# Patient Record
Sex: Female | Born: 1950 | Hispanic: No | Smoking: Former smoker
Health system: Southern US, Community
[De-identification: ages and names within clinical notes are randomized; demographics above are authoritative.]

## PROBLEM LIST (undated history)

## (undated) DIAGNOSIS — G459 Transient cerebral ischemic attack, unspecified: Secondary | ICD-10-CM

## (undated) DIAGNOSIS — K589 Irritable bowel syndrome without diarrhea: Secondary | ICD-10-CM

## (undated) DIAGNOSIS — I10 Essential (primary) hypertension: Secondary | ICD-10-CM

## (undated) DIAGNOSIS — E119 Type 2 diabetes mellitus without complications: Secondary | ICD-10-CM

## (undated) DIAGNOSIS — J45909 Unspecified asthma, uncomplicated: Secondary | ICD-10-CM

## (undated) HISTORY — DX: Transient cerebral ischemic attack, unspecified: G45.9

## (undated) HISTORY — PX: EXPLORATORY LAPAROTOMY: SUR591

---

## 2015-03-20 ENCOUNTER — Ambulatory Visit: Payer: Self-pay | Attending: Internal Medicine

## 2015-04-08 ENCOUNTER — Encounter (HOSPITAL_COMMUNITY): Payer: Self-pay

## 2015-04-08 ENCOUNTER — Emergency Department (HOSPITAL_COMMUNITY): Payer: Self-pay

## 2015-04-08 ENCOUNTER — Emergency Department (HOSPITAL_COMMUNITY)
Admission: EM | Admit: 2015-04-08 | Discharge: 2015-04-08 | Disposition: A | Discharge status: Home / Self Care | Payer: Self-pay | Attending: Emergency Medicine | Admitting: Emergency Medicine

## 2015-04-08 DIAGNOSIS — G44019 Episodic cluster headache, not intractable: Secondary | ICD-10-CM | POA: Insufficient documentation

## 2015-04-08 DIAGNOSIS — Z79899 Other long term (current) drug therapy: Secondary | ICD-10-CM | POA: Insufficient documentation

## 2015-04-08 DIAGNOSIS — R519 Headache, unspecified: Secondary | ICD-10-CM

## 2015-04-08 DIAGNOSIS — E119 Type 2 diabetes mellitus without complications: Secondary | ICD-10-CM | POA: Insufficient documentation

## 2015-04-08 DIAGNOSIS — R200 Anesthesia of skin: Secondary | ICD-10-CM

## 2015-04-08 DIAGNOSIS — R51 Headache: Secondary | ICD-10-CM

## 2015-04-08 DIAGNOSIS — I10 Essential (primary) hypertension: Secondary | ICD-10-CM | POA: Insufficient documentation

## 2015-04-08 DIAGNOSIS — Z7982 Long term (current) use of aspirin: Secondary | ICD-10-CM | POA: Insufficient documentation

## 2015-04-08 DIAGNOSIS — Z8719 Personal history of other diseases of the digestive system: Secondary | ICD-10-CM | POA: Insufficient documentation

## 2015-04-08 DIAGNOSIS — Z7951 Long term (current) use of inhaled steroids: Secondary | ICD-10-CM | POA: Insufficient documentation

## 2015-04-08 HISTORY — DX: Type 2 diabetes mellitus without complications: E11.9

## 2015-04-08 HISTORY — DX: Irritable bowel syndrome, unspecified: K58.9

## 2015-04-08 HISTORY — DX: Essential (primary) hypertension: I10

## 2015-04-08 LAB — COMPREHENSIVE METABOLIC PANEL
ALT: 27 U/L (ref 14–54)
AST: 23 U/L (ref 15–41)
Albumin: 4.8 g/dL (ref 3.5–5.0)
Alkaline Phosphatase: 56 U/L (ref 38–126)
Anion gap: 9 (ref 5–15)
BUN: 15 mg/dL (ref 6–20)
CHLORIDE: 107 mmol/L (ref 101–111)
CO2: 23 mmol/L (ref 22–32)
CREATININE: 0.88 mg/dL (ref 0.44–1.00)
Calcium: 9.9 mg/dL (ref 8.9–10.3)
GFR calc Af Amer: >60 mL/min (ref >60)
GFR calc non Af Amer: >60 mL/min (ref >60)
GLUCOSE: 100 mg/dL — AB (ref 65–99)
POTASSIUM: 4 mmol/L (ref 3.5–5.1)
Sodium: 139 mmol/L (ref 135–145)
TOTAL PROTEIN: 8.5 g/dL — AB (ref 6.5–8.1)
Total Bilirubin: 0.6 mg/dL (ref 0.3–1.2)

## 2015-04-08 LAB — CBC WITH DIFFERENTIAL/PLATELET
BASOS ABS: 0 10*3/uL (ref 0.0–0.1)
BASOS PCT: 1 % (ref 0–1)
EOS PCT: 1 % (ref 0–5)
Eosinophils Absolute: 0 10*3/uL (ref 0.0–0.7)
HCT: 34.2 % — ABNORMAL LOW (ref 36.0–46.0)
Hemoglobin: 11.3 g/dL — ABNORMAL LOW (ref 12.0–15.0)
Lymphocytes Relative: 47 % — ABNORMAL HIGH (ref 12–46)
Lymphs Abs: 2.3 10*3/uL (ref 0.7–4.0)
MCH: 28.5 pg (ref 26.0–34.0)
MCHC: 33 g/dL (ref 30.0–36.0)
MCV: 86.4 fL (ref 78.0–100.0)
MONOS PCT: 9 % (ref 3–12)
Monocytes Absolute: 0.5 10*3/uL (ref 0.1–1.0)
NEUTROS ABS: 2.1 10*3/uL (ref 1.7–7.7)
Neutrophils Relative %: 42 % — ABNORMAL LOW (ref 43–77)
PLATELETS: 238 10*3/uL (ref 150–400)
RBC: 3.96 MIL/uL (ref 3.87–5.11)
RDW: 13.9 % (ref 11.5–15.5)
WBC: 5 10*3/uL (ref 4.0–10.5)

## 2015-04-08 LAB — CBG MONITORING, ED: Glucose-Capillary: 105 mg/dL — ABNORMAL HIGH (ref 65–99)

## 2015-04-08 MED ORDER — METOCLOPRAMIDE HCL 5 MG/ML IJ SOLN
10.0000 mg | Freq: Once | INTRAMUSCULAR | Status: AC
  Administered 2015-04-08: 10 mg via INTRAVENOUS
  Filled 2015-04-08: qty 2

## 2015-04-08 MED ORDER — DIPHENHYDRAMINE HCL 50 MG/ML IJ SOLN
25.0000 mg | Freq: Once | INTRAMUSCULAR | Status: AC
  Administered 2015-04-08: 25 mg via INTRAVENOUS
  Filled 2015-04-08: qty 1

## 2015-04-08 MED ORDER — LORAZEPAM 2 MG/ML IJ SOLN
1.0000 mg | Freq: Once | INTRAMUSCULAR | Status: AC
  Administered 2015-04-08: 1 mg via INTRAVENOUS
  Filled 2015-04-08: qty 1

## 2015-04-08 MED ORDER — NAPROXEN 500 MG PO TABS: 500.0000 mg | ORAL_TABLET | Freq: Two times a day (BID) | ORAL | Status: DC

## 2015-04-08 MED ORDER — KETOROLAC TROMETHAMINE 60 MG/2ML IM SOLN
60.0000 mg | Freq: Once | INTRAMUSCULAR | Status: AC
  Administered 2015-04-08: 60 mg via INTRAMUSCULAR
  Filled 2015-04-08: qty 2

## 2015-04-08 MED ORDER — SODIUM CHLORIDE 0.9 % IV BOLUS (SEPSIS)
1000.0000 mL | Freq: Once | INTRAVENOUS | Status: AC
  Administered 2015-04-08: 1000 mL via INTRAVENOUS

## 2015-04-08 MED ORDER — SUMATRIPTAN SUCCINATE 25 MG PO TABS: 50.0000 mg | ORAL_TABLET | ORAL | Status: DC | PRN

## 2015-04-08 NOTE — Discharge Instructions (Signed)
Please call your doctor for a followup appointment within 24-48 hours. When you talk to your doctor please let them know that you were seen in the emergency department and have them acquire all of your records so that they can discuss the findings with you and formulate a treatment plan to fully care for your new and ongoing problems.  MRI normal   Emergency Department Resource Guide 1) Find a Doctor and Pay Out of Pocket Although you won't have to find out who is covered by your insurance plan, it is a good idea to ask around and get recommendations. You will then need to call the office and see if the doctor you have chosen will accept you as a new patient and what types of options they offer for patients who are self-pay. Some doctors offer discounts or will set up payment plans for their patients who do not have insurance, but you will need to ask so you aren't surprised when you get to your appointment.  2) Contact Your Local Health Department Not all health departments have doctors that can see patients for sick visits, but many do, so it is worth a call to see if yours does. If you don't know where your local health department is, you can check in your phone book. The CDC also has a tool to help you locate your state's health department, and many state websites also have listings of all of their local health departments.  3) Find a Walk-in Clinic If your illness is not likely to be very severe or complicated, you may want to try a walk in clinic. These are popping up all over the country in pharmacies, drugstores, and shopping centers. They're usually staffed by nurse practitioners or physician assistants that have been trained to treat common illnesses and complaints. They're usually fairly quick and inexpensive. However, if you have serious medical issues or chronic medical problems, these are probably not your best option.  No Primary Care Doctor: - Call Health Connect at  [phone] - they  can help you locate a primary care doctor that  accepts your insurance, provides certain services, etc. - Physician Referral Service- [phone]  Chronic Pain Problems: Organization         Address  Phone   Notes  [facility]  [phone] Patients need to be referred by their primary care doctor.   Medication Assistance: Organization         Address  Phone   Notes  [facility] [address] [place], [place] [address] [phone] --Must be a resident of [facility] [place] -- Must have NO insurance coverage whatsoever (no Medicaid/ Medicare, etc.) -- The pt. MUST have a primary care doctor that directs their care regularly and follows them in the community   MedAssist  [phone]   [employer]  [phone]    Agencies that provide inexpensive medical care: Organization         Address  Phone   Notes  [facility]  [phone]   [facility]    [phone]   [facility] [address] [place], [place] [address] [phone]   [facility] [address], [place] [phone]) [phone]   Planned Parenthood    [phone]   [facility]    [phone]   [facility]  [address], [place]  Phone:  [phone], Fax:  [phone] Hours of Operation:  9 am - 6 pm, M-F.  Also accepts Medicaid/Medicare and self-pay.  [facility] for [address], [address], [place] Phone: [phone], Fax: [phone]. Hours of Operation:  8:30 am - 5:30 pm, M-F.  Also accepts Medicaid and self-pay.  [facility] [place] [address] [place] Phone: [phone]   [place], [place], [place] [phone], Ext. [phone] Mondays & Thursdays: 7-9 AM.  First 15 patients are seen on a first come, first serve basis.    [place]erry Providers:  Organization         Address  Phone   Notes  [facility] [address], [place] [phone] Also accepts self-pay patients.  [facility] [address], [place]  [phone]   [place], [address], [place] [phone]   [facility] [address], [place] [phone]   Lucianne Lei [address], [place]   [phone] Only accepts [place] Access [place] patients after they have their name applied to their card.   Self-Pay (no insurance) in [facility]:  Organization         Address  Phone   Notes  Sickle Cell Patients, [facility] [place] [phone]   [facility] [place] [phone]   [facility] [place]  [place], [place], [place] [phone]   [facility]/Dr. Osei-Bonsu  [address], [place] or [address], [place] [phone] Phone number for both [place] and [place] locations is the same.  [facility]gent [facility] and [facility] [address] [place] [phone]   [facility] [address] [place] or [address] [phone]   [facility] [address] [place] [phone], phone; [phone], fax Sees patients 1st and 3rd Saturday of every month.  Must not qualify for public or private insurance (i.e. Medicaid, Medicare, [place] Health Choice, Veterans' Benefits)  Household income should be no more than 200% of the poverty level The clinic cannot treat you if you are pregnant or think you are pregnant  Sexually transmitted diseases are not treated at the clinic.    Dental Care: Organization         Address  Phone  Notes  [facility] Department of [facility] [place] [phone] Accepts children up to age 68 who are enrolled in [place] or [place]; pregnant women with a Medicaid card; and children who have applied for Medicaid or [place] Health Choice, but were declined, whose parents can pay a reduced fee at time of service.  [facility]  [address], [place] [phone] Accepts children up to age 16 who are enrolled in [place] or [place]; pregnant women with a Medicaid card; and children who have applied for Medicaid or [place] Health Choice, but were declined, whose parents can pay a reduced fee at time of service.  [facility]  [place] [phone] Patients are seen by appointment  only. Walk-ins are not accepted. [place] will see patients 72 years of age and older. Monday - Tuesday (8am-5pm) Most Wednesdays (8:30-5pm) $30 per visit, cash only  [facility] Access PROGRAM  [address], [facility] [phone] Patients are seen by appointment only. Walk-ins are not accepted. [place] will see patients 100 years of age and older. One Wednesday Evening (Monthly: Volunteer Based).  $30 per visit, cash only  [place]  [phone] for adults; Children under age 32, call Graduate Pediatric Dentistry at [phone]. Children aged 94-14, please call [phone] to request a pediatric application.  Dental services are provided in all areas of dental care including fillings, crowns and bridges, complete and partial dentures, implants, gum treatment, root canals, and extractions. Preventive care is also provided. Treatment is provided to both adults and children. Patients are selected via a lottery and there is often a waiting list.   [facility] [address] [place]  [phone] [email or web address]   [facility] [address] [place], [place] [phone], Ext. [phone] Second and Fourth Thursday of each month, opens at 6:30 AM; Clinic ends at 9 AM.  Patients are seen on a first-come first-served basis, and a limited number are seen during each clinic.   [facility]  [address] Hillard Danker [place], [place] [phone]   Eligibility Requirements You must have lived in [place], [place], or [place] counties for at least the last three months.   You cannot be eligible for state or federal sponsored [employer], including [employer], [place], or [employer].   You generally cannot be eligible for healthcare insurance through your employer.    How to apply: Eligibility screenings are held every Tuesday and Wednesday afternoon from 1:00 pm until 4:00 pm. You do not need an appointment for the interview!  [facility] [address] [place], [place]   [employer]  [facility] Department  [place]  [phone]    Behavioral Health Resources in the Community: Intensive Outpatient Programs Organization         Address  Phone  Notes  [facility] [place]. [address], [place], [place] [phone]   [facility] Outpatient [address], [place], [place]   ADS: Alcohol & Drug Svcs [address] [place], [place]   [place] [address],  [place], [place] or [phone]   Substance Abuse Resources Organization         Address  Phone  Notes  Alcohol and Drug Services  [phone]   [place]  [phone]   [facility] [place]   Chinita Pester  [phone]   Residential & Outpatient Substance Abuse Program  [phone]   Psychological Services Organization         Address  Phone  Notes  [facility] [place]   [place]  [phone]   [place] [address] [place] [phone] or [phone]    Mobile Crisis Teams Organization         Address  Phone  Notes  [facility], Mobile Crisis Care Unit  [phone]   [facility]ive Psychotherapeutic Services  [address] [place], [place]   [facility] [address], [place] [phone]  Self-Help/Support Groups Organization         Address  Phone             Notes  [facility]ental Health Assoc[facility] of [place] - variety of support groups  [place] Call for more information  Narcotics Anonymous (NA), [facility] [address], [employer] [place]  2 meetings at this location   Special educational needs teacher         Address  Phone  Notes  [facility] Residential Treatment [place],    [place]  [phone]   [facility]  [address], [place] [phone], [place], [place]   [place], [place] [phone] Admissions: 8am-3pm M-F  Incentives Substance [place] [address],    [employer], [place] [phone]   [facility] [address] [place], [place], [place]   [facility] [address]  [place], [place]   [facility] Programs - Intensive Outpatient [address], [place], [place]   [facility] ([place] [place].,  [place], [place] [phone] or [phone]   [facility]idential Treatment Services (RTS) [address], [place], [place] Accepts Medicaid  [facility] [place] [address],  [place] [phone] Substance Abuse/Addiction Treatment   [facility] Organization         Address  Phone  Notes  [facility]  [phone]   Domenic Schwab, PhD [address] [place], [place]   [phone] or  [phone]   [place], [place] [phone]   [facility] [address], [place], [place] [phone] Insurance/Medicaid/sponsorship through [facility] and Families [address] [place]                                    [place], [place] [phone] [place] [address], [place] [phone]    Dr. Adele Schilder  [phone]   [facility] Dept. 1) [address], [place] 2) [place] 3)  [place] [address], [place] [phone]  [phone]   [place] [phone] or [phone] (After Hours)

## 2015-04-08 NOTE — ED Notes (Signed)
Per pt, started noticing left sided weakness.  Leg/hand/face.  Pt states pain in left big toe.  Speech is clear.  Facial symmetry noted.  Slight decrease in left arm strength.  Pt also c/o headache.  Pt takes meds for vertigo.

## 2015-04-08 NOTE — ED Provider Notes (Signed)
CSN: [id number]     Arrival date & time 04/08/15  1431 History   First MD Initiated Contact with Patient 04/08/15 1545     Chief Complaint  Patient presents with  . Numbness  . 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 Today. Has headache which is also common for the pt .  Denies cough, sob, abd pain, neck pain, diarrhea or dysuria.   These sx started yesterday and have been constant.  Patient is a 64 y.o. female presenting with extremity pain. The history is provided by the patient.  Extremity Pain    Past Medical History  Diagnosis Date  . Diabetes mellitus without complication   . Hypertension   . IBS (irritable bowel syndrome)    Past Surgical History  Procedure Laterality Date  . Cesarean section    . Exploratory laparotomy     History reviewed. No pertinent family history. History  Substance Use Topics  . Smoking status: Never Smoker   . Smokeless tobacco: Not on file  . Alcohol Use: Yes     Comment: social   OB History    No data available     Review of Systems  All other systems reviewed and are negative.     Allergies  Peanuts; Shrimp; Latex; and Other  Home Medications   Prior to Admission medications   Medication Sig Start Date End Date Taking? Authorizing Provider  ascorbic Acid (VITAMIN C) 500 MG CPCR Take 500 mg by mouth daily.   Yes Historical  Provider, MD  aspirin 81 MG tablet Take 81 mg by mouth daily.   Yes Historical Provider, MD  Fish Oil-Cholecalciferol (FISH OIL + D3) 1000-1000 MG-UNIT CAPS Take 1 tablet by mouth daily.   Yes Historical Provider, MD  fluticasone (FLONASE) 50 MCG/ACT nasal spray Place 1 spray into both nostrils daily.   Yes Historical Provider, MD  losartan (COZAAR) 25 MG tablet Take 25 mg by mouth 2 (two) times daily.   Yes Historical Provider, MD  meclizine (ANTIVERT) 25 MG tablet Take 25 mg by mouth every 8 (eight) hours as needed for dizziness.   Yes Historical Provider, MD  Melatonin 5 MG TABS Take 5 mg by mouth at bedtime.   Yes Historical Provider, MD  metFORMIN (GLUCOPHAGE) 500 MG tablet Take 1,000 mg by mouth 2 (two) times daily with a meal.   Yes Historical Provider, MD  naproxen (NAPROSYN) 500 MG tablet Take 1 tablet (500 mg total) by mouth 2 (two) times daily with a meal. 04/08/15   Eber HongBrian Ether Goebel, MD  SUMAtriptan (IMITREX) 25 MG tablet Take 2 tablets (50 mg total) by mouth every 2 (two) hours as needed for migraine (ongoing headache). Maximum daily dose 200mg  04/08/15   Eber HongBrian Marianna Cid, MD   BP 111/86 mmHg  Pulse 65  Temp(Src) 98.2 F (36.8 C) (Oral)  Resp 18  Ht 5\' 7"  (1.702 m)  Wt 190 lb (86.183 kg)  BMI 29.75 kg/m2  SpO2 96% Physical Exam  Constitutional: She appears well-developed and well-nourished. No distress.  HENT:  Head: Normocephalic and atraumatic.  Mouth/Throat: Oropharynx is clear and moist. No oropharyngeal exudate.  Eyes: Conjunctivae and EOM are normal. Pupils are equal, round, and reactive to light. Right eye exhibits no discharge. Left eye exhibits no discharge. No scleral icterus.  Neck: Normal range of motion. Neck supple. No JVD present. No thyromegaly present.  Cardiovascular: Normal rate, regular rhythm, normal heart sounds and intact distal pulses.  Exam reveals no gallop and no friction rub.   No murmur heard. Pulmonary/Chest: Effort normal and breath sounds normal. No  respiratory distress. She has no wheezes. She has no rales.  Abdominal: Soft. Bowel sounds are normal. She exhibits no distension and no mass. There is no tenderness.  Musculoskeletal: Normal range of motion. She exhibits no edema or tenderness.  Lymphadenopathy:    She has no cervical adenopathy.  Neurological: She is alert. Coordination normal.  Normal strength of all major muscle groups and grips.  Has decreased sensation of the L arm and leg in stocking glove distribution.  Normal coordination, speecha nd CN 3-12.  Skin: Skin is warm and dry. No rash noted. No erythema.  Psychiatric: She has a normal mood and affect. Her behavior is normal.  Nursing note and vitals reviewed.   ED Course  Procedures (including critical care time) Labs Review Labs Reviewed  CBC WITH DIFFERENTIAL/PLATELET - Abnormal; Notable for the following:    Hemoglobin 11.3 (*)    HCT 34.2 (*)    Neutrophils Relative % 42 (*)    Lymphocytes Relative 47 (*)    All other components within normal limits  COMPREHENSIVE METABOLIC PANEL - Abnormal; Notable for the following:    Glucose, Bld 100 (*)    Total Protein 8.5 (*)    All other components within normal limits  CBG MONITORING, ED - Abnormal; Notable for the following:    Glucose-Capillary 105 (*)    All other components within normal limits    Imaging Review Ct Head Wo Contrast  04/08/2015   CLINICAL DATA:  64 year old female with left-sided weakness and left great toe pain.  EXAM: CT HEAD WITHOUT CONTRAST  TECHNIQUE: Contiguous axial images were obtained from the base of the skull through the vertex without intravenous contrast.  COMPARISON:  None.  FINDINGS: Negative for acute intracranial hemorrhage, acute infarction, mass, mass effect, hydrocephalus or midline shift. Gray-white differentiation is preserved throughout. No acute soft tissue or calvarial abnormality. The globes and orbits are symmetric and unremarkable. Normal aeration of the mastoid air  cells and visualized paranasal sinuses.  IMPRESSION: Negative head CT.   Electronically Signed   By: Malachy Moan M.D.   On: 04/08/2015 15:38   Mr Brain Wo Contrast  04/08/2015   CLINICAL DATA:  Acute onset of weakness of the left leg, hand and face. Headache.  EXAM: MRI HEAD WITHOUT CONTRAST  TECHNIQUE: Multiplanar, multiecho pulse sequences of the brain and surrounding structures were obtained without intravenous contrast.  COMPARISON:  Head CT same day  FINDINGS: There chronic small-vessel ischemic changes of the pons. 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 The cerebral hemispheres show mild chronic small-vessel ischemic changes within the deep white matter. There are old lacunar infarctions in the right caudate head. No cortical or large vessel territory infarction. No mass lesion, hemorrhage, hydrocephalus or extra-axial collection. No pituitary mass. Sinuses, middle ears and mastoids  are clear. Major vessels at the base of the brain show flow.  IMPRESSION: No acute finding. Chronic small-vessel ischemic changes affecting the pons, cerebellum, basal ganglia and cerebral hemispheric deep white matter.   Electronically Signed   By: Paulina Fusi M.D.   On: 04/08/2015 18:47      MDM   Final diagnoses:  Numbness  Nonintractable episodic headache, unspecified headache type    Consider CVA - also could be migrainous related - has had multipel imaging studies in the past.  CT neg, labs pending, tx headache, d/w neurology.  D/w Neuro Dr. Hosie Poisson who recommends MRI to r/o CVA - then migraine tx if neg.  MRI neg, pt informed much better with meds, agreeable to f/u.  Meds given in ED:  Medications  ketorolac (TORADOL) injection 60 mg (60 mg Intramuscular Given 04/08/15 1544)  sodium chloride 0.9 % bolus 1,000 mL (0 mLs Intravenous Stopped 04/08/15 1741)  metoCLOPramide (REGLAN) injection 10 mg (10 mg Intravenous Given 04/08/15 1606)  diphenhydrAMINE (BENADRYL) injection 25 mg  (25 mg Intravenous Given 04/08/15 1606)  LORazepam (ATIVAN) injection 1 mg (1 mg Intravenous Given 04/08/15 1748)    New Prescriptions   NAPROXEN (NAPROSYN) 500 MG TABLET    Take 1 tablet (500 mg total) by mouth 2 (two) times daily with a meal.   SUMATRIPTAN (IMITREX) 25 MG TABLET    Take 2 tablets (50 mg total) by mouth every 2 (two) hours as needed for migraine (ongoing headache). Maximum daily dose       Eber Hong, MD 04/08/15 Ernestina [place]

## 2015-04-08 NOTE — ED Provider Notes (Signed)
MSE was initiated and I personally evaluated the patient and placed orders (if any) at  3:14 PM on April 08, 2015.  The patient appears stable so that the remainder of the MSE may be completed by another provider.   Julie HongBrian Alta Goding, MD 04/08/15 [phone]

## 2015-05-13 ENCOUNTER — Other Ambulatory Visit: Payer: Self-pay

## 2015-05-13 ENCOUNTER — Encounter: Payer: Self-pay | Admitting: Family Medicine

## 2015-05-13 ENCOUNTER — Ambulatory Visit (INDEPENDENT_AMBULATORY_CARE_PROVIDER_SITE_OTHER): Payer: Self-pay | Admitting: Family Medicine

## 2015-05-13 VITALS — BP 184/102 | HR 67 | Temp 98.4°F | Resp 16 | Ht 67.0 in | Wt 195.0 lb

## 2015-05-13 DIAGNOSIS — F32A Depression, unspecified: Secondary | ICD-10-CM | POA: Insufficient documentation

## 2015-05-13 DIAGNOSIS — E1149 Type 2 diabetes mellitus with other diabetic neurological complication: Secondary | ICD-10-CM

## 2015-05-13 DIAGNOSIS — I152 Hypertension secondary to endocrine disorders: Secondary | ICD-10-CM

## 2015-05-13 DIAGNOSIS — E1159 Type 2 diabetes mellitus with other circulatory complications: Secondary | ICD-10-CM

## 2015-05-13 DIAGNOSIS — I10 Essential (primary) hypertension: Secondary | ICD-10-CM | POA: Insufficient documentation

## 2015-05-13 DIAGNOSIS — G43909 Migraine, unspecified, not intractable, without status migrainosus: Secondary | ICD-10-CM

## 2015-05-13 DIAGNOSIS — Z0000 Encounter for general adult medical examination without abnormal findings: Secondary | ICD-10-CM

## 2015-05-13 DIAGNOSIS — E118 Type 2 diabetes mellitus with unspecified complications: Secondary | ICD-10-CM

## 2015-05-13 DIAGNOSIS — G43009 Migraine without aura, not intractable, without status migrainosus: Secondary | ICD-10-CM

## 2015-05-13 DIAGNOSIS — K589 Irritable bowel syndrome without diarrhea: Secondary | ICD-10-CM | POA: Insufficient documentation

## 2015-05-13 DIAGNOSIS — F329 Major depressive disorder, single episode, unspecified: Secondary | ICD-10-CM

## 2015-05-13 DIAGNOSIS — G4700 Insomnia, unspecified: Secondary | ICD-10-CM | POA: Insufficient documentation

## 2015-05-13 DIAGNOSIS — Z8669 Personal history of other diseases of the nervous system and sense organs: Secondary | ICD-10-CM | POA: Insufficient documentation

## 2015-05-13 DIAGNOSIS — M25569 Pain in unspecified knee: Secondary | ICD-10-CM

## 2015-05-13 HISTORY — DX: Type 2 diabetes mellitus with other circulatory complications: E11.59

## 2015-05-13 HISTORY — DX: Migraine, unspecified, not intractable, without status migrainosus: G43.909

## 2015-05-13 HISTORY — DX: Type 2 diabetes mellitus with other diabetic neurological complication: E11.49

## 2015-05-13 HISTORY — DX: Hypertension secondary to endocrine disorders: I15.2

## 2015-05-13 HISTORY — DX: Insomnia, unspecified: G47.00

## 2015-05-13 LAB — COMPLETE METABOLIC PANEL WITH GFR
ALBUMIN: 3.8 g/dL (ref 3.6–5.1)
ALK PHOS: 53 U/L (ref 33–130)
ALT: 17 U/L (ref 6–29)
AST: 15 U/L (ref 10–35)
BILIRUBIN TOTAL: 0.4 mg/dL (ref 0.2–1.2)
BUN: 13 mg/dL (ref 7–25)
CALCIUM: 9.5 mg/dL (ref 8.6–10.4)
CO2: 21 mmol/L (ref 20–31)
Chloride: 106 mmol/L (ref 98–110)
Creat: 0.84 mg/dL (ref 0.50–0.99)
GFR, Est African American: 86 mL/min (ref >=60)
GFR, Est Non African American: 75 mL/min (ref >=60)
Glucose, Bld: 109 mg/dL — ABNORMAL HIGH (ref 65–99)
POTASSIUM: 4.4 mmol/L (ref 3.5–5.3)
Sodium: 140 mmol/L (ref 135–146)
Total Protein: 6.9 g/dL (ref 6.1–8.1)

## 2015-05-13 LAB — URIC ACID: Uric Acid, Serum: 7.8 mg/dL — ABNORMAL HIGH (ref 2.4–7.0)

## 2015-05-13 LAB — LIPID PANEL
CHOL/HDL RATIO: 4.9 ratio (ref <=5.0)
CHOLESTEROL: 264 mg/dL — AB (ref 125–200)
HDL: 54 mg/dL (ref >=46)
LDL Cholesterol: 180 mg/dL — ABNORMAL HIGH (ref <130)
Triglycerides: 148 mg/dL (ref <150)
VLDL: 30 mg/dL (ref <30)

## 2015-05-13 LAB — C-REACTIVE PROTEIN: CRP: <0.5 mg/dL (ref <0.60)

## 2015-05-13 LAB — TSH: TSH: 1.045 u[IU]/mL (ref 0.350–4.500)

## 2015-05-13 MED ORDER — SUMATRIPTAN SUCCINATE 25 MG PO TABS: 50.0000 mg | ORAL_TABLET | ORAL | Status: DC | PRN

## 2015-05-13 MED ORDER — MECLIZINE HCL 25 MG PO TABS: 25.0000 mg | ORAL_TABLET | Freq: Three times a day (TID) | ORAL | Status: DC | PRN

## 2015-05-13 MED ORDER — NAPROXEN 500 MG PO TABS: 500.0000 mg | ORAL_TABLET | Freq: Two times a day (BID) | ORAL | Status: DC

## 2015-05-13 MED ORDER — METFORMIN HCL 500 MG PO TABS: 1000.0000 mg | ORAL_TABLET | Freq: Two times a day (BID) | ORAL | Status: DC

## 2015-05-13 MED ORDER — TRAZODONE HCL 50 MG PO TABS: 25.0000 mg | ORAL_TABLET | Freq: Every evening | ORAL | Status: DC | PRN

## 2015-05-13 MED ORDER — LOSARTAN POTASSIUM 25 MG PO TABS: 25.0000 mg | ORAL_TABLET | Freq: Two times a day (BID) | ORAL | Status: DC

## 2015-05-13 MED ORDER — MELATONIN 5 MG PO TABS: 5.0000 mg | ORAL_TABLET | Freq: Every day | ORAL | Status: DC

## 2015-05-13 MED ORDER — OXYBUTYNIN CHLORIDE ER 10 MG PO TB24: 10.0000 mg | ORAL_TABLET | Freq: Every day | ORAL | Status: DC

## 2015-05-13 MED ORDER — FLUTICASONE PROPIONATE 50 MCG/ACT NA SUSP: 1.0000 | Freq: Every day | NASAL | Status: DC

## 2015-05-13 MED ORDER — LOSARTAN POTASSIUM 25 MG PO TABS
25.0000 mg | ORAL_TABLET | Freq: Two times a day (BID) | ORAL | Status: DC
Start: 2015-05-13 — End: 2015-09-24

## 2015-05-13 NOTE — Patient Instructions (Signed)
Get restarted on medication. Come back for nurse visit for BP check in 2 weeks. You can make an appointment at [facility] to be evaluated for depression treatment. We will let you know if your bloodwork shows anything that needs attention. Make an appointment for and get a mammogram Schedule an appointment for one month for BP check and PAP smear and pneumonia shot.

## 2015-05-13 NOTE — Progress Notes (Signed)
Patient ID: Julie Gilbert, female   DOB: [birth year], 64 y.o.   MRN: [id number]   Graciemae Delisle, is a 64 y.o. female  [id number]  [id number]  DOB - 04-Feb-1951  CC:  Chief Complaint  Patient presents with  . Establish Care    needs refills on meds        HPI: Julie Gilbert is a 64 y.o. female here to establish care and needing refills on chronic medications. She also c/o insomnia and would like something to help her sleep. She reports some depressive symtpoms. Her mother and sister both suffer with Bipolar Depression. She has a history of hypertension and is prescribed losartan 25 mg. She has a history of diabetes, type II and is on metformin 1000 bid. She also has been diagnosed with IBS and occassional vertigo. He last c/o if of bilateral knee pain and hand pain with swelling of fingers. Allergies  Allergen Reactions  . Peanuts [Peanut Oil] Anaphylaxis  . Shrimp [Shellfish Allergy] Anaphylaxis  . Latex Hives  . Other Hives and Itching   Past Medical History  Diagnosis Date  . Diabetes mellitus without complication   . Hypertension   . IBS (irritable bowel syndrome)    Current Outpatient Prescriptions on File Prior to Visit  Medication Sig Dispense Refill  . ascorbic Acid (VITAMIN C) 500 MG CPCR Take 500 mg by mouth daily.    Marland Kitchen aspirin 81 MG tablet Take 81 mg by mouth daily.    . Fish Oil-Cholecalciferol (FISH OIL + D3) 1000-1000 MG-UNIT CAPS Take 1 tablet by mouth daily.    . fluticasone (FLONASE) 50 MCG/ACT nasal spray Place 1 spray into both nostrils daily.    Marland Kitchen losartan (COZAAR) 25 MG tablet Take 25 mg by mouth 2 (two) times daily.    . meclizine (ANTIVERT) 25 MG tablet Take 25 mg by mouth every 8 (eight) hours as needed for dizziness.    . Melatonin 5 MG TABS Take 5 mg by mouth at bedtime.    . metFORMIN (GLUCOPHAGE) 500 MG tablet Take 1,000 mg by mouth 2 (two) times daily with a meal.    . naproxen (NAPROSYN) 500 MG tablet Take 1 tablet (500 mg total) by mouth 2  (two) times daily with a meal. (Patient not taking: Reported on 05/13/2015) 30 tablet 0  . SUMAtriptan (IMITREX) 25 MG tablet Take 2 tablets (50 mg total) by mouth every 2 (two) hours as needed for migraine (ongoing headache). Maximum daily dose  (Patient not taking: Reported on 05/13/2015) 30 tablet 0   No current facility-administered medications on file prior to visit.   Family History  Problem Relation Age of Onset  . Kidney failure Mother   . Heart disease Father   . Cancer Maternal Grandfather    Social History   Social History  . Marital Status: Unknown    Spouse Name: N/A  . Number of Children: N/A  . Years of Education: N/A   Occupational History  . Not on file.   Social History Main Topics  . Smoking status: Former Games developer  . Smokeless tobacco: Not on file  . Alcohol Use: 1.2 oz/week    2 Glasses of wine per week     Comment: social  . Drug Use: No  . Sexual Activity: Not on file   Other Topics Concern  . Not on file   Social History Narrative    Review of Systems: Constitutional: Negative for fever, chills, appetite change, weight loss. Positive for fatigue and  some recent weight gain HENT: Negative for ear pain, ear discharge.nose bleeds. Admits to some nasal congestion year round Eyes: Negative for pain, discharge, redness, itching and visual disturbance. Wears glasses Neck: Negative for pain, stiffness Respiratory: Negative for cough, shortness of breath. Cardiovascular: Negative for chest pain, and leg swelling.Reports some palpitations Gastrointestinal: Negative for abdominal distention, abdominal pain, nausea, vomiting, diarrhea, constipations Genitourinary: Negative for dysuria, urgency, hematuria, flank pain. Positive for frequency, particularly nocturia. Musculoskeletal: Negative  arthralgia and gait problem.Negative for weakness.Positive for back, knee and hand pain with some swelling of fingers. Neurological: Negative for dizziness, tremors,  seizures, syncope,   light-headedness, numbness and headaches.  Hematological: Negative for easy bleeding. Feels she bruises easily Psychiatric/Behavioral: Positive for depression, anxiety, insomnia. negative decreased concentration, confusion    Objective:   Filed Vitals:   05/13/15 1055  BP: 187/98  Pulse: 67  Temp: 98.4 F (36.9 C)  Resp: 16    Physical Exam: Constitutional: Patient appears well-developed and well-nourished. No distress. HENT: Normocephalic, atraumatic, External right and left ear normal. Oropharynx is clear and moist.  Eyes: Conjunctivae and EOM are normal. PERRLA, no scleral icterus. Neck: Normal ROM. Neck supple. No lymphadenopathy, No thyromegaly. CVS: RRR, S1/S2 +, no murmurs, no gallops, no rubs Pulmonary: Effort and breath sounds normal, no stridor, rhonchi, wheezes, rales.  Abdominal: Soft. Normoactive BS,, no distension, tenderness, rebound or guarding.  Musculoskeletal: Normal range of motion. No edema and no tenderness.  Neuro: Alert.Normal muscle tone coordination. Non-focal Skin: Skin is warm and dry. No rash noted. Not diaphoretic. No erythema. No pallor. Psychiatric: Normal mood and affect. Behavior, judgment, thought content normal.  Lab Results  Component Value Date   WBC 5.0 04/08/2015   HGB 11.3* 04/08/2015   HCT 34.2* 04/08/2015   MCV 86.4 04/08/2015   PLT 238 04/08/2015   Lab Results  Component Value Date   CREATININE 0.88 04/08/2015   BUN 15 04/08/2015   NA 139 04/08/2015   K 4.0 04/08/2015   CL 107 04/08/2015   CO2 23 04/08/2015    No results found for: HGBA1C Lipid Panel  No results found for: CHOL, TRIG, HDL, CHOLHDL, VLDL, LDLCALC     Assessment and plan:   Establish care -I have reviewed and discussed pertinent information provided by patient  Diabetes, controlled -continue metformin 1000 mg bid.   Hypertension, uncontrolled -Restart Losartan 25 mg #90 with 3 refills, one po q day. -Return for BP check with  nurse in one week.  Migraine Headaches -Refill of Imitrex 25 mg, 2 po q 2 hours, up to 200 mg daily.  Joint pain -ANS, RF, sed rate, crp, uric acid -refill of naproxen 500 mg, one po bid with food.  Insomnia -Trial of trazodone 50 mg at HS.     No Follow-up on file.  The patient was given clear instructions to go to ER or return to medical center if symptoms don't improve, worsen or new problems develop. The patient verbalized understanding. The patient was told to call to get lab results if they haven't heard anything in the next week.       Henrietta Hoover, MSN, FNP-BC   05/13/2015, 11:29 AM

## 2015-05-14 LAB — ANA: ANA: NEGATIVE

## 2015-05-14 LAB — SEDIMENTATION RATE: SED RATE: 10 mm/h (ref 0–30)

## 2015-05-16 LAB — VITAMIN D 1,25 DIHYDROXY
Vitamin D 1, 25 (OH)2 Total: 69 pg/mL (ref 18–72)
Vitamin D3 1, 25 (OH)2: 69 pg/mL

## 2015-05-19 ENCOUNTER — Telehealth: Payer: Self-pay

## 2015-05-19 ENCOUNTER — Ambulatory Visit (INDEPENDENT_AMBULATORY_CARE_PROVIDER_SITE_OTHER): Payer: Self-pay | Admitting: Family Medicine

## 2015-05-19 VITALS — BP 132/76 | HR 65 | Temp 98.2°F | Resp 18 | Ht 68.0 in | Wt 192.0 lb

## 2015-05-19 DIAGNOSIS — J209 Acute bronchitis, unspecified: Secondary | ICD-10-CM

## 2015-05-19 MED ORDER — HYDROCODONE-HOMATROPINE 5-1.5 MG/5ML PO SYRP
5.0000 mL | ORAL_SOLUTION | Freq: Three times a day (TID) | ORAL | Status: DC | PRN
Start: 2015-05-19 — End: 2015-12-14

## 2015-05-19 MED ORDER — AZITHROMYCIN 250 MG PO TABS: ORAL_TABLET | ORAL | Status: DC

## 2015-05-19 NOTE — Telephone Encounter (Signed)
-----   Message from Henrietta Hoover, NP sent at 05/19/2015  8:03 AM EDT ----- Vitamin D level ok. Cholesterol. I do not see a cholesterol medicine on her medication list. Is that correct? I do not see anything in labs to suggest an inflammatory process such as lupus or rheumatoid arthritis.

## 2015-05-19 NOTE — Telephone Encounter (Signed)
Left message for patient to return call regarding cholesterol. Thanks!

## 2015-05-19 NOTE — Patient Instructions (Signed)

## 2015-05-19 NOTE — Progress Notes (Signed)
Patient ID: Julie Gilbert MRN: [id number], DOB: 1951/03/28, 64 y.o. Date of Encounter: 05/19/2015, 8:15 PM  Primary Physician: No PCP Per Patient  Chief Complaint:  Chief Complaint  Patient presents with  . Cough    x 2 days    HPI: 64 y.o. year old female presents with a 5 day history of nasal congestion, post nasal drip, sore throat, and cough. Mild sinus pressure. Afebrile. No chills. Nasal congestion thick and green/yellow. Cough is productive of green/yellow sputum and not associated with time of day. Ears feel full, leading to sensation of muffled hearing. Has tried OTC cold preps without success. No GI complaints.   No sick contacts, recent antibiotics, or recent travels.   No leg trauma, sedentary periods, h/o cancer, or tobacco use.  Remote h/o asthma.  No known fever.  Nonproductive cough for the most part.  No shortness of breath or chest pain  Past Medical History  Diagnosis Date  . Diabetes mellitus without complication   . Hypertension   . IBS (irritable bowel syndrome)      Home Meds: Prior to Admission medications   Medication Sig Start Date End Date Taking? Authorizing Provider  ascorbic Acid (VITAMIN C) 500 MG CPCR Take 500 mg by mouth daily.   Yes Historical Provider, MD  aspirin 81 MG tablet Take 81 mg by mouth daily.   Yes Historical Provider, MD  Fish Oil-Cholecalciferol (FISH OIL + D3) 1000-1000 MG-UNIT CAPS Take 1 tablet by mouth daily.   Yes Historical Provider, MD  fluticasone (FLONASE) 50 MCG/ACT nasal spray Place 1 spray into both nostrils daily. 05/13/15  Yes Henrietta Hoover, NP  losartan (COZAAR) 25 MG tablet Take 1 tablet (25 mg total) by mouth 2 (two) times daily. 05/13/15  Yes Henrietta Hoover, NP  meclizine (ANTIVERT) 25 MG tablet Take 1 tablet (25 mg total) by mouth every 8 (eight) hours as needed for dizziness. 05/13/15  Yes Henrietta Hoover, NP  Melatonin 5 MG TABS Take 1 tablet (5 mg total) by mouth at bedtime. 05/13/15  Yes Henrietta Hoover, NP  metFORMIN (GLUCOPHAGE) 500 MG tablet Take 2 tablets (1,000 mg total) by mouth 2 (two) times daily with a meal. 05/13/15  Yes Henrietta Hoover, NP  Misc Natural Products (COLON CLEANSE PO) Take by mouth.   Yes Historical Provider, MD  naproxen (NAPROSYN) 500 MG tablet Take 1 tablet (500 mg total) by mouth 2 (two) times daily with a meal. 05/13/15  Yes Henrietta Hoover, NP  oxybutynin (DITROPAN XL) 10 MG 24 hr tablet Take 1 tablet (10 mg total) by mouth at bedtime. 05/13/15  Yes Henrietta Hoover, NP  SUMAtriptan (IMITREX) 25 MG tablet Take 2 tablets (50 mg total) by mouth every 2 (two) hours as needed for migraine (ongoing headache). Maximum daily dose 200mg  05/13/15  Yes Henrietta Hoover, NP  traZODone (DESYREL) 50 MG tablet Take 0.5-1 tablets (25-50 mg total) by mouth at bedtime as needed for sleep. 05/13/15  Yes Henrietta Hoover, NP    Allergies:  Allergies  Allergen Reactions  . Peanuts [Peanut Oil] Anaphylaxis  . Shrimp [Shellfish Allergy] Anaphylaxis  . Latex Hives  . Other Hives and Itching    Social History   Social History  . Marital Status: Unknown    Spouse Name: N/A  . Number of Children: N/A  . Years of Education: N/A   Occupational History  . Not on file.   Social History Main Topics  .  Smoking status: Former Games developer  . Smokeless tobacco: Not on file  . Alcohol Use: 1.2 oz/week    2 Glasses of wine per week     Comment: social  . Drug Use: No  . Sexual Activity: Not on file   Other Topics Concern  . Not on file   Social History Narrative     Review of Systems: Constitutional: negative for chills, fever, night sweats or weight changes Cardiovascular: negative for chest pain or palpitations Respiratory: negative for hemoptysis, wheezing, or shortness of breath Abdominal: negative for abdominal pain, nausea, vomiting or diarrhea Dermatological: negative for rash Neurologic: negative for headache   Physical Exam: Blood pressure 132/76, pulse  65, temperature 98.2 F (36.8 C), resp. rate 18, height  (1.727 m), weight 192 lb (87.091 kg), SpO2 98 %., Body mass index is 29.2 kg/(m^2). General: Well developed, well nourished, in no acute distress. Head: Normocephalic, atraumatic, eyes without discharge, sclera non-icteric, nares are congested. Bilateral auditory canals clear, TM's are without perforation, pearly grey with reflective cone of light bilaterally. No sinus TTP. Oral cavity moist, dentition normal. Posterior pharynx with post nasal drip and mild erythema. No peritonsillar abscess or tonsillar exudate. Neck: Supple. No thyromegaly. Full ROM. No lymphadenopathy. Lungs: Coarse breath sounds bilaterally without wheezes, rales, or rhonchi. Breathing is unlabored.  Heart: RRR with S1 S2. No murmurs, rubs, or gallops appreciated. Msk:  Strength and tone normal for age. Extremities: No clubbing or cyanosis. No edema. Neuro: Alert and oriented X 3. Moves all extremities spontaneously. CNII-XII grossly in tact. Psych:  Responds to questions appropriately with a normal affect.   Labs:   ASSESSMENT AND PLAN:  64 y.o. year old female with bronchitis. -   ICD-9-CM ICD-10-CM   1. Acute bronchitis, unspecified organism 466.0 J20.9 HYDROcodone-homatropine (HYCODAN) 5-1.5 MG/5ML syrup     azithromycin (ZITHROMAX) 250 MG tablet   -Tylenol/Motrin prn -Rest/fluids -RTC precautions -RTC 3-5 days if no improvement  Signed, Elvina Sidle, MD 05/19/2015 8:15 PM

## 2015-05-27 ENCOUNTER — Other Ambulatory Visit: Payer: Self-pay

## 2015-05-29 ENCOUNTER — Other Ambulatory Visit: Payer: Self-pay

## 2015-06-03 ENCOUNTER — Other Ambulatory Visit: Payer: Self-pay

## 2015-06-09 ENCOUNTER — Other Ambulatory Visit: Payer: Self-pay

## 2015-06-12 ENCOUNTER — Telehealth: Payer: Self-pay | Admitting: Family Medicine

## 2015-06-12 NOTE — Telephone Encounter (Signed)
The [facility] has a referral coordinator that can refer the patient to the dentist .

## 2015-06-12 NOTE — Telephone Encounter (Signed)
Patient has established care at [facility] and was recently seen by linda berndhart. Pt is wondering if you are able to refer her to see a dentist. She said she has another appt on Thursday at the [facility]. Please follow up with pt for advise. Thank you.

## 2015-06-18 ENCOUNTER — Encounter: Payer: Self-pay | Admitting: Family Medicine

## 2015-06-18 ENCOUNTER — Ambulatory Visit (INDEPENDENT_AMBULATORY_CARE_PROVIDER_SITE_OTHER): Payer: Self-pay | Admitting: Family Medicine

## 2015-06-18 VITALS — BP 143/90 | HR 62 | Temp 98.2°F | Resp 18 | Ht 67.0 in | Wt 193.0 lb

## 2015-06-18 DIAGNOSIS — K219 Gastro-esophageal reflux disease without esophagitis: Secondary | ICD-10-CM

## 2015-06-18 DIAGNOSIS — F329 Major depressive disorder, single episode, unspecified: Secondary | ICD-10-CM

## 2015-06-18 DIAGNOSIS — I10 Essential (primary) hypertension: Secondary | ICD-10-CM

## 2015-06-18 DIAGNOSIS — F32A Depression, unspecified: Secondary | ICD-10-CM

## 2015-06-18 DIAGNOSIS — G43009 Migraine without aura, not intractable, without status migrainosus: Secondary | ICD-10-CM

## 2015-06-18 DIAGNOSIS — E118 Type 2 diabetes mellitus with unspecified complications: Secondary | ICD-10-CM

## 2015-06-18 HISTORY — DX: Gastro-esophageal reflux disease without esophagitis: K21.9

## 2015-06-18 LAB — HEMOGLOBIN A1C
Hgb A1c MFr Bld: 6.5 % — ABNORMAL HIGH (ref <5.7)
Mean Plasma Glucose: 140 mg/dL — ABNORMAL HIGH (ref <117)

## 2015-06-18 LAB — POCT URINALYSIS DIP (DEVICE)
BILIRUBIN URINE: NEGATIVE
Glucose, UA: NEGATIVE mg/dL
HGB URINE DIPSTICK: NEGATIVE
Ketones, ur: NEGATIVE mg/dL
Nitrite: NEGATIVE
Protein, ur: NEGATIVE mg/dL
UROBILINOGEN UA: 0.2 mg/dL (ref 0.0–1.0)
pH: 5.5 (ref 5.0–8.0)

## 2015-06-18 MED ORDER — BUTALBITAL-APAP-CAFFEINE 50-325-40 MG PO TABS: 1.0000 | ORAL_TABLET | Freq: Four times a day (QID) | ORAL | Status: DC | PRN

## 2015-06-18 MED ORDER — OMEPRAZOLE 20 MG PO CPDR
20.0000 mg | DELAYED_RELEASE_CAPSULE | Freq: Every day | ORAL | Status: DC
Start: 2015-06-18 — End: 2015-09-24

## 2015-06-18 MED ORDER — SUMATRIPTAN SUCCINATE 25 MG PO TABS: 50.0000 mg | ORAL_TABLET | Freq: Once | ORAL | Status: DC

## 2015-06-18 NOTE — Progress Notes (Signed)
Subjective:    Patient ID: Julie Gilbert, female    DOB: 1951-07-06, 64 y.o.   MRN: [id number]  HPI Julie Gilbert, a 64 year old female with a history of migraine headaches, type 2 diabetes, hypertension, and depression presents for a follow-up of chronic diseases. Patient is not following a lowfat, low carbohydrate diet or exercise regimen.   Evaluation to date has been included: fasting lipid panel and hemoglobin A1C. Patient does not check blood sugars at home. . She reports that she consistently takes medications. She reports a history of IBS that fluctuates between constipation and diarrhea depending on diet.  Patient also here for follow-up of elevated blood pressure.  She does not check blood pressure at home. Patient denies chest pain, dyspnea, fatigue, lower extremity edema, orthopnea, palpitations, syncope and tachypnea.    She is also complaining of migraine headaches. She states that she has been out of imitrex for several days. Patient has not identified triggers for migraine headaches. She maintains that headache pain intensity is 4/10 bilaterally. She states that headaches are worsened by light and sound. She currently denies dizziness, syncope, or nausea. She states that she has not attempted and OTC interventions to alleviate current symptoms.   Past Medical History  Diagnosis Date  . Diabetes mellitus without complication   . Hypertension   . IBS (irritable bowel syndrome)      Medication List       This list is accurate as of: 06/18/15 11:59 PM.  Always use your most recent med list.               ascorbic Acid 500 MG Cpcr  Commonly known as:  VITAMIN C  Take 500 mg by mouth daily.     aspirin 81 MG tablet  Take 81 mg by mouth daily.     butalbital-acetaminophen-caffeine 50-325-40 MG per tablet  Commonly known as:  FIORICET  Take 1 tablet by mouth every 6 (six) hours as needed for headache.     COLON CLEANSE PO  Take by mouth.     FISH OIL + D3  1000-1000 MG-UNIT Caps  Take 1 tablet by mouth daily.     fluticasone 50 MCG/ACT nasal spray  Commonly known as:  FLONASE  Place 1 spray into both nostrils daily.     HYDROcodone-homatropine 5-1.5 MG/5ML syrup  Commonly known as:  HYCODAN  Take 5 mLs by mouth every 8 (eight) hours as needed for cough.     losartan 25 MG tablet  Commonly known as:  COZAAR  Take 1 tablet (25 mg total) by mouth 2 (two) times daily.     meclizine 25 MG tablet  Commonly known as:  ANTIVERT  Take 1 tablet (25 mg total) by mouth every 8 (eight) hours as needed for dizziness.     Melatonin 5 MG Tabs  Take 1 tablet (5 mg total) by mouth at bedtime.     metFORMIN 500 MG tablet  Commonly known as:  GLUCOPHAGE  Take 2 tablets (1,000 mg total) by mouth 2 (two) times daily with a meal.     naproxen 500 MG tablet  Commonly known as:  NAPROSYN  Take 1 tablet (500 mg total) by mouth 2 (two) times daily with a meal.     omeprazole 20 MG capsule  Commonly known as:  PRILOSEC  Take 1 capsule (20 mg total) by mouth daily.     oxybutynin 10 MG 24 hr tablet  Commonly known as:  DITROPAN XL  Take 1 tablet (10 mg total) by mouth at bedtime.     SUMAtriptan 25 MG tablet  Commonly known as:  IMITREX  Take 2 tablets (50 mg total) by mouth once. Maximum daily dose      traZODone 50 MG tablet  Commonly known as:  DESYREL  Take 0.5-1 tablets (25-50 mg total) by mouth at bedtime as needed for sleep.         Social History   Social History  . Marital Status: Unknown    Spouse Name: N/A  . Number of Children: N/A  . Years of Education: N/A   Occupational History  . Not on file.   Social History Main Topics  . Smoking status: Former Games developer  . Smokeless tobacco: Not on file  . Alcohol Use: 1.2 oz/week    2 Glasses of wine per week     Comment: social  . Drug Use: No  . Sexual Activity: Not on file   Other Topics Concern  . Not on file   Social History Narrative   Review of Systems   Constitutional: Positive for fatigue. Negative for fever and unexpected weight change.  Eyes: Negative.   Respiratory: Negative.   Cardiovascular: Negative.   Gastrointestinal: Positive for constipation.       Heartburn  Endocrine: Negative.  Negative for polydipsia, polyphagia and polyuria.  Musculoskeletal: Negative.   Skin: Negative.   Allergic/Immunologic: Negative.  Negative for immunocompromised state.  Neurological: Positive for headaches. Negative for dizziness.  Hematological: Negative.   Psychiatric/Behavioral: Positive for agitation. Negative for behavioral problems and decreased concentration.       Reports depression on most days       Objective:   Physical Exam  Constitutional: [name], place, and time. She appears well-developed and well-nourished.  HENT:  Head: Normocephalic and atraumatic.  Right Ear: External ear normal.  Left Ear: External ear normal.  Mouth/Throat: Oropharynx is clear and moist.  Eyes: Conjunctivae and EOM are normal. Pupils are equal, round, and reactive to light.  Neck: Normal range of motion. Neck supple.  Cardiovascular: Normal rate, regular rhythm, normal heart sounds and intact distal pulses.   Pulmonary/Chest: Effort normal and breath sounds normal.  Abdominal: Soft. Bowel sounds are normal.  Musculoskeletal: Normal range of motion.  Neurological: She is alert and oriented to person, place, and time. She has normal strength and normal reflexes. No cranial nerve deficit or sensory deficit.  Skin: Skin is warm and dry.  Psychiatric: Her behavior is normal. Judgment and thought content normal. She exhibits a depressed mood.         BP 143/90 mmHg  Pulse 62  Temp(Src) 98.2 F (36.8 C) (Oral)  Resp 18  Wt 193 lb (87.544 kg)  SpO2 100% Assessment & Plan:   1. Essential hypertension [name] is currently above goal on current medication regimen. Will continue current mediation regimen. The patient is asked to  make an attempt to improve diet and exercise patterns to aid in medical management of this problem. Will check for proteinuria. Reviewed previous laboratory results.  - POCT urinalysis dipstick - POCT urinalysis dip (device)  2. Nonintractable migraine, unspecified migraine type Recommend that Ms. Gongaware maintain a headache diary with headache exacerbations.   - butalbital-acetaminophen-caffeine (FIORICET) 50-325-40 MG per tablet; Take 1 tablet by mouth every 6 (six) hours as needed for headache.  Dispense: 30 tablet; Refill: 0 - SUMAtriptan (IMITREX) 25 MG tablet; Take 2 tablets (50 mg total)  by mouth once. Maximum daily dose   Dispense: 10 tablet; Refill: 0  3. Type 2 diabetes mellitus with complication Recommend a lowfat, low carbohydrate diet divided over 5-6 small meals, increase water intake to 6-8 glasses, and 150 minutes per week of cardiovascular exercise.   - Hemoglobin A1c  4. Gastroesophageal reflux disease without esophagitis Patient is complaining of dyspepsia. Discussed diet at length. Will start a 6 week trial of omeprazole. Will follow up in office in 6 weeks.  - omeprazole (PRILOSEC) 20 MG capsule; Take 1 capsule (20 mg total) by mouth daily.  Dispense: 42 capsule; Refill: 0   5. Depression Reviewed PHQ-9, score is 4. She states that she has feelings of anhedonia and hopelessness. She states that she does not have suicidal or homicidal intent. Will send referral for psychology.  - Ambulatory referral to Psychology    The patient was given clear instructions to go to ER or return to medical center if symptoms do not improve, worsen or new problems develop. The patient verbalized understanding. Will notify patient with laboratory results. Massie Maroon, FNP

## 2015-06-18 NOTE — Patient Instructions (Addendum)
Will start Fiorcet for migraine headaches every 6 hours as needed for mild to moderate headache pain Keep a headache diary and bring to follow up appointment  Will notify patient with laboratory results Recommend a lowfat, low carbohydrate diet divided over 5-6 small meals, increase water intake to 6-8 glasses, and 150 minutes per week of cardiovascular exercise.  Migraine Headache A migraine headache is an intense, throbbing pain on one or both sides of your head. A migraine can last for 30 minutes to several hours. CAUSES  The exact cause of a migraine headache is not always known. However, a migraine may be caused when nerves in the brain become irritated and release chemicals that cause inflammation. This causes pain. Certain things may also trigger migraines, such as:  Alcohol.  Smoking.  Stress.  Menstruation.  Aged cheeses.  Foods or drinks that contain nitrates, glutamate, aspartame, or tyramine.  Lack of sleep.  Chocolate.  Caffeine.  Hunger.  Physical exertion.  Fatigue.  Medicines used to treat chest pain (nitroglycerine), birth control pills, estrogen, and some blood pressure medicines. SIGNS AND SYMPTOMS  Pain on one or both sides of your head.  Pulsating or throbbing pain.  Severe pain that prevents daily activities.  Pain that is aggravated by any physical activity.  Nausea, vomiting, or both.  Dizziness.  Pain with exposure to bright lights, loud noises, or activity.  General sensitivity to bright lights, loud noises, or smells. Before you get a migraine, you may get warning signs that a migraine is coming (aura). An aura may include:  Seeing flashing lights.  Seeing bright spots, halos, or zigzag lines.  Having tunnel vision or blurred vision.  Having feelings of numbness or tingling.  Having trouble talking.  Having muscle weakness. DIAGNOSIS  A migraine headache is often diagnosed based on:  Symptoms.  Physical exam.  A CT  scan or MRI of your head. These imaging tests cannot diagnose migraines, but they can help rule out other causes of headaches. TREATMENT Medicines may be given for pain and nausea. Medicines can also be given to help prevent recurrent migraines.  HOME CARE INSTRUCTIONS  Only take over-the-counter or prescription medicines for pain or discomfort as directed by your health care provider. The use of long-term narcotics is not recommended.  Lie down in a dark, quiet room when you have a migraine.  Keep a journal to find out what may trigger your migraine headaches. For example, write down:  What you eat and drink.  How much sleep you get.  Any change to your diet or medicines.  Limit alcohol consumption.  Quit smoking if you smoke.  Get 7-9 hours of sleep, or as recommended by your health care provider.  Limit stress.  Keep lights dim if bright lights bother you and make your migraines worse. SEEK IMMEDIATE MEDICAL CARE IF:   Your migraine becomes severe.  You have a fever.  You have a stiff neck.  You have vision loss.  You have muscular weakness or loss of muscle control.  You start losing your balance or have trouble walking.  You feel faint or pass out.  You have severe symptoms that are different from your first symptoms. MAKE SURE YOU:   Understand these instructions.  Will watch your condition.  Will get help right away if you are not doing well or get worse. Document Released: 09/12/2005 Document Revised: 01/27/2014 Document Reviewed: 05/20/2013 [facility] Patient Information 2015 [place], [place]. This information is not intended to replace advice given  to you by your health care provider. Make sure you discuss any questions you have with your health care provider. Diabetes and Exercise Exercising regularly is important. It is not just about losing weight. It has many health benefits, such as:  Improving your overall fitness, flexibility, and  endurance.  Increasing your bone density.  Helping with weight control.  Decreasing your body fat.  Increasing your muscle strength.  Reducing stress and tension.  Improving your overall health. People with diabetes who exercise gain additional benefits because exercise:  Reduces appetite.  Improves the body's use of blood sugar (glucose).  Helps lower or control blood glucose.  Decreases blood pressure.  Helps control blood lipids (such as cholesterol and triglycerides).  Improves the body's use of the hormone insulin by:  Increasing the body's insulin sensitivity.  Reducing the body's insulin needs.  Decreases the risk for heart disease because exercising:  Lowers cholesterol and triglycerides levels.  Increases the levels of good cholesterol (such as high-density lipoproteins [HDL]) in the body.  Lowers blood glucose levels. YOUR ACTIVITY PLAN  Choose an activity that you enjoy and set realistic goals. Your health care provider or diabetes educator can help you make an activity plan that works for you. Exercise regularly as directed by your health care provider. This includes:  Performing resistance training twice a week such as push-ups, sit-ups, lifting weights, or using resistance bands.  Performing 150 minutes of cardio exercises each week such as walking, running, or playing sports.  Staying active and spending no more than 90 minutes at one time being inactive. Even short bursts of exercise are good for you. Three 10-minute sessions spread throughout the day are just as beneficial as a single 30-minute session. Some exercise ideas include:  Taking the dog for a walk.  Taking the stairs instead of the elevator.  Dancing to your favorite song.  Doing an exercise video.  Doing your favorite exercise with a friend. RECOMMENDATIONS FOR EXERCISING WITH TYPE 1 OR TYPE 2 DIABETES   Check your blood glucose before exercising. If blood glucose levels are  greater than 240 mg/dL, check for urine ketones. Do not exercise if ketones are present.  Avoid injecting insulin into areas of the body that are going to be exercised. For example, avoid injecting insulin into:  The arms when playing tennis.  The legs when jogging.  Keep a record of:  Food intake before and after you exercise.  Expected peak times of insulin action.  Blood glucose levels before and after you exercise.  The type and amount of exercise you have done.  Review your records with your health care provider. Your health care provider will help you to develop guidelines for adjusting food intake and insulin amounts before and after exercising.  If you take insulin or oral hypoglycemic agents, watch for signs and symptoms of hypoglycemia. They include:  Dizziness.  Shaking.  Sweating.  Chills.  Confusion.  Drink plenty of water while you exercise to prevent dehydration or heat stroke. Body water is lost during exercise and must be replaced.  Talk to your health care provider before starting an exercise program to make sure it is safe for you. Remember, almost any type of activity is better than none. Document Released: 12/03/2003 Document Revised: 01/27/2014 Document Reviewed: 02/19/2013 [facility] Patient Information 2015 [place], [place]. This information is not intended to replace advice given to you by your health care provider. Make sure you discuss any questions you have with your health care provider.  Type 2 Diabetes Mellitus Type 2 diabetes mellitus, often simply referred to as type 2 diabetes, is a long-lasting (chronic) disease. In type 2 diabetes, the pancreas does not make enough insulin (a hormone), the cells are less responsive to the insulin that is made (insulin resistance), or both. Normally, insulin moves sugars from food into the tissue cells. The tissue cells use the sugars for energy. The lack of insulin or the lack of normal response to insulin causes  excess sugars to build up in the blood instead of going into the tissue cells. As a result, high blood sugar (hyperglycemia) develops. The effect of high sugar (glucose) levels can cause many complications. Type 2 diabetes was also previously called adult-onset diabetes, but it can occur at any age.  RISK FACTORS  A person is predisposed to developing type 2 diabetes if someone in the family has the disease and also has one or more of the following primary risk factors:  Overweight.  An inactive lifestyle.  A history of consistently eating high-calorie foods. Maintaining a normal weight and regular physical activity can reduce the chance of developing type 2 diabetes. SYMPTOMS  A person with type 2 diabetes may not show symptoms initially. The symptoms of type 2 diabetes appear slowly. The symptoms include:  Increased thirst (polydipsia).  Increased urination (polyuria).  Increased urination during the night (nocturia).  Weight loss. This weight loss may be rapid.  Frequent, recurring infections.  Tiredness (fatigue).  Weakness.  Vision changes, such as blurred vision.  Fruity smell to your breath.  Abdominal pain.  Nausea or vomiting.  Cuts or bruises which are slow to heal.  Tingling or numbness in the hands or feet. DIAGNOSIS Type 2 diabetes is frequently not diagnosed until complications of diabetes are present. Type 2 diabetes is diagnosed when symptoms or complications are present and when blood glucose levels are increased. Your blood glucose level may be checked by one or more of the following blood tests:  A fasting blood glucose test. You will not be allowed to eat for at least 8 hours before a blood sample is taken.  A random blood glucose test. Your blood glucose is checked at any time of the day regardless of when you ate.  A hemoglobin A1c blood glucose test. A hemoglobin A1c test provides information about blood glucose control over the previous 3  months.  An oral glucose tolerance test (OGTT). Your blood glucose is measured after you have not eaten (fasted) for 2 hours and then after you drink a glucose-containing beverage. TREATMENT   You may need to take insulin or diabetes medicine daily to keep blood glucose levels in the desired range.  If you use insulin, you may need to adjust the dosage depending on the carbohydrates that you eat with each meal or snack. The treatment goal is to maintain the before meal blood sugar (preprandial glucose) level at 70-130 mg/dL. HOME CARE INSTRUCTIONS   Have your hemoglobin A1c level checked twice a year.  Perform daily blood glucose monitoring as directed by your health care provider.  Monitor urine ketones when you are ill and as directed by your health care provider.  Take your diabetes medicine or insulin as directed by your health care provider to maintain your blood glucose levels in the desired range.  Never run out of diabetes medicine or insulin. It is needed every day.  If you are using insulin, you may need to adjust the amount of insulin given based on your intake  of carbohydrates. Carbohydrates can raise blood glucose levels but need to be included in your diet. Carbohydrates provide vitamins, minerals, and fiber which are an essential part of a healthy diet. Carbohydrates are found in fruits, vegetables, whole grains, dairy products, legumes, and foods containing added sugars.  Eat healthy foods. You should make an appointment to see a registered dietitian to help you create an eating plan that is right for you.  Lose weight if you are overweight.  Carry a medical alert card or wear your medical alert jewelry.  Carry a 15-gram carbohydrate snack with you at all times to treat low blood glucose (hypoglycemia). Some examples of 15-gram carbohydrate snacks include:  Glucose tablets, 3 or 4.  Glucose gel, 15-gram tube.  Raisins, 2 tablespoons (24 grams).  Jelly beans,  6.  Animal crackers, 8.  Regular pop, 4 ounces (120 mL).  Gummy treats, 9.  Recognize hypoglycemia. Hypoglycemia occurs with blood glucose levels of 70 mg/dL and below. The risk for hypoglycemia increases when fasting or skipping meals, during or after intense exercise, and during sleep. Hypoglycemia symptoms can include:  Tremors or shakes.  Decreased ability to concentrate.  Sweating.  Increased heart rate.  Headache.  Dry mouth.  Hunger.  Irritability.  Anxiety.  Restless sleep.  Altered speech or coordination.  Confusion.  Treat hypoglycemia promptly. If you are alert and able to safely swallow, follow the 15:15 rule:  Take 15-20 grams of rapid-acting glucose or carbohydrate. Rapid-acting options include glucose gel, glucose tablets, or 4 ounces (120 mL) of fruit juice, regular soda, or low-fat milk.  Check your blood glucose level 15 minutes after taking the glucose.  Take 15-20 grams more of glucose if the repeat blood glucose level is still 70 mg/dL or below.  Eat a meal or snack within 1 hour once blood glucose levels return to normal.  Be alert to feeling very thirsty and urinating more frequently than usual, which are early signs of hyperglycemia. An early awareness of hyperglycemia allows for prompt treatment. Treat hyperglycemia as directed by your health care provider.  Engage in at least 150 minutes of moderate-intensity physical activity a week, spread over at least 3 days of the week or as directed by your health care provider. In addition, you should engage in resistance exercise at least 2 times a week or as directed by your health care provider. Try to spend no more than 90 minutes at one time inactive.  Adjust your medicine and food intake as needed if you start a new exercise or sport.  Follow your sick-day plan anytime you are unable to eat or drink as usual.  Do not use any tobacco products including cigarettes, chewing tobacco, or  electronic cigarettes. If you need help quitting, ask your health care provider.  Limit alcohol intake to no more than 1 drink per day for nonpregnant women and 2 drinks per day for men. You should drink alcohol only when you are also eating food. Talk with your health care provider whether alcohol is safe for you. Tell your health care provider if you drink alcohol several times a week.  Keep all follow-up visits as directed by your health care provider. This is important.  Schedule an eye exam soon after the diagnosis of type 2 diabetes and then annually.  Perform daily skin and foot care. Examine your skin and feet daily for cuts, bruises, redness, nail problems, bleeding, blisters, or sores. A foot exam by a health care provider should be done annually.  Brush your teeth and gums at least twice a day and floss at least once a day. Follow up with your dentist regularly.  Share your diabetes management plan with your workplace or school.  Stay up-to-date with immunizations. It is recommended that people with diabetes who are over 22 years old get the pneumonia vaccine. In some cases, two separate shots may be given. Ask your health care provider if your pneumonia vaccination is up-to-date.  Learn to manage stress.  Obtain ongoing diabetes education and support as needed.  Participate in or seek rehabilitation as needed to maintain or improve independence and quality of life. Request a physical or occupational therapy referral if you are having foot or hand numbness, or difficulties with grooming, dressing, eating, or physical activity. SEEK MEDICAL CARE IF:   You are unable to eat food or drink fluids for more than 6 hours.  You have nausea and vomiting for more than 6 hours.  Your blood glucose level is over 240 mg/dL.  There is a change in mental status.  You develop an additional serious illness.  You have diarrhea for more than 6 hours.  You have been sick or have had a fever  for a couple of days and are not getting better.  You have pain during any physical activity.  SEEK IMMEDIATE MEDICAL CARE IF:  You have difficulty breathing.  You have moderate to large ketone levels. MAKE SURE YOU:  Understand these instructions.  Will watch your condition.  Will get help right away if you are not doing well or get worse. Document Released: 09/12/2005 Document Revised: 01/27/2014 Document Reviewed: 04/10/2012 [facility] Patient Information 2015 [place], [place]. This information is not intended to replace advice given to you by your health care provider. Make sure you discuss any questions you have with your health care provider.

## 2015-06-22 ENCOUNTER — Telehealth: Payer: Self-pay

## 2015-06-22 NOTE — Telephone Encounter (Signed)
-----   Message from Massie Maroon, [place] sent at 06/21/2015 11:32 AM EDT ----- Please inform Ms. Allbright that her hemoglobin A1C is stable at 6.5%, will continue current medication regimen.   Have her follow up in 6 weeks as previously scheduled.  Inquire about migraine medication. Ensure that she is keeping a migraine headache diary.    Thanks

## 2015-06-22 NOTE — Telephone Encounter (Signed)
Called and spoke with patient about labs to continue medication regiment, keep headache diary, and to follow up in 6 weeks. Patient verbalized understanding and had no questions at this time. Thanks!

## 2015-08-12 ENCOUNTER — Encounter: Payer: Self-pay | Admitting: Family Medicine

## 2015-08-12 ENCOUNTER — Ambulatory Visit (INDEPENDENT_AMBULATORY_CARE_PROVIDER_SITE_OTHER): Payer: Self-pay | Admitting: Family Medicine

## 2015-08-12 VITALS — BP 132/98 | HR 64 | Temp 97.5°F | Resp 16 | Ht 67.0 in | Wt 189.0 lb

## 2015-08-12 DIAGNOSIS — F329 Major depressive disorder, single episode, unspecified: Secondary | ICD-10-CM

## 2015-08-12 DIAGNOSIS — E118 Type 2 diabetes mellitus with unspecified complications: Secondary | ICD-10-CM

## 2015-08-12 DIAGNOSIS — I10 Essential (primary) hypertension: Secondary | ICD-10-CM

## 2015-08-12 DIAGNOSIS — M2550 Pain in unspecified joint: Secondary | ICD-10-CM | POA: Insufficient documentation

## 2015-08-12 DIAGNOSIS — F32A Depression, unspecified: Secondary | ICD-10-CM

## 2015-08-12 LAB — POCT URINALYSIS DIP (DEVICE)
Bilirubin Urine: NEGATIVE
Glucose, UA: NEGATIVE mg/dL
Hgb urine dipstick: NEGATIVE
Ketones, ur: NEGATIVE mg/dL
Nitrite: NEGATIVE
Protein, ur: NEGATIVE mg/dL
Specific Gravity, Urine: 1.015 (ref 1.005–1.030)
Urobilinogen, UA: 0.2 mg/dL (ref 0.0–1.0)
pH: 5 (ref 5.0–8.0)

## 2015-08-12 LAB — RHEUMATOID FACTOR: Rhuematoid fact SerPl-aCnc: <10 IU/mL (ref <=14)

## 2015-08-12 LAB — COMPLETE METABOLIC PANEL WITH GFR
ALT: 22 U/L (ref 6–29)
AST: 30 U/L (ref 10–35)
Albumin: 4.5 g/dL (ref 3.6–5.1)
Alkaline Phosphatase: 64 U/L (ref 33–130)
BUN: 15 mg/dL (ref 7–25)
CO2: 23 mmol/L (ref 20–31)
Calcium: 9.5 mg/dL (ref 8.6–10.4)
Chloride: 104 mmol/L (ref 98–110)
Creat: 1.13 mg/dL — ABNORMAL HIGH (ref 0.50–0.99)
GFR, Est African American: 60 mL/min (ref >=60)
GFR, Est Non African American: 52 mL/min — ABNORMAL LOW (ref >=60)
Glucose, Bld: 72 mg/dL (ref 65–99)
Potassium: 4.4 mmol/L (ref 3.5–5.3)
Sodium: 138 mmol/L (ref 135–146)
Total Bilirubin: 0.5 mg/dL (ref 0.2–1.2)
Total Protein: 7.5 g/dL (ref 6.1–8.1)

## 2015-08-12 NOTE — Progress Notes (Signed)
Subjective:    Patient ID: Julie Gilbert, female    DOB: 02/22/1951, 64 y.o.   MRN: [id number] Julie Gilbert, a 64 year old female with a history of  type 2 diabetes, hypertension, and depression presents for a follow-up of chronic diseases. Patient is not following a lowfat, low carbohydrate diet or exercise regimen.   Evaluation to date has been included: fasting lipid panel and hemoglobin A1C. Patient does not check blood sugars at home. . She reports that she consistently takes medications. She reports a history of IBS that fluctuates between constipation and diarrhea depending on diet.  Patient also here for follow-up of elevated blood pressure.  She does not check blood pressure at home. Patient denies chest pain, dyspnea, fatigue, lower extremity edema, orthopnea, palpitations, syncope and tachypnea.    Diabetes She has type 2 diabetes mellitus. Her disease course has been improving. Hypoglycemia symptoms include headaches. Pertinent negatives for hypoglycemia include no dizziness or sweats. Associated symptoms include fatigue. Pertinent negatives for diabetes include no blurred vision, no chest pain, no foot ulcerations, no polydipsia, no polyphagia, no polyuria, no visual change, no weakness and no weight loss. Symptoms are stable. Risk factors for coronary artery disease include diabetes mellitus and sedentary lifestyle. Current diabetic treatment includes diet and oral agent (monotherapy). She is compliant with treatment all of the time. She is following a diabetic diet. When asked about meal planning, she reported none. She has not had a previous visit with a dietitian. She participates in exercise intermittently.  Hypertension This is a chronic problem. The current episode started more than 1 year ago. The problem has been gradually improving since onset. Associated symptoms include headaches. Pertinent negatives include no anxiety, blurred vision, chest pain, neck pain, orthopnea,  palpitations, peripheral edema, shortness of breath or sweats. There are no associated agents to hypertension. Risk factors for coronary artery disease include diabetes mellitus and sedentary lifestyle.    Past Medical History  Diagnosis Date  . Diabetes mellitus without complication (HCC)   . Hypertension   . IBS (irritable bowel syndrome)      Medication List       This list is accurate as of: 08/12/15 12:06 PM.  Always use your most recent med list.               ascorbic Acid 500 MG Cpcr  Commonly known as:  VITAMIN C  Take 500 mg by mouth daily.     aspirin 81 MG tablet  Take 81 mg by mouth daily.     butalbital-acetaminophen-caffeine 50-325-40 MG tablet  Commonly known as:  FIORICET  Take 1 tablet by mouth every 6 (six) hours as needed for headache.     COLON CLEANSE PO  Take by mouth.     FISH OIL + D3 1000-1000 MG-UNIT Caps  Take 1 tablet by mouth daily.     fluticasone 50 MCG/ACT nasal spray  Commonly known as:  FLONASE  Place 1 spray into both nostrils daily.     HYDROcodone-homatropine 5-1.5 MG/5ML syrup  Commonly known as:  HYCODAN  Take 5 mLs by mouth every 8 (eight) hours as needed for cough.     losartan 25 MG tablet  Commonly known as:  COZAAR  Take 1 tablet (25 mg total) by mouth 2 (two) times daily.     meclizine 25 MG tablet  Commonly known as:  ANTIVERT  Take 1 tablet (25 mg total) by mouth every 8 (eight) hours as needed for  dizziness.     Melatonin 5 MG Tabs  Take 1 tablet (5 mg total) by mouth at bedtime.     metFORMIN 500 MG tablet  Commonly known as:  GLUCOPHAGE  Take 2 tablets (1,000 mg total) by mouth 2 (two) times daily with a meal.     naproxen 500 MG tablet  Commonly known as:  NAPROSYN  Take 1 tablet (500 mg total) by mouth 2 (two) times daily with a meal.     omeprazole 20 MG capsule  Commonly known as:  PRILOSEC  Take 1 capsule (20 mg total) by mouth daily.     oxybutynin 10 MG 24 hr tablet  Commonly known as:   DITROPAN XL  Take 1 tablet (10 mg total) by mouth at bedtime.     SUMAtriptan 25 MG tablet  Commonly known as:  IMITREX  Take 2 tablets (50 mg total) by mouth once. Maximum daily dose      traZODone 50 MG tablet  Commonly known as:  DESYREL  Take 0.5-1 tablets (25-50 mg total) by mouth at bedtime as needed for sleep.         Social History   Social History  . Marital Status: Unknown    Spouse Name: N/A  . Number of Children: N/A  . Years of Education: N/A   Occupational History  . Not on file.   Social History Main Topics  . Smoking status: Former Games developer  . Smokeless tobacco: Not on file  . Alcohol Use: 1.2 oz/week    2 Glasses of wine per week     Comment: social  . Drug Use: No  . Sexual Activity: Not on file   Other Topics Concern  . Not on file   Social History Narrative   Review of Systems  Constitutional: Positive for fatigue. Negative for fever, weight loss and unexpected weight change.  Eyes: Negative.  Negative for blurred vision.  Respiratory: Negative.  Negative for shortness of breath.   Cardiovascular: Negative for chest pain, palpitations and orthopnea.  Gastrointestinal: Positive for constipation.       Heartburn  Endocrine: Negative.  Negative for polydipsia, polyphagia and polyuria.  Musculoskeletal: Negative.  Negative for neck pain.  Skin: Negative.   Allergic/Immunologic: Negative.  Negative for immunocompromised state.  Neurological: Positive for headaches. Negative for dizziness and weakness.  Hematological: Negative.   Psychiatric/Behavioral: Positive for agitation. Negative for behavioral problems and decreased concentration.       Reports depression on most days       Objective:   Physical Exam  Constitutional: [name], place, and time. She appears well-developed and well-nourished.  HENT:  Head: Normocephalic and atraumatic.  Right Ear: External ear normal.  Left Ear: External ear normal.  Mouth/Throat:  Oropharynx is clear and moist.  Eyes: Conjunctivae and EOM are normal. Pupils are equal, round, and reactive to light.  Neck: Normal range of motion. Neck supple.  Cardiovascular: Normal rate, regular rhythm, normal heart sounds and intact distal pulses.   Pulmonary/Chest: Effort normal and breath sounds normal.  Abdominal: Soft. Bowel sounds are normal.  Musculoskeletal: Normal range of motion.  Neurological: She is alert and oriented to person, place, and time. She has normal strength and normal reflexes. No cranial nerve deficit or sensory deficit.  Skin: Skin is warm and dry.  Psychiatric: Her behavior is normal. Judgment and thought content normal. She exhibits a depressed mood.         BP 132/98 mmHg  Pulse 64  Temp(Src) 97.5 F (36.4 C) (Oral)  Resp 16  Ht  (1.702 m)  Wt 189 lb (85.73 kg)  BMI 29.59 kg/m2 Assessment & Plan:  1. Essential hypertension Patient is at goal on current medication regimen. Will continue anti-hypertensive medications at current dosage.  - POCT urinalysis dipstick - COMPLETE METABOLIC PANEL WITH GFR  2. Type 2 diabetes mellitus with complication, without long-term current use of insulin (HCC) Previous hemoglobin A1C is <7%, which is at goal. Will re-check A1C on today.  - Hemoglobin A1c  3. Arthralgia Patient is complaining of joint pain to elbows and lower extremities. She states that she has not attempted OTC interventions to assist with pain. She reports a family history of rheumatoid arthritis, will check a rheumatoid factor. Also, reviewed previous sed rate and crp, within normal limits. Recommend Tylenol 500 mg every 6 hours as needed for mild to moderate joint pain.  - Rheumatoid factor  4. Depression She reports periodic depression. She denies suicidal or homicidal intent. Recommend that patient follow up with [facility] walk[facility].    RTC: 6 month for hypertension and diabetes Liisa Picone M, FNP

## 2015-08-12 NOTE — Patient Instructions (Signed)
[facility] 8[facility]-3pm  Recommend a lowfat, low carbohydrate diet divided over 5-6 small meals, increase water intake to 6-8 glasses, and 150 minutes per week of cardiovascular exercise.

## 2015-08-13 LAB — HEMOGLOBIN A1C
Hgb A1c MFr Bld: 7.2 % — ABNORMAL HIGH (ref <5.7)
MEAN PLASMA GLUCOSE: 160 mg/dL — AB (ref <117)

## 2015-08-14 ENCOUNTER — Telehealth: Payer: Self-pay

## 2015-08-14 NOTE — Telephone Encounter (Signed)
Left message for patient to call back.  Thanks!

## 2015-08-14 NOTE — Telephone Encounter (Signed)
-----   Message from Massie MaroonLachina M Hollis, [place] sent at 08/13/2015  5:03 PM EST ----- Please inform Ms. Dehn that hemoglobin A1C has increased to 7.2%. Recommend that patient take Metformin consistently. Inform patient to eat a low fat, low carbohydrate diet and exercise 3 times per week for 30 minutes. Rheumatoid factor is negative. Recommend high dose Tylenol 500 mg every 6 hours as needed for mild to moderate joint pain. High dose tylenol is the primary treatment for osteoarthritis. Follow up as previously scheduled.   Thanks ----- Message -----    From: [facility]    Sent: 08/13/2015   1:09 AM      To: Massie MaroonLachina M Hollis, FNP

## 2015-08-14 NOTE — Telephone Encounter (Signed)
Called patient advised of elevated hgba1c and to take metformin consistently. To eat low fat/ low carb diet, and to exercise 30 minutes 3 times weekly. Also advised patient to take tylenol 500mg  every 6 hours as needed for osteoarthritis. Patient verbalized understanding and will keep follow up appointment. Thanks!

## 2015-09-24 ENCOUNTER — Telehealth: Payer: Self-pay | Admitting: Family Medicine

## 2015-09-24 DIAGNOSIS — K219 Gastro-esophageal reflux disease without esophagitis: Secondary | ICD-10-CM

## 2015-09-24 DIAGNOSIS — G43009 Migraine without aura, not intractable, without status migrainosus: Secondary | ICD-10-CM

## 2015-09-24 MED ORDER — METFORMIN HCL 500 MG PO TABS: 1000.0000 mg | ORAL_TABLET | Freq: Two times a day (BID) | ORAL | Status: DC

## 2015-09-24 MED ORDER — TRAZODONE HCL 50 MG PO TABS: 25.0000 mg | ORAL_TABLET | Freq: Every evening | ORAL | Status: DC | PRN

## 2015-09-24 MED ORDER — BUTALBITAL-APAP-CAFFEINE 50-325-40 MG PO TABS: 1.0000 | ORAL_TABLET | Freq: Four times a day (QID) | ORAL | Status: DC | PRN

## 2015-09-24 MED ORDER — LOSARTAN POTASSIUM 25 MG PO TABS: 25.0000 mg | ORAL_TABLET | Freq: Two times a day (BID) | ORAL | Status: DC

## 2015-09-24 MED ORDER — OXYBUTYNIN CHLORIDE ER 10 MG PO TB24: 10.0000 mg | ORAL_TABLET | Freq: Every day | ORAL | Status: DC

## 2015-09-24 MED ORDER — SUMATRIPTAN SUCCINATE 25 MG PO TABS: 50.0000 mg | ORAL_TABLET | Freq: Once | ORAL | Status: DC

## 2015-09-24 MED ORDER — MECLIZINE HCL 25 MG PO TABS: 25.0000 mg | ORAL_TABLET | Freq: Three times a day (TID) | ORAL | Status: DC | PRN

## 2015-09-24 MED ORDER — FLUTICASONE PROPIONATE 50 MCG/ACT NA SUSP: 1.0000 | Freq: Every day | NASAL | Status: DC

## 2015-09-24 MED ORDER — OMEPRAZOLE 20 MG PO CPDR: 20.0000 mg | DELAYED_RELEASE_CAPSULE | Freq: Every day | ORAL | Status: DC

## 2015-09-24 NOTE — Telephone Encounter (Signed)
Patient came in and requested refills on med. They have been refilled and sent to pharmacy. Thanks!

## 2015-10-01 ENCOUNTER — Other Ambulatory Visit: Payer: Self-pay | Admitting: Family Medicine

## 2015-10-01 MED ORDER — MECLIZINE HCL 25 MG PO TABS: 25.0000 mg | ORAL_TABLET | Freq: Three times a day (TID) | ORAL | Status: DC | PRN

## 2015-10-01 NOTE — Telephone Encounter (Signed)
Refill for meclizine sent into pharmacy. Thanks!  

## 2015-11-05 ENCOUNTER — Encounter (HOSPITAL_COMMUNITY): Payer: Self-pay | Admitting: Emergency Medicine

## 2015-11-05 ENCOUNTER — Emergency Department (HOSPITAL_COMMUNITY)
Admission: EM | Admit: 2015-11-05 | Discharge: 2015-11-05 | Disposition: A | Discharge status: Home / Self Care | Payer: Self-pay | Attending: Emergency Medicine | Admitting: Emergency Medicine

## 2015-11-05 ENCOUNTER — Emergency Department (HOSPITAL_COMMUNITY): Payer: Self-pay

## 2015-11-05 DIAGNOSIS — J45909 Unspecified asthma, uncomplicated: Secondary | ICD-10-CM | POA: Insufficient documentation

## 2015-11-05 DIAGNOSIS — B349 Viral infection, unspecified: Secondary | ICD-10-CM | POA: Insufficient documentation

## 2015-11-05 DIAGNOSIS — Z7951 Long term (current) use of inhaled steroids: Secondary | ICD-10-CM | POA: Insufficient documentation

## 2015-11-05 DIAGNOSIS — E119 Type 2 diabetes mellitus without complications: Secondary | ICD-10-CM | POA: Insufficient documentation

## 2015-11-05 DIAGNOSIS — Z87891 Personal history of nicotine dependence: Secondary | ICD-10-CM | POA: Insufficient documentation

## 2015-11-05 DIAGNOSIS — I10 Essential (primary) hypertension: Secondary | ICD-10-CM | POA: Insufficient documentation

## 2015-11-05 DIAGNOSIS — Z91040 Latex allergy status: Secondary | ICD-10-CM | POA: Insufficient documentation

## 2015-11-05 DIAGNOSIS — Z7982 Long term (current) use of aspirin: Secondary | ICD-10-CM | POA: Insufficient documentation

## 2015-11-05 DIAGNOSIS — Z8719 Personal history of other diseases of the digestive system: Secondary | ICD-10-CM | POA: Insufficient documentation

## 2015-11-05 DIAGNOSIS — Z79899 Other long term (current) drug therapy: Secondary | ICD-10-CM | POA: Insufficient documentation

## 2015-11-05 DIAGNOSIS — Z7984 Long term (current) use of oral hypoglycemic drugs: Secondary | ICD-10-CM | POA: Insufficient documentation

## 2015-11-05 HISTORY — DX: Unspecified asthma, uncomplicated: J45.909

## 2015-11-05 MED ORDER — LORATADINE 10 MG PO TABS
10.0000 mg | ORAL_TABLET | Freq: Once | ORAL | Status: AC
  Administered 2015-11-05: 10 mg via ORAL
  Filled 2015-11-05: qty 1

## 2015-11-05 MED ORDER — FLUTICASONE PROPIONATE 50 MCG/ACT NA SUSP: 2.0000 | Freq: Every day | NASAL | Status: DC

## 2015-11-05 MED ORDER — GUAIFENESIN ER 1200 MG PO TB12: 1.0000 | ORAL_TABLET | Freq: Two times a day (BID) | ORAL | Status: DC

## 2015-11-05 MED ORDER — BENZONATATE 100 MG PO CAPS: 100.0000 mg | ORAL_CAPSULE | Freq: Three times a day (TID) | ORAL | Status: DC

## 2015-11-05 MED ORDER — BENZONATATE 100 MG PO CAPS
200.0000 mg | ORAL_CAPSULE | Freq: Once | ORAL | Status: AC
  Administered 2015-11-05: 200 mg via ORAL
  Filled 2015-11-05: qty 2

## 2015-11-05 NOTE — ED Provider Notes (Signed)
CSN: [id number]     Arrival date & time 11/05/15  0008 History   By signing my name below, I, Arlan Organ, attest that this documentation has been prepared under the direction and in the presence of Daylin Eads, MD.  Electronically Signed: Arlan Organ, ED Scribe. 11/05/2015. 12:54 AM.   Chief Complaint  Patient presents with  . Cough  . Fever   Patient is a 65 y.o. female presenting with cough. The history is provided by the patient. No language interpreter was used.  Cough Cough characteristics:  Unable to specify Severity:  Moderate Onset quality:  Gradual Duration:  2 days Timing:  Constant Progression:  Unchanged Chronicity:  New Smoker: no   Context: not sick contacts   Relieved by:  None tried Worsened by:  Lying down Ineffective treatments:  None tried Associated symptoms: sinus congestion   Associated symptoms: no chest pain, no ear pain, no fever, no shortness of breath and no wheezing   Associated symptoms comment:  3 diarrhea   HPI Comments: Julie Gilbert is a 65 y.o. female with a PMHx of DM and HTN who presents to the Emergency Department complaining of constant, ongoing cough x 2 days. Pt also reports congestion, fever, and diarrhea. 3 episodes of diarrhea reported since onset. Cough is made worse at night time. No alleviating factors at this time. No OTC medications or home remedies attempted prior to arrival. No recent fever, chills, nausea, vomiting, chest pain, or shortness of breath. No known sick contacts. She denies any prior history of same.  PCP: No PCP Per Patient    Past Medical History  Diagnosis Date  . Diabetes mellitus without complication (HCC)   . Hypertension   . IBS (irritable bowel syndrome)   . Asthma    Past Surgical History  Procedure Laterality Date  . Cesarean section    . Exploratory laparotomy     Family History  Problem Relation Age of Onset  . Kidney failure Mother   . Hypertension Mother   . Heart disease Father   .  Cancer Maternal Grandfather    Social History  Substance Use Topics  . Smoking status: Former Games developer  . Smokeless tobacco: None  . Alcohol Use: 1.2 oz/week    2 Glasses of wine per week     Comment: social   OB History    No data available     Review of Systems  Constitutional: Negative for fever.  HENT: Positive for congestion. Negative for ear pain, facial swelling, trouble swallowing and voice change.   Respiratory: Positive for cough. Negative for shortness of breath and wheezing.   Cardiovascular: Negative for chest pain.  Gastrointestinal: Positive for diarrhea. Negative for abdominal pain.  All other systems reviewed and are negative.     Allergies  Peanuts; Shrimp; Latex; and Other  Home Medications   Prior to Admission medications   Medication Sig Start Date End Date Taking? Authorizing Provider  ascorbic Acid (VITAMIN C) 500 MG CPCR Take 500 mg by mouth daily.    Historical Provider, MD  aspirin 81 MG tablet Take 81 mg by mouth daily.    Historical Provider, MD  butalbital-acetaminophen-caffeine (FIORICET) [phone] MG tablet Take 1 tablet by mouth every 6 (six) hours as needed for headache. 09/24/15 09/23/16  Massie Maroon, FNP  Fish Oil-Cholecalciferol (FISH OIL + D3) 1000-1000 MG-UNIT CAPS Take 1 tablet by mouth daily.    Historical Provider, MD  fluticasone (FLONASE) 50 MCG/ACT nasal spray Place 1 spray  into both nostrils daily. 09/24/15   Massie Maroon, FNP  HYDROcodone-homatropine (HYCODAN) 5-1.5 MG/5ML syrup Take 5 mLs by mouth every 8 (eight) hours as needed for cough. Patient not taking: Reported on 06/18/2015 05/19/15   Elvina Sidle, MD  losartan (COZAAR) 25 MG tablet Take 1 tablet (25 mg total) by mouth 2 (two) times daily. 09/24/15   Massie Maroon, FNP  meclizine (ANTIVERT) 25 MG tablet Take 1 tablet (25 mg total) by mouth every 8 (eight) hours as needed for dizziness. 10/01/15   Henrietta Hoover, NP  Melatonin 5 MG TABS Take 1 tablet (5 mg  total) by mouth at bedtime. 05/13/15   Henrietta Hoover, NP  metFORMIN (GLUCOPHAGE) 500 MG tablet Take 2 tablets (1,000 mg total) by mouth 2 (two) times daily with a meal. 09/24/15   Massie Maroon, FNP  Misc Natural Products (COLON CLEANSE PO) Take by mouth.    Historical Provider, MD  naproxen (NAPROSYN) 500 MG tablet Take 1 tablet (500 mg total) by mouth 2 (two) times daily with a meal. Patient not taking: Reported on 06/18/2015 05/13/15   Henrietta Hoover, NP  omeprazole (PRILOSEC) 20 MG capsule Take 1 capsule (20 mg total) by mouth daily. 09/24/15   Massie Maroon, FNP  oxybutynin (DITROPAN XL) 10 MG 24 hr tablet Take 1 tablet (10 mg total) by mouth at bedtime. 09/24/15   Massie Maroon, FNP  SUMAtriptan (IMITREX) 25 MG tablet Take 2 tablets (50 mg total) by mouth once. Maximum daily dose  09/24/15   Massie Maroon, FNP  traZODone (DESYREL) 50 MG tablet Take 0.5-1 tablets (25-50 mg total) by mouth at bedtime as needed for sleep. 09/24/15   Massie Maroon, FNP   Triage Vitals: BP 163/103 mmHg  Pulse 79  Temp(Src) 98.5 F (36.9 C) (Oral)  Resp 20  SpO2 100%   Physical Exam  Constitutional: [name], place, and time. She appears well-developed and well-nourished. No distress.  HENT:  Head: Normocephalic and atraumatic.  Mouth/Throat: Oropharynx is clear and moist. No oropharyngeal exudate.  Postnasal drip noted   Eyes: EOM are normal. Pupils are equal, round, and reactive to light.  Neck: Normal range of motion. Neck supple.  No carotid bruits  Trachea is midline   Cardiovascular: Normal rate, regular rhythm and normal heart sounds.   Pulmonary/Chest: Effort normal and breath sounds normal. No stridor. No respiratory distress. She has no wheezes. She has no rales. She exhibits no tenderness.  Abdominal: Soft. She exhibits no distension. There is no tenderness. There is no rebound and no guarding.  Stool throughout the transverse colon  Musculoskeletal:  Normal range of motion.  Lymphadenopathy:    She has no cervical adenopathy.  Neurological: She is alert and oriented to person, place, and time.  Skin: Skin is warm and dry.  Psychiatric: She has a normal mood and affect. Judgment normal.  Nursing note and vitals reviewed.   ED Course  Procedures (including critical care time)  DIAGNOSTIC STUDIES: Oxygen Saturation is 100% on RA, Normal by my interpretation.    COORDINATION OF CARE: 12:47 AM- Will order CXR. Will give Tessalon and Claritin. Discussed treatment plan with pt at bedside and pt agreed to plan.     Labs Review Labs Reviewed - No data to display  Imaging Review No results found. I have personally reviewed and evaluated these images as part of my medical decision-making.   EKG Interpretation None  MDM   Final diagnoses:  None    Well appearing diarrhea resolved.  No coughing in the ED.  No fever.  Will treat symptomatically.  Follow up with your PMD for recheck Monday.  Strict return precautions given  I personally performed the services described in this documentation, which was scribed in my presence. The recorded information has been reviewed and is accurate.     Cy Blamer, MD 11/05/15 [phone]

## 2015-11-05 NOTE — ED Notes (Signed)
Pt states she has been sick since Saturday with cough, congestion, and fever

## 2015-11-12 ENCOUNTER — Ambulatory Visit: Payer: Self-pay | Admitting: Family Medicine

## 2015-12-14 ENCOUNTER — Ambulatory Visit (INDEPENDENT_AMBULATORY_CARE_PROVIDER_SITE_OTHER): Payer: Self-pay | Admitting: Family Medicine

## 2015-12-14 ENCOUNTER — Telehealth: Payer: Self-pay

## 2015-12-14 VITALS — BP 163/83 | HR 64 | Temp 98.0°F | Resp 16 | Ht 67.0 in | Wt 195.0 lb

## 2015-12-14 DIAGNOSIS — H8110 Benign paroxysmal vertigo, unspecified ear: Secondary | ICD-10-CM

## 2015-12-14 DIAGNOSIS — E118 Type 2 diabetes mellitus with unspecified complications: Secondary | ICD-10-CM

## 2015-12-14 DIAGNOSIS — I10 Essential (primary) hypertension: Secondary | ICD-10-CM

## 2015-12-14 DIAGNOSIS — F32A Depression, unspecified: Secondary | ICD-10-CM

## 2015-12-14 DIAGNOSIS — K589 Irritable bowel syndrome without diarrhea: Secondary | ICD-10-CM

## 2015-12-14 DIAGNOSIS — F329 Major depressive disorder, single episode, unspecified: Secondary | ICD-10-CM

## 2015-12-14 LAB — COMPLETE METABOLIC PANEL WITH GFR
ALK PHOS: 70 U/L (ref 33–130)
ALT: 13 U/L (ref 6–29)
AST: 14 U/L (ref 10–35)
Albumin: 4.5 g/dL (ref 3.6–5.1)
BUN: 21 mg/dL (ref 7–25)
CO2: 23 mmol/L (ref 20–31)
Calcium: 9.5 mg/dL (ref 8.6–10.4)
Chloride: 104 mmol/L (ref 98–110)
Creat: 1.09 mg/dL — ABNORMAL HIGH (ref 0.50–0.99)
GFR, EST AFRICAN AMERICAN: 63 mL/min (ref >=60)
GFR, EST NON AFRICAN AMERICAN: 55 mL/min — AB (ref >=60)
Glucose, Bld: 146 mg/dL — ABNORMAL HIGH (ref 65–99)
POTASSIUM: 4.6 mmol/L (ref 3.5–5.3)
Sodium: 138 mmol/L (ref 135–146)
TOTAL PROTEIN: 7.7 g/dL (ref 6.1–8.1)
Total Bilirubin: 0.4 mg/dL (ref 0.2–1.2)

## 2015-12-14 LAB — POCT URINALYSIS DIP (DEVICE)
BILIRUBIN URINE: NEGATIVE
GLUCOSE, UA: NEGATIVE mg/dL
KETONES UR: NEGATIVE mg/dL
Nitrite: NEGATIVE
Protein, ur: NEGATIVE mg/dL
Specific Gravity, Urine: 1.015 (ref 1.005–1.030)
Urobilinogen, UA: 0.2 mg/dL (ref 0.0–1.0)
pH: 6 (ref 5.0–8.0)

## 2015-12-14 MED ORDER — MECLIZINE HCL 25 MG PO TABS: 25.0000 mg | ORAL_TABLET | Freq: Three times a day (TID) | ORAL | Status: DC | PRN

## 2015-12-14 MED ORDER — LINACLOTIDE 145 MCG PO CAPS: 145.0000 ug | ORAL_CAPSULE | Freq: Every day | ORAL | Status: DC

## 2015-12-14 NOTE — Progress Notes (Signed)
Subjective:    Patient ID: Julie NobleAnnie G Gilbert, female    DOB: [birth year], 65 y.o.   MRN: [id number]  Diabetes She has type 2 diabetes mellitus. Onset time: Previous hemoglobin a1C was 6.7  Her disease course has been improving. Hypoglycemia symptoms include headaches. Pertinent negatives for hypoglycemia include no dizziness or sweats. Associated symptoms include fatigue. Pertinent negatives for diabetes include no blurred vision, no chest pain, no foot ulcerations, no polydipsia, no polyphagia, no polyuria, no visual change, no weakness and no weight loss. Pertinent negatives for hypoglycemia complications include no blackouts and no hospitalization. Symptoms are stable. Pertinent negatives for diabetic complications include no retinopathy. Risk factors for coronary artery disease include diabetes mellitus and sedentary lifestyle. Current diabetic treatment includes diet and oral agent (monotherapy). She is compliant with treatment all of the time. She is following a diabetic diet. When asked about meal planning, she reported none. She has not had a previous visit with a dietitian. She participates in exercise intermittently.  Hypertension This is a chronic problem. The current episode started more than 1 year ago. The problem has been gradually improving since onset. Associated symptoms include headaches. Pertinent negatives include no anxiety, blurred vision, chest pain, neck pain, orthopnea, palpitations, peripheral edema, shortness of breath or sweats. There are no associated agents to hypertension. Risk factors for coronary artery disease include diabetes mellitus and sedentary lifestyle. There is no history of retinopathy.  Constipation This is a recurrent (Julie Gilbert reports a history of IBS) problem. The current episode started more than 1 year ago. The problem has been gradually worsening since onset. Her stool frequency is 2 to 3 times per week. The stool is described as formed. The patient is  not on a high fiber diet. She does not exercise regularly. There has been adequate water intake. Associated symptoms include abdominal pain, bloating and flatus. Pertinent negatives include no back pain, diarrhea, difficulty urinating, fever, hematochezia, hemorrhoids, melena, nausea, rectal pain, vomiting or weight loss. Risk factors include obesity. Treatments tried: Patient has been on Linzess in the past with maximum relief. Her past medical history is significant for irritable bowel syndrome.   Past Medical History  Diagnosis Date  . Diabetes mellitus without complication (HCC)   . Hypertension   . IBS (irritable bowel syndrome)   . Asthma      Medication List       This list is accurate as of: 12/14/15  1:25 PM.  Always use your most recent med list.               ascorbic Acid 500 MG Cpcr  Commonly known as:  VITAMIN C  Take 500 mg by mouth daily.     aspirin 81 MG tablet  Take 81 mg by mouth daily.     benzonatate 100 MG capsule  Commonly known as:  TESSALON  Take 1 capsule (100 mg total) by mouth every 8 (eight) hours.     butalbital-acetaminophen-caffeine 50-325-40 MG tablet  Commonly known as:  FIORICET  Take 1 tablet by mouth every 6 (six) hours as needed for headache.     COLON CLEANSE PO  Take by mouth.     FISH OIL + D3 1000-1000 MG-UNIT Caps  Take 1 tablet by mouth daily.     fluticasone 50 MCG/ACT nasal spray  Commonly known as:  FLONASE  Place 1 spray into both nostrils daily.     fluticasone 50 MCG/ACT nasal spray  Commonly known as:  FLONASE  Place  2 sprays into both nostrils daily.     Guaifenesin 1200 MG Tb12  Take 1 tablet (1,200 mg total) by mouth 2 (two) times daily.     HYDROcodone-homatropine 5-1.5 MG/5ML syrup  Commonly known as:  HYCODAN  Take 5 mLs by mouth every 8 (eight) hours as needed for cough.     losartan 25 MG tablet  Commonly known as:  COZAAR  Take 1 tablet (25 mg total) by mouth 2 (two) times daily.     meclizine 25 MG  tablet  Commonly known as:  ANTIVERT  Take 1 tablet (25 mg total) by mouth every 8 (eight) hours as needed for dizziness.     Melatonin 5 MG Tabs  Take 1 tablet (5 mg total) by mouth at bedtime.     metFORMIN 500 MG tablet  Commonly known as:  GLUCOPHAGE  Take 2 tablets (1,000 mg total) by mouth 2 (two) times daily with a meal.     naproxen 500 MG tablet  Commonly known as:  NAPROSYN  Take 1 tablet (500 mg total) by mouth 2 (two) times daily with a meal.     omeprazole 20 MG capsule  Commonly known as:  PRILOSEC  Take 1 capsule (20 mg total) by mouth daily.     oxybutynin 10 MG 24 hr tablet  Commonly known as:  DITROPAN XL  Take 1 tablet (10 mg total) by mouth at bedtime.     SUMAtriptan 25 MG tablet  Commonly known as:  IMITREX  Take 2 tablets (50 mg total) by mouth once. Maximum daily dose      traZODone 50 MG tablet  Commonly known as:  DESYREL  Take 0.5-1 tablets (25-50 mg total) by mouth at bedtime as needed for sleep.         Social History   Social History  . Marital Status: Unknown    Spouse Name: N/A  . Number of Children: N/A  . Years of Education: N/A   Occupational History  . Not on file.   Social History Main Topics  . Smoking status: Former Games developer  . Smokeless tobacco: Not on file  . Alcohol Use: 1.2 oz/week    2 Glasses of wine per week     Comment: social  . Drug Use: No  . Sexual Activity: Not on file   Other Topics Concern  . Not on file   Social History Narrative   Review of Systems  Constitutional: Positive for fatigue. Negative for fever, weight loss and unexpected weight change.  Eyes: Negative.  Negative for blurred vision.  Respiratory: Negative.  Negative for shortness of breath.   Cardiovascular: Negative for chest pain, palpitations and orthopnea.  Gastrointestinal: Positive for abdominal pain, constipation, bloating and flatus. Negative for nausea, vomiting, diarrhea, melena, hematochezia, rectal pain and hemorrhoids.        Heartburn  Endocrine: Negative.  Negative for polydipsia, polyphagia and polyuria.  Genitourinary: Negative for difficulty urinating.  Musculoskeletal: Negative.  Negative for back pain and neck pain.  Skin: Negative.   Allergic/Immunologic: Negative.  Negative for immunocompromised state.  Neurological: Positive for headaches. Negative for dizziness and weakness.  Hematological: Negative.   Psychiatric/Behavioral: Positive for agitation. Negative for behavioral problems and decreased concentration.       Reports depression on most days       Objective:   Physical Exam  Constitutional: [name], place, and time. She appears well-developed and well-nourished.  HENT:  Head: Normocephalic and atraumatic.  Right Ear: External ear normal.  Left Ear: External ear normal.  Mouth/Throat: Oropharynx is clear and moist.  Eyes: Conjunctivae and EOM are normal. Pupils are equal, round, and reactive to light.  Neck: Normal range of motion. Neck supple.  Cardiovascular: Normal rate, regular rhythm, normal heart sounds and intact distal pulses.   Pulmonary/Chest: Effort normal and breath sounds normal.  Abdominal: Soft. Bowel sounds are normal.  Musculoskeletal: Normal range of motion.  Neurological: She is alert and oriented to person, place, and time. She has normal strength and normal reflexes. No cranial nerve deficit or sensory deficit.  Skin: Skin is warm and dry.  Psychiatric: Her behavior is normal. Judgment and thought content normal. She exhibits a depressed mood.       BP 163/83 mmHg  Pulse 64  Temp(Src) 98 F (36.7 C) (Oral)  Resp 16  Ht  (1.702 m)  Wt 195 lb (88.451 kg)  BMI 30.53 kg/m2 Assessment & Plan:   1. Type 2 diabetes mellitus with complication, without long-term current use of insulin (HCC) There have been some probable non adherence to medication regimen. I have discussed with her the great importance of following the treatment plan  exactly as directed in order to achieve a good medical outcome. - POCT urinalysis dipstick - POCT urinalysis dip (device) - Hemoglobin A1c  2. Essential hypertension Blood pressure is currently above goal. She states that she has not taken medications on today.  - POCT urinalysis dipstick - COMPLETE METABOLIC PANEL WITH GFR - POCT urinalysis dip (device)  3. Benign paroxysmal positional vertigo, unspecified laterality - meclizine (ANTIVERT) 25 MG tablet; Take 1 tablet (25 mg total) by mouth every 8 (eight) hours as needed for dizziness.  Dispense: 30 tablet; Refill: 2  4. IBS (irritable bowel syndrome) Will start a trial of Linzess 145 mcg capsule daily. Also recommend a high fiber diet, increase water intake and exercise.   5. Depression She reports periodic depression. She denies suicidal or homicidal intent. Recommend that patient follow up with [facility] walk-in [facility].       RTC: 3 months for hypertension and depression    Hollis,Lachina M, FNP

## 2015-12-14 NOTE — Patient Instructions (Signed)
Diet for Irritable Bowel Syndrome  When you have irritable bowel syndrome (IBS), the foods you eat and your eating habits are very important. IBS may cause various symptoms, such as abdominal pain, constipation, or diarrhea. Choosing the right foods can help ease discomfort caused by these symptoms. Work with your health care provider and dietitian to find the best eating plan to help control your symptoms.  WHAT GENERAL GUIDELINES DO I NEED TO FOLLOW?  · Keep a food diary. This will help you identify foods that cause symptoms. Write down:  · What you eat and when.  · What symptoms you have.  · When symptoms occur in relation to your meals.  · Avoid foods that cause symptoms. Talk with your dietitian about other ways to get the same nutrients that are in these foods.  · Eat more foods that contain fiber. Take a fiber supplement if directed by your dietitian.  · Eat your meals slowly, in a relaxed setting.  · Aim to eat 5-6 small meals per day. Do not skip meals.  · Drink enough fluids to keep your urine clear or pale yellow.  · Ask your health care provider if you should take an over-the-counter probiotic during flare-ups to help restore healthy gut bacteria.  · If you have cramping or diarrhea, try making your meals low in fat and high in carbohydrates. Examples of carbohydrates are pasta, rice, whole grain breads and cereals, fruits, and vegetables.  · If dairy products cause your symptoms to flare up, try eating less of them. You might be able to handle yogurt better than other dairy products because it contains bacteria that help with digestion.  WHAT FOODS ARE NOT RECOMMENDED?  The following are some foods and drinks that may worsen your symptoms:  · Fatty foods, such as French fries.  · Milk products, such as cheese or ice cream.  · Chocolate.  · Alcohol.  · Products with caffeine, such as coffee.  · Carbonated drinks, such as soda.  The items listed above may not be a complete list of foods and beverages to  avoid. Contact your dietitian for more information.  WHAT FOODS ARE GOOD SOURCES OF FIBER?  Your health care provider or dietitian may recommend that you eat more foods that contain fiber. Fiber can help reduce constipation and other IBS symptoms. Add foods with fiber to your diet a little at a time so that your body can get used to them. Too much fiber at once might cause gas and swelling of your abdomen. The following are some foods that are good sources of fiber:  · Apples.  · Peaches.  · Pears.  · Berries.  · Figs.  · Broccoli (raw).  · Cabbage.  · Carrots.  · Raw peas.  · Kidney beans.  · Lima beans.  · Whole grain bread.  · Whole grain cereal.  FOR MORE INFORMATION   International Foundation for Functional Gastrointestinal Disorders: www.iffgd.org  National Institute of Diabetes and Digestive and Kidney Diseases: www.niddk.nih.gov/health-information/health-topics/digestive-diseases/ibs/Pages/facts.aspx     This information is not intended to replace advice given to you by your health care provider. Make sure you discuss any questions you have with your health care provider.     Document Released: 12/03/2003 Document Revised: 10/03/2014 Document Reviewed: 12/13/2013  Elsevier Interactive Patient Education ©2016 Elsevier Inc.  Irritable Bowel Syndrome, Adult  Irritable bowel syndrome (IBS) is not one specific disease. It is a group of symptoms that affects the organs responsible for digestion (gastrointestinal   or GI tract).   To regulate how your GI tract works, your body sends signals back and forth between your intestines and your brain. If you have IBS, there may be a problem with these signals. As a result, your GI tract does not function normally. Your intestines may become more sensitive and overreact to certain things. This is especially true when you eat certain foods or when you are under stress.   There are four types of IBS. These may be determined based on the consistency of your stool:   · IBS with  diarrhea.    · IBS with constipation.    · Mixed IBS.    · Unsubtyped IBS.    It is important to know which type of IBS you have. Some treatments are more likely to be helpful for certain types of IBS.   CAUSES   The exact cause of IBS is not known.  RISK FACTORS  You may have a higher risk of IBS if:  · You are a woman.  · You are younger than 63 years old.  · You have a family history of IBS.  · You have mental health problems.  · You have had bacterial infection of your GI tract.  SIGNS AND SYMPTOMS   Symptoms of IBS vary from person to person. The main symptom is abdominal pain or discomfort. Additional symptoms usually include one or more of the following:   · Diarrhea, constipation, or both.    · Abdominal swelling or bloating.    · Feeling full or sick after eating a small or regular-size meal.    · Frequent gas.    · Mucus in the stool.    · A feeling of having more stool left after a bowel movement.    Symptoms tend to come and go. They may be associated with stress, psychiatric conditions, or nothing at all.   DIAGNOSIS   There is no specific test to diagnose IBS. Your health care provider will make a diagnosis based on a physical exam, medical history, and your symptoms. You may have other tests to rule out other conditions that may be causing your symptoms. These may include:   · Blood tests.    · X-rays.    · CT scan.  · Endoscopy and colonoscopy. This is a test in which your GI tract is viewed with a long, thin, flexible tube.  TREATMENT  There is no cure for IBS, but treatment can help relieve symptoms. IBS treatment often includes:   · Changes to your diet, such as:    Eating more fiber.    Avoiding foods that cause symptoms.    Drinking more water.    Eating regular, medium-sized portioned meals.  · Medicines. These may include:    Fiber supplements if you have constipation.    Medicine to control diarrhea (antidiarrheal medicines).    Medicine to help control muscle spasms in your GI tract  (antispasmodic medicines).    Medicines to help with any mental health issues, such as antidepressants or tranquilizers.  · Therapy.    Talk therapy may help with anxiety, depression, or other mental health issues that can make IBS symptoms worse.  · Stress reduction.    Managing your stress can help keep symptoms under control.  HOME CARE INSTRUCTIONS   · Take medicines only as directed by your health care provider.  · Eat a healthy diet.    Avoid foods and drinks with added sugar.    Include more whole grains, fruits, and vegetables gradually into your diet.   This may be especially helpful if you have IBS with constipation.    Avoid any foods and drinks that make your symptoms worse. These may include dairy products and caffeinated or carbonated drinks.    Do not eat large meals.    Drink enough fluid to keep your urine clear or pale yellow.  · Exercise regularly. Ask your health care provider for recommendations of good activities for you.  · Keep all follow-up visits as directed by your health care provider. This is important.  SEEK MEDICAL CARE IF:   · You have constant pain.  · You have trouble or pain with swallowing.  · You have worsening diarrhea.  SEEK IMMEDIATE MEDICAL CARE IF:   · You have severe and worsening abdominal pain.    · You have diarrhea and:      You have a rash, stiff neck, or severe headache.      You are irritable, sleepy, or difficult to awaken.      You are weak, dizzy, or extremely thirsty.    · You have bright red blood in your stool or you have black tarry stools.    · You have unusual abdominal swelling that is painful.    · You vomit continuously.    · You vomit blood (hematemesis).    · You have both abdominal pain and a fever.         This information is not intended to replace advice given to you by your health care provider. Make sure you discuss any questions you have with your health care provider.     Document Released: 09/12/2005 Document Revised: 10/03/2014 Document  Reviewed: 05/30/2014  Elsevier Interactive Patient Education ©2016 Elsevier Inc.

## 2015-12-14 NOTE — Telephone Encounter (Signed)
This has been added in as a pharmacy. Thanks!

## 2015-12-14 NOTE — Telephone Encounter (Signed)
Pt called to give new pharmacy information: [facility] [address] [place], [place] [address]/[id number]/Phone: [phone]

## 2015-12-15 ENCOUNTER — Other Ambulatory Visit: Payer: Self-pay

## 2015-12-15 ENCOUNTER — Encounter: Payer: Self-pay | Admitting: Family Medicine

## 2015-12-15 DIAGNOSIS — K589 Irritable bowel syndrome without diarrhea: Secondary | ICD-10-CM

## 2015-12-15 LAB — HEMOGLOBIN A1C
Hgb A1c MFr Bld: 6.3 % — ABNORMAL HIGH (ref <5.7)
Mean Plasma Glucose: 134 mg/dL — ABNORMAL HIGH (ref <117)

## 2015-12-15 MED ORDER — LINACLOTIDE 145 MCG PO CAPS: 145.0000 ug | ORAL_CAPSULE | Freq: Every day | ORAL | Status: DC

## 2015-12-16 ENCOUNTER — Telehealth: Payer: Self-pay

## 2015-12-16 NOTE — Telephone Encounter (Signed)
-----   Message from Massie MaroonLachina M Hollis, [place]NP sent at 12/15/2015  5:04 PM EDT ----- Regarding: lab results Please inform Ms. Garris that hemoglobin a1C has improved to 6.3% from 7.2%. Continue medication as previously prescribed. Also, follow a low carbohydrate, low fat diet. Continue daily walking regimen with daughter.  Will follow up as previously scheduled  Thanks ----- Message -----    From: Lab in [facility]    Sent: 12/14/2015  11:44 PM      To: Massie MaroonLachina M Hollis, FNP

## 2015-12-16 NOTE — Telephone Encounter (Signed)
Called and left message for patient that labs had improved and to continue current medication and exercise regiment. Asked that patient keep follow up appointment as scheduled and call back if any questions. Thanks!

## 2015-12-18 ENCOUNTER — Telehealth: Payer: Self-pay

## 2015-12-18 NOTE — Telephone Encounter (Signed)
Pt would like to know if her prescriptions were [facility] to [facility] [place]. She would also like to know which medications were sent. Requesting call back.

## 2015-12-21 NOTE — Telephone Encounter (Signed)
Called and left message advising patient that linzess was sent into pharmacy on 12/15/2015 @ 9:24am to [facility].

## 2015-12-24 ENCOUNTER — Telehealth: Payer: Self-pay

## 2015-12-24 NOTE — Telephone Encounter (Signed)
Please see message from patient below. LOV 12/14/2015. Will she need to come in for this? Or is there something you can suggest otc? Please advise. Thanks!

## 2015-12-24 NOTE — Telephone Encounter (Signed)
Pt called and requested to be prescribed a medication for hot flashes and night sweats. Thanks!

## 2016-01-06 ENCOUNTER — Telehealth: Payer: Self-pay

## 2016-01-19 ENCOUNTER — Telehealth: Payer: Self-pay

## 2016-01-19 DIAGNOSIS — K219 Gastro-esophageal reflux disease without esophagitis: Secondary | ICD-10-CM

## 2016-01-19 MED ORDER — OMEPRAZOLE 20 MG PO CPDR: 20.0000 mg | DELAYED_RELEASE_CAPSULE | Freq: Every day | ORAL | Status: DC

## 2016-01-19 NOTE — Telephone Encounter (Signed)
Refill for omeprazole sent into pharmacy. Thanks ! 

## 2016-01-19 NOTE — Telephone Encounter (Signed)
Pt is requesting medication refill on omeprazole. Thanks!

## 2016-02-02 ENCOUNTER — Telehealth: Payer: Self-pay

## 2016-02-02 DIAGNOSIS — K589 Irritable bowel syndrome without diarrhea: Secondary | ICD-10-CM

## 2016-02-02 MED ORDER — LINACLOTIDE 145 MCG PO CAPS: 145.0000 ug | ORAL_CAPSULE | Freq: Every day | ORAL | Status: DC

## 2016-02-02 MED ORDER — TRAZODONE HCL 50 MG PO TABS: 25.0000 mg | ORAL_TABLET | Freq: Every evening | ORAL | Status: DC | PRN

## 2016-02-02 NOTE — Telephone Encounter (Signed)
Pt is requesting a medication refill for Trazodone. She also stated that she never received her medication for constipation and would like to get that also. Thanks!

## 2016-02-02 NOTE — Telephone Encounter (Signed)
linzess and trazodone sent into pharmacy. Thanks!

## 2016-02-03 ENCOUNTER — Telehealth: Payer: Self-pay

## 2016-02-03 MED ORDER — FLUTICASONE PROPIONATE 50 MCG/ACT NA SUSP: 2.0000 | Freq: Every day | NASAL | Status: DC

## 2016-02-03 NOTE — Telephone Encounter (Signed)
Pt is requesting a medication refill for Flonase. Thanks!

## 2016-02-03 NOTE — Telephone Encounter (Signed)
Refill for Flonase sent into pharmacy. Thanks!  

## 2016-03-21 ENCOUNTER — Ambulatory Visit: Payer: Self-pay | Admitting: Family Medicine

## 2016-04-26 ENCOUNTER — Other Ambulatory Visit: Payer: Self-pay

## 2016-04-26 ENCOUNTER — Ambulatory Visit: Payer: Self-pay | Admitting: Family Medicine

## 2016-04-26 ENCOUNTER — Telehealth: Payer: Self-pay

## 2016-04-26 DIAGNOSIS — K219 Gastro-esophageal reflux disease without esophagitis: Secondary | ICD-10-CM

## 2016-04-26 DIAGNOSIS — H8110 Benign paroxysmal vertigo, unspecified ear: Secondary | ICD-10-CM

## 2016-04-26 MED ORDER — MECLIZINE HCL 25 MG PO TABS: 25.0000 mg | ORAL_TABLET | Freq: Three times a day (TID) | ORAL | 2 refills | Status: DC | PRN

## 2016-04-26 MED ORDER — OMEPRAZOLE 20 MG PO CPDR: 20.0000 mg | DELAYED_RELEASE_CAPSULE | Freq: Every day | ORAL | 2 refills | Status: DC

## 2016-04-26 NOTE — Telephone Encounter (Signed)
Refills for omeprazole and meclizine sent into pharmacy. Thanks!

## 2016-06-03 ENCOUNTER — Ambulatory Visit: Payer: Self-pay | Attending: Internal Medicine

## 2016-06-08 ENCOUNTER — Other Ambulatory Visit: Payer: Self-pay | Admitting: Family Medicine

## 2016-06-08 MED ORDER — METFORMIN HCL 500 MG PO TABS: ORAL_TABLET | ORAL | 0 refills | Status: DC

## 2016-06-08 MED ORDER — TRAZODONE HCL 50 MG PO TABS: 25.0000 mg | ORAL_TABLET | Freq: Every evening | ORAL | 0 refills | Status: DC | PRN

## 2016-06-21 ENCOUNTER — Telehealth: Payer: Self-pay | Admitting: Family Medicine

## 2016-06-21 NOTE — Telephone Encounter (Signed)
Patient is expressing concerns regarding her health. She would like to be referred to multiple specialist and [facility].  She would like to see an OBGYN, someone for her fluid on knee, and arthritis....  Patient would like to switch PCP.   Patient has been approved for Financial assistance...   Please follow up

## 2016-06-28 ENCOUNTER — Encounter (HOSPITAL_COMMUNITY): Payer: Self-pay

## 2016-06-28 ENCOUNTER — Emergency Department (HOSPITAL_COMMUNITY)
Admission: EM | Admit: 2016-06-28 | Discharge: 2016-06-28 | Disposition: A | Discharge status: Home / Self Care | Payer: Self-pay

## 2016-06-28 ENCOUNTER — Encounter (HOSPITAL_COMMUNITY): Payer: Self-pay | Admitting: Family Medicine

## 2016-06-28 ENCOUNTER — Observation Stay (HOSPITAL_COMMUNITY)
Admission: EM | Admit: 2016-06-28 | Discharge: 2016-06-29 | Disposition: A | Discharge status: Home / Self Care | Payer: Self-pay | Attending: Family Medicine | Admitting: Family Medicine

## 2016-06-28 ENCOUNTER — Ambulatory Visit (HOSPITAL_COMMUNITY)
Admission: EM | Admit: 2016-06-28 | Discharge: 2016-06-28 | Disposition: A | Discharge status: Home / Self Care | Payer: Self-pay | Attending: Family Medicine | Admitting: Family Medicine

## 2016-06-28 ENCOUNTER — Emergency Department (HOSPITAL_COMMUNITY): Payer: Self-pay

## 2016-06-28 DIAGNOSIS — Z809 Family history of malignant neoplasm, unspecified: Secondary | ICD-10-CM | POA: Insufficient documentation

## 2016-06-28 DIAGNOSIS — Z8673 Personal history of transient ischemic attack (TIA), and cerebral infarction without residual deficits: Secondary | ICD-10-CM | POA: Insufficient documentation

## 2016-06-28 DIAGNOSIS — G459 Transient cerebral ischemic attack, unspecified: Principal | ICD-10-CM | POA: Diagnosis present

## 2016-06-28 DIAGNOSIS — Z841 Family history of disorders of kidney and ureter: Secondary | ICD-10-CM | POA: Insufficient documentation

## 2016-06-28 DIAGNOSIS — D649 Anemia, unspecified: Secondary | ICD-10-CM | POA: Diagnosis present

## 2016-06-28 DIAGNOSIS — K219 Gastro-esophageal reflux disease without esophagitis: Secondary | ICD-10-CM | POA: Insufficient documentation

## 2016-06-28 DIAGNOSIS — R270 Ataxia, unspecified: Secondary | ICD-10-CM

## 2016-06-28 DIAGNOSIS — R531 Weakness: Secondary | ICD-10-CM

## 2016-06-28 DIAGNOSIS — I152 Hypertension secondary to endocrine disorders: Secondary | ICD-10-CM | POA: Diagnosis present

## 2016-06-28 DIAGNOSIS — E1149 Type 2 diabetes mellitus with other diabetic neurological complication: Secondary | ICD-10-CM

## 2016-06-28 DIAGNOSIS — Z7984 Long term (current) use of oral hypoglycemic drugs: Secondary | ICD-10-CM | POA: Insufficient documentation

## 2016-06-28 DIAGNOSIS — G43009 Migraine without aura, not intractable, without status migrainosus: Secondary | ICD-10-CM

## 2016-06-28 DIAGNOSIS — K589 Irritable bowel syndrome without diarrhea: Secondary | ICD-10-CM | POA: Insufficient documentation

## 2016-06-28 DIAGNOSIS — I080 Rheumatic disorders of both mitral and aortic valves: Secondary | ICD-10-CM | POA: Insufficient documentation

## 2016-06-28 DIAGNOSIS — E119 Type 2 diabetes mellitus without complications: Secondary | ICD-10-CM | POA: Insufficient documentation

## 2016-06-28 DIAGNOSIS — Z91013 Allergy to seafood: Secondary | ICD-10-CM | POA: Insufficient documentation

## 2016-06-28 DIAGNOSIS — J45909 Unspecified asthma, uncomplicated: Secondary | ICD-10-CM | POA: Insufficient documentation

## 2016-06-28 DIAGNOSIS — G43909 Migraine, unspecified, not intractable, without status migrainosus: Secondary | ICD-10-CM | POA: Diagnosis present

## 2016-06-28 DIAGNOSIS — E1159 Type 2 diabetes mellitus with other circulatory complications: Secondary | ICD-10-CM | POA: Diagnosis present

## 2016-06-28 DIAGNOSIS — Z91040 Latex allergy status: Secondary | ICD-10-CM | POA: Insufficient documentation

## 2016-06-28 DIAGNOSIS — R51 Headache: Secondary | ICD-10-CM

## 2016-06-28 DIAGNOSIS — E118 Type 2 diabetes mellitus with unspecified complications: Secondary | ICD-10-CM

## 2016-06-28 DIAGNOSIS — I10 Essential (primary) hypertension: Secondary | ICD-10-CM | POA: Diagnosis present

## 2016-06-28 DIAGNOSIS — R519 Headache, unspecified: Secondary | ICD-10-CM

## 2016-06-28 DIAGNOSIS — F329 Major depressive disorder, single episode, unspecified: Secondary | ICD-10-CM | POA: Insufficient documentation

## 2016-06-28 DIAGNOSIS — G4700 Insomnia, unspecified: Secondary | ICD-10-CM | POA: Insufficient documentation

## 2016-06-28 DIAGNOSIS — E785 Hyperlipidemia, unspecified: Secondary | ICD-10-CM | POA: Insufficient documentation

## 2016-06-28 DIAGNOSIS — R202 Paresthesia of skin: Secondary | ICD-10-CM

## 2016-06-28 DIAGNOSIS — Z87891 Personal history of nicotine dependence: Secondary | ICD-10-CM | POA: Insufficient documentation

## 2016-06-28 DIAGNOSIS — Z79899 Other long term (current) drug therapy: Secondary | ICD-10-CM | POA: Insufficient documentation

## 2016-06-28 DIAGNOSIS — Z7982 Long term (current) use of aspirin: Secondary | ICD-10-CM | POA: Insufficient documentation

## 2016-06-28 DIAGNOSIS — Z91010 Allergy to peanuts: Secondary | ICD-10-CM | POA: Insufficient documentation

## 2016-06-28 DIAGNOSIS — Z8249 Family history of ischemic heart disease and other diseases of the circulatory system: Secondary | ICD-10-CM | POA: Insufficient documentation

## 2016-06-28 HISTORY — DX: Transient cerebral ischemic attack, unspecified: G45.9

## 2016-06-28 LAB — I-STAT TROPONIN, ED: Troponin i, poc: 0 ng/mL (ref 0.00–0.08)

## 2016-06-28 LAB — URINALYSIS, ROUTINE W REFLEX MICROSCOPIC
Bilirubin Urine: NEGATIVE
Glucose, UA: NEGATIVE mg/dL
Hgb urine dipstick: NEGATIVE
Ketones, ur: NEGATIVE mg/dL
LEUKOCYTES UA: NEGATIVE
NITRITE: NEGATIVE
PH: 5.5 (ref 5.0–8.0)
Protein, ur: NEGATIVE mg/dL
SPECIFIC GRAVITY, URINE: 1.009 (ref 1.005–1.030)

## 2016-06-28 LAB — I-STAT CHEM 8, ED
BUN: 20 mg/dL (ref 6–20)
CHLORIDE: 106 mmol/L (ref 101–111)
CREATININE: 1.1 mg/dL — AB (ref 0.44–1.00)
Calcium, Ion: 1.05 mmol/L — ABNORMAL LOW (ref 1.15–1.40)
GLUCOSE: 113 mg/dL — AB (ref 65–99)
HCT: 38 % (ref 36.0–46.0)
Hemoglobin: 12.9 g/dL (ref 12.0–15.0)
POTASSIUM: 4.4 mmol/L (ref 3.5–5.1)
Sodium: 138 mmol/L (ref 135–145)
TCO2: 22 mmol/L (ref 0–100)

## 2016-06-28 LAB — COMPREHENSIVE METABOLIC PANEL
ALK PHOS: 60 U/L (ref 38–126)
ALT: 17 U/L (ref 14–54)
ANION GAP: 11 (ref 5–15)
AST: 19 U/L (ref 15–41)
Albumin: 4.4 g/dL (ref 3.5–5.0)
BILIRUBIN TOTAL: 0.4 mg/dL (ref 0.3–1.2)
BUN: 15 mg/dL (ref 6–20)
CALCIUM: 9.9 mg/dL (ref 8.9–10.3)
CO2: 21 mmol/L — ABNORMAL LOW (ref 22–32)
Chloride: 105 mmol/L (ref 101–111)
Creatinine, Ser: 1.12 mg/dL — ABNORMAL HIGH (ref 0.44–1.00)
GFR calc non Af Amer: 51 mL/min — ABNORMAL LOW (ref >60)
GFR, EST AFRICAN AMERICAN: 59 mL/min — AB (ref >60)
Glucose, Bld: 111 mg/dL — ABNORMAL HIGH (ref 65–99)
POTASSIUM: 4.4 mmol/L (ref 3.5–5.1)
SODIUM: 137 mmol/L (ref 135–145)
TOTAL PROTEIN: 7.9 g/dL (ref 6.5–8.1)

## 2016-06-28 LAB — RAPID URINE DRUG SCREEN, HOSP PERFORMED
Amphetamines: NOT DETECTED
Barbiturates: NOT DETECTED
Benzodiazepines: NOT DETECTED
Cocaine: NOT DETECTED
OPIATES: NOT DETECTED
Tetrahydrocannabinol: NOT DETECTED

## 2016-06-28 LAB — DIFFERENTIAL
BASOS ABS: 0 10*3/uL (ref 0.0–0.1)
Basophils Relative: 1 %
EOS ABS: 0.1 10*3/uL (ref 0.0–0.7)
EOS PCT: 1 %
LYMPHS ABS: 2.9 10*3/uL (ref 0.7–4.0)
LYMPHS PCT: 48 %
MONO ABS: 0.4 10*3/uL (ref 0.1–1.0)
Monocytes Relative: 7 %
Neutro Abs: 2.6 10*3/uL (ref 1.7–7.7)
Neutrophils Relative %: 43 %

## 2016-06-28 LAB — APTT: APTT: 25 s (ref 24–36)

## 2016-06-28 LAB — CBC
HEMATOCRIT: 35.5 % — AB (ref 36.0–46.0)
HEMOGLOBIN: 11.5 g/dL — AB (ref 12.0–15.0)
MCH: 27.8 pg (ref 26.0–34.0)
MCHC: 32.4 g/dL (ref 30.0–36.0)
MCV: 86 fL (ref 78.0–100.0)
Platelets: 231 10*3/uL (ref 150–400)
RBC: 4.13 MIL/uL (ref 3.87–5.11)
RDW: 14.1 % (ref 11.5–15.5)
WBC: 6 10*3/uL (ref 4.0–10.5)

## 2016-06-28 LAB — ETHANOL: Alcohol, Ethyl (B): <5 mg/dL (ref <5)

## 2016-06-28 LAB — PROTIME-INR
INR: 0.94
Prothrombin Time: 12.6 seconds (ref 11.4–15.2)

## 2016-06-28 LAB — CBG MONITORING, ED: Glucose-Capillary: 112 mg/dL — ABNORMAL HIGH (ref 65–99)

## 2016-06-28 MED ORDER — DIPHENHYDRAMINE HCL 50 MG/ML IJ SOLN
25.0000 mg | Freq: Once | INTRAMUSCULAR | Status: AC
  Administered 2016-06-28: 25 mg via INTRAVENOUS
  Filled 2016-06-28: qty 1

## 2016-06-28 MED ORDER — ASPIRIN EC 325 MG PO TBEC
325.0000 mg | DELAYED_RELEASE_TABLET | Freq: Once | ORAL | Status: AC
  Administered 2016-06-29: 325 mg via ORAL
  Filled 2016-06-28: qty 1

## 2016-06-28 MED ORDER — METOCLOPRAMIDE HCL 5 MG/ML IJ SOLN
10.0000 mg | Freq: Once | INTRAMUSCULAR | Status: AC
  Administered 2016-06-28: 10 mg via INTRAVENOUS
  Filled 2016-06-28: qty 2

## 2016-06-28 NOTE — ED Provider Notes (Signed)
CSN: [id number]     Arrival date & time 06/28/16  1732 History   First MD Initiated Contact with Patient 06/28/16 1838     Chief Complaint  Patient presents with  . Weakness  . Numbness   (Consider location/radiation/quality/duration/timing/severity/associated sxs/prior Treatment) 65 year old female is accompanied by her significant other who states she has had some new symptoms in the past few days that involves numbness to the left side of her body more specifically the left face, left arm and leg. She is also complaining of headache across the forehead associated with dizziness, chronic nausea, occasional vomiting, dizziness, loss of balance, ataxic gait. She has a history of migraine headaches, asthma and hypertension and type 2 diabetes mellitus. Also complains of changes in vision sometimes blurry.      Past Medical History:  Diagnosis Date  . Asthma   . Diabetes mellitus without complication (HCC)   . Hypertension   . IBS (irritable bowel syndrome)    Past Surgical History:  Procedure Laterality Date  . CESAREAN SECTION    . EXPLORATORY LAPAROTOMY     Family History  Problem Relation Age of Onset  . Cancer Maternal Grandfather   . Kidney failure Mother   . Hypertension Mother   . Heart disease Father    Social History  Substance Use Topics  . Smoking status: Former Games developer  . Smokeless tobacco: Never Used  . Alcohol use 1.2 oz/week    2 Glasses of wine per week     Comment: social   OB History    No data available     Review of Systems  Constitutional: Positive for activity change. Negative for fever.  HENT: Negative for congestion, ear pain, postnasal drip, sore throat and trouble swallowing.   Eyes: Positive for photophobia and visual disturbance.  Respiratory: Negative for cough and shortness of breath.   Cardiovascular: Negative for chest pain.  Gastrointestinal: Positive for nausea. Negative for abdominal pain.  Genitourinary: Negative for dysuria.   Musculoskeletal: Negative for back pain and neck pain.  Skin: Negative.   Neurological: Positive for dizziness, weakness, light-headedness, numbness and headaches. Negative for seizures and syncope.  Psychiatric/Behavioral: Positive for decreased concentration.  All other systems reviewed and are negative.   Allergies  Peanuts [peanut oil]; Shrimp [shellfish allergy]; Latex; and Other  Home Medications   Prior to Admission medications   Medication Sig Start Date End Date Taking? Authorizing Provider  ascorbic Acid (VITAMIN C) 500 MG CPCR Take 500 mg by mouth daily.    Historical Provider, MD  aspirin 81 MG tablet Take 81 mg by mouth daily.    Historical Provider, MD  butalbital-acetaminophen-caffeine (FIORICET) 50-325-40 MG tablet Take 1 tablet by mouth every 6 (six) hours as needed for headache. Patient not taking: Reported on 12/14/2015 09/24/15 09/23/16  Massie Maroon, FNP  Fish Oil-Cholecalciferol (FISH OIL + D3) 1000-1000 MG-UNIT CAPS Take 1 tablet by mouth daily.    Historical Provider, MD  fluticasone (FLONASE) 50 MCG/ACT nasal spray Place 2 sprays into both nostrils daily. 02/03/16   Massie Maroon, FNP  linaclotide (LINZESS) 145 MCG CAPS capsule Take 1 capsule (145 mcg total) by mouth daily. 02/02/16   Massie Maroon, FNP  losartan (COZAAR) 25 MG tablet Take 1 tablet (25 mg total) by mouth 2 (two) times daily. 09/24/15   Massie Maroon, FNP  meclizine (ANTIVERT) 25 MG tablet Take 1 tablet (25 mg total) by mouth every 8 (eight) hours as needed for dizziness. 04/26/16  Massie Maroon, FNP  Melatonin 5 MG TABS Take 1 tablet (5 mg total) by mouth at bedtime. Patient not taking: Reported on 12/14/2015 05/13/15   Henrietta Hoover, NP  metFORMIN (GLUCOPHAGE) 500 MG tablet Take one tablet 2 times a day 06/08/16   Henrietta Hoover, NP  Misc Natural Products (COLON CLEANSE PO) Take by mouth. Reported on 12/14/2015    Historical Provider, MD  naproxen (NAPROSYN) 500 MG tablet Take 1  tablet (500 mg total) by mouth 2 (two) times daily with a meal. Patient not taking: Reported on 06/18/2015 05/13/15   Henrietta Hoover, NP  omeprazole (PRILOSEC) 20 MG capsule Take 1 capsule (20 mg total) by mouth daily. 04/26/16   Massie Maroon, FNP  oxybutynin (DITROPAN XL) 10 MG 24 hr tablet Take 1 tablet (10 mg total) by mouth at bedtime. Patient not taking: Reported on 12/14/2015 09/24/15   Massie Maroon, FNP  SUMAtriptan (IMITREX) 25 MG tablet Take 2 tablets (50 mg total) by mouth once. Maximum daily dose 200mg  09/24/15   Massie Maroon, FNP  traZODone (DESYREL) 50 MG tablet Take 0.5-1 tablets (25-50 mg total) by mouth at bedtime as needed for sleep. 06/08/16   Henrietta Hoover, NP   Meds Ordered and Administered this Visit  Medications - No data to display  BP 164/94 (BP Location: Left Arm)   Pulse 69   Temp 98.8 F (37.1 C) (Oral)   Resp 16   SpO2 100%  No data found.   Physical Exam  Constitutional: [name], place, and time. She appears well-developed and well-nourished. No distress.  HENT:  Head: Normocephalic and atraumatic.  Mouth/Throat: Oropharynx is clear and moist. No oropharyngeal exudate.  Eyes: Pupils are equal, round, and reactive to light.  Extraocular motion intact however slow to follow finger and testing. At performed test twice 2 obtain a normal exam.  Neck: Normal range of motion. Neck supple.  Cardiovascular: Normal rate and regular rhythm.   Pulmonary/Chest: Effort normal and breath sounds normal.  Musculoskeletal: She exhibits no edema.  Lymphadenopathy:    She has no cervical adenopathy.  Neurological: She is alert and oriented to person, place, and time. No sensory deficit. Gait abnormal. GCS eye subscore is 4. GCS verbal subscore is 5. GCS motor subscore is 6.  Left upper and lower extremity strength is less than right. Unable to ambulate heel to toe without falling out of place or with assistance. Positive Romberg. Borderline  coordination and finger-to-nose. Somewhat slow to follow some commands.  Skin: Skin is warm and dry. Capillary refill takes less than 2 seconds.  Nursing note and vitals reviewed.   Urgent Care Course   Clinical Course    Procedures (including critical care time)  Labs Review Labs Reviewed - No data to display  Imaging Review No results found.   Visual Acuity Review  Right Eye Distance:   Left Eye Distance:   Bilateral Distance:    Right Eye Near:   Left Eye Near:    Bilateral Near:         MDM   1. Weakness   2. Paresthesias   3. Ataxia   4. Nonintractable headache, unspecified chronicity pattern, unspecified headache type   5. Essential hypertension   6. Type 2 diabetes mellitus with complication, without long-term current use of insulin (HCC)    Due to new onset of various neurologic changes in symptoms in association with diagnoses of hypertension, type 2 diabetes mellitus  we will transfer to [place] for additional evaluation.    Hayden Rasmussenavid Leul Narramore, NP 06/28/16 [phone]

## 2016-06-28 NOTE — ED Notes (Signed)
Pt AxO X 4, no deficits noted, pt ambulatory. Zero on NIH, admitted for TIA workup

## 2016-06-28 NOTE — Consult Note (Signed)
Admission H&P    Chief Complaint: Headache with new onset left-sided numbness and weakness.  HPI: Julie Gilbert is an 65 y.o. female with a history of hypertension, diabetes mellitus, asthma, irritable bowel syndrome and migraine headaches, presenting with new onset numbness and weakness involving her left side. Onset was at noon today. She has no clinical history of stroke nor TIA. She's been taking aspirin daily. No change in speech was noted and no facial droop described. She's had a headache of mild to moderate severity. CT scan of her head showed faint symmetric lucency 90 anterior limb of the right internal capsule which could be representative of an age indeterminate lacunar infarct. 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 No acute hemorrhage was seen, nor any other acute findings. NIH stroke score was 1 for residual numbness involving left side.  LSN: 12:00 PM on 06/28/2016 tPA Given: No: Minimal residual deficits; beyond time under for treatment consideration. mRankin:  Past Medical History:  Diagnosis Date  . Asthma   . Diabetes mellitus without complication ([place])   . Hypertension   . IBS (irritable bowel syndrome)     Past Surgical History:  Procedure Laterality Date  . CESAREAN SECTION    . EXPLORATORY LAPAROTOMY      Family History  Problem Relation Age of Onset  . Cancer Maternal Grandfather   . Kidney failure Mother   . Hypertension Mother   . Heart disease Father    Social History:  reports that she has quit smoking. She has never used smokeless tobacco. She reports that she drinks about 1.2 oz of alcohol per week . She reports that she does not use drugs.  Allergies:  Allergies  Allergen Reactions  . Peanuts [Peanut Oil] Anaphylaxis  . Shrimp [Shellfish Allergy] Anaphylaxis  . Latex Hives  . Other Hives and Itching    Medications: Preadmission medications were reviewed by me.  ROS: History obtained from the  patient  General ROS: negative for - chills, fatigue, fever, night sweats, weight gain or weight loss Psychological ROS: negative for - behavioral disorder, hallucinations, memory difficulties, mood swings or suicidal ideation Ophthalmic ROS: negative for - blurry vision, double vision, eye pain or loss of vision ENT ROS: negative for - epistaxis, nasal discharge, oral lesions, sore throat, tinnitus or vertigo Allergy and Immunology ROS: negative for - hives or itchy/watery eyes Hematological and Lymphatic ROS: negative for - bleeding problems, bruising or swollen lymph nodes Endocrine ROS: negative for - galactorrhea, hair pattern changes, polydipsia/polyuria or temperature intolerance Respiratory ROS: negative for - cough, hemoptysis, shortness of breath or wheezing Cardiovascular ROS: negative for - chest pain, dyspnea on exertion, edema or irregular heartbeat Gastrointestinal ROS: negative for - abdominal pain, diarrhea, hematemesis, nausea/vomiting or stool incontinence Genito-Urinary ROS: negative for - dysuria, hematuria, incontinence or urinary frequency/urgency Musculoskeletal ROS: negative for - joint swelling or muscular weakness Neurological ROS: as noted in HPI Dermatological ROS: negative for rash and skin lesion changes  Physical Examination: Blood pressure 186/100, pulse 66, temperature 99 F (37.2 C), temperature source Oral, resp. rate 18, SpO2 100 %.  HEENT-  Normocephalic, no lesions, without obvious abnormality.  Normal external eye and conjunctiva.  Normal TM's bilaterally.  Normal auditory canals and external ears. Normal external nose, mucus membranes and septum.  Normal pharynx. Neck supple with no masses, nodes, nodules or enlargement. Cardiovascular - regular rate and rhythm, S1, S2 normal, no murmur, click, rub or gallop Lungs - chest clear, no  wheezing, rales, normal symmetric air entry Abdomen - soft, non-tender; bowel sounds normal; no masses,  no  organomegaly Extremities - no joint deformities, effusion, or inflammation and no edema  Neurologic Examination: Mental Status: Alert, oriented, no acute distress.  Speech fluent without evidence of aphasia. Able to follow commands without difficulty. Cranial Nerves: II-Visual fields were normal. III/IV/VI-Pupils were equal and reacted normally to light. Extraocular movements were full and conjugate.    V/VII-slightly reduced perception of tactile sensation of left-sided face compared to the right; no facial weakness. VIII-normal. X-normal speecht. XI: trapezius strength/neck flexion strength normal bilaterally XII-midline tongue extension with normal strength. Motor: 5/5 bilaterally with normal tone and bulk Sensory: Normal throughout. Deep Tendon Reflexes: 1+ and symmetric. Plantars: Mute bilaterally Cerebellar: Normal finger-to-nose and heel-to-shin testing. Carotid auscultation: Normal  Results for orders placed or performed during the hospital encounter of 06/28/16 (from the past 48 hour(s))  Ethanol     Status: None   Collection Time: 06/28/16  7:34 PM  Result Value Ref Range   Alcohol, Ethyl (B) <5 <5 mg/dL    Comment:        LOWEST DETECTABLE LIMIT FOR SERUM ALCOHOL IS 5 mg/dL FOR MEDICAL PURPOSES ONLY   Protime-INR     Status: None   Collection Time: 06/28/16  7:35 PM  Result Value Ref Range   Prothrombin Time 12.6 11.4 - 15.2 seconds   INR 0.94   APTT     Status: None   Collection Time: 06/28/16  7:35 PM  Result Value Ref Range   aPTT 25 24 - 36 seconds  CBC     Status: Abnormal   Collection Time: 06/28/16  7:35 PM  Result Value Ref Range   WBC 6.0 4.0 - 10.5 K/uL   RBC 4.13 3.87 - 5.11 MIL/uL   Hemoglobin 11.5 (L) 12.0 - 15.0 g/dL   HCT 35.5 (L) 36.0 - 46.0 %   MCV 86.0 78.0 - 100.0 fL   MCH 27.8 26.0 - 34.0 pg   MCHC 32.4 30.0 - 36.0 g/dL   RDW 14.1 11.5 - 15.5 %   Platelets 231 150 - 400 K/uL  Differential     Status: None   Collection Time: 06/28/16   7:35 PM  Result Value Ref Range   Neutrophils Relative % 43 %   Neutro Abs 2.6 1.7 - 7.7 K/uL   Lymphocytes Relative 48 %   Lymphs Abs 2.9 0.7 - 4.0 K/uL   Monocytes Relative 7 %   Monocytes Absolute 0.4 0.1 - 1.0 K/uL   Eosinophils Relative 1 %   Eosinophils Absolute 0.1 0.0 - 0.7 K/uL   Basophils Relative 1 %   Basophils Absolute 0.0 0.0 - 0.1 K/uL  Comprehensive metabolic panel     Status: Abnormal   Collection Time: 06/28/16  7:35 PM  Result Value Ref Range   Sodium 137 135 - 145 mmol/L   Potassium 4.4 3.5 - 5.1 mmol/L   Chloride 105 101 - 111 mmol/L   CO2 21 (L) 22 - 32 mmol/L   Glucose, Bld 111 (H) 65 - 99 mg/dL   BUN 15 6 - 20 mg/dL   Creatinine, Ser 1.12 (H) 0.44 - 1.00 mg/dL   Calcium 9.9 8.9 - 10.3 mg/dL   Total Protein 7.9 6.5 - 8.1 g/dL   Albumin 4.4 3.5 - 5.0 g/dL   AST 19 15 - 41 U/L   ALT 17 14 - 54 U/L   Alkaline Phosphatase 60 38 - 126 U/L  Total Bilirubin 0.4 0.3 - 1.2 mg/dL   GFR calc non Af Amer 51 (L) >60 mL/min   GFR calc Af Amer 59 (L) >60 mL/min    Comment: (NOTE) The eGFR has been calculated using the CKD EPI equation. This calculation has not been validated in all clinical situations. eGFR's persistently <60 mL/min signify possible Chronic Kidney Disease.    Anion gap 11 5 - 15  I-stat troponin, ED (not at [facility], [facility])     Status: None   Collection Time: 06/28/16  7:36 PM  Result Value Ref Range   Troponin i, poc 0.00 0.00 - 0.08 ng/mL   Comment 3            Comment: Due to the release kinetics of cTnI, a negative result within the first hours of the onset of symptoms does not rule out myocardial infarction with certainty. If myocardial infarction is still suspected, repeat the test at appropriate intervals.   I-Stat Chem 8, ED  (not at [facility], [facility])     Status: Abnormal   Collection Time: 06/28/16  7:38 PM  Result Value Ref Range   Sodium 138 135 - 145 mmol/L   Potassium 4.4 3.5 - 5.1 mmol/L   Chloride 106 101 - 111 mmol/L   BUN 20 6 - 20  mg/dL   Creatinine, Ser 1.10 (H) 0.44 - 1.00 mg/dL   Glucose, Bld 113 (H) 65 - 99 mg/dL   Calcium, Ion 1.05 (L) 1.15 - 1.40 mmol/L   TCO2 22 0 - 100 mmol/L   Hemoglobin 12.9 12.0 - 15.0 g/dL   HCT 38.0 36.0 - 46.0 %  CBG monitoring, ED     Status: Abnormal   Collection Time: 06/28/16  7:44 PM  Result Value Ref Range   Glucose-Capillary 112 (H) 65 - 99 mg/dL   Ct Head Code Stroke W/o Cm  Result Date: 06/28/2016 CLINICAL DATA:  Code stroke. Headache with onset last night and left-sided weakness. EXAM: CT HEAD WITHOUT CONTRAST TECHNIQUE: Contiguous axial images were obtained from the base of the skull through the vertex without intravenous contrast. COMPARISON:  None. FINDINGS: Brain: Lucency and right anterior caudate head likely represents a chronic lacunar infarct. Slight lucency in the right anterior limb of internal capsule may represent an age indeterminate lacunar infarct or prominent perivascular space. No evidence for large territory infarct, focal mass effect, intracranial hemorrhage, or hydrocephalus. Vascular: No hyperdense vessel or unexpected calcification. Skull: Normal. Negative for fracture or focal lesion. Sinuses/Orbits: No acute finding. Other: None. ASPECTS [facility] Stroke Program Early CT Score) - Ganglionic level infarction (caudate, lentiform nuclei, internal capsule, insula, M1-M3 cortex): 5-6 - Supraganglionic infarction (M4-M6 cortex): 3 Total score (0-10 with 10 being normal): 9 IMPRESSION: 1. 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 Faint asymmetric lucency in right anterior limb of internal capsule may represent an age-indeterminate lacunar infarct or prominent perivascular space. No large territory infarct or hemorrhage identified. 2. ASPECTS is 8-9 These results were called by telephone at the time of interpretation on 06/28/2016 at 7:53 pm to Dr. Pattricia Boss , who verbally acknowledged these results. Electronically Signed   By: Kristine Garbe M.D.   On: 06/28/2016 19:54    Assessment: 65 y.o. female with multiple risk factors for stroke presenting with new onset numbness and subjective weakness involving left side. Symptoms may be manifestations of complicated migraine headache. However, right subcortical TIA or small vessel ischemic infarction cannot be ruled out at this point.  Stroke Risk Factors - diabetes mellitus and hypertension  Plan: 1. HgbA1c, fasting lipid panel 2. MRI, MRA  of the brain without contrast 3. PT consult, OT consult, Speech consult 4. Echocardiogram 5. Carotid dopplers 6. Prophylactic therapy-Antiplatelet med: Aspirin  7. Risk factor modification 8. Telemetry monitoring  C.R. Nicole Kindred, MD [facility] [phone]  06/28/2016, 8:28 PM

## 2016-06-28 NOTE — ED Triage Notes (Signed)
Pt started having a headache last night that went away. Around 12pm today started having left sided weakness and tingling. No slurred speech.

## 2016-06-28 NOTE — ED Triage Notes (Signed)
Pt here for 2 weeks of headaches,vomiting, gait problems, nosebleeds and weakness. sts today she started having numbness on her left side and weakness. sts around 12 but she may have woken up that way shes not sure. NP aware.

## 2016-06-28 NOTE — ED Provider Notes (Signed)
[facility]-EMERGENCY DEPT Provider Note   CSN: [id number] Arrival date & time: 06/28/16  1921     History   Chief Complaint No chief complaint on file.   HPI Julie Gilbert is a 65 y.o. female.  The history is provided by the patient and a relative (daughter).  Weakness  Primary symptoms include focal weakness, loss of sensation, dizziness.  Primary symptoms include no speech change. This is a new problem. The current episode started 6 to 12 hours ago (began at 1200 today). The problem has not changed since onset.There was left upper extremity and left lower extremity focality noted. There has been no fever. Associated symptoms include headaches. Pertinent negatives include no shortness of breath, no chest pain, no vomiting, no altered mental status and no confusion. There were no medications administered prior to arrival. Associated medical issues do not include trauma, a bleeding disorder, seizures or CVA.    Past Medical History:  Diagnosis Date  . Asthma   . Diabetes mellitus without complication (HCC)   . Hypertension   . IBS (irritable bowel syndrome)     Patient Active Problem List   Diagnosis Date Noted  . Arthralgia 08/12/2015  . Gastroesophageal reflux disease without esophagitis 06/18/2015  . History of migraine headaches 05/13/2015  . Essential hypertension 05/13/2015  . Diabetes (HCC) 05/13/2015  . IBS (irritable bowel syndrome) 05/13/2015  . Migraine headache 05/13/2015  . Depression 05/13/2015  . Insomnia 05/13/2015    Past Surgical History:  Procedure Laterality Date  . CESAREAN SECTION    . EXPLORATORY LAPAROTOMY      OB History    No data available       Home Medications    Prior to Admission medications   Medication Sig Start Date End Date Taking? Authorizing Provider  ascorbic Acid (VITAMIN C) 500 MG CPCR Take 500 mg by mouth daily.    Historical Provider, MD  aspirin 81 MG tablet Take 81 mg by mouth daily.    Historical Provider, MD    butalbital-acetaminophen-caffeine (FIORICET) 50-325-40 MG tablet Take 1 tablet by mouth every 6 (six) hours as needed for headache. Patient not taking: Reported on 12/14/2015 09/24/15 09/23/16  Massie MaroonLachina M Hollis, FNP  Fish Oil-Cholecalciferol (FISH OIL + D3) 1000-1000 MG-UNIT CAPS Take 1 tablet by mouth daily.    Historical Provider, MD  fluticasone (FLONASE) 50 MCG/ACT nasal spray Place 2 sprays into both nostrils daily. 02/03/16   Massie MaroonLachina M Hollis, FNP  linaclotide (LINZESS) 145 MCG CAPS capsule Take 1 capsule (145 mcg total) by mouth daily. 02/02/16   Massie MaroonLachina M Hollis, FNP  losartan (COZAAR) 25 MG tablet Take 1 tablet (25 mg total) by mouth 2 (two) times daily. 09/24/15   Massie MaroonLachina M Hollis, FNP  meclizine (ANTIVERT) 25 MG tablet Take 1 tablet (25 mg total) by mouth every 8 (eight) hours as needed for dizziness. 04/26/16   Massie MaroonLachina M Hollis, FNP  metFORMIN (GLUCOPHAGE) 500 MG tablet Take one tablet 2 times a day 06/08/16   Henrietta HooverLinda C Bernhardt, NP  Misc Natural Products (COLON CLEANSE PO) Take by mouth. Reported on 12/14/2015    Historical Provider, MD  omeprazole (PRILOSEC) 20 MG capsule Take 1 capsule (20 mg total) by mouth daily. 04/26/16   Massie MaroonLachina M Hollis, FNP  SUMAtriptan (IMITREX) 25 MG tablet Take 2 tablets (50 mg total) by mouth once. Maximum daily dose 200mg  09/24/15   Massie MaroonLachina M Hollis, FNP  traZODone (DESYREL) 50 MG tablet Take 0.5-1 tablets (25-50 mg total) by mouth at  bedtime as needed for sleep. 06/08/16   Henrietta Hoover, NP    Family History Family History  Problem Relation Age of Onset  . Cancer Maternal Grandfather   . Kidney failure Mother   . Hypertension Mother   . Heart disease Father     Social History Social History  Substance Use Topics  . Smoking status: Former Games developer  . Smokeless tobacco: Never Used  . Alcohol use 1.2 oz/week    2 Glasses of wine per week     Comment: social     Allergies   Peanuts [peanut oil]; Shrimp [shellfish allergy]; Latex; and  Other   Review of Systems Review of Systems  Constitutional: Negative for chills and fever.  HENT: Positive for congestion, nosebleeds and rhinorrhea. Negative for trouble swallowing and voice change.   Respiratory: Negative for shortness of breath.   Cardiovascular: Negative for chest pain.  Gastrointestinal: Negative for abdominal pain, nausea and vomiting.  Genitourinary: Negative for flank pain.  Musculoskeletal: Negative for arthralgias and myalgias.  Skin: Negative for rash.  Neurological: Positive for dizziness, focal weakness, weakness and headaches. Negative for speech change.  Psychiatric/Behavioral: Negative for confusion.     Physical Exam Updated Vital Signs There were no vitals taken for this visit.  Physical Exam  Constitutional: [name], place, and time. She appears well-developed and well-nourished. No distress.  Pleasant, cooperative, non-toxic appearing  HENT:  Head: Normocephalic and atraumatic.  Eyes: Conjunctivae and EOM are normal. Pupils are equal, round, and reactive to light. No scleral icterus.  Neck: Normal range of motion. Neck supple.  Cardiovascular: Normal rate and regular rhythm.   Pulmonary/Chest: Effort normal and breath sounds normal. No respiratory distress.  Abdominal: Soft. She exhibits no distension. There is no tenderness.  Musculoskeletal: Normal range of motion. She exhibits no edema.  Neurological: She is alert and oriented to person, place, and time. No cranial nerve deficit. She exhibits normal muscle tone. Coordination normal.  LEFT: 4/5 grip, flexion/extension at elbow, hip flexion, plantar and dorsiflexion of foot   RIGHT: full 5/5 strength throughout.  Normal speech. Subjective decreased sensation in V1-V3 nerve distribuation  Skin: Skin is warm and dry. Capillary refill takes less than 2 seconds. No rash noted. She is not diaphoretic.  Psychiatric: She has a normal mood and affect.  Nursing note and vitals  reviewed.    ED Treatments / Results  Labs (all labs ordered are listed, but only abnormal results are displayed) Labs Reviewed  ETHANOL  PROTIME-INR  APTT  CBC  DIFFERENTIAL  COMPREHENSIVE METABOLIC PANEL  URINE RAPID DRUG SCREEN, HOSP PERFORMED  URINALYSIS, ROUTINE W REFLEX MICROSCOPIC (NOT AT [facility])  I-STAT CHEM 8, ED  I-STAT TROPOININ, ED    EKG  EKG Interpretation None       Radiology No results found.  Procedures Procedures (including critical care time)  Medications Ordered in ED Medications - No data to display   Initial Impression / Assessment and Plan / ED Course  I have reviewed the triage vital signs and the nursing notes.  Pertinent labs & imaging results that were available during my care of the patient were reviewed by me and considered in my medical decision making (see chart for details).  Clinical Course   Julie Gilbert is a 65 y.o. female without h/o CVA or TIA previously who presents to ED per recommendation of urgent care, where she was seen PTA for migraine headache (slow, gradual onset that started last night), and  for LUE and LLE weakness and decreased sensation, new starting around 1200 today. Pt only takes daily baby aspirin, no other blood thinners. Never had similar previously. 4/5 strength throughout LUE and LLE noted with good effort on exam. No facial deficits except for subjective decreased sensation in V1-V3 distribuation. No chest pain, doubt dissection. No headache currently but persistence of neurologic sx. Went to CT scan and initiation of code stroke upon triage. CT head with concerning lacunar infarct of right caudate, but no large-territory infarct and no hemorrhage. Not tPA candidate by onset. Given migraine cocktail medications, admitted to medicine for TIA/CVA work-up.  Pt condition, course, and admission were discussed with attending physician Dr. Margarita Grizzle.  Final Clinical Impressions(s) / ED Diagnoses   Final  diagnoses:  None    New Prescriptions New Prescriptions   No medications on file     Horald Pollen, MD 06/28/16 2356    Margarita Grizzle, MD 06/30/16 [phone]

## 2016-06-29 ENCOUNTER — Observation Stay (HOSPITAL_BASED_OUTPATIENT_CLINIC_OR_DEPARTMENT_OTHER): Payer: Self-pay

## 2016-06-29 ENCOUNTER — Observation Stay (HOSPITAL_COMMUNITY): Payer: Self-pay

## 2016-06-29 ENCOUNTER — Encounter (HOSPITAL_COMMUNITY): Payer: Self-pay | Admitting: Internal Medicine

## 2016-06-29 DIAGNOSIS — G459 Transient cerebral ischemic attack, unspecified: Secondary | ICD-10-CM

## 2016-06-29 DIAGNOSIS — G458 Other transient cerebral ischemic attacks and related syndromes: Secondary | ICD-10-CM

## 2016-06-29 DIAGNOSIS — D649 Anemia, unspecified: Secondary | ICD-10-CM | POA: Diagnosis present

## 2016-06-29 DIAGNOSIS — I10 Essential (primary) hypertension: Secondary | ICD-10-CM

## 2016-06-29 HISTORY — DX: Anemia, unspecified: D64.9

## 2016-06-29 LAB — VAS US CAROTID
LCCAPDIAS: -13 cm/s
LCCAPSYS: -65 cm/s
LEFT ECA DIAS: -7 cm/s
LEFT VERTEBRAL DIAS: -19 cm/s
LICADDIAS: -42 cm/s
LICAPSYS: -45 cm/s
Left CCA dist dias: -9 cm/s
Left CCA dist sys: -34 cm/s
Left ICA dist sys: -97 cm/s
Left ICA prox dias: -21 cm/s
RCCADSYS: -56 cm/s
RCCAPDIAS: -11 cm/s
RIGHT ECA DIAS: -10 cm/s
RIGHT VERTEBRAL DIAS: -20 cm/s
Right CCA prox sys: -41 cm/s

## 2016-06-29 LAB — CBC
HEMATOCRIT: 30.6 % — AB (ref 36.0–46.0)
Hemoglobin: 9.9 g/dL — ABNORMAL LOW (ref 12.0–15.0)
MCH: 27.3 pg (ref 26.0–34.0)
MCHC: 32.4 g/dL (ref 30.0–36.0)
MCV: 84.3 fL (ref 78.0–100.0)
PLATELETS: 211 10*3/uL (ref 150–400)
RBC: 3.63 MIL/uL — ABNORMAL LOW (ref 3.87–5.11)
RDW: 14.3 % (ref 11.5–15.5)
WBC: 4.4 10*3/uL (ref 4.0–10.5)

## 2016-06-29 LAB — COMPREHENSIVE METABOLIC PANEL
ALBUMIN: 3.8 g/dL (ref 3.5–5.0)
ALT: 15 U/L (ref 14–54)
AST: 15 U/L (ref 15–41)
Alkaline Phosphatase: 50 U/L (ref 38–126)
Anion gap: 8 (ref 5–15)
BUN: 12 mg/dL (ref 6–20)
CHLORIDE: 105 mmol/L (ref 101–111)
CO2: 24 mmol/L (ref 22–32)
CREATININE: 0.99 mg/dL (ref 0.44–1.00)
Calcium: 9.5 mg/dL (ref 8.9–10.3)
GFR calc Af Amer: >60 mL/min (ref >60)
GFR, EST NON AFRICAN AMERICAN: 59 mL/min — AB (ref >60)
GLUCOSE: 94 mg/dL (ref 65–99)
POTASSIUM: 4.2 mmol/L (ref 3.5–5.1)
SODIUM: 137 mmol/L (ref 135–145)
Total Bilirubin: 0.7 mg/dL (ref 0.3–1.2)
Total Protein: 6.7 g/dL (ref 6.5–8.1)

## 2016-06-29 LAB — ECHOCARDIOGRAM COMPLETE
Height: 66.5 in
Weight: 3204.8 oz

## 2016-06-29 LAB — GLUCOSE, CAPILLARY
GLUCOSE-CAPILLARY: 99 mg/dL (ref 65–99)
GLUCOSE-CAPILLARY: 129 mg/dL — AB (ref 65–99)
GLUCOSE-CAPILLARY: 135 mg/dL — AB (ref 65–99)
Glucose-Capillary: 107 mg/dL — ABNORMAL HIGH (ref 65–99)

## 2016-06-29 LAB — LIPID PANEL
CHOL/HDL RATIO: 5.2 ratio
CHOLESTEROL: 266 mg/dL — AB (ref 0–200)
HDL: 51 mg/dL (ref >40)
LDL CALC: 191 mg/dL — AB (ref 0–99)
TRIGLYCERIDES: 118 mg/dL (ref <150)
VLDL: 24 mg/dL (ref 0–40)

## 2016-06-29 MED ORDER — INSULIN ASPART 100 UNIT/ML ~~LOC~~ SOLN
0.0000 [IU] | Freq: Three times a day (TID) | SUBCUTANEOUS | Status: DC
  Administered 2016-06-29: 1 [IU] via SUBCUTANEOUS

## 2016-06-29 MED ORDER — PANTOPRAZOLE SODIUM 40 MG PO TBEC
40.0000 mg | DELAYED_RELEASE_TABLET | Freq: Every day | ORAL | Status: DC
  Administered 2016-06-29: 40 mg via ORAL
  Filled 2016-06-29: qty 1

## 2016-06-29 MED ORDER — METOCLOPRAMIDE HCL 5 MG/ML IJ SOLN
5.0000 mg | Freq: Four times a day (QID) | INTRAMUSCULAR | Status: DC | PRN
  Administered 2016-06-29: 5 mg via INTRAVENOUS
  Filled 2016-06-29: qty 2

## 2016-06-29 MED ORDER — LORAZEPAM 2 MG/ML IJ SOLN
0.5000 mg | Freq: Once | INTRAMUSCULAR | Status: AC
  Administered 2016-06-29: 0.5 mg via INTRAVENOUS
  Filled 2016-06-29: qty 1

## 2016-06-29 MED ORDER — VITAMIN D 1000 UNITS PO TABS
1000.0000 [IU] | ORAL_TABLET | Freq: Every day | ORAL | Status: DC
  Administered 2016-06-29: 1000 [IU] via ORAL
  Filled 2016-06-29: qty 1

## 2016-06-29 MED ORDER — STROKE: EARLY STAGES OF RECOVERY BOOK
Freq: Once | Status: AC
  Administered 2016-06-29: 02:00:00

## 2016-06-29 MED ORDER — LOSARTAN POTASSIUM 50 MG PO TABS
25.0000 mg | ORAL_TABLET | Freq: Two times a day (BID) | ORAL | Status: DC
  Administered 2016-06-29: 25 mg via ORAL
  Filled 2016-06-29: qty 1

## 2016-06-29 MED ORDER — ASPIRIN 325 MG PO TABS
325.0000 mg | ORAL_TABLET | Freq: Every day | ORAL | Status: DC
  Administered 2016-06-29: 325 mg via ORAL
  Filled 2016-06-29: qty 1

## 2016-06-29 MED ORDER — ATORVASTATIN CALCIUM 40 MG PO TABS
40.0000 mg | ORAL_TABLET | Freq: Every day | ORAL | Status: DC
  Administered 2016-06-29: 40 mg via ORAL
  Filled 2016-06-29: qty 1

## 2016-06-29 MED ORDER — DIPHENHYDRAMINE HCL 50 MG/ML IJ SOLN
25.0000 mg | Freq: Four times a day (QID) | INTRAMUSCULAR | Status: DC | PRN
  Administered 2016-06-29: 25 mg via INTRAVENOUS
  Filled 2016-06-29: qty 1

## 2016-06-29 MED ORDER — TOPIRAMATE 25 MG PO TABS
25.0000 mg | ORAL_TABLET | Freq: Two times a day (BID) | ORAL | Status: DC
  Administered 2016-06-29: 25 mg via ORAL
  Filled 2016-06-29: qty 1

## 2016-06-29 MED ORDER — VITAMIN D 1000 UNITS PO TABS: 1000.0000 [IU] | ORAL_TABLET | Freq: Every day | ORAL | Status: DC

## 2016-06-29 MED ORDER — METOCLOPRAMIDE HCL 5 MG PO TABS: 5.0000 mg | ORAL_TABLET | Freq: Four times a day (QID) | ORAL | 0 refills | Status: DC | PRN

## 2016-06-29 MED ORDER — OMEGA-3-ACID ETHYL ESTERS 1 G PO CAPS: 1.0000 g | ORAL_CAPSULE | Freq: Two times a day (BID) | ORAL | Status: DC

## 2016-06-29 MED ORDER — ASPIRIN 300 MG RE SUPP: 300.0000 mg | Freq: Every day | RECTAL | Status: DC

## 2016-06-29 MED ORDER — TRAZODONE HCL 50 MG PO TABS: 50.0000 mg | ORAL_TABLET | Freq: Every day | ORAL | Status: DC

## 2016-06-29 MED ORDER — ATORVASTATIN CALCIUM 40 MG PO TABS: 40.0000 mg | ORAL_TABLET | Freq: Every day | ORAL | 0 refills | Status: DC

## 2016-06-29 MED ORDER — FLUTICASONE PROPIONATE 50 MCG/ACT NA SUSP
2.0000 | Freq: Every day | NASAL | Status: DC
  Administered 2016-06-29: 2 via NASAL
  Filled 2016-06-29: qty 16

## 2016-06-29 MED ORDER — ENOXAPARIN SODIUM 40 MG/0.4ML ~~LOC~~ SOLN
40.0000 mg | SUBCUTANEOUS | Status: DC
  Administered 2016-06-29: 40 mg via SUBCUTANEOUS
  Filled 2016-06-29: qty 0.4

## 2016-06-29 MED ORDER — SODIUM CHLORIDE 0.9 % IV SOLN
INTRAVENOUS | Status: DC
  Administered 2016-06-29: 05:00:00 via INTRAVENOUS

## 2016-06-29 MED ORDER — OMEGA-3-ACID ETHYL ESTERS 1 G PO CAPS
1.0000 g | ORAL_CAPSULE | Freq: Every day | ORAL | Status: DC
  Administered 2016-06-29: 1 g via ORAL
  Filled 2016-06-29: qty 1

## 2016-06-29 MED ORDER — FISH OIL + D3 1000-1000 MG-UNIT PO CAPS: 1.0000 | ORAL_CAPSULE | Freq: Every day | ORAL | Status: DC

## 2016-06-29 NOTE — Progress Notes (Signed)
STROKE TEAM PROGRESS NOTE   HISTORY OF PRESENT ILLNESS (per record) Julie Gilbert is an 65 y.o. female with a history of hypertension, diabetes mellitus, asthma, irritable bowel syndrome and migraine headaches, presenting with new onset numbness and weakness involving her left side. Onset was at noon today (LKW 12n 06/28/2016). She has no clinical history of stroke nor TIA. She's been taking aspirin daily. No change in speech was noted and no facial droop described. She's had a headache of mild to moderate severity. CT scan of her head showed faint symmetric lucency 90 anterior limb of the right internal capsule which could be representative of an age indeterminate lacunar infarct. 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 No acute hemorrhage was seen, nor any other acute findings. NIH stroke score was 1 for residual numbness involving left side. Patient was not administered IV t-PA secondary to minimal residual deficits; beyond time under for treatment consideration.  She was admitted for further evaluation and treatment.   SUBJECTIVE (INTERVAL HISTORY) No family present at the bedside.  Overall she feels her condition is stable. She complains of being cold all the time and of a HA.  OBJECTIVE Temp:  [97.9 F (36.6 C)-99 F (37.2 C)] 98.2 F (36.8 C) (10/04 0616) Pulse Rate:  [52-72] 61 (10/04 0616) Cardiac Rhythm: Normal sinus rhythm (10/04 0022) Resp:  [15-22] 19 (10/04 0616) BP: (117-186)/(71-112) 119/78 (10/04 0616) SpO2:  [96 %-100 %] 100 % (10/04 0616) Weight:  [90.9 kg (200 lb 4.8 oz)] 90.9 kg (200 lb 4.8 oz) (10/03 2356)  CBC:  Recent Labs Lab 06/28/16 1935 06/28/16 1938 06/29/16 0705  WBC 6.0  --  4.4  NEUTROABS 2.6  --   --   HGB 11.5* 12.9 9.9*  HCT 35.5* 38.0 30.6*  MCV 86.0  --  84.3  PLT 231  --  211    Basic Metabolic Panel:  Recent Labs Lab 06/28/16 1935 06/28/16 1938 06/29/16 0705  NA 137 138 137  K 4.4 4.4 4.2  CL 105 106  105  CO2 21*  --  24  GLUCOSE 111* 113* 94  BUN 15 20 12   CREATININE 1.12* 1.10* 0.99  CALCIUM 9.9  --  9.5    Lipid Panel:    Component Value Date/Time   CHOL 266 (H) 06/29/2016 0705   TRIG 118 06/29/2016 0705   HDL 51 06/29/2016 0705   CHOLHDL 5.2 06/29/2016 0705   VLDL 24 06/29/2016 0705   LDLCALC 191 (H) 06/29/2016 0705   HgbA1c:  Lab Results  Component Value Date   HGBA1C 6.3 (H) 12/14/2015   Urine Drug Screen:    Component Value Date/Time   LABOPIA NONE DETECTED 06/28/2016 2030   COCAINSCRNUR NONE DETECTED 06/28/2016 2030   LABBENZ NONE DETECTED 06/28/2016 2030   AMPHETMU NONE DETECTED 06/28/2016 2030   THCU NONE DETECTED 06/28/2016 2030   LABBARB NONE DETECTED 06/28/2016 2030      IMAGING  MRI HEAD 06/29/2016 No acute intracranial process. Old RIGHT basal ganglia lacunar infarct. Old small cerebellar infarcts. Mild chronic small vessel ischemic disease.   MRA HEAD 06/29/2016 Negative.   Ct Head Code Stroke W/o Cm 06/28/2016 1. 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 Faint asymmetric lucency in right anterior limb of internal capsule may represent an age-indeterminate lacunar infarct or prominent perivascular space. No large territory infarct or hemorrhage identified. 2. ASPECTS is 8-9   Carotid Doppler   There is 1-39%  bilateral ICA stenosis. Vertebral artery flow is antegrade.     PHYSICAL EXAM Pleasant obese  middle aged lady not in distress. . Afebrile. Head is nontraumatic. Neck is supple without bruit.    Cardiac exam no murmur or gallop. Lungs are clear to auscultation. Distal pulses are well felt. Neurological Exam ;  Awake  Alert oriented x 3. Normal speech and language.eye movements full without nystagmus.fundi were not visualized. Vision acuity and fields appear normal. Hearing is normal. Palatal movements are normal. Face symmetric. Tongue midline. Normal strength, tone, reflexes and coordination. Nonorganic pattern of  sensory loss with splitting the midline to touch pinprick as well as has forehead to vibration sensation. . Gait deferred.  ASSESSMENT/PLAN [name] is a 65 y.o. female with history of hypertension, diabetes mellitus, asthma, irritable bowel syndrome and migraine headaches presenting with L sided numbness and weakness. She did not receive IV t-PA due to minimal residual deficits; beyond time under for treatment consideration.   Migraine HA  MRI  No acute stroke  MRA  negative  Carotid Doppler  No significant stenosis   2D Echo  pending   LDL 191  HgbA1c pending  Lovenox 40 mg sq daily for VTE prophylaxis  Diet heart healthy/carb modified Room service appropriate? Yes; Fluid consistency: Thin  aspirin 81 mg daily prior to admission, now on aspirin 325 mg daily. Continue at discharge  Add topamax 25 mg bid for HA control  Ongoing primary stroke risk factor management  Therapy recommendations:  OP OT, PT  Disposition:  Return home  [facility] for discharge once workup complete  No neurologic follow up necessary. PCP can refer for HA management as needed  Hypertension  Elevated at times  BP goal normotensive  Hyperlipidemia  Home meds:  Fish oil, resumed in hospital  LDL 191  Add statin lipitor 40 mg daily  Continue statin at discharge  Diabetes  HgbA1c pending  Other Stroke Risk Factors  Former Cigarette smoker  ETOH use, advised to drink no more than 1 drink(s) a day  Obesity, Body mass index is 31.84 kg/m., recommend weight loss, diet and exercise as appropriate   Migraines  Other Active Problems  CKD stage II  Chronic anemia  Hospital day # 0  Rhoderick Moody [facility] See Amion for Pager information 06/29/2016 1:56 PM  I have personally examined this patient, reviewed notes, independently viewed imaging studies, participated in medical decision making and plan of care.ROS completed by me personally and pertinent positives  fully documented  I have made any additions or clarifications directly to the above note. Agree with note above.  She presented with left-sided weakness and heaviness in the setting of a headache with negative MRI scan likely complicated migraine episode. Recommend trial of Topamax 25 mg twice daily for migraine prevention. Continue aspirin for stroke prevention. Greater than 50% time during this 35 minute visit was spent on counseling and coordination of care about TIA, stroke, complicated migraine and answering questions.  Delia Heady, MD Medical Director [facility] Pager: [phone] 06/29/2016 2:10 PM    To contact Stroke Continuity provider, please refer to [email or web address]. After hours, contact General Neurology

## 2016-06-29 NOTE — Discharge Summary (Signed)
Physician Discharge Summary  Julie Gilbert:[id number] DOB: 03-18-1951 DOA: 06/28/2016  PCP: Massie Maroon, FNP  Admit date: 06/28/2016 Discharge date: 06/29/2016  Admitted From: Home Disposition:  Home  Recommendations for Outpatient Follow-up:  1. Follow up with PCP in 1-2 weeks 2. Please obtain BMP/CBC in one week 3. Discuss treatment of headaches with PCP  Home Health:Yes Equipment/Devices: None  Discharge Condition:Stable CODE STATUS:Full Diet recommendation: Heart Healthy / Carb Modified  Brief/Interim Summary: Julie Gilbert a 65 y.o.femalewith migraine, diabetes mellitus, hypertension who was referred to the ER after patient was complaining of left upper extremity numbness and weakness. Patient started having headache since yesterday morning and at around noontime while taking rest patient developed numbness and weakness of the left upper extremity. Denies any associated blurred vision difficulty speaking and swallowing or any weakness of the other extremities. Since symptoms persisted patient went to the urgent care and was referred to the ER. CT head was showing possible old lacunar infarct. On-call neurologist was consulted and admitted for further management. By the time patient reached ER, patient's symptoms have largely resolved but still had headache which improved with Reglan and Benadryl. Presently headache free. Patient states headache was typical of the migraine. Reglan and Benadryl was given for headache and CT head was showing some concerns for lucency in the right caudate head is likely an old lacunar infarct. Faint asymmetric lucency in right anterior limb of internal capsule may represent an age-indeterminate lacunar infarct or prominent perivascular space. No large territory infarct or hemorrhage identified. which could be old.   Discharge Diagnoses:  Principal Problem:   Transient ischemic attack (TIA) Active Problems:   Essential hypertension    Diabetes mellitus type 2, controlled (HCC)   Migraine headache   Chronic anemia   TIA (transient ischemic attack)    Discharge Instructions  Discharge Instructions    Ambulatory referral to Occupational Therapy    Complete by:  As directed    [phone]   Ambulatory referral to Physical Therapy    Complete by:  As directed    [phone]   Call MD for:  difficulty breathing, headache or visual disturbances    Complete by:  As directed    Call MD for:  persistant dizziness or light-headedness    Complete by:  As directed    Call MD for:  severe uncontrolled pain    Complete by:  As directed    Call MD for:  temperature >100.4    Complete by:  As directed    Diet - low sodium heart healthy    Complete by:  As directed    Increase activity slowly    Complete by:  As directed        Medication List    TAKE these medications   aspirin 81 MG tablet Take 81 mg by mouth daily.   aspirin-acetaminophen-caffeine 250-250-65 MG tablet Commonly known as:  EXCEDRIN MIGRAINE Take 2 tablets by mouth every 6 (six) hours as needed for headache.   atorvastatin 40 MG tablet Commonly known as:  LIPITOR Take 1 tablet (40 mg total) by mouth daily at 6 PM.   butalbital-acetaminophen-caffeine 50-325-40 MG tablet Commonly known as:  FIORICET Take 1 tablet by mouth every 6 (six) hours as needed for headache.   FISH OIL + D3 1000-1000 MG-UNIT Caps Take 1 tablet by mouth daily.   fluticasone 50 MCG/ACT nasal spray Commonly known as:  FLONASE Place 2 sprays into both nostrils daily.   linaclotide 145 MCG  Caps capsule Commonly known as:  LINZESS Take 1 capsule (145 mcg total) by mouth daily. What changed:  when to take this  reasons to take this   losartan 25 MG tablet Commonly known as:  COZAAR Take 1 tablet (25 mg total) by mouth 2 (two) times daily.   meclizine 25 MG tablet Commonly known as:  ANTIVERT Take 1 tablet (25 mg total) by mouth every 8 (eight) hours as needed  for dizziness.   metFORMIN 500 MG tablet Commonly known as:  GLUCOPHAGE Take one tablet 2 times a day What changed:  how much to take  how to take this  when to take this  additional instructions   omeprazole 20 MG capsule Commonly known as:  PRILOSEC Take 1 capsule (20 mg total) by mouth daily.   SUMAtriptan 25 MG tablet Commonly known as:  IMITREX Take 2 tablets (50 mg total) by mouth once. Maximum daily dose 200mg    traZODone 50 MG tablet Commonly known as:  DESYREL Take 0.5-1 tablets (25-50 mg total) by mouth at bedtime as needed for sleep. What changed:  how much to take  when to take this       Allergies  Allergen Reactions  . Peanuts [Peanut Oil] Anaphylaxis  . Shrimp [Shellfish Allergy] Anaphylaxis  . Latex Hives    Consultations:  Neurology   Procedures/Studies: Mr Brain Wo Contrast  Result Date: 06/29/2016 CLINICAL DATA:  LEFT-sided body numbness for a few days, headache, dizziness, blurry vision, gait imbalance. History of migraine headaches, hypertension and diabetes. EXAM: MRI HEAD WITHOUT CONTRAST MRA HEAD WITHOUT CONTRAST TECHNIQUE: Multiplanar, multiecho pulse sequences of the brain and surrounding structures were obtained without intravenous contrast. Angiographic images of the head were obtained using MRA technique without contrast. COMPARISON:  CT HEAD June 28, 2016 and MRI of the brain April 08, 2015 FINDINGS: MRI HEAD FINDINGS BRAIN: No reduced diffusion to suggest acute ischemia. No susceptibility artifact to suggest hemorrhage. Bold RIGHT caudate head infarct with mineralization. Chronic RIGHT frontal microhemorrhage. A few scattered subcentimeter supratentorial white matter FLAIR T2 hyperintensities are unchanged. Old small LEFT cerebellar infarcts. The ventricles and sulci are normal for patient's age. No suspicious parenchymal signal, masses or mass effect. No abnormal extra-axial fluid collections. No extra-axial masses though, contrast  enhanced sequences would be more sensitive. VASCULAR: Normal major intracranial vascular flow voids present at skull base. SKULL AND UPPER CERVICAL SPINE: No abnormal sellar expansion. No suspicious calvarial bone marrow signal. Generalized bright T1 bone marrow signal compatible with osteopenia. Craniocervical junction maintained. SINUSES/ORBITS: The mastoid air-cells and included paranasal sinuses are well-aerated. The included ocular globes and orbital contents are non-suspicious. OTHER: None. MRA HEAD FINDINGS ANTERIOR CIRCULATION: Normal flow related enhancement of the included cervical, petrous, cavernous and supraclinoid internal carotid arteries. Hypoplastic RIGHT A1 segment. Normal flow related enhancement of the anterior and middle cerebral arteries, including distal segments. No large vessel occlusion, high-grade stenosis, abnormal luminal irregularity, aneurysm. POSTERIOR CIRCULATION: RIGHT vertebral artery is dominant. Basilar artery is patent, with normal flow related enhancement of the main branch vessels. Normal flow related enhancement of the posterior cerebral arteries. Robust RIGHT and tiny LEFT posterior communicating arteries present. No large vessel occlusion, high-grade stenosis, abnormal luminal irregularity, aneurysm. IMPRESSION: MRI HEAD: No acute intracranial process. Old RIGHT basal ganglia lacunar infarct. Old small cerebellar infarcts. Mild chronic small vessel ischemic disease. MRA HEAD: Negative. Electronically Signed   By: Awilda Metroourtnay  Bloomer M.D.   On: 06/29/2016 05:54   Mr Maxine GlennMra Head/brain Wo Cm  Result Date: 06/29/2016 CLINICAL DATA:  LEFT-sided body numbness for a few days, headache, dizziness, blurry vision, gait imbalance. History of migraine headaches, hypertension and diabetes. EXAM: MRI HEAD WITHOUT CONTRAST MRA HEAD WITHOUT CONTRAST TECHNIQUE: Multiplanar, multiecho pulse sequences of the brain and surrounding structures were obtained without intravenous contrast.  Angiographic images of the head were obtained using MRA technique without contrast. COMPARISON:  CT HEAD June 28, 2016 and MRI of the brain April 08, 2015 FINDINGS: MRI HEAD FINDINGS BRAIN: No reduced diffusion to suggest acute ischemia. No susceptibility artifact to suggest hemorrhage. Bold RIGHT caudate head infarct with mineralization. Chronic RIGHT frontal microhemorrhage. A few scattered subcentimeter supratentorial white matter FLAIR T2 hyperintensities are unchanged. Old small LEFT cerebellar infarcts. The ventricles and sulci are normal for patient's age. No suspicious parenchymal signal, masses or mass effect. No abnormal extra-axial fluid collections. No extra-axial masses though, contrast enhanced sequences would be more sensitive. VASCULAR: Normal major intracranial vascular flow voids present at skull base. SKULL AND UPPER CERVICAL SPINE: No abnormal sellar expansion. No suspicious calvarial bone marrow signal. Generalized bright T1 bone marrow signal compatible with osteopenia. Craniocervical junction maintained. SINUSES/ORBITS: The mastoid air-cells and included paranasal sinuses are well-aerated. The included ocular globes and orbital contents are non-suspicious. OTHER: None. MRA HEAD FINDINGS ANTERIOR CIRCULATION: Normal flow related enhancement of the included cervical, petrous, cavernous and supraclinoid internal carotid arteries. Hypoplastic RIGHT A1 segment. Normal flow related enhancement of the anterior and middle cerebral arteries, including distal segments. No large vessel occlusion, high-grade stenosis, abnormal luminal irregularity, aneurysm. POSTERIOR CIRCULATION: RIGHT vertebral artery is dominant. Basilar artery is patent, with normal flow related enhancement of the main branch vessels. Normal flow related enhancement of the posterior cerebral arteries. Robust RIGHT and tiny LEFT posterior communicating arteries present. No large vessel occlusion, high-grade stenosis, abnormal luminal  irregularity, aneurysm. IMPRESSION: MRI HEAD: No acute intracranial process. Old RIGHT basal ganglia lacunar infarct. Old small cerebellar infarcts. Mild chronic small vessel ischemic disease. MRA HEAD: Negative. Electronically Signed   By: Awilda Metro M.D.   On: 06/29/2016 05:54   Ct Head Code Stroke W/o Cm  Result Date: 06/28/2016 CLINICAL DATA:  Code stroke. Headache with onset last night and left-sided weakness. EXAM: CT HEAD WITHOUT CONTRAST TECHNIQUE: Contiguous axial images were obtained from the base of the skull through the vertex without intravenous contrast. COMPARISON:  None. FINDINGS: Brain: Lucency and right anterior caudate head likely represents a chronic lacunar infarct. Slight lucency in the right anterior limb of internal capsule may represent an age indeterminate lacunar infarct or prominent perivascular space. No evidence for large territory infarct, focal mass effect, intracranial hemorrhage, or hydrocephalus. Vascular: No hyperdense vessel or unexpected calcification. Skull: Normal. Negative for fracture or focal lesion. Sinuses/Orbits: No acute finding. Other: None. ASPECTS [facility] Stroke Program Early CT Score) - Ganglionic level infarction (caudate, lentiform nuclei, internal capsule, insula, M1-M3 cortex): 5-6 - Supraganglionic infarction (M4-M6 cortex): 3 Total score (0-10 with 10 being normal): 9 IMPRESSION: 1. 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 Faint asymmetric lucency in right anterior limb of internal capsule may represent an age-indeterminate lacunar infarct or prominent perivascular space. No large territory infarct or hemorrhage identified. 2. ASPECTS is 8-9 These results were called by telephone at the time of interpretation on 06/28/2016 at 7:53 pm to Dr. Margarita Grizzle , who verbally acknowledged these results. Electronically Signed   By: Mitzi Hansen M.D.   On: 06/28/2016 19:54      Subjective:  Patient seen and  examined.  Almost all of her symptoms are gone and she is asking if she can go home.  She was encouraged to follow up with her primary care physician for further management of her migraine but she does not need to follow up with neurology.  Discharge Exam: Vitals:   06/29/16 0616 06/29/16 1420  BP: 119/78 124/82  Pulse: 61 65  Resp: 19 18  Temp: 98.2 F (36.8 C) 98.7 F (37.1 C)   Vitals:   06/29/16 0200 06/29/16 0400 06/29/16 0616 06/29/16 1420  BP: (!) 149/74 134/71 119/78 124/82  Pulse: 68 72 61 65  Resp: 18 18 19 18   Temp: 98.4 F (36.9 C) 98.6 F (37 C) 98.2 F (36.8 C) 98.7 F (37.1 C)  TempSrc: Oral Oral Oral Oral  SpO2: 99% 98% 100% 100%  Weight:      Height:        General: Pt is alert, awake, not in acute distress Cardiovascular: RRR, S1/S2 +, no rubs, no gallops Respiratory: CTA bilaterally, no wheezing, no rhonchi Abdominal: Soft, NT, ND, bowel sounds + Extremities: no edema, no cyanosis    The results of significant diagnostics from this hospitalization (including imaging, microbiology, ancillary and laboratory) are listed below for reference.     Microbiology: No results found for this or any previous visit (from the past 240 hour(s)).   Labs: BNP (last 3 results) No results for input(s): BNP in the last 8760 hours. Basic Metabolic Panel:  Recent Labs Lab 06/28/16 1935 06/28/16 1938 06/29/16 0705  NA 137 138 137  K 4.4 4.4 4.2  CL 105 106 105  CO2 21*  --  24  GLUCOSE 111* 113* 94  BUN 15 20 12   CREATININE 1.12* 1.10* 0.99  CALCIUM 9.9  --  9.5   Liver Function Tests:  Recent Labs Lab 06/28/16 1935 06/29/16 0705  AST 19 15  ALT 17 15  ALKPHOS 60 50  BILITOT 0.4 0.7  PROT 7.9 6.7  ALBUMIN 4.4 3.8   No results for input(s): LIPASE, AMYLASE in the last 168 hours. No results for input(s): AMMONIA in the last 168 hours. CBC:  Recent Labs Lab 06/28/16 1935 06/28/16 1938 06/29/16 0705  WBC 6.0  --  4.4  NEUTROABS 2.6  --   --    HGB 11.5* 12.9 9.9*  HCT 35.5* 38.0 30.6*  MCV 86.0  --  84.3  PLT 231  --  211   Cardiac Enzymes: No results for input(s): CKTOTAL, CKMB, CKMBINDEX, TROPONINI in the last 168 hours. BNP: Invalid input(s): POCBNP CBG:  Recent Labs Lab 06/28/16 1944 06/29/16 0605 06/29/16 0937 06/29/16 1315 06/29/16 1649  GLUCAP 112* 99 129* 135* 107*   D-Dimer No results for input(s): DDIMER in the last 72 hours. Hgb A1c No results for input(s): HGBA1C in the last 72 hours. Lipid Profile  Recent Labs  06/29/16 0705  CHOL 266*  HDL 51  LDLCALC 191*  TRIG 118  CHOLHDL 5.2   Thyroid function studies No results for input(s): TSH, T4TOTAL, T3FREE, THYROIDAB in the last 72 hours.  Invalid input(s): FREET3 Anemia work up No results for input(s): VITAMINB12, FOLATE, FERRITIN, TIBC, IRON, RETICCTPCT in the last 72 hours. Urinalysis    Component Value Date/Time   COLORURINE YELLOW 06/28/2016 2030   APPEARANCEUR CLEAR 06/28/2016 2030   LABSPEC 1.009 06/28/2016 2030   PHURINE 5.5 06/28/2016 2030   GLUCOSEU NEGATIVE 06/28/2016 2030   HGBUR NEGATIVE 06/28/2016 2030   BILIRUBINUR NEGATIVE  06/28/2016 2030   KETONESUR NEGATIVE 06/28/2016 2030   PROTEINUR NEGATIVE 06/28/2016 2030   UROBILINOGEN 0.2 12/14/2015 1406   NITRITE NEGATIVE 06/28/2016 2030   LEUKOCYTESUR NEGATIVE 06/28/2016 2030   Sepsis Labs Invalid input(s): PROCALCITONIN,  WBC,  LACTICIDVEN Microbiology No results found for this or any previous visit (from the past 240 hour(s)).   Time coordinating discharge: Less than 30 minutes  SIGNED:   Bennett Scrape, MD  [facility] 06/29/2016, 6:09 PM Pager [phone] If 7PM-7AM, please contact night-coverage [email or web address] Password [email or web address]

## 2016-06-29 NOTE — Care Management Note (Signed)
Case Management Note  Patient Details  Name: Raiford Noblennie G Kohles MRN: [id number] Date of Birth: 1951-02-28  Subjective/Objective:                    Action/Plan: Patients daughter requested CM to assist in finding the pt a new PCP. Pt currently, per her daughter, has an orange [facility] and is [facility] % [facility]. Per the daughter the patient is not satisfied [facility]. [facility] and was unable to find the patient a new PCP using her current benefits. CM did provide the family with a copy of practices that see uninsured patients for them to further the search. [facility] stated they may be able to start seeing her after the first of the year. Pt and daughters informed and given [facility] contact number.  Pt with recommendations for outpatient therapy and MD wanting ordered. Patient and daughters interested. CM placed orders in EPIC and information on AVS. Bedside RN updated.  Expected Discharge Date:                  Expected Discharge Plan:  Home/Self Care  In-House Referral:     Discharge planning Services  CM Consult  Post Acute Care Choice:    Choice offered to:     DME Arranged:    DME Agency:     HH Arranged:    HH Agency:     Status of Service:  Completed, signed off  If discussed at [employer] Length of Stay Meetings, dates discussed:    Additional Comments:  Kermit BaloKelli F Gerilyn Stargell, RN 06/29/2016, 7:29 PM

## 2016-06-29 NOTE — Progress Notes (Signed)
PROGRESS NOTE    Julie Gilbert  ZOX:[id number] DOB: 1951-06-06 DOA: 06/28/2016 PCP: Massie Maroon, FNP    Brief Narrative:   Julie Gilbert is a 65 y.o. female with migraine, diabetes mellitus, hypertension who was referred to the ER after patient was complaining of left upper extremity numbness and weakness. Patient started having headache since yesterday morning and at around noontime while taking rest patient developed numbness and weakness of the left upper extremity. Denies any associated blurred vision difficulty speaking and swallowing or any weakness of the other extremities. Since symptoms persisted patient went to the urgent care and was referred to the ER. CT head was showing possible old lacunar infarct. On-call neurologist was consulted and admitted for further management. By the time patient reached ER, patient's symptoms have largely resolved but still had headache which improved with Reglan and Benadryl. Presently headache free. Patient states headache was typical of the migraine. Reglan and Benadryl was given for headache and CT head was showing some concerns for lucency in the right caudate head is likely an old lacunar infarct. Faint asymmetric lucency in right anterior limb of internal capsule may represent an age-indeterminate lacunar infarct or prominent perivascular space. No large territory infarct or hemorrhage identified. which could be old.    Assessment & Plan:   Principal Problem:   Transient ischemic attack (TIA) Active Problems:   Essential hypertension   Diabetes mellitus type 2, controlled (HCC)   Migraine headache   Chronic anemia   TIA (transient ischemic attack)   TIA  - patient's symptoms are concerning for possible TIA versus migraine associated symptoms - Neurology consulted and appreciate their recommendations - MRI/ MRA ordered - Carotid doppler ordered - Echocardiogram ordered - Lipid panel drawn and shows hyperlipidemia (LDL of 191) -   HgA1c pending  Migraine headache  - improved with Reglan and Benadryl - Reglan and benadryl continued and written as PRN  Hypertension - holding Cozaar - can restart if cleared by neurology  Diabetes mellitus type 2  - hold metformin while inpatient - Sliding scale coverage.  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  Chronic anemia  - follow CBC - decrease in H/H which could be dilutional   DVT prophylaxis: lovenox Code Status: Full code Family Communication: daughter is bedside and updated on plan Disposition Plan: pending workup and evaluation by neurology   Consultants:   Neurology  Procedures:   none  Antimicrobials:   none    Subjective: Patient states she is sleepy this morning and mentions she barely just got brought up to her room.  Still states she has some weakness on the left side but it is improved from yesterday.  Does report a sporadic history of falls but denies any falls prior to admission yesterday.  No changes in vision, no chest pain, no chest pressure, no shortness of breath or increased work of breathing.  Daughter is bedside.  Patient has just finished ambulating at time of exam.   Objective: Vitals:   06/29/16 0000 06/29/16 0200 06/29/16 0400 06/29/16 0616  BP: (!) 157/86 (!) 149/74 134/71 119/78  Pulse: 65 68 72 61  Resp:  18 18 19   Temp: 98.8 F (37.1 C) 98.4 F (36.9 C) 98.6 F (37 C) 98.2 F (36.8 C)  TempSrc: Oral Oral Oral Oral  SpO2: 100% 99% 98% 100%  Weight:      Height:        Intake/Output Summary (Last 24  hours) at 06/29/16 0840 Last data filed at 06/29/16 0636  Gross per 24 hour  Intake              450 ml  Output                0 ml  Net              450 ml   Filed Weights   06/28/16 2356  Weight: 90.9 kg (200 lb 4.8 oz)    Examination:  General exam: Appears calm and comfortable  Respiratory system: Clear to auscultation. Respiratory effort normal. Cardiovascular  system: S1 & S2 heard, RRR. No JVD, murmurs, rubs, gallops or clicks. No pedal edema. Gastrointestinal system: Abdomen is nondistended, soft and nontender. No organomegaly or masses felt. Normal bowel sounds heard. Central nervous system: Alert and oriented. No focal neurological deficits. Extremities: slightly weakened hand grip on left as well as flexion and extension at elbow on left.  Able to move all extremities independently. Neuro:  EOMI, PERRL, CN II- XI GI Skin: No rashes, lesions or ulcers Psychiatry: Judgement and insight appear normal. Mood & affect appropriate.     Data Reviewed: I have personally reviewed following labs and imaging studies  CBC:  Recent Labs Lab 06/28/16 1935 06/28/16 1938 06/29/16 0705  WBC 6.0  --  4.4  NEUTROABS 2.6  --   --   HGB 11.5* 12.9 9.9*  HCT 35.5* 38.0 30.6*  MCV 86.0  --  84.3  PLT 231  --  211   Basic Metabolic Panel:  Recent Labs Lab 06/28/16 1935 06/28/16 1938 06/29/16 0705  NA 137 138 137  K 4.4 4.4 4.2  CL 105 106 105  CO2 21*  --  24  GLUCOSE 111* 113* 94  BUN 15 20 12   CREATININE 1.12* 1.10* 0.99  CALCIUM 9.9  --  9.5   GFR: Estimated Creatinine Clearance: 66.8 mL/min (by C-G formula based on SCr of 0.99 mg/dL). Liver Function Tests:  Recent Labs Lab 06/28/16 1935 06/29/16 0705  AST 19 15  ALT 17 15  ALKPHOS 60 50  BILITOT 0.4 0.7  PROT 7.9 6.7  ALBUMIN 4.4 3.8   No results for input(s): LIPASE, AMYLASE in the last 168 hours. No results for input(s): AMMONIA in the last 168 hours. Coagulation Profile:  Recent Labs Lab 06/28/16 1935  INR 0.94   Cardiac Enzymes: No results for input(s): CKTOTAL, CKMB, CKMBINDEX, TROPONINI in the last 168 hours. BNP (last 3 results) No results for input(s): PROBNP in the last 8760 hours. HbA1C: No results for input(s): HGBA1C in the last 72 hours. CBG:  Recent Labs Lab 06/28/16 1944 06/29/16 0605  GLUCAP 112* 99   Lipid Profile:  Recent Labs   06/29/16 0705  CHOL 266*  HDL 51  LDLCALC 191*  TRIG 118  CHOLHDL 5.2   Thyroid Function Tests: No results for input(s): TSH, T4TOTAL, FREET4, T3FREE, THYROIDAB in the last 72 hours. Anemia Panel: No results for input(s): VITAMINB12, FOLATE, FERRITIN, TIBC, IRON, RETICCTPCT in the last 72 hours. Sepsis Labs: No results for input(s): PROCALCITON, LATICACIDVEN in the last 168 hours.  No results found for this or any previous visit (from the past 240 hour(s)).       Radiology Studies: Mr Brain Wo Contrast  Result Date: 06/29/2016 CLINICAL DATA:  LEFT-sided body numbness for a few days, headache, dizziness, blurry vision, gait imbalance. History of migraine headaches, hypertension and diabetes. EXAM: MRI HEAD WITHOUT CONTRAST MRA HEAD  WITHOUT CONTRAST TECHNIQUE: Multiplanar, multiecho pulse sequences of the brain and surrounding structures were obtained without intravenous contrast. Angiographic images of the head were obtained using MRA technique without contrast. COMPARISON:  CT HEAD June 28, 2016 and MRI of the brain April 08, 2015 FINDINGS: MRI HEAD FINDINGS BRAIN: No reduced diffusion to suggest acute ischemia. No susceptibility artifact to suggest hemorrhage. Bold RIGHT caudate head infarct with mineralization. Chronic RIGHT frontal microhemorrhage. A few scattered subcentimeter supratentorial white matter FLAIR T2 hyperintensities are unchanged. Old small LEFT cerebellar infarcts. The ventricles and sulci are normal for patient's age. No suspicious parenchymal signal, masses or mass effect. No abnormal extra-axial fluid collections. No extra-axial masses though, contrast enhanced sequences would be more sensitive. VASCULAR: Normal major intracranial vascular flow voids present at skull base. SKULL AND UPPER CERVICAL SPINE: No abnormal sellar expansion. No suspicious calvarial bone marrow signal. Generalized bright T1 bone marrow signal compatible with osteopenia. Craniocervical junction  maintained. SINUSES/ORBITS: The mastoid air-cells and included paranasal sinuses are well-aerated. The included ocular globes and orbital contents are non-suspicious. OTHER: None. MRA HEAD FINDINGS ANTERIOR CIRCULATION: Normal flow related enhancement of the included cervical, petrous, cavernous and supraclinoid internal carotid arteries. Hypoplastic RIGHT A1 segment. Normal flow related enhancement of the anterior and middle cerebral arteries, including distal segments. No large vessel occlusion, high-grade stenosis, abnormal luminal irregularity, aneurysm. POSTERIOR CIRCULATION: RIGHT vertebral artery is dominant. Basilar artery is patent, with normal flow related enhancement of the main branch vessels. Normal flow related enhancement of the posterior cerebral arteries. Robust RIGHT and tiny LEFT posterior communicating arteries present. No large vessel occlusion, high-grade stenosis, abnormal luminal irregularity, aneurysm. IMPRESSION: MRI HEAD: No acute intracranial process. Old RIGHT basal ganglia lacunar infarct. Old small cerebellar infarcts. Mild chronic small vessel ischemic disease. MRA HEAD: Negative. Electronically Signed   By: Awilda Metro M.D.   On: 06/29/2016 05:54   Mr Maxine Glenn Head/brain RU Cm  Result Date: 06/29/2016 CLINICAL DATA:  LEFT-sided body numbness for a few days, headache, dizziness, blurry vision, gait imbalance. History of migraine headaches, hypertension and diabetes. EXAM: MRI HEAD WITHOUT CONTRAST MRA HEAD WITHOUT CONTRAST TECHNIQUE: Multiplanar, multiecho pulse sequences of the brain and surrounding structures were obtained without intravenous contrast. Angiographic images of the head were obtained using MRA technique without contrast. COMPARISON:  CT HEAD June 28, 2016 and MRI of the brain April 08, 2015 FINDINGS: MRI HEAD FINDINGS BRAIN: No reduced diffusion to suggest acute ischemia. No susceptibility artifact to suggest hemorrhage. Bold RIGHT caudate head infarct with  mineralization. Chronic RIGHT frontal microhemorrhage. A few scattered subcentimeter supratentorial white matter FLAIR T2 hyperintensities are unchanged. Old small LEFT cerebellar infarcts. The ventricles and sulci are normal for patient's age. No suspicious parenchymal signal, masses or mass effect. No abnormal extra-axial fluid collections. No extra-axial masses though, contrast enhanced sequences would be more sensitive. VASCULAR: Normal major intracranial vascular flow voids present at skull base. SKULL AND UPPER CERVICAL SPINE: No abnormal sellar expansion. No suspicious calvarial bone marrow signal. Generalized bright T1 bone marrow signal compatible with osteopenia. Craniocervical junction maintained. SINUSES/ORBITS: The mastoid air-cells and included paranasal sinuses are well-aerated. The included ocular globes and orbital contents are non-suspicious. OTHER: None. MRA HEAD FINDINGS ANTERIOR CIRCULATION: Normal flow related enhancement of the included cervical, petrous, cavernous and supraclinoid internal carotid arteries. Hypoplastic RIGHT A1 segment. Normal flow related enhancement of the anterior and middle cerebral arteries, including distal segments. No large vessel occlusion, high-grade stenosis, abnormal luminal irregularity, aneurysm. POSTERIOR CIRCULATION: RIGHT vertebral artery is  dominant. Basilar artery is patent, with normal flow related enhancement of the main branch vessels. Normal flow related enhancement of the posterior cerebral arteries. Robust RIGHT and tiny LEFT posterior communicating arteries present. No large vessel occlusion, high-grade stenosis, abnormal luminal irregularity, aneurysm. IMPRESSION: MRI HEAD: No acute intracranial process. Old RIGHT basal ganglia lacunar infarct. Old small cerebellar infarcts. Mild chronic small vessel ischemic disease. MRA HEAD: Negative. Electronically Signed   By: Awilda Metro M.D.   On: 06/29/2016 05:54   Ct Head Code Stroke W/o  Cm  Result Date: 06/28/2016 CLINICAL DATA:  Code stroke. Headache with onset last night and left-sided weakness. EXAM: CT HEAD WITHOUT CONTRAST TECHNIQUE: Contiguous axial images were obtained from the base of the skull through the vertex without intravenous contrast. COMPARISON:  None. FINDINGS: Brain: Lucency and right anterior caudate head likely represents a chronic lacunar infarct. Slight lucency in the right anterior limb of internal capsule may represent an age indeterminate lacunar infarct or prominent perivascular space. No evidence for large territory infarct, focal mass effect, intracranial hemorrhage, or hydrocephalus. Vascular: No hyperdense vessel or unexpected calcification. Skull: Normal. Negative for fracture or focal lesion. Sinuses/Orbits: No acute finding. Other: None. ASPECTS [facility] Stroke Program Early CT Score) - Ganglionic level infarction (caudate, lentiform nuclei, internal capsule, insula, M1-M3 cortex): 5-6 - Supraganglionic infarction (M4-M6 cortex): 3 Total score (0-10 with 10 being normal): 9 IMPRESSION: 1. 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 Faint asymmetric lucency in right anterior limb of internal capsule may represent an age-indeterminate lacunar infarct or prominent perivascular space. No large territory infarct or hemorrhage identified. 2. ASPECTS is 8-9 These results were called by telephone at the time of interpretation on 06/28/2016 at 7:53 pm to Dr. Margarita Grizzle , who verbally acknowledged these results. Electronically Signed   By: Mitzi Hansen M.D.   On: 06/28/2016 19:54        Scheduled Meds: . aspirin  300 mg Rectal Daily   Or  . aspirin  325 mg Oral Daily  . omega-3 acid ethyl esters  1 g Oral Daily   And  . cholecalciferol  1,000 Units Oral Daily  . enoxaparin (LOVENOX) injection  40 mg Subcutaneous Q24H  . fluticasone  2 spray Each Nare Daily  . insulin aspart  0-9 Units Subcutaneous TID WC  . losartan  25  mg Oral BID  . pantoprazole  40 mg Oral Daily  . traZODone  50-100 mg Oral QHS   Continuous Infusions: . sodium chloride 75 mL/hr at 06/29/16 0636     LOS: 0 days    Time spent: 30 minutes    Bennett Scrape, MD [facility] Pager [phone]  If 7PM-7AM, please contact night-coverage [email or web address] Password [email or web address] 06/29/2016, 8:40 AM

## 2016-06-29 NOTE — Progress Notes (Signed)
Julie Gilbert is a 65 year old female brought to ED via POV by daughter with a headache starting last night, bleeding from her nose x1 week, new onset of Left side weakness and numbness. LKW 1200. CBG 113. NIHSS 0 Pt admitted for TIA work up.

## 2016-06-29 NOTE — Progress Notes (Signed)
Echocardiogram 2D Echocardiogram has been performed.  Julie Gilbert 06/29/2016, 2:54 PM

## 2016-06-29 NOTE — Evaluation (Signed)
Occupational Therapy Evaluation Patient Details Name: Julie Gilbert Cotterman MRN: [id number] DOB: 1950/11/13 Today's Date: 06/29/2016    History of Present Illness 65 y.o. female with migraine, diabetes mellitus, hypertension who was referred to the ER after patient was complaining of left upper extremity numbness and weakness. MRI on 10/4 negative for acute infarct. 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    Clinical Impression   Pt reports she was independent with ADL PTA. Currently pt overall supervision for ADL and min guard for functional mobility. Pt presenting with LUE weakness/decreased sensation and impaired standing balance impacting her independence and safety with ADL and functional mobility. Pt with posterior LOB x2 during functional mobility this session requiring light min assist to correct. Pt reports she has noticed increasingly worse balance deficits over the past ~9 months but feels it is worse now than PTA. Pt planning to d/c home with intermittent family supervision. Recommending outpatient OT for follow up to maximize independence and safety with ADL and functional mobility; pt may progress to home without OT follow up. Pt would benefit from continued skilled OT to address established goals.    Follow Up Recommendations  Outpatient OT;Supervision/Assistance - 24 hour    Equipment Recommendations  Tub/shower seat    Recommendations for Other Services PT consult     Precautions / Restrictions Precautions Precautions: None Restrictions Weight Bearing Restrictions: No      Mobility Bed Mobility Overal bed mobility: Needs Assistance Bed Mobility: Supine to Sit;Sit to Supine     Supine to sit: Supervision;HOB elevated Sit to supine: Supervision;HOB elevated   General bed mobility comments: Supervision for safety; no physical assist requried. Increased time needed. HOB elevated without use of bed rails.  Transfers Overall  transfer level: Needs assistance Equipment used: None Transfers: Sit to/from Stand Sit to Stand: Min guard         General transfer comment: Initially unsteady with sit to stand from EOB. Pt able to regain balance without physical assist.     Balance Overall balance assessment: Needs assistance Sitting-balance support: Feet supported;No upper extremity supported Sitting balance-Leahy Scale: Good     Standing balance support: During functional activity;No upper extremity supported Standing balance-Leahy Scale: Fair Standing balance comment: Posterior LOB x2 with dynamic standing; light min assist to correct.                            ADL Overall ADL's : Needs assistance/impaired Eating/Feeding: Modified independent;Sitting   Grooming: Supervision/safety;Standing;Wash/dry hands   Upper Body Bathing: Set up;Supervision/ safety;Sitting   Lower Body Bathing: Min guard;Sit to/from stand   Upper Body Dressing : Supervision/safety;Sitting   Lower Body Dressing: Min guard;Sit to/from stand   Toilet Transfer: Min guard;Ambulation;Regular Toilet   Toileting- ArchitectClothing Manipulation and Hygiene: Supervision/safety;Sit to/from stand       Functional mobility during ADLs: Min guard General ADL Comments: Pt initially unsteady on feet but able to regain balance without physical assist. Increased balance deficits noted during functional mobility in hallway; posterior LOB x1 with light min assist to correct.      Vision Additional Comments: Appears WFL.   Perception     Praxis      Pertinent Vitals/Pain Pain Assessment: Faces Faces Pain Scale: Hurts a little bit Pain Location: head, forehead Pain Descriptors / Indicators: Headache Pain Intervention(s): Monitored during session     Hand Dominance Right   Extremity/Trunk Assessment Upper Extremity Assessment Upper Extremity  Assessment: LUE deficits/detail LUE Deficits / Details: Mild weakness; overall 4/5.  Reports numbness throughout LUE. LUE Sensation: decreased light touch   Lower Extremity Assessment Lower Extremity Assessment: Defer to PT evaluation       Communication Communication Communication: No difficulties   Cognition Arousal/Alertness: Awake/alert Behavior During Therapy: WFL for tasks assessed/performed Overall Cognitive Status: Within Functional Limits for tasks assessed                     General Comments       Exercises       Shoulder Instructions      Home Living Family/patient expects to be discharged to:: Private residence Living Arrangements: Children (daughter, goes to school and works) Available Help at Discharge: Family;Available PRN/intermittently Type of Home: Apartment Home Access: Level entry     Home Layout: One level     Bathroom Shower/Tub: Tub/shower unit Shower/tub characteristics: Engineer, building services: Standard     Home Equipment: Grab bars - tub/shower          Prior Functioning/Environment Level of Independence: Independent        Comments: Independent with mobility but has noticed increased balance deficits within the past year. Independent with ADL. Does not drive much anymore.        OT Problem List: Decreased strength;Decreased activity tolerance;Impaired balance (sitting and/or standing);Decreased knowledge of use of DME or AE;Decreased knowledge of precautions;Impaired sensation;Obesity;Pain   OT Treatment/Interventions: Self-care/ADL training;Therapeutic exercise;Energy conservation;DME and/or AE instruction;Therapeutic activities;Patient/family education;Balance training    OT Goals(Current goals can be found in the care plan section) Acute Rehab OT Goals Patient Stated Goal: return home OT Goal Formulation: With patient/family Time For Goal Achievement: 07/13/16 Potential to Achieve Goals: Good ADL Goals Pt Will Perform Tub/Shower Transfer: Tub transfer;with modified independence;ambulating;shower  seat Pt/caregiver will Perform Home Exercise Program: Increased strength;Left upper extremity;With theraband;Independently;With written HEP provided  OT Frequency: Min 2X/week   Barriers to D/C: Decreased caregiver support  daughter works/goes to school during the day       Co-evaluation              End of Session Nurse Communication: Mobility status Manufacturing engineer)  Activity Tolerance: Patient tolerated treatment well Patient left: in bed;with call bell/phone within reach;with bed alarm set;with family/visitor present;Other (comment) (MD present in room)   Time: [phone] OT Time Calculation (min): 23 min Charges:  OT General Charges $OT Visit: 1 Procedure OT Evaluation $OT Eval Moderate Complexity: 1 Procedure OT Treatments $Self Care/Home Management : 8-22 mins Gilbert-Codes: OT Gilbert-codes **NOT FOR INPATIENT CLASS** Functional Assessment Tool Used: Clinical judgement Functional Limitation: Self care Self Care Current Status (Y7829): At least 1 percent but less than 20 percent impaired, limited or restricted Self Care Goal Status (F6213): At least 1 percent but less than 20 percent impaired, limited or restricted   Gaye Alken M.S., OTR/L Pager: [phone]  06/29/2016, 8:38 AM

## 2016-06-29 NOTE — H&P (Signed)
History and Physical    ROMONA MURDY ZOX:[id number] DOB: [birth year] DOA: 06/28/2016  PCP: Massie Maroon, FNP  Patient coming from: Home.  Chief Complaint: Left upper extremity numbness and weakness. Headache.  HPI: Julie Gilbert is a 65 y.o. female with migraine, diabetes mellitus, hypertension who was referred to the ER after patient was complaining of left upper extremity numbness and weakness. Patient started having headache since yesterday morning and at around noontime while taking rest patient developed numbness and weakness of the left upper extremity. Denies any associated blurred vision difficulty speaking and swallowing or any weakness of the other extremities. Since symptoms persisted patient went to the urgent care and was referred to the ER. CT head was showing possible old lacunar infarct. On-call neurologist was consulted and admitted for further management. By the time patient reached ER, patient's symptoms have largely resolved but still had headache which improved with Reglan and Benadryl. Presently headache free. Patient states headache was typical of the migraine.  ED Course: Reglan and Benadryl was given for headache and CT head was showing some concerns for lack 95 which could be old.  Review of Systems: As per HPI, rest all negative.   Past Medical History:  Diagnosis Date  . Asthma   . Diabetes mellitus without complication (HCC)   . Hypertension   . IBS (irritable bowel syndrome)     Past Surgical History:  Procedure Laterality Date  . CESAREAN SECTION    . EXPLORATORY LAPAROTOMY       reports that she has quit smoking. She has never used smokeless tobacco. She reports that she drinks about 1.2 oz of alcohol per week . She reports that she does not use drugs.  Allergies  Allergen Reactions  . Peanuts [Peanut Oil] Anaphylaxis  . Shrimp [Shellfish Allergy] Anaphylaxis  . Latex Hives    Family History  Problem Relation Age of Onset  . Cancer  Maternal Grandfather   . Kidney failure Mother   . Hypertension Mother   . Heart disease Father     Prior to Admission medications   Medication Sig Start Date End Date Taking? Authorizing Provider  aspirin 81 MG tablet Take 81 mg by mouth daily.   Yes Historical Provider, MD  aspirin-acetaminophen-caffeine (EXCEDRIN MIGRAINE) [phone] MG tablet Take 2 tablets by mouth every 6 (six) hours as needed for headache.   Yes Historical Provider, MD  Fish Oil-Cholecalciferol (FISH OIL + D3) 1000-1000 MG-UNIT CAPS Take 1 tablet by mouth daily.   Yes Historical Provider, MD  fluticasone (FLONASE) 50 MCG/ACT nasal spray Place 2 sprays into both nostrils daily. 02/03/16  Yes Massie Maroon, FNP  linaclotide [facility]) 145 MCG CAPS capsule Take 1 capsule (145 mcg total) by mouth daily. Patient taking differently: Take 145 mcg by mouth daily as needed (FOR IBS).  02/02/16  Yes Massie Maroon, FNP  losartan (COZAAR) 25 MG tablet Take 1 tablet (25 mg total) by mouth 2 (two) times daily. 09/24/15  Yes Massie Maroon, FNP  metFORMIN (GLUCOPHAGE) 500 MG tablet Take one tablet 2 times a day Patient taking differently: Take 500 mg by mouth 2 (two) times daily with a meal. Take one tablet 2 times a day 06/08/16  Yes Henrietta Hoover, NP  omeprazole (PRILOSEC) 20 MG capsule Take 1 capsule (20 mg total) by mouth daily. 04/26/16  Yes Massie Maroon, FNP  traZODone (DESYREL) 50 MG tablet Take 0.5-1 tablets (25-50 mg total) by mouth at bedtime as needed for  sleep. Patient taking differently: Take 50-100 mg by mouth at bedtime.  06/08/16  Yes Henrietta Hoover, NP  butalbital-acetaminophen-caffeine (FIORICET) [phone] MG tablet Take 1 tablet by mouth every 6 (six) hours as needed for headache. Patient not taking: Reported on 06/28/2016 09/24/15 09/23/16  Massie Maroon, FNP  meclizine (ANTIVERT) 25 MG tablet Take 1 tablet (25 mg total) by mouth every 8 (eight) hours as needed for dizziness. Patient not taking:  Reported on 06/28/2016 04/26/16   Massie Maroon, FNP  SUMAtriptan (IMITREX) 25 MG tablet Take 2 tablets (50 mg total) by mouth once. Maximum daily dose 200mg  Patient not taking: Reported on 06/28/2016 09/24/15   Massie Maroon, FNP    Physical Exam: Vitals:   06/28/16 2315 06/28/16 2330 06/28/16 2356 06/29/16 0000  BP: 125/72 144/96 (!) 176/98 (!) 157/86  Pulse: 60 61 64 65  Resp: 17 22 (!) 22   Temp:   97.9 F (36.6 C) 98.8 F (37.1 C)  TempSrc:   Oral Oral  SpO2: 100% 100% 100% 100%  Weight:   200 lb 4.8 oz (90.9 kg)   Height:   5' 6.5" (1.689 m)       Constitutional: Moderately built and nourished. Vitals:   06/28/16 2315 06/28/16 2330 06/28/16 2356 06/29/16 0000  BP: 125/72 144/96 (!) 176/98 (!) 157/86  Pulse: 60 61 64 65  Resp: 17 22 (!) 22   Temp:   97.9 F (36.6 C) 98.8 F (37.1 C)  TempSrc:   Oral Oral  SpO2: 100% 100% 100% 100%  Weight:   200 lb 4.8 oz (90.9 kg)   Height:   5' 6.5" (1.689 m)    Eyes: Anicteric no pallor. ENMT: No discharge from the ears eyes nose or mouth. Neck: No neck rigidity no mass felt. Respiratory: No rhonchi or crepitations. Cardiovascular: S1 and S2 heard. Abdomen: Soft nontender bowel sounds present. Musculoskeletal: No edema no joint effusions. Skin: No rash. Skin appears warm. Neurologic: No facial asymmetry tongue is midline PERRLA positive. Moves all extremities 5 x 5. Psychiatric: Alert awake oriented to time place and person. Appears normal.   Labs on Admission: I have personally reviewed following labs and imaging studies  CBC:  Recent Labs Lab 06/28/16 1935 06/28/16 1938  WBC 6.0  --   NEUTROABS 2.6  --   HGB 11.5* 12.9  HCT 35.5* 38.0  MCV 86.0  --   PLT 231  --    Basic Metabolic Panel:  Recent Labs Lab 06/28/16 1935 06/28/16 1938  NA 137 138  K 4.4 4.4  CL 105 106  CO2 21*  --   GLUCOSE 111* 113*  BUN 15 20  CREATININE 1.12* 1.10*  CALCIUM 9.9  --    GFR: Estimated Creatinine Clearance: 60.1  mL/min (by C-G formula based on SCr of 1.1 mg/dL (H)). Liver Function Tests:  Recent Labs Lab 06/28/16 1935  AST 19  ALT 17  ALKPHOS 60  BILITOT 0.4  PROT 7.9  ALBUMIN 4.4   No results for input(s): LIPASE, AMYLASE in the last 168 hours. No results for input(s): AMMONIA in the last 168 hours. Coagulation Profile:  Recent Labs Lab 06/28/16 1935  INR 0.94   Cardiac Enzymes: No results for input(s): CKTOTAL, CKMB, CKMBINDEX, TROPONINI in the last 168 hours. BNP (last 3 results) No results for input(s): PROBNP in the last 8760 hours. HbA1C: No results for input(s): HGBA1C in the last 72 hours. CBG:  Recent Labs Lab 06/28/16 1944  GLUCAP 112*  Lipid Profile: No results for input(s): CHOL, HDL, LDLCALC, TRIG, CHOLHDL, LDLDIRECT in the last 72 hours. Thyroid Function Tests: No results for input(s): TSH, T4TOTAL, FREET4, T3FREE, THYROIDAB in the last 72 hours. Anemia Panel: No results for input(s): VITAMINB12, FOLATE, FERRITIN, TIBC, IRON, RETICCTPCT in the last 72 hours. Urine analysis:    Component Value Date/Time   COLORURINE YELLOW 06/28/2016 2030   APPEARANCEUR CLEAR 06/28/2016 2030   LABSPEC 1.009 06/28/2016 2030   PHURINE 5.5 06/28/2016 2030   GLUCOSEU NEGATIVE 06/28/2016 2030   HGBUR NEGATIVE 06/28/2016 2030   BILIRUBINUR NEGATIVE 06/28/2016 2030   KETONESUR NEGATIVE 06/28/2016 2030   PROTEINUR NEGATIVE 06/28/2016 2030   UROBILINOGEN 0.2 12/14/2015 1406   NITRITE NEGATIVE 06/28/2016 2030   LEUKOCYTESUR NEGATIVE 06/28/2016 2030   Sepsis Labs: @LABRCNTIP (procalcitonin:4,lacticidven:4) )No results found for this or any previous visit (from the past 240 hour(s)).   Radiological Exams on Admission: Ct Head Code Stroke W/o Cm  Result Date: 06/28/2016 CLINICAL DATA:  Code stroke. Headache with onset last night and left-sided weakness. EXAM: CT HEAD WITHOUT CONTRAST TECHNIQUE: Contiguous axial images were obtained from the base of the skull through the vertex  without intravenous contrast. COMPARISON:  None. FINDINGS: Brain: Lucency and right anterior caudate head likely represents a chronic lacunar infarct. Slight lucency in the right anterior limb of internal capsule may represent an age indeterminate lacunar infarct or prominent perivascular space. No evidence for large territory infarct, focal mass effect, intracranial hemorrhage, or hydrocephalus. Vascular: No hyperdense vessel or unexpected calcification. Skull: Normal. Negative for fracture or focal lesion. Sinuses/Orbits: No acute finding. Other: None. ASPECTS [facility] Stroke Program Early CT Score) - Ganglionic level infarction (caudate, lentiform nuclei, internal capsule, insula, M1-M3 cortex): 5-6 - Supraganglionic infarction (M4-M6 cortex): 3 Total score (0-10 with 10 being normal): 9 IMPRESSION: 1. 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 Faint asymmetric lucency in right anterior limb of internal capsule may represent an age-indeterminate lacunar infarct or prominent perivascular space. No large territory infarct or hemorrhage identified. 2. ASPECTS is 8-9 These results were called by telephone at the time of interpretation on 06/28/2016 at 7:53 pm to Dr. Margarita Grizzle , who verbally acknowledged these results. Electronically Signed   By: Mitzi Hansen M.D.   On: 06/28/2016 19:54    EKG: Independently reviewed. Normal sinus rhythm.  Assessment/Plan Principal Problem:   Transient ischemic attack (TIA) Active Problems:   Essential hypertension   Diabetes mellitus type 2, controlled (HCC)   Migraine headache   Chronic anemia   TIA (transient ischemic attack)    1. TIA - patient's symptoms are concerning for possible TIA versus migraine associated symptoms. Appreciate neurology consult. Patient has been placed on neurochecks and I have ordered MRI/MRA brain 2-D echo carotid Doppler for further workup on TIA. Patient is on aspirin. Check lipid panel and  hemoglobin A1c. 2. Migraine headache - improved with Reglan and Benadryl and which will be continued when necessary. 3. Hypertension on Cozaar. We'll allow for permissive hypertension until stroke is ruled out. 4. Diabetes mellitus type 2 - hold metformin while inpatient. I have placed patient on sliding scale coverage. 5. Possible chronic kidney disease stage II - creatinine appears to be at baseline. 6. Chronic anemia - follow CBC.   DVT prophylaxis: Lovenox. Code Status: Full code.  Family Communication: Discussed with patient's daughter.  Disposition Plan: Home.  Consults called: Neurology.  Admission status: Observation.    Eduard Clos MD [facility] Pager [phone].  If 7PM-7AM, please contact night-coverage [email or web address] Password [email or web address]  06/29/2016, 1:35 AM

## 2016-06-29 NOTE — Progress Notes (Signed)
Pt being discharged from hospital per orders from MD. Pt and family educated on discharge instructions. Pt and family verbalized understanding of instructions. All questions and concerns were addressed. Pt's IV was removed before discharge. Pt exited hospital via wheelchair. 

## 2016-06-29 NOTE — Evaluation (Addendum)
Physical Therapy Evaluation Patient Details Name: Julie Gilbert MRN: [id number] DOB: 09/19/1951 Today's Date: 06/29/2016   History of Present Illness  65 y.o. female with migraine, diabetes mellitus, hypertension who was referred to the ER after patient was complaining of left upper extremity numbness and weakness. MRI on 10/4 negative for acute infarct. 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   Clinical Impression  Pt admitted with/for left UE numbness and weakness..  Pt currently limited functionally due to the problems listed below.  (see problems list.)  Pt will benefit from PT to maximize function and safety to be able to get home safely with available assist from daughter..     Follow Up Recommendations Outpatient PT    Equipment Recommendations  None recommended by PT    Recommendations for Other Services       Precautions / Restrictions Precautions Precautions: None Restrictions Weight Bearing Restrictions: No      Mobility  Bed Mobility Overal bed mobility: Needs Assistance Bed Mobility: Supine to Sit;Sit to Supine     Supine to sit: Supervision;HOB elevated Sit to supine: Supervision;HOB elevated   General bed mobility comments: Supervision for safety; no physical assist requried. Increased time needed. HOB elevated without use of bed rails.  Transfers Overall transfer level: Needs assistance Equipment used: None Transfers: Sit to/from Stand Sit to Stand: Supervision         General transfer comment: mild unsteadiness that pt controlled well  Ambulation/Gait Ambulation/Gait assistance: Min guard Ambulation Distance (Feet): 280 Feet Assistive device: None Gait Pattern/deviations: Step-through pattern Gait velocity: slower Gait velocity interpretation: Below normal speed for age/gender General Gait Details: mildly unsteady with drift when challenged with scanning, directional and speed changes.  Stairs Stairs:  Yes Stairs assistance: Min guard Stair Management: One rail Left;Step to pattern;Alternating pattern;Forwards Number of Stairs: 5 General stair comments: steady only with rail assist  Wheelchair Mobility    Modified Rankin (Stroke Patients Only)       Balance Overall balance assessment: Needs assistance   Sitting balance-Leahy Scale: Good       Standing balance-Leahy Scale: Fair                               Pertinent Vitals/Pain Pain Assessment: Faces Faces Pain Scale: Hurts even more Pain Location: HA Pain Descriptors / Indicators: Aching Pain Intervention(s): Monitored during session;Repositioned    Home Living Family/patient expects to be discharged to:: Private residence Living Arrangements: Children (daughter, goes to school and works) Available Help at Discharge: Family;Available PRN/intermittently Type of Home: Apartment Home Access: Level entry     Home Layout: One level Home Equipment: Grab bars - tub/shower      Prior Function Level of Independence: Independent         Comments: Independent with mobility but has noticed increased balance deficits within the past year. Independent with ADL. Does not drive much anymore.     Hand Dominance   Dominant Hand: Right    Extremity/Trunk Assessment               Lower Extremity Assessment: RLE deficits/detail;LLE deficits/detail RLE Deficits / Details: functional/WFL LLE Deficits / Details: generally weak at 4/5 with weaker stability muscles/proximal musculature     Communication   Communication: No difficulties  Cognition Arousal/Alertness: Awake/alert Behavior During Therapy: WFL for tasks assessed/performed Overall Cognitive Status: Within Functional Limits for tasks assessed  General Comments General comments (skin integrity, edema, etc.): pt's Headache worsened with gait and balance challenges.    Exercises     Assessment/Plan    PT  Assessment Patient needs continued PT services  PT Problem List Decreased strength;Decreased activity tolerance;Decreased balance;Decreased mobility;Decreased coordination          PT Treatment Interventions Gait training;Functional mobility training;Therapeutic activities;Patient/family education;Balance training    PT Goals (Current goals can be found in the Care Plan section)  Acute Rehab PT Goals Patient Stated Goal: return home PT Goal Formulation: With patient Time For Goal Achievement: 07/06/16 Potential to Achieve Goals: Good Additional Goals Additional Goal #1: DGI score of 20 or greater to correlate with low risk of falls.    Frequency Min 3X/week   Barriers to discharge        Co-evaluation               End of Session   Activity Tolerance: Patient tolerated treatment well;Patient limited by pain Patient left: in bed;with call bell/phone within reach;with family/visitor present Nurse Communication: Mobility status    Functional Assessment Tool Used: clinical judgement Functional Limitation: Mobility: Walking and moving around Mobility: Walking and Moving Around Current Status ([id number](G8978): At least 1 percent but less than 20 percent impaired, limited or restricted Mobility: Walking and Moving Around Goal Status [phone]([id number]): At least 1 percent but less than 20 percent impaired, limited or restricted    Time: 1202-1224 PT Time Calculation (min) (ACUTE ONLY): 22 min   Charges:   PT Evaluation $PT Eval Moderate Complexity: 1 Procedure     PT G Codes:   PT G-Codes **NOT FOR INPATIENT CLASS** Functional Assessment Tool Used: clinical judgement Functional Limitation: Mobility: Walking and moving around Mobility: Walking and Moving Around Current Status ([id number](G8978): At least 1 percent but less than 20 percent impaired, limited or restricted Mobility: Walking and Moving Around Goal Status [phone](G8979): At least 1 percent but less than 20 percent impaired, limited or restricted     Anberlyn Feimster, Eliseo GumKenneth V 06/29/2016, 12:52 PM 06/29/2016  Shelby BingKen Liliana Brentlinger, PT [phone]  (pager)

## 2016-06-29 NOTE — Telephone Encounter (Signed)
Please schedule patient for an OV. Thank you

## 2016-06-29 NOTE — Progress Notes (Signed)
**  Preliminary report by tech**  Carotid artery duplex completed. Findings are consistent with a 1-39 percent stenosis involving the right internal carotid artery and the left internal carotid artery. The vertebral arteries demonstrate antegrade flow.  06/29/16 11:23 AM Olen CordialGreg Melissa Tomaselli RVT

## 2016-06-30 ENCOUNTER — Telehealth: Payer: Self-pay | Admitting: Hematology

## 2016-06-30 LAB — HEMOGLOBIN A1C
HEMOGLOBIN A1C: 6.4 % — AB (ref 4.8–5.6)
MEAN PLASMA GLUCOSE: 137 mg/dL

## 2016-06-30 NOTE — Telephone Encounter (Signed)
Patient's sister called in stating the patient was having issues with metformin and had been to the ED several times.  Sister was very upset and feels like patient's care is not being managed correctly.  I explained that that patient saw provider in 11-2015, then cancelled the next two appointments (02-2016, 04-2016).  I advised that I could not speak on the medical care that was provided, but I did offer the patient an appointment to see the current provider which is Concepcion LivingLinda Bernhardt.  Patient's sister refused to be seen by Bonita QuinLinda stating she didn't like previous interaction with provider.   I then offered an appointment with Dr. Hyman HopesJegede, medical provider on 08-09-2016 but sister refused this as well.  She then wanted a "referral" to another provider.  I explained that a referral is not required, the patient just needs to find another PCP.  Sister then states she has been calling but can not get a fast appointment.  She then refused again to see Concepcion LivingLinda Bernhardt.  Sister started discussing funding and stating she "knows we are getting money and not caring for patient's correctly".  I advised sister that I would not discuss this with her and offered to schedule an appointment.  Sister refused and disconnected the call.

## 2016-07-07 ENCOUNTER — Encounter: Payer: Self-pay | Admitting: Licensed Clinical Social Worker

## 2016-07-07 ENCOUNTER — Ambulatory Visit: Payer: Self-pay | Attending: Internal Medicine | Admitting: Internal Medicine

## 2016-07-07 ENCOUNTER — Inpatient Hospital Stay: Payer: Self-pay

## 2016-07-07 ENCOUNTER — Encounter: Payer: Self-pay | Admitting: Internal Medicine

## 2016-07-07 VITALS — BP 161/84 | HR 64 | Temp 98.4°F | Resp 18 | Ht 66.0 in | Wt 199.0 lb

## 2016-07-07 DIAGNOSIS — G43009 Migraine without aura, not intractable, without status migrainosus: Secondary | ICD-10-CM

## 2016-07-07 DIAGNOSIS — R112 Nausea with vomiting, unspecified: Secondary | ICD-10-CM | POA: Insufficient documentation

## 2016-07-07 DIAGNOSIS — Z79899 Other long term (current) drug therapy: Secondary | ICD-10-CM | POA: Insufficient documentation

## 2016-07-07 DIAGNOSIS — G43909 Migraine, unspecified, not intractable, without status migrainosus: Secondary | ICD-10-CM | POA: Insufficient documentation

## 2016-07-07 DIAGNOSIS — Z888 Allergy status to other drugs, medicaments and biological substances status: Secondary | ICD-10-CM | POA: Insufficient documentation

## 2016-07-07 DIAGNOSIS — H8110 Benign paroxysmal vertigo, unspecified ear: Secondary | ICD-10-CM | POA: Insufficient documentation

## 2016-07-07 DIAGNOSIS — K581 Irritable bowel syndrome with constipation: Secondary | ICD-10-CM | POA: Insufficient documentation

## 2016-07-07 DIAGNOSIS — Z8673 Personal history of transient ischemic attack (TIA), and cerebral infarction without residual deficits: Secondary | ICD-10-CM | POA: Insufficient documentation

## 2016-07-07 DIAGNOSIS — I10 Essential (primary) hypertension: Secondary | ICD-10-CM | POA: Insufficient documentation

## 2016-07-07 DIAGNOSIS — Z7982 Long term (current) use of aspirin: Secondary | ICD-10-CM | POA: Insufficient documentation

## 2016-07-07 DIAGNOSIS — F331 Major depressive disorder, recurrent, moderate: Secondary | ICD-10-CM | POA: Insufficient documentation

## 2016-07-07 DIAGNOSIS — J45909 Unspecified asthma, uncomplicated: Secondary | ICD-10-CM | POA: Insufficient documentation

## 2016-07-07 DIAGNOSIS — K5900 Constipation, unspecified: Secondary | ICD-10-CM | POA: Insufficient documentation

## 2016-07-07 DIAGNOSIS — Z7984 Long term (current) use of oral hypoglycemic drugs: Secondary | ICD-10-CM | POA: Insufficient documentation

## 2016-07-07 DIAGNOSIS — E118 Type 2 diabetes mellitus with unspecified complications: Secondary | ICD-10-CM | POA: Insufficient documentation

## 2016-07-07 LAB — GLUCOSE, POCT (MANUAL RESULT ENTRY): POC GLUCOSE: 157 mg/dL — AB (ref 70–99)

## 2016-07-07 MED ORDER — SUMATRIPTAN SUCCINATE 25 MG PO TABS: 50.0000 mg | ORAL_TABLET | Freq: Once | ORAL | 0 refills | Status: DC

## 2016-07-07 MED ORDER — MECLIZINE HCL 25 MG PO TABS: 25.0000 mg | ORAL_TABLET | Freq: Three times a day (TID) | ORAL | 3 refills | Status: DC | PRN

## 2016-07-07 MED ORDER — ATORVASTATIN CALCIUM 40 MG PO TABS: 40.0000 mg | ORAL_TABLET | Freq: Every day | ORAL | 3 refills | Status: DC

## 2016-07-07 MED ORDER — LINACLOTIDE 145 MCG PO CAPS: 145.0000 ug | ORAL_CAPSULE | Freq: Every day | ORAL | 3 refills | Status: DC | PRN

## 2016-07-07 MED ORDER — BUTALBITAL-APAP-CAFFEINE 50-325-40 MG PO TABS: 1.0000 | ORAL_TABLET | Freq: Four times a day (QID) | ORAL | 0 refills | Status: DC | PRN

## 2016-07-07 MED ORDER — METFORMIN HCL 500 MG PO TABS: 500.0000 mg | ORAL_TABLET | Freq: Two times a day (BID) | ORAL | 3 refills | Status: DC

## 2016-07-07 MED ORDER — LOSARTAN POTASSIUM 25 MG PO TABS
25.0000 mg | ORAL_TABLET | Freq: Two times a day (BID) | ORAL | 3 refills | Status: DC
Start: 2016-07-07 — End: 2016-07-12

## 2016-07-07 MED ORDER — NORTRIPTYLINE HCL 25 MG PO CAPS: 25.0000 mg | ORAL_CAPSULE | Freq: Every day | ORAL | 3 refills | Status: DC

## 2016-07-07 MED FILL — LOSARTAN POTASSIUM 25 MG TA: 25 | 30 days supply | Qty: 60 | Fill #0

## 2016-07-07 MED FILL — ATORVASTATIN 40 MG TABLET: 40 | 30 days supply | Qty: 30 | Fill #0

## 2016-07-07 MED FILL — ?MECLIZINE 25 MG TABLET: 25 | 10 days supply | Qty: 30 | Fill #0

## 2016-07-07 MED FILL — metFORMIN HCL 500 MG TABS: 500 | 30 days supply | Qty: 60 | Fill #0

## 2016-07-07 MED FILL — NORTRIPTYLINE HCL 25 MG CAP: 25 | 30 days supply | Qty: 30 | Fill #0

## 2016-07-07 NOTE — Progress Notes (Signed)
Julie Gilbert, is a 65 y.o. female  ZOX:[id number]:[id number]  [id number]:[id number]  DOB - Jan 20, 1951  Chief Complaint  Patient presents with  . Hospitalization Follow-up    MIGRAINE       Subjective:   Julie Dockernnie Elting is a 65 y.o. female with history of hypertension, type 2 diabetes mellitus, IBS and Asthma, hosptialized between 06/28/2016-06/29/2016 for Transient ischemic attack (TIA), undergoing PT/OT here today for a hospital discharge follow up visit and medication refills. Patient continues to have headache associated with nausea and occasional vomiting. 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 MRA head was negative. Carotid doppler revealed no hemodynamically significant stenosis. TTE revealed LVEF 60-65% with no cardiac source of emboli. She is on ASA 81mg  daily for secondary stroke prevention. She is on Lipitor 40mg  daily. She has had migraines for about 3 years usually located bi-frontal, of pounding quality and usually 10/10 intensity. They are associated with nausea, vomiting, photophobia, phonophobia and blurred vision. They typically last all day and occur 2 days per week.  They are not preceded by aura. She is concerned about all her medical conditions to the extent that she is depressed, feeling overwhelmed. She is tearful today but denies any suicidal ideation or thoughts. She denies any blurry vision at the moment. Patient has No chest pain, No abdominal pain, No new weakness tingling or numbness, No Cough - SOB. Patient has not been seen by a Neurologist.  No problems updated.  ALLERGIES: Allergies  Allergen Reactions  . Peanuts [Peanut Oil] Anaphylaxis  . Shrimp [Shellfish Allergy] Anaphylaxis  . Latex Hives    PAST MEDICAL HISTORY: Past Medical History:  Diagnosis Date  . Asthma   . Diabetes mellitus without complication (HCC)   .  Hypertension   . IBS (irritable bowel syndrome)     MEDICATIONS AT HOME: Prior to Admission medications   Medication Sig Start Date End Date Taking? Authorizing Provider  aspirin 81 MG tablet Take 81 mg by mouth daily.   Yes Historical Provider, MD  atorvastatin (LIPITOR) 40 MG tablet Take 1 tablet (40 mg total) by mouth daily at 6 PM. 07/07/16  Yes Quentin Angstlugbemiga E Keelen Quevedo, MD  Fish Oil-Cholecalciferol (FISH OIL + D3) 1000-1000 MG-UNIT CAPS Take 1 tablet by mouth daily.   Yes Historical Provider, MD  fluticasone (FLONASE) 50 MCG/ACT nasal spray Place 2 sprays into both nostrils daily. 02/03/16  Yes Massie MaroonLachina M Hollis, FNP  linaclotide (LINZESS) 145 MCG CAPS capsule Take 1 capsule (145 mcg total) by mouth daily as needed (FOR IBS). 07/07/16  Yes Quentin Angstlugbemiga E Priscila Bean, MD  losartan (COZAAR) 25 MG tablet Take 1 tablet (25 mg total) by mouth 2 (two) times daily. 07/07/16  Yes Quentin Angstlugbemiga E [place] Zaragoza, MD  metFORMIN (GLUCOPHAGE) 500 MG tablet Take 1 tablet (500 mg total) by mouth 2 (two) times daily with a meal. 07/07/16  Yes Jyron Turman E Hyman HopesJegede, MD  omeprazole (PRILOSEC) 20 MG capsule Take 1 capsule (20 mg total) by mouth daily. 04/26/16  Yes Massie MaroonLachina M Hollis, FNP  SUMAtriptan (IMITREX) 25 MG tablet Take 2 tablets (50 mg total) by mouth once. Maximum daily dose 200mg  07/07/16 07/07/16 Yes [place] Want Annitta NeedsE Reginia Battie, MD  butalbital-acetaminophen-caffeine (FIORICET) 50-325-40 MG tablet Take 1 tablet by mouth every 6 (six) hours as needed for headache. 07/07/16 07/07/17  Quentin Angstlugbemiga E Dhani Imel, MD  meclizine (ANTIVERT) 25 MG tablet Take 1 tablet (25 mg total)  by mouth every 8 (eight) hours as needed for dizziness. 07/07/16   Quentin Angst, MD  nortriptyline (PAMELOR) 25 MG capsule Take 1 capsule (25 mg total) by mouth at bedtime. 07/07/16   Quentin Angst, MD    Objective:   Vitals:   07/07/16 1126  BP: (!) 161/84  Pulse: 64  Resp: 18  Temp: 98.4 F (36.9 C)  TempSrc: Oral  SpO2: 100%  Weight: 199 lb (90.3  kg)  Height: 5\' 6"  (1.676 m)   Exam General appearance : Awake, alert, not in any distress. Speech Clear. Not toxic looking HEENT: Atraumatic and Normocephalic, pupils equally reactive to light and accomodation Neck: Supple, no JVD. No cervical lymphadenopathy.  Chest: Good air entry bilaterally, no added sounds  CVS: S1 S2 regular, no murmurs.  Abdomen: Bowel sounds present, Non tender and not distended with no gaurding, rigidity or rebound. Extremities: B/L Lower Ext shows no edema, both legs are warm to touch Neurology: Awake alert, and oriented X 3, CN II-XII intact, Non focal Skin: No Rash  Data Review Lab Results  Component Value Date   HGBA1C 6.4 (H) 06/29/2016   HGBA1C 6.3 (H) 12/14/2015   HGBA1C 7.2 (H) 08/12/2015    Assessment & Plan   1. Controlled type 2 diabetes mellitus with complication, without long-term current use of insulin (HCC)  - Microalbumin/Creatinine Ratio, Urine - Glucose (CBG) - metFORMIN (GLUCOPHAGE) 500 MG tablet; Take 1 tablet (500 mg total) by mouth 2 (two) times daily with a meal.  Dispense: 180 tablet; Refill: 3 - atorvastatin (LIPITOR) 40 MG tablet; Take 1 tablet (40 mg total) by mouth daily at 6 PM.  Dispense: 90 tablet; Refill: 3  2. Nonintractable migraine, unspecified migraine type  - SUMAtriptan (IMITREX) 25 MG tablet; Take 2 tablets (50 mg total) by mouth once. Maximum daily dose 200mg   Dispense: 10 tablet; Refill: 0 - butalbital-acetaminophen-caffeine (FIORICET) 50-325-40 MG tablet; Take 1 tablet by mouth every 6 (six) hours as needed for headache.  Dispense: 30 tablet; Refill: 0 - nortriptyline (PAMELOR) 25 MG capsule; Take 1 capsule (25 mg total) by mouth at bedtime.  Dispense: 30 capsule; Refill: 3  - Ambulatory referral to Neurology  3. Benign paroxysmal positional vertigo, unspecified laterality  - meclizine (ANTIVERT) 25 MG tablet; Take 1 tablet (25 mg total) by mouth every 8 (eight) hours as needed for dizziness.  Dispense: 30  tablet; Refill: 3  4. Irritable bowel syndrome with constipation  - linaclotide (LINZESS) 145 MCG CAPS capsule; Take 1 capsule (145 mcg total) by mouth daily as needed (FOR IBS).  Dispense: 90 capsule; Refill: 3  5. Essential hypertension: Uncontrolled  - losartan (COZAAR) 25 MG tablet; Take 1 tablet (25 mg total) by mouth 2 (two) times daily.  Dispense: 180 tablet; Refill: 3  6. Moderate episode of recurrent major depressive disorder (HCC)  - nortriptyline (PAMELOR) 25 MG capsule; Take 1 capsule (25 mg total) by mouth at bedtime.  Dispense: 30 capsule; Refill: 3 - Patient was seen and counseled by our LCSW today - Ambulatory referral to Psychiatry  Patient have been counseled extensively about nutrition and exercise. Other issues discussed during this visit include: low cholesterol diet, weight control and daily exercise, foot care, annual eye examinations at Ophthalmology, importance of adherence with medications and regular follow-up. We also discussed long term complications of uncontrolled diabetes and hypertension.   Return in about 4 weeks (around 08/04/2016) for Follow up HTN, Depression and Anxiety, Follow up Pain and comorbidities.  The  patient was given clear instructions to go to ER or return to medical center if symptoms don't improve, worsen or new problems develop. The patient verbalized understanding. The patient was told to call to get lab results if they haven't heard anything in the next week.   This note has been created with Education officer, environmental. Any transcriptional errors are unintentional.    Jeanann Lewandowsky, MD, MHA, FACP, FAAP, CPE [facility] [place], [place] [phone]   07/07/2016, 12:23 PM

## 2016-07-07 NOTE — Progress Notes (Signed)
Patient is here for HFU MIGRAINE  Patient has only taken Prilosec today.  Patient request refill on Antivert.  Patient complains of migraine being present since Monday.  Patient has not had a BM since last Wednesday.  Patient denies any suicidal ideations at this time. Patient spoke with LCSW in office today.

## 2016-07-07 NOTE — Progress Notes (Signed)
Session Start time: 12:10 pm   End Time: 12:40 pm Total Time:  30 minutes Type of Service: Behavioral Health - Individual/Family Interpreter: No.   Interpreter Name & Language: N/A # [facility]: 1   SUBJECTIVE: Julie Gilbert is a 65 y.o. female  Pt. was referred by Dr. Hyman HopesJegede for:  anxiety and depression. Pt. reports the following symptoms/concerns: feelings of sadness, lack of motivation, low self-esteem, difficulty sleeping, panic attacks  Duration of problem:  Pt has suffered with depression and anxiety since 65 y.o.  Severity: Moderate Previous treatment: Pt has never had previous treatment   OBJECTIVE: Mood: Anxious and Depressed & Affect: Tearful Risk of harm to self or others: Denies SI/HI Assessments administered: PHQ-9; GAD-7  LIFE CONTEXT:  Family & Social: Pt resides with adult daughter. She has a sister who she is "close" with in [place], [place] School/ Work: Pt is unemployed due to a decrease in her physical health. She has a Disability hearing scheduled on 07/11/16 Self-Care: Pt enjoys walking and talking with her sister Life changes: Pt is recently divorced and grieves the loss of both parents. Pt recently [place], [place] months ago What is important to pt/family (values): Spirituality, Family, Health   GOALS ADDRESSED:  To decrease symptoms of Depression To decrease symptoms of Anxiety   INTERVENTIONS: Motivational Interviewing, Strength-based and Supportive   ASSESSMENT:  Pt currently experiencing anxiety and depression triggered by declining health, the death of her parents, and recent divorce after 26 years of marriage. Pt may benefit from psychoeducation and utilizing healthy coping skills to decrease anxiety and depression. LCSWA educated pt on the cycle of depression and discussed different activities pt can participate in to decrease symptoms.      PLAN: 1. F/U with behavioral health clinician: LCSWA will follow up  with pt to inquire about healthy coping skills discussed during visit. Pt was encouraged to contact LCSWA if symptoms worsen or fail to improve to [facility]. 2. Behavioral Health meds: None indicated 3. Behavioral recommendations: LCSWA recommends that pt apply healthy coping skills discussed. Pt is encouraged to schedule follow up appointment with LCSWA 4. Referral: Brief Counseling/Psychotherapy, [facility], Psychoeducation and Supportive Counseling 5. From scale of 1-10, how likely are you to follow plan: 10/10    Warmhandoff:   Warm Hand Off Completed.      Jenel LucksJasmine Lewis, MSW, LCSWA 07/07/16 5:29 PM

## 2016-07-12 ENCOUNTER — Other Ambulatory Visit: Payer: Self-pay

## 2016-07-12 DIAGNOSIS — I10 Essential (primary) hypertension: Secondary | ICD-10-CM

## 2016-07-12 DIAGNOSIS — K219 Gastro-esophageal reflux disease without esophagitis: Secondary | ICD-10-CM

## 2016-07-12 MED ORDER — OMEPRAZOLE 20 MG PO CPDR: 20.0000 mg | DELAYED_RELEASE_CAPSULE | Freq: Every day | ORAL | 0 refills | Status: DC

## 2016-07-12 MED ORDER — LOSARTAN POTASSIUM 25 MG PO TABS: 25.0000 mg | ORAL_TABLET | Freq: Two times a day (BID) | ORAL | 0 refills | Status: DC

## 2016-07-12 NOTE — Telephone Encounter (Signed)
Refills for omeprazole and losartan sent into pharmacy. Thanks!

## 2016-07-14 ENCOUNTER — Other Ambulatory Visit: Payer: Self-pay | Admitting: Family Medicine

## 2016-07-14 DIAGNOSIS — G43009 Migraine without aura, not intractable, without status migrainosus: Secondary | ICD-10-CM

## 2016-07-14 MED FILL — SUMATRIPTAN SUCC 25 MG TAB: 25 | 26 days supply | Qty: 9 | Fill #0

## 2016-07-18 ENCOUNTER — Ambulatory Visit: Payer: No Typology Code available for payment source | Admitting: Occupational Therapy

## 2016-07-18 ENCOUNTER — Ambulatory Visit: Payer: No Typology Code available for payment source | Attending: Family Medicine | Admitting: Rehabilitation

## 2016-07-18 DIAGNOSIS — I69918 Other symptoms and signs involving cognitive functions following unspecified cerebrovascular disease: Secondary | ICD-10-CM | POA: Insufficient documentation

## 2016-07-18 DIAGNOSIS — R278 Other lack of coordination: Secondary | ICD-10-CM | POA: Insufficient documentation

## 2016-07-18 DIAGNOSIS — R2681 Unsteadiness on feet: Secondary | ICD-10-CM | POA: Insufficient documentation

## 2016-07-18 DIAGNOSIS — M6281 Muscle weakness (generalized): Secondary | ICD-10-CM | POA: Insufficient documentation

## 2016-07-18 DIAGNOSIS — R2689 Other abnormalities of gait and mobility: Secondary | ICD-10-CM | POA: Insufficient documentation

## 2016-07-20 ENCOUNTER — Encounter: Payer: Self-pay | Admitting: Physical Therapy

## 2016-07-20 ENCOUNTER — Ambulatory Visit: Payer: No Typology Code available for payment source | Admitting: *Deleted

## 2016-07-20 ENCOUNTER — Ambulatory Visit: Payer: No Typology Code available for payment source | Admitting: Physical Therapy

## 2016-07-20 ENCOUNTER — Encounter: Payer: Self-pay | Admitting: *Deleted

## 2016-07-20 DIAGNOSIS — R2689 Other abnormalities of gait and mobility: Secondary | ICD-10-CM

## 2016-07-20 DIAGNOSIS — M6281 Muscle weakness (generalized): Secondary | ICD-10-CM

## 2016-07-20 DIAGNOSIS — R278 Other lack of coordination: Secondary | ICD-10-CM

## 2016-07-20 DIAGNOSIS — I69918 Other symptoms and signs involving cognitive functions following unspecified cerebrovascular disease: Secondary | ICD-10-CM

## 2016-07-20 DIAGNOSIS — R2681 Unsteadiness on feet: Secondary | ICD-10-CM

## 2016-07-20 NOTE — Therapy (Signed)
[facility] [address] [place], [place], [address] Phone: [phone]   Fax:  [phone]  Physical Therapy Evaluation  Patient Details  Name: Julie Gilbert MRN: [id number] Date of Birth: 12-Jan-1951 Referring Provider: PCP - Julianne Handler, FNP (Hospitalist - Unice Bailey, MD)  Encounter Date: 07/20/2016      PT End of Session - 07/20/16 1501    Visit Number 1   Number of Visits 13   Date for PT Re-Evaluation 09/02/16   PT Start Time 1022   PT Stop Time 1102   PT Time Calculation (min) 40 min   Equipment Utilized During Treatment Gait belt   Activity Tolerance Patient tolerated treatment well   Behavior During Therapy Flat affect;WFL for tasks assessed/performed      Past Medical History:  Diagnosis Date  . Asthma   . Diabetes mellitus without complication (HCC)   . Hypertension   . IBS (irritable bowel syndrome)     Past Surgical History:  Procedure Laterality Date  . CESAREAN SECTION    . EXPLORATORY LAPAROTOMY      There were no vitals filed for this visit.       Subjective Assessment - 07/20/16 1021    Subjective Patient was hosptialized 06/28/2016-06/29/2016 for Transient ischemic attack (TIA). She referred to PT & OT outpatient for further evaluation. Pt reports "I don't know if I had a stroke or not.  I've had migraines and veritgo for a long time now." She reports they are worse since 06/28/2016 incident. She states she had numbness and weakness in her L UE and LE "down to my knee".  She also states she is dropping things with the L hand and loosing balance including a fall.    Pertinent History HTN, asthma, chronic amemia, migraine HA, DM2, IBS   Limitations Walking;House hold activities;Lifting   Patient Stated Goals "I'd like to be able to get back to walking 2 miles a day."   Currently in Pain? Yes   Pain Score 8   Worst: 10/10; Best: 0/10   Pain Location Head  HA; migraine   Pain  Descriptors / Indicators Pounding   Pain Type Chronic pain   Pain Onset Today   Pain Frequency Intermittent   Pain Relieving Factors Rubbing her head; medication   Multiple Pain Sites No            [facility] PT Assessment - 07/20/16 1015      Assessment   Medical Diagnosis Transient Cerebral ischemia   Referring Provider PCP - Julianne Handler, FNP  Hospitalist - Unice Bailey, MD   Onset Date/Surgical Date 06/28/16   Hand Dominance Right     Precautions   Precautions Fall  Dr. advised her to buy a cane     Balance Screen   Has the patient fallen in the past 6 months Yes   How many times? 2  left leg weakness   Has the patient had a decrease in activity level because of a fear of falling?  Yes   Is the patient reluctant to leave their home because of a fear of falling?  Yes     Home Environment   Living Environment Private residence   Living Arrangements Children  Adult Daughter   Available Help at Discharge Family   Type of Home Apartment  1st floor   Home Access Stairs to enter  one step up   Entrance Stairs-Number of Steps 1   Entrance Stairs-Rails None  Home Layout One level   Home Equipment Grab bars - tub/shower     Prior Function   Level of Independence Independent   Vocation On disability     Observation/Other Assessments   Focus on Therapeutic Outcomes (FOTO)  45.63 Functional Status     Sensation   Light Touch Appears Intact     Coordination   Gross Motor Movements are Fluid and Coordinated Yes   Fine Motor Movements are Fluid and Coordinated No   Heel Shin Test Impaired on Lt     ROM / Strength   AROM / PROM / Strength AROM;Strength     AROM   Overall AROM  Within functional limits for tasks performed     Strength   Overall Strength Deficits   Strength Assessment Site Hip;Knee;Ankle   Right/Left Hip Right;Left   Right Hip Flexion 4/5   Right Hip Extension 4/5  Tested in standing   Right Hip ABduction 3+/5  Tested in standing   Left  Hip Flexion 3-/5   Left Hip Extension 3/5  Tested in standing   Left Hip ABduction 3-/5  Tested in standing   Right/Left Knee Right;Left   Right Knee Flexion 4-/5  Tested in standing and seated   Right Knee Extension 4/5   Left Knee Flexion 3/5  Tested in standing and seated   Left Knee Extension 3+/5   Right/Left Ankle Right;Left   Right Ankle Dorsiflexion 4+/5   Left Ankle Dorsiflexion 3/5     Transfers   Transfers Sit to Stand;Stand to Sit;Stand Pivot Transfers   Sit to Stand 5: Supervision;With upper extremity assist;With armrests;From chair/3-in-1   Stand to Sit 5: Supervision;With upper extremity assist;With armrests;To chair/3-in-1   Stand Pivot Transfers 5: Supervision;With armrests     Ambulation/Gait   Ambulation/Gait Yes   Ambulation/Gait Assistance 5: Supervision   Ambulation/Gait Assistance Details Pt deviates from path with head turns to scan environment to right & left.    Ambulation Distance (Feet) 60 Feet  2x60', 2x40'   Assistive device None  Pt states she is buying a SPC after session   Gait Pattern Step-through pattern;Decreased arm swing - right;Decreased arm swing - left;Decreased stride length;Decreased hip/knee flexion - left;Decreased dorsiflexion - left;Trunk flexed;Narrow base of support;Poor foot clearance - left;Poor foot clearance - right;Decreased hip/knee flexion - right   Ambulation Surface Level;Indoor   Gait velocity 3.54 ft/sec  Indicates community ambulator     Standardized Balance Assessment   Standardized Balance Assessment Hospital doctorBerg Balance Test     Berg Balance Test   Sit to Stand Able to stand  independently using hands   Standing Unsupported Able to stand safely 2 minutes   Sitting with Back Unsupported but Feet Supported on Floor or Stool Able to sit safely and securely 2 minutes   Stand to Sit Uses backs of legs against chair to control descent   Transfers Able to transfer with verbal cueing and /or supervision   Standing Unsupported  with Eyes Closed Able to stand 10 seconds with supervision   Standing Ubsupported with Feet Together Able to place feet together independently and stand for 1 minute with supervision   From Standing, Reach Forward with Outstretched Arm Can reach forward >12 cm safely (5")   From Standing Position, Pick up Object from Floor Able to pick up shoe, needs supervision   From Standing Position, Turn to Look Behind Over each Shoulder Needs supervision when turning   Turn 360 Degrees Needs close supervision or verbal cueing  Standing Unsupported, Alternately Place Feet on Step/Stool Able to complete 4 steps without aid or supervision   Standing Unsupported, One Foot in Front Able to plae foot ahead of the other independently and hold 30 seconds   Standing on One Leg Tries to lift leg/unable to hold 3 seconds but remains standing independently   Total Score 35   Berg comment: Indicates significant risk for falls.                           PT Education - 07/20/16 1500    Education provided Yes   Education Details Pt educated on eval findings, goals, POC, and future prognosis.   Person(s) Educated Patient   Methods Explanation   Comprehension Verbalized understanding          PT Short Term Goals - 07/20/16 1521      PT SHORT TERM GOAL #1   Title Pt will be independent and verbalize understanding of initial HEP to continue progress in therapy. (Target Date: 08/12/16)   Time 3   Period Weeks   Status New     PT SHORT TERM GOAL #2   Title Pt will improve Berg Balance score to > or = 40/56 to indicate improved static balance. (Target Date: 08/12/16)   Time 3   Period Weeks   Status New     PT SHORT TERM GOAL #3   Title Pt will improve FGA score by 4 points from baseline to indicate improved functional mobility. (Target Date: 08/12/16)   Time 3   Period Weeks   Status New     PT SHORT TERM GOAL #4   Title Pt will ambulate 500' with LRAD and suprevision indoors over  level surfaces, ramps, and curbs with no LOB to incr safety when ambulating at home. (Target Date: 08/12/16)   Time 3   Period Weeks   Status New           PT Long Term Goals - 07/20/16 1525      PT LONG TERM GOAL #1   Title Pt will be independent and verbalize understanding of HEP and on-going fitnes plan to maintain progress made in therapy and improve overall health. (Target Date: 09/02/16)   Time 6   Period Weeks   Status New     PT LONG TERM GOAL #2   Title Pt will improve Berg Balance score to > or = 45/56 to indicate decr risk of falls. (Target Date: 09/02/16)   Time 6   Period Weeks   Status New     PT LONG TERM GOAL #3   Title Pt will improve FGA score by 8 points from baseline to indicate decr risk of falls and improved functional mobility. (Target Date: 09/02/16)   Time 6   Period Weeks   Status New     PT LONG TERM GOAL #4   Title Pt will ambulate 1000' with LRAD and mod I outdoors over unlevel surfaces, pavement, grass, gravel, ramps, and curbs to incr safety when ambulating in the community. (Target Date: 09/02/16)   Time 6   Period Weeks   Status New               Plan - 07/20/16 1502    Clinical Impression Statement Pt is a 65 year old female who presents to outpatient PT with a diagnosis of transient cerebral ischemia (06/28/16) with resulting L UE and LE weakness and hosiptalization (06/28/16 - 06/29/16).  PMH sigificant for HTN, DM2, migraine HA, asthma, IBS, and chronic anemia.  Pt has high fall risk as noted by a Berg balance score of 35/56.  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  Patient has deviation from path with head turns to scan environment. FGA was not assessed during session due to time restraints but will be addressed in next session. She demonstrates decr strength in the L LE that could potentially be contributing to her lack of balance.  Pt also reports chronic bouts of vertigo that limit her in her  ability to perform ADLs and causes her to lose her balance which she reports have increased.  This was not tested during initial eval, but will be monitored in future sessions. Pt condition appears to be evolving and of moderate complexity.  She would benefit from skilled PT to address her said impairments.   Rehab Potential Good   Clinical Impairments Affecting Rehab Potential HTN, DM2, asthma, chronic anemia   PT Frequency 2x / week   PT Duration 6 weeks   PT Treatment/Interventions ADLs/Self Care Home Management;Canalith Repostioning;Functional mobility training;Stair training;Gait training;DME Instruction;Therapeutic activities;Therapeutic exercise;Balance training;Neuromuscular re-education;Patient/family education;Manual techniques;Energy conservation;Vestibular   PT Next Visit Plan Initiate HEP for strength & balance, Perform FGA, functional balance and gait activities, strengthen L LE   Consulted and Agree with Plan of Care Patient      Patient will benefit from skilled therapeutic intervention in order to improve the following deficits and impairments:  Abnormal gait, Decreased activity tolerance, Decreased balance, Decreased knowledge of use of DME, Decreased endurance, Decreased coordination, Decreased safety awareness, Decreased strength, Dizziness, Impaired perceived functional ability, Improper body mechanics, Impaired UE functional use, Pain  Visit Diagnosis: Other abnormalities of gait and mobility - Plan: PT plan of care cert/re-cert  Unsteadiness on feet - Plan: PT plan of care cert/re-cert  Muscle weakness (generalized) - Plan: PT plan of care cert/re-cert  Other lack of coordination - Plan: PT plan of care cert/re-cert     Problem List Patient Active Problem List   Diagnosis Date Noted  . Chronic anemia 06/29/2016  . TIA (transient ischemic attack) 06/29/2016  . Transient ischemic attack (TIA) 06/28/2016  . Arthralgia 08/12/2015  . Gastroesophageal reflux disease  without esophagitis 06/18/2015  . History of migraine headaches 05/13/2015  . Essential hypertension 05/13/2015  . Diabetes mellitus type 2, controlled (HCC) 05/13/2015  . IBS (irritable bowel syndrome) 05/13/2015  . Migraine headache 05/13/2015  . Depression 05/13/2015  . Insomnia 05/13/2015    Vilinda Flake, SPT 07/21/2016, 8:50 AM  Vladimir Faster, PT, DPT PT Specializing in Prosthetics & Orthotics 07/21/16 9:07 AM Phone:  [phone]  Fax:  [phone] [facility] [address] [place], [place] [address]  [facility] [address] [place], [place], [address] Phone: [phone]   Fax:  [phone]  Name: Julie Gilbert MRN: [id number] Date of Birth: May 13, 1951

## 2016-07-20 NOTE — Patient Instructions (Signed)
  Coordination Activities  Perform the following activities for 10 minutes 2-3 times per day with left hand(s).   Rotate ball in fingertips (clockwise and counter-clockwise).  Toss ball between hands.  Toss ball in air and catch with the same hand.  Flip cards 1 at a time as fast as you can.  Deal cards with your thumb (Hold deck in hand and push card off top with thumb).  Shuffle cards.  Pick up coins and place in container or coin bank.  Pick up coins and stack.  Pick up coins one at a time until you get 5-10 in your hand, then move coins from palm to fingertips to stack one at a time.  Screw together nuts and bolts, then unfasten.

## 2016-07-20 NOTE — Therapy (Signed)
[facility] [address] [place], [place], [address] Phone: [phone]   Fax:  [phone]  Occupational Therapy Evaluation  Patient Details  Name: Julie Gilbert MRN: [id number] Date of Birth: 03-Nov-1950 Referring Provider: Julianne Handler, FNP; Dr Bennett Scrape  Encounter Date: 07/20/2016      OT End of Session - 07/20/16 1200    Visit Number 1   Number of Visits 16   Authorization Type Pt is Authorized for Financial Assistance @ 100% up to 09/02/16 (Termination date).   Authorization Time Period Until 09/02/16   OT Start Time 1103   OT Stop Time 1145   OT Time Calculation (min) 42 min   Activity Tolerance Patient tolerated treatment well   Behavior During Therapy Flat affect      Past Medical History:  Diagnosis Date  . Asthma   . Diabetes mellitus without complication (HCC)   . Hypertension   . IBS (irritable bowel syndrome)     Past Surgical History:  Procedure Laterality Date  . CESAREAN SECTION    . EXPLORATORY LAPAROTOMY      There were no vitals filed for this visit.      Subjective Assessment - 07/20/16 1106    Subjective  Pt reports that "I had a stroke in the hospital and my balance on the left side is not strong" "I feel some weakness on that side"   Patient is accompained by: Family member   Patient Stated Goals Strengthening left side; Become independent, clean and cook again. "I'd like to be able to do things for myself"   Currently in Pain? Yes   Pain Score 3    Pain Location Head  C/O Migraine had meds before therapy appt    Pain Descriptors / Indicators Pounding   Pain Type Chronic pain   Pain Onset Today   Pain Frequency Intermittent   Pain Relieving Factors Rubbing her head, Medication   Multiple Pain Sites No           [facility] OT Assessment - 07/20/16 1110      Assessment   Diagnosis Transient Cerebral Ischemia   Referring Provider Julianne Handler, FNP; Dr Bennett Scrape    Onset Date 06/28/16     Precautions   Precautions Fall     Restrictions   Weight Bearing Restrictions No     Balance Screen   Has the patient fallen in the past 6 months Yes   How many times? 2-3   Has the patient had a decrease in activity level because of a fear of falling?  Yes   Is the patient reluctant to leave their home because of a fear of falling?  Yes     Home  Environment   Family/patient expects to be discharged to: Private residence   Lives With Daughter     Prior Function   Level of Independence Independent   Vocation On disability     ADL   Eating/Feeding Independent   Grooming Independent   Lower Body Bathing Supervision/safety   Upper Body Dressing Increased time   Lower Body Dressing Increased time   Toilet Tranfer Modified independent   Toileting - Clothing Manipulation Modified independent;Increased time   Toileting -  Hygiene Modified Independent;Increase time   Tub/Shower Transfer Supervision/safety   Equipment Used Other (comment)  Grab bar in shower, daughter assists PRN; Increased time   ADL comments Pt reports that she lives with her daughter whom assists her PRN for ADL's and  IADL's. She has been on disability x15 years but was more independent with meal prep and ADL's/Homemaking than she currently is, due to balance, h/o falls and left sided weakness      IADL   Shopping Needs to be accompanied on any shopping trip   Light Housekeeping Performs light daily tasks such as dishwashing, bed making   Meal Prep --  Does simple snack/meal prep but daughter does cooking   Prior Level of Function Financial Management Daughter manages finances     Mobility   Mobility Status History of falls   Mobility Status Comments Pt reports that she was advised to purchase a cane secondary to h/o falls and balance issues     Written Expression   Dominant Hand Right     Vision - History   Baseline Vision Wears glasses all the time   Visual History Other  (comment)  Pt reports h/o blurred vision when having a migraine     Vision Assessment   Comment No apparent visual deficits noted. Pt wears glasses; reports blurred vision when having a migraine - currently denies any vision changes     Activity Tolerance   Activity Tolerance Comments Apeears WFL's     Cognition   Overall Cognitive Status History of cognitive impairments - at baseline  Pt reports memory prob since she began having migraines >70yr   Memory Impaired   Memory Impairment Decreased recall of new information;Other (comment)  My memory isn't as clear    Behaviors Other (comment)  Flat affect     Sensation   Light Touch Appears Intact     Coordination   Gross Motor Movements are Fluid and Coordinated Yes   Fine Motor Movements are Fluid and Coordinated No   9 Hole Peg Test Right;Left   Right 9 Hole Peg Test 26.31 seconds   Left 9 Hole Peg Test 29.32 seconds   Coordination Pt reports that she sometimes drops items that she is holding in her left hand.     Perception   Perception Within Functional Limits     Edema   Edema None noted     ROM / Strength   AROM / PROM / Strength Strength     Strength   Overall Strength Deficits   Overall Strength Comments LUE grossly 3/5 and RUE 4+/5 via MMT    Strength Assessment Site --  L and RUE     Hand Function   Right Hand Gross Grasp Functional   Right Hand Grip (lbs) 46   Right Hand Lateral Pinch 8 lbs   Right Hand 3 Point Pinch 15 lbs   Left Hand Gross Grasp Functional   Left Hand Grip (lbs) 24   Left Hand Lateral Pinch 6 lbs   Left 3 point pinch 9 lbs   Comment Overall AROM LUE is WFL's but strength is impaired.     Sensation Exercises   Stereognosis Intact 4/4 objects (Key, screw, coin and button) Left hand                  OT Treatments/Exercises (OP) - 07/20/16 1110      Fine Motor Coordination   Other Fine Motor Exercises Issued HEP for LUE: flipping cards, dealing and shuffling cards. Picking  up coins, stacking coins, in hand manipulation of objects, tossing ball in 1 hand and between hands etc. (See pt instruction section for details).      Coordination Activities  Perform the following activities for 10 minutes 2-3 times  per day with left hand(s).   Rotate ball in fingertips (clockwise and counter-clockwise).  Toss ball between hands.  Toss ball in air and catch with the same hand.  Flip cards 1 at a time as fast as you can.  Deal cards with your thumb (Hold deck in hand and push card off top with thumb).  Shuffle cards.  Pick up coins and place in container or coin bank.  Pick up coins and stack.  Pick up coins one at a time until you get 5-10 in your hand, then move coins from palm to fingertips to stack one at a time. Screw together nuts and bolts, then unfasten.          OT Education - 07/20/16 1158    Education provided Yes   Education Details Role of OT; Findings from Assessment and recommendations; Home program for FM/Coordination    Person(s) Educated Patient   Methods Explanation;Demonstration;Handout   Comprehension Verbalized understanding;Need further instruction          OT Short Term Goals - 07/20/16 1217      OT SHORT TERM GOAL #1   Title Pt will be Mod I HEP for LUE strengthening   Time 3   Period Weeks   Status New     OT SHORT TERM GOAL #2   Title Pt will be Mod I FM/Coordination ex's LUE   Baseline VC's   Time 3   Period Weeks   Status New     OT SHORT TERM GOAL #3   Title Pt will I'ly state 2-3 memory strategies to assist with improving deficits   Baseline Pt reports h/o cognitive changes for >2 years ("Since I started having bad migraines")   Time 3   Period Weeks   Status New           OT Long Term Goals - 07/20/16 1219      OT LONG TERM GOAL #1   Title Pt will be Mod I upgraded HEP for LUE strength and coordination   Time 6   Period Weeks   Status New     OT LONG TERM GOAL #2   Title Pt will I'ly  state 2-3 energy conservation strategies that she can implement during ADL's   Time 6   Period Weeks   Status New     OT LONG TERM GOAL #3   Title Pt will be Mod I simple meal/snack prep in ADL kitchen w/ or w/o AD for increased safety/balance   Time 6   Period Weeks   Status New     OT LONG TERM GOAL #4   Title Pt will demonstrate increased LUE strength as seen by general MMT 4/5 LUE   Time 6   Period Weeks   Status New     OT LONG TERM GOAL #5   Title Pt will demonstrate increased coordination as seen by improved 9 Hole Peg Score LUE by 4-5 seconds or more   Baseline See Eval 07/20/16   Time 6   Period Weeks   Status New               Plan - 07/20/16 1203    Clinical Impression Statement Pt is a 65 y/o RHD female s/p TIA whom  presents to out-pt OT Neuro Rehab w/ dx: Transient Cerebral Ischemia for which she sought medical care at urgent care and was then referred to ER on 06/28/16. She was admitted on 06/28/16 and d/c'd on 06/29/16 by  Dr Bennett ScrapeAlex  Ukleja.  Her PCP is Massie MaroonLachina M Hollis, FNP. She states that she lives with her daughter whom takes care of finanaces, meal preparation and homeaking tasks prior to this hospital admission. "I have been on disability for 15 years, but I'd like to do more if I am able." She has significant PMH  (please see EPIC for full details) including migraines that cause blurred vision, type 2 diabetes, vertigo, depression, TIA, Chronic anemia, h/o cognitive/memory changes for >2 years per pt report etc. She reports that since her d/c, decreased balance and L sided weakness remains about the same. Overall deficits include decreased balance, generalized weakness/strength impacting ADL's and IADL's. She should benefit from out-pt OT for pt/family education, strengthening, coordination LUE and  ADL's.    Rehab Potential Fair   OT Frequency 2x / week   OT Duration 6 weeks   OT Treatment/Interventions Self-care/ADL training;Patient/family  education;Therapeutic exercises;Therapeutic exercise;Therapeutic activities;Cognitive remediation/compensation;Functional Mobility Training;Neuromuscular education;Energy conservation;DME and/or AE instruction;Balance training   Plan Review/Perform FM/coordinaiton ex's LUE; memory strategies   Consulted and Agree with Plan of Care Patient  Daughter in waiting room, not present during assessment      Patient will benefit from skilled therapeutic intervention in order to improve the following deficits and impairments:  Decreased coordination, Decreased activity tolerance, Pain, Impaired UE functional use, Decreased balance, Decreased knowledge of use of DME, Decreased cognition, Decreased strength, Abnormal gait, Decreased endurance  Visit Diagnosis: Muscle weakness (generalized) - Plan: Ot plan of care cert/re-cert  Other lack of coordination - Plan: Ot plan of care cert/re-cert  Other abnormalities of gait and mobility - Plan: Ot plan of care cert/re-cert  Other symptoms and signs involving cognitive functions following unspecified cerebrovascular disease - Plan: Ot plan of care cert/re-cert      G-Codes - 07/20/16 1226    Functional Assessment Tool Used Clinical judgement; 9 Hole Peg test   Functional Limitation Carrying, moving and handling objects   Carrying, Moving and Handling Objects Current Status [phone](G8984) At least 1 percent but less than 20 percent impaired, limited or restricted   Carrying, Moving and Handling Objects Goal Status ([id number]) At least 1 percent but less than 20 percent impaired, limited or restricted      Problem List Patient Active Problem List   Diagnosis Date Noted  . Chronic anemia 06/29/2016  . TIA (transient ischemic attack) 06/29/2016  . Transient ischemic attack (TIA) 06/28/2016  . Arthralgia 08/12/2015  . Gastroesophageal reflux disease without esophagitis 06/18/2015  . History of migraine headaches 05/13/2015  . Essential hypertension 05/13/2015  .  Diabetes mellitus type 2, controlled (HCC) 05/13/2015  . IBS (irritable bowel syndrome) 05/13/2015  . Migraine headache 05/13/2015  . Depression 05/13/2015  . Insomnia 05/13/2015    Alm BustardBarnhill, Amy Beth Dixon, OTR/L 07/20/2016, 12:35 PM  [facility] [address] [place], [place], [address] Phone: [phone]   Fax:  [phone]  Name: Raiford Noblennie G Knock MRN: [id number] Date of Birth: 1951-09-05

## 2016-07-25 ENCOUNTER — Ambulatory Visit: Payer: No Typology Code available for payment source | Admitting: Physical Therapy

## 2016-07-25 DIAGNOSIS — R2689 Other abnormalities of gait and mobility: Secondary | ICD-10-CM

## 2016-07-25 DIAGNOSIS — R278 Other lack of coordination: Secondary | ICD-10-CM

## 2016-07-25 DIAGNOSIS — R2681 Unsteadiness on feet: Secondary | ICD-10-CM

## 2016-07-25 DIAGNOSIS — M6281 Muscle weakness (generalized): Secondary | ICD-10-CM

## 2016-07-25 NOTE — Therapy (Signed)
[facility] [address] [place], [place], [address] Phone: [phone]   Fax:  [phone]  Physical Therapy Treatment  Patient Details  Name: Julie Gilbert MRN: [id number] Date of Birth: 1951/07/31 Referring Provider: PCP - Julianne HandlerLachina Hollis, FNP (Hospitalist - Unice BaileyAlexander Ukleja, MD)  Encounter Date: 07/25/2016    Past Medical History:  Diagnosis Date  . Asthma   . Diabetes mellitus without complication (HCC)   . Hypertension   . IBS (irritable bowel syndrome)     Past Surgical History:  Procedure Laterality Date  . CESAREAN SECTION    . EXPLORATORY LAPAROTOMY      There were no vitals filed for this visit.      Subjective Assessment - 07/25/16 0934    Subjective Pt has been working on her walking in her neighborhood about 1.5 mi.  LLE gets fatigued but not as much. Pt has appointment with new PCP in November. Pt doesn't want a SPC and hasn't gotten one yet.    Currently in Pain? Yes   Pain Score 5    Pain Location Hand   Pain Orientation Proximal  front of head   Pain Type Chronic pain   Pain Onset Today   Pain Frequency Constant            [facility] PT Assessment - 07/25/16 0001      Functional Gait  Assessment   Gait assessed  Yes   Gait Level Surface Walks 20 ft in less than 5.5 sec, no assistive devices, good speed, no evidence for imbalance, normal gait pattern, deviates no more than 6 in outside of the 12 in walkway width.   Change in Gait Speed Able to smoothly change walking speed without loss of balance or gait deviation. Deviate no more than 6 in outside of the 12 in walkway width.   Gait with Horizontal Head Turns Performs head turns smoothly with no change in gait. Deviates no more than 6 in outside 12 in walkway width   Gait with Vertical Head Turns Performs head turns with no change in gait. Deviates no more than 6 in outside 12 in walkway width.   Gait and Pivot Turn Pivot turns safely  in greater than 3 sec and stops with no loss of balance, or pivot turns safely within 3 sec and stops with mild imbalance, requires small steps to catch balance.   Step Over Obstacle Is able to step over one shoe box (4.5 in total height) without changing gait speed. No evidence of imbalance.   Gait with Narrow Base of Support Ambulates 7-9 steps.   Gait with Eyes Closed Walks 20 ft, slow speed, abnormal gait pattern, evidence for imbalance, deviates 10-15 in outside 12 in walkway width. Requires more than 9 sec to ambulate 20 ft.   Ambulating Backwards Walks 20 ft, no assistive devices, good speed, no evidence for imbalance, normal gait   Steps Alternating feet, must use rail.   Total Score 24                     [facility] Adult PT Treatment/Exercise - 07/25/16 0001      Knee/Hip Exercises: Aerobic   Other Aerobic SCI fit LEVEL 2.5, 5min +353min with rest break x1.                PT Education - 07/25/16 1008    Education provided Yes   Education Details HEP for balance, discussed benefits of having BP machine  at home, a [facility] available on "bad" days with migraines and vertigo bouts, and machines pt can use at fitness center.   Person(s) Educated Patient   Methods Explanation;Demonstration;Verbal cues;Handout   Comprehension Verbalized understanding;Returned demonstration;Verbal cues required;Need further instruction          PT Short Term Goals - 07/20/16 1521      PT SHORT TERM GOAL #1   Title Pt will be independent and verbalize understanding of initial HEP to continue progress in therapy. (Target Date: 08/12/16)   Time 3   Period Weeks   Status New     PT SHORT TERM GOAL #2   Title Pt will improve Berg Balance score to > or = 40/56 to indicate improved static balance. (Target Date: 08/12/16)   Time 3   Period Weeks   Status New     PT SHORT TERM GOAL #3   Title Pt will improve FGA score by 4 points from baseline to indicate improved functional mobility.  (Target Date: 08/12/16)   Time 3   Period Weeks   Status New     PT SHORT TERM GOAL #4   Title Pt will ambulate 500' with LRAD and suprevision indoors over level surfaces, ramps, and curbs with no LOB to incr safety when ambulating at home. (Target Date: 08/12/16)   Time 3   Period Weeks   Status New           PT Long Term Goals - 07/20/16 1525      PT LONG TERM GOAL #1   Title Pt will be independent and verbalize understanding of HEP and on-going fitnes plan to maintain progress made in therapy and improve overall health. (Target Date: 09/02/16)   Time 6   Period Weeks   Status New     PT LONG TERM GOAL #2   Title Pt will improve Berg Balance score to > or = 45/56 to indicate decr risk of falls. (Target Date: 09/02/16)   Time 6   Period Weeks   Status New     PT LONG TERM GOAL #3   Title Pt will improve FGA score by 8 points from baseline to indicate decr risk of falls and improved functional mobility. (Target Date: 09/02/16)   Time 6   Period Weeks   Status New     PT LONG TERM GOAL #4   Title Pt will ambulate 1000' with LRAD and mod I outdoors over unlevel surfaces, pavement, grass, gravel, ramps, and curbs to incr safety when ambulating in the community. (Target Date: 09/02/16)   Time 6   Period Weeks   Status New               Plan - 07/25/16 1013    Clinical Impression Statement Initiated HEP for balance; pt required intermittent UE support for standing balance with narrow BOS.  Pt scored 24/30 on FGA.  Pt reported getting fatigued on Sci fit stepper requiring x1 restbreak in 8 min.   Rehab Potential Good   Clinical Impairments Affecting Rehab Potential HTN, DM2, asthma, chronic anemia   PT Frequency 2x / week   PT Duration 6 weeks   PT Treatment/Interventions ADLs/Self Care Home Management;Canalith Repostioning;Functional mobility training;Stair training;Gait training;DME Instruction;Therapeutic activities;Therapeutic exercise;Balance  training;Neuromuscular re-education;Patient/family education;Manual techniques;Energy conservation;Vestibular   PT Next Visit Plan Review HEP for strength & balance, Perform FGA, functional balance and gait activities, strengthen L LE   Consulted and Agree with Plan of Care Patient  Patient will benefit from skilled therapeutic intervention in order to improve the following deficits and impairments:  Abnormal gait, Decreased activity tolerance, Decreased balance, Decreased knowledge of use of DME, Decreased endurance, Decreased coordination, Decreased safety awareness, Decreased strength, Dizziness, Impaired perceived functional ability, Improper body mechanics, Impaired UE functional use, Pain  Visit Diagnosis: Muscle weakness (generalized)  Other lack of coordination  Unsteadiness on feet  Other abnormalities of gait and mobility     Problem List Patient Active Problem List   Diagnosis Date Noted  . Chronic anemia 06/29/2016  . TIA (transient ischemic attack) 06/29/2016  . Transient ischemic attack (TIA) 06/28/2016  . Arthralgia 08/12/2015  . Gastroesophageal reflux disease without esophagitis 06/18/2015  . History of migraine headaches 05/13/2015  . Essential hypertension 05/13/2015  . Diabetes mellitus type 2, controlled (HCC) 05/13/2015  . IBS (irritable bowel syndrome) 05/13/2015  . Migraine headache 05/13/2015  . Depression 05/13/2015  . Insomnia 05/13/2015    Hortencia ConradiKarissa Edis Huish, PTA  07/25/16, 10:26 AM [facility] [address] [place], [place], [address] Phone: [phone]   Fax:  [phone]  Name: Julie Gilbert MRN: [id number] Date of Birth: 1951-03-23

## 2016-07-25 NOTE — Patient Instructions (Addendum)
SINGLE LIMB STANCE    Stance: single leg on floor. Raise leg. Hold __15_ seconds. Repeat with other leg. _3__ reps per set, most___ days per week  Copyright  VHI. All rights reserved.  Marching In-Place    Standing straight, alternate bringing knees UP toward trunk MARCH FORWARD. Arms swing alternately. Do _4__ sets. Do _1-2__ times per day.   Tandem Stance    Right foot in front of left, heel touching toe both feet "straight ahead". Stand on Foot Triangle of Support with both feet. Balance in this position ___ seconds. Do with left foot in front of right.  Copyright  VHI. All rights reserved.    Copyright  VHI. All rights reserved.  Feet Heel-Toe "Tandem"    Arms outstretched, walk a straight line bringing one foot directly in front of the other. Repeat for __x4 along the counter_ Do __1-2__ sessions per day.  Copyright  VHI. All rights reserved.   

## 2016-07-27 ENCOUNTER — Ambulatory Visit: Payer: No Typology Code available for payment source | Attending: Family Medicine | Admitting: Physical Therapy

## 2016-07-27 VITALS — BP 165/108

## 2016-07-27 DIAGNOSIS — R2681 Unsteadiness on feet: Secondary | ICD-10-CM | POA: Insufficient documentation

## 2016-07-27 DIAGNOSIS — R2689 Other abnormalities of gait and mobility: Secondary | ICD-10-CM | POA: Insufficient documentation

## 2016-07-27 DIAGNOSIS — R278 Other lack of coordination: Secondary | ICD-10-CM | POA: Insufficient documentation

## 2016-07-27 DIAGNOSIS — M6281 Muscle weakness (generalized): Secondary | ICD-10-CM | POA: Insufficient documentation

## 2016-07-27 NOTE — Therapy (Signed)
[facility] [address] [place], [place], [address] Phone: [phone]   Fax:  [phone]  Physical Therapy Treatment  Patient Details  Name: Julie Gilbert MRN: [id number] Date of Birth: January 07, 1951 Referring Provider: PCP - Julianne HandlerLachina Hollis, FNP (Hospitalist - Unice BaileyAlexander Ukleja, MD)  Encounter Date: 07/27/2016      PT End of Session - 07/27/16 1043    Visit Number 3   Number of Visits 13   Date for PT Re-Evaluation 09/02/16   PT Start Time 1020   PT Stop Time 1040   PT Time Calculation (min) 20 min   Activity Tolerance Patient tolerated treatment well   Behavior During Therapy Flat affect;WFL for tasks assessed/performed      Past Medical History:  Diagnosis Date  . Asthma   . Diabetes mellitus without complication (HCC)   . Hypertension   . IBS (irritable bowel syndrome)     Past Surgical History:  Procedure Laterality Date  . CESAREAN SECTION    . EXPLORATORY LAPAROTOMY      Vitals:   07/27/16 1046 07/27/16 1055  BP: (!) 143/93 beginning of session (!) 165/108  5 min after balance exercise        Subjective Assessment - 07/27/16 1046    Subjective Pt has a migraine and severe neuropathy pain today.  Reports feeling  very unstable today.  Goes to see PCP next week and plan to get a prescription for a BP machine.  Has not gotten a cane yet.   Currently in Pain? Yes   Pain Score 8    Pain Location Foot   Pain Orientation Right;Left   Pain Descriptors / Indicators Tightness   Pain Type Chronic pain   Pain Radiating Towards up to knees   Pain Onset Today   Pain Frequency Constant   Multiple Pain Sites Yes   Pain Score 9   Pain Location Head   Pain Orientation Proximal   Pain Descriptors / Indicators Headache   Pain Type Acute pain   Pain Onset Today   Pain Frequency Constant   Aggravating Factors  Migraine                       NEUROMUSCULAR RE-EDUCATION: Performed SLS and  marching forward, multiple reps with each with 1 UESsupport.  Min cues for technique           PT Education - 07/27/16 1051    Education provided Yes   Education Details Signs and symptoms of a TIA/CVA and when to call 911; Importance on obtaining a SPC for when balance in poor ( days with migraines and when neuropathy pain is worse); HEP review.   Person(s) Educated Patient   Methods Explanation   Comprehension Verbalized understanding          PT Short Term Goals - 07/20/16 1521      PT SHORT TERM GOAL #1   Title Pt will be independent and verbalize understanding of initial HEP to continue progress in therapy. (Target Date: 08/12/16)   Time 3   Period Weeks   Status New     PT SHORT TERM GOAL #2   Title Pt will improve Berg Balance score to > or = 40/56 to indicate improved static balance. (Target Date: 08/12/16)   Time 3   Period Weeks   Status New     PT SHORT TERM GOAL #3   Title Pt will improve FGA score by 4 points  from baseline to indicate improved functional mobility. (Target Date: 08/12/16)   Time 3   Period Weeks   Status New     PT SHORT TERM GOAL #4   Title Pt will ambulate 500' with LRAD and suprevision indoors over level surfaces, ramps, and curbs with no LOB to incr safety when ambulating at home. (Target Date: 08/12/16)   Time 3   Period Weeks   Status New           PT Long Term Goals - 07/20/16 1525      PT LONG TERM GOAL #1   Title Pt will be independent and verbalize understanding of HEP and on-going fitnes plan to maintain progress made in therapy and improve overall health. (Target Date: 09/02/16)   Time 6   Period Weeks   Status New     PT LONG TERM GOAL #2   Title Pt will improve Berg Balance score to > or = 45/56 to indicate decr risk of falls. (Target Date: 09/02/16)   Time 6   Period Weeks   Status New     PT LONG TERM GOAL #3   Title Pt will improve FGA score by 8 points from baseline to indicate decr risk of falls and  improved functional mobility. (Target Date: 09/02/16)   Time 6   Period Weeks   Status New     PT LONG TERM GOAL #4   Title Pt will ambulate 1000' with LRAD and mod I outdoors over unlevel surfaces, pavement, grass, gravel, ramps, and curbs to incr safety when ambulating in the community. (Target Date: 09/02/16)   Time 6   Period Weeks   Status New               Plan - 07/27/16 1056    Clinical Impression Statement Checked BP due to pt having a migraine. Reported high BP to primary PT.  Performed standing balance exercises for 5 min and checked BP again.  BP had increased; held rest of session so pt could go home and rest per primary PT's recommendation.   Rehab Potential Good   Clinical Impairments Affecting Rehab Potential HTN, DM2, asthma, chronic anemia   PT Frequency 2x / week   PT Duration 6 weeks   PT Treatment/Interventions ADLs/Self Care Home Management;Canalith Repostioning;Functional mobility training;Stair training;Gait training;DME Instruction;Therapeutic activities;Therapeutic exercise;Balance training;Neuromuscular re-education;Patient/family education;Manual techniques;Energy conservation;Vestibular   PT Next Visit Plan Check BP.    Review HEP for strength & balance, functional balance and gait activities, strengthen L LE   Consulted and Agree with Plan of Care Patient      Patient will benefit from skilled therapeutic intervention in order to improve the following deficits and impairments:  Abnormal gait, Decreased activity tolerance, Decreased balance, Decreased knowledge of use of DME, Decreased endurance, Decreased coordination, Decreased safety awareness, Decreased strength, Dizziness, Impaired perceived functional ability, Improper body mechanics, Impaired UE functional use, Pain  Visit Diagnosis: Muscle weakness (generalized)  Other lack of coordination  Unsteadiness on feet  Other abnormalities of gait and mobility     Problem List Patient Active  Problem List   Diagnosis Date Noted  . Chronic anemia 06/29/2016  . TIA (transient ischemic attack) 06/29/2016  . Transient ischemic attack (TIA) 06/28/2016  . Arthralgia 08/12/2015  . Gastroesophageal reflux disease without esophagitis 06/18/2015  . History of migraine headaches 05/13/2015  . Essential hypertension 05/13/2015  . Diabetes mellitus type 2, controlled (HCC) 05/13/2015  . IBS (irritable bowel syndrome) 05/13/2015  . Migraine  headache 05/13/2015  . Depression 05/13/2015  . Insomnia 05/13/2015    Hortencia Conradi, PTA  07/27/16, 12:45 PM [facility] [address] [place], [place], [address] Phone: [phone]   Fax:  [phone]  Name: EOWYN TABONE MRN: [id number] Date of Birth: 1951-03-09

## 2016-07-27 NOTE — Patient Instructions (Signed)
SINGLE LIMB STANCE    Stance: single leg on floor. Raise leg. Hold __15_ seconds. Repeat with other leg. _3__ reps per set, most___ days per week  Copyright  VHI. All rights reserved.  Marching In-Place    Standing straight, alternate bringing knees UP toward trunk [facility]. Arms swing alternately. Do _4__ sets. Do _1-2__ times per day.   Tandem Stance    Right foot in front of left, heel touching toe both feet "straight ahead". Stand on Foot Triangle of Support with both feet. Balance in this position ___ seconds. Do with left foot in front of right.  Copyright  VHI. All rights reserved.    Copyright  VHI. All rights reserved.  Feet Heel-Toe "Tandem"    Arms outstretched, walk a straight line bringing one foot directly in front of the other. Repeat for __x4 along the counter_ Do __1-2__ sessions per day.  Copyright  VHI. All rights reserved.

## 2016-07-28 ENCOUNTER — Ambulatory Visit: Payer: No Typology Code available for payment source | Admitting: *Deleted

## 2016-08-01 ENCOUNTER — Ambulatory Visit: Payer: No Typology Code available for payment source | Admitting: Physical Therapy

## 2016-08-01 ENCOUNTER — Ambulatory Visit: Payer: No Typology Code available for payment source | Admitting: Occupational Therapy

## 2016-08-01 VITALS — BP 160/100

## 2016-08-01 DIAGNOSIS — R2681 Unsteadiness on feet: Secondary | ICD-10-CM

## 2016-08-01 DIAGNOSIS — M6281 Muscle weakness (generalized): Secondary | ICD-10-CM

## 2016-08-01 DIAGNOSIS — R278 Other lack of coordination: Secondary | ICD-10-CM

## 2016-08-01 DIAGNOSIS — R2689 Other abnormalities of gait and mobility: Secondary | ICD-10-CM

## 2016-08-01 NOTE — Therapy (Signed)
[facility] [address] [place], [place], [address] Phone: [phone]   Fax:  [phone]  Patient Details  Name: Julie Gilbert MRN: [id number] Date of Birth: 03-Oct-1950 Referring Provider:  Massie MaroonHollis, Lachina M, FNP  Encounter Date: 08/01/2016  Pt presented to PT (PT scheduled first) with high BP; and therefore, was not seen/OT visit cancelled.--See PT note for details.   [facility] Center[facility]REEMAN,Raoul Ciano 08/01/2016, 11:08 AM  [facility] [address] [place], [place], [address] Phone: [phone]   Fax:  [phone]   Willa FraterAngela Koltin Wehmeyer, OTR/L [facility] [address] [place], [place]  [address] [phone] phone [phone] 08/01/16 11:09 AM

## 2016-08-01 NOTE — Therapy (Signed)
[facility] [address] [place], [place], [address] Phone: [phone]   Fax:  [phone]  Physical Therapy Treatment  Patient Details  Name: Julie Gilbert MRN: [id number] Date of Birth: Jun 03, 1951 Referring Provider: PCP - Julianne HandlerLachina Hollis, FNP (Hospitalist - Unice BaileyAlexander Ukleja, MD)  Encounter Date: 08/01/2016      PT End of Session - 08/01/16 1031    Visit Number 3  Cancelled session today   Number of Visits 13   Date for PT Re-Evaluation 09/02/16   PT Start Time 1018  no charge session cancelled   PT Stop Time 1028   PT Time Calculation (min) 10 min   Activity Tolerance Patient tolerated treatment well   Behavior During Therapy Flat affect;WFL for tasks assessed/performed      Past Medical History:  Diagnosis Date  . Asthma   . Diabetes mellitus without complication (HCC)   . Hypertension   . IBS (irritable bowel syndrome)     Past Surgical History:  Procedure Laterality Date  . CESAREAN SECTION    . EXPLORATORY LAPAROTOMY      Vitals:   08/01/16 1021 08/01/16 1028  BP: (!) 159/112 (!) 160/100        Subjective Assessment - 08/01/16 1029    Subjective pt reports she has an appointment with her primary care on Thursday to address high blood pressure. She believes her new migraine meds may be the cause of her high BP, because "it was never high before".   Pertinent History HTN, asthma, chronic amemia, migraine HA, DM2, IBS   Limitations Walking;House hold activities;Lifting   Patient Stated Goals "I'd like to be able to get back to walking 2 miles a day."   Currently in Pain? No/denies   Pain Score 0-No pain           PT Short Term Goals - 07/20/16 1521      PT SHORT TERM GOAL #1   Title Pt will be independent and verbalize understanding of initial HEP to continue progress in therapy. (Target Date: 08/12/16)   Time 3   Period Weeks   Status New     PT SHORT TERM GOAL #2   Title Pt  will improve Berg Balance score to > or = 40/56 to indicate improved static balance. (Target Date: 08/12/16)   Time 3   Period Weeks   Status New     PT SHORT TERM GOAL #3   Title Pt will improve FGA score by 4 points from baseline to indicate improved functional mobility. (Target Date: 08/12/16)   Time 3   Period Weeks   Status New     PT SHORT TERM GOAL #4   Title Pt will ambulate 500' with LRAD and suprevision indoors over level surfaces, ramps, and curbs with no LOB to incr safety when ambulating at home. (Target Date: 08/12/16)   Time 3   Period Weeks   Status New           PT Long Term Goals - 07/20/16 1525      PT LONG TERM GOAL #1   Title Pt will be independent and verbalize understanding of HEP and on-going fitnes plan to maintain progress made in therapy and improve overall health. (Target Date: 09/02/16)   Time 6   Period Weeks   Status New     PT LONG TERM GOAL #2   Title Pt will improve Berg Balance score to > or = 45/56 to indicate decr risk  of falls. (Target Date: 09/02/16)   Time 6   Period Weeks   Status New     PT LONG TERM GOAL #3   Title Pt will improve FGA score by 8 points from baseline to indicate decr risk of falls and improved functional mobility. (Target Date: 09/02/16)   Time 6   Period Weeks   Status New     PT LONG TERM GOAL #4   Title Pt will ambulate 1000' with LRAD and mod I outdoors over unlevel surfaces, pavement, grass, gravel, ramps, and curbs to incr safety when ambulating in the community. (Target Date: 09/02/16)   Time 6   Period Weeks   Status New           Plan - 08/01/16 1032    Clinical Impression Statement Checked BP due to high reading last session. Blood pressure is still elevated. Discussed with pt the importance of seeing PCP and advised calling to see if she could be seen today. Cancelled session due to contraindications for exercise.   Rehab Potential Good   Clinical Impairments Affecting Rehab Potential HTN,  DM2, asthma, chronic anemia   PT Frequency 2x / week   PT Duration 6 weeks   PT Treatment/Interventions ADLs/Self Care Home Management;Canalith Repostioning;Functional mobility training;Stair training;Gait training;DME Instruction;Therapeutic activities;Therapeutic exercise;Balance training;Neuromuscular re-education;Patient/family education;Manual techniques;Energy conservation;Vestibular   PT Next Visit Plan Check BP.    Review HEP for strength & balance, functional balance and gait activities, strengthen L LE   Consulted and Agree with Plan of Care Patient      Patient will benefit from skilled therapeutic intervention in order to improve the following deficits and impairments:  Abnormal gait, Decreased activity tolerance, Decreased balance, Decreased knowledge of use of DME, Decreased endurance, Decreased coordination, Decreased safety awareness, Decreased strength, Dizziness, Impaired perceived functional ability, Improper body mechanics, Impaired UE functional use, Pain  Visit Diagnosis: Muscle weakness (generalized)  Other lack of coordination  Unsteadiness on feet  Other abnormalities of gait and mobility     Problem List Patient Active Problem List   Diagnosis Date Noted  . Chronic anemia 06/29/2016  . TIA (transient ischemic attack) 06/29/2016  . Transient ischemic attack (TIA) 06/28/2016  . Arthralgia 08/12/2015  . Gastroesophageal reflux disease without esophagitis 06/18/2015  . History of migraine headaches 05/13/2015  . Essential hypertension 05/13/2015  . Diabetes mellitus type 2, controlled (HCC) 05/13/2015  . IBS (irritable bowel syndrome) 05/13/2015  . Migraine headache 05/13/2015  . Depression 05/13/2015  . Insomnia 05/13/2015    Kallie LocksHannah Aishia Barkey, SPTA 08/01/2016, 10:51 AM  [facility] [address] [place], [place], [address] Phone: [phone]   Fax:  [phone]  Name: Julie Gilbert MRN:  [id number] Date of Birth: 21-Jun-1951

## 2016-08-02 ENCOUNTER — Ambulatory Visit: Payer: No Typology Code available for payment source | Admitting: Occupational Therapy

## 2016-08-02 ENCOUNTER — Ambulatory Visit: Payer: No Typology Code available for payment source | Admitting: Physical Therapy

## 2016-08-04 ENCOUNTER — Encounter: Payer: Self-pay | Admitting: Internal Medicine

## 2016-08-04 ENCOUNTER — Ambulatory Visit: Payer: Medicaid Other | Attending: Internal Medicine | Admitting: Internal Medicine

## 2016-08-04 DIAGNOSIS — J45909 Unspecified asthma, uncomplicated: Secondary | ICD-10-CM | POA: Insufficient documentation

## 2016-08-04 DIAGNOSIS — K589 Irritable bowel syndrome without diarrhea: Secondary | ICD-10-CM | POA: Insufficient documentation

## 2016-08-04 DIAGNOSIS — G43909 Migraine, unspecified, not intractable, without status migrainosus: Secondary | ICD-10-CM | POA: Diagnosis not present

## 2016-08-04 DIAGNOSIS — E119 Type 2 diabetes mellitus without complications: Secondary | ICD-10-CM | POA: Diagnosis not present

## 2016-08-04 DIAGNOSIS — Z79899 Other long term (current) drug therapy: Secondary | ICD-10-CM | POA: Diagnosis not present

## 2016-08-04 DIAGNOSIS — Z7982 Long term (current) use of aspirin: Secondary | ICD-10-CM | POA: Insufficient documentation

## 2016-08-04 DIAGNOSIS — E118 Type 2 diabetes mellitus with unspecified complications: Secondary | ICD-10-CM

## 2016-08-04 DIAGNOSIS — Z8673 Personal history of transient ischemic attack (TIA), and cerebral infarction without residual deficits: Secondary | ICD-10-CM | POA: Insufficient documentation

## 2016-08-04 DIAGNOSIS — F411 Generalized anxiety disorder: Secondary | ICD-10-CM | POA: Diagnosis not present

## 2016-08-04 DIAGNOSIS — R112 Nausea with vomiting, unspecified: Secondary | ICD-10-CM | POA: Diagnosis not present

## 2016-08-04 DIAGNOSIS — I10 Essential (primary) hypertension: Secondary | ICD-10-CM | POA: Diagnosis not present

## 2016-08-04 DIAGNOSIS — Z888 Allergy status to other drugs, medicaments and biological substances status: Secondary | ICD-10-CM | POA: Insufficient documentation

## 2016-08-04 DIAGNOSIS — Z91013 Allergy to seafood: Secondary | ICD-10-CM | POA: Diagnosis not present

## 2016-08-04 DIAGNOSIS — G43009 Migraine without aura, not intractable, without status migrainosus: Secondary | ICD-10-CM

## 2016-08-04 LAB — GLUCOSE, POCT (MANUAL RESULT ENTRY): POC GLUCOSE: 78 mg/dL (ref 70–99)

## 2016-08-04 MED ORDER — ONDANSETRON 4 MG PO TBDP: 4.0000 mg | ORAL_TABLET | Freq: Three times a day (TID) | ORAL | 0 refills | Status: DC | PRN

## 2016-08-04 MED ORDER — BUTALBITAL-APAP-CAFFEINE 50-325-40 MG PO TABS: 1.0000 | ORAL_TABLET | Freq: Four times a day (QID) | ORAL | 0 refills | Status: DC | PRN

## 2016-08-04 MED ORDER — SUMATRIPTAN SUCCINATE 25 MG PO TABS: 50.0000 mg | ORAL_TABLET | Freq: Once | ORAL | 0 refills | Status: DC

## 2016-08-04 NOTE — Progress Notes (Signed)
Julie Gilbert, is a 65 y.o. female  ZOX:[id number]  [id number]  DOB - [birth year]  Chief Complaint  Patient presents with  . Emesis      Subjective:   Julie Dockernnie Monacelli is a 65 y.o. female with history of hypertension, type 2 diabetes mellitus, IBS and Asthma here today for a follow up visit. Patient was hosptialized 06/28/2016-06/29/2016 for Transient ischemic attack (TIA) and she is currently undergoing PT/OT. Patient complained of experiencing intense but occasional nausea and vomiting. She has been diagnosed with gastritis in the past. She also continues to have headache, she claims Sumatriptan works well for this headache, she is yet to see the Neurologist but has been referred. Patient desires to have a prescription for cane/walker to assist with ambulation. She denies any fall. Pt has been working on her walking in her neighborhood about 1.5 mi. She said her LLE gets fatigued but not as much.  Patient has No headache, No chest pain, No abdominal pain - No Nausea, No new weakness tingling or numbness, No Cough - SOB.  No problems updated.  ALLERGIES: Allergies  Allergen Reactions  . Peanuts [Peanut Oil] Anaphylaxis  . Shrimp [Shellfish Allergy] Anaphylaxis  . Latex Hives    PAST MEDICAL HISTORY: Past Medical History:  Diagnosis Date  . Asthma   . Diabetes mellitus without complication (HCC)   . Hypertension   . IBS (irritable bowel syndrome)     MEDICATIONS AT HOME: Prior to Admission medications   Medication Sig Start Date End Date Taking? Authorizing Provider  aspirin 81 MG tablet Take 81 mg by mouth daily.   Yes Historical Provider, MD  atorvastatin (LIPITOR) 40 MG tablet Take 1 tablet (40 mg total) by mouth daily at 6 PM. 07/07/16  Yes Quentin Angstlugbemiga E Eunice Winecoff, MD  Fish Oil-Cholecalciferol (FISH OIL + D3) 1000-1000 MG-UNIT CAPS Take 1 tablet by mouth daily.   Yes Historical Provider, MD  losartan (COZAAR) 25 MG tablet Take 1 tablet (25 mg total) by mouth 2 (two) times  daily. 07/12/16  Yes Henrietta HooverLinda C Bernhardt, NP  meclizine (ANTIVERT) 25 MG tablet Take 1 tablet (25 mg total) by mouth every 8 (eight) hours as needed for dizziness. 07/07/16  Yes Quentin Angstlugbemiga E Janira Mandell, MD  metFORMIN (GLUCOPHAGE) 500 MG tablet Take 1 tablet (500 mg total) by mouth 2 (two) times daily with a meal. 07/07/16  Yes Lamontae Ricardo E Hyman HopesJegede, MD  omeprazole (PRILOSEC) 20 MG capsule Take 1 capsule (20 mg total) by mouth daily. 07/12/16  Yes Henrietta HooverLinda C Bernhardt, NP  SUMAtriptan (IMITREX) 25 MG tablet TAKE 2 TABLETS BY MOUTH ONCE. MAXIMUM DAILY DOSE 200 MG (8 TABLETS) 07/14/16  Yes Henrietta HooverLinda C Bernhardt, NP  butalbital-acetaminophen-caffeine (FIORICET) 50-325-40 MG tablet Take 1 tablet by mouth every 6 (six) hours as needed for headache. 08/04/16 08/04/17  Quentin Angstlugbemiga E Lehi Phifer, MD  fluticasone (FLONASE) 50 MCG/ACT nasal spray Place 2 sprays into both nostrils daily. Patient not taking: Reported on 08/04/2016 02/03/16   Massie MaroonLachina M Hollis, FNP  linaclotide [facility](LINZESS) 145 MCG CAPS capsule Take 1 capsule (145 mcg total) by mouth daily as needed (FOR IBS). 07/07/16   Quentin Angstlugbemiga E Jahniah Pallas, MD  nortriptyline (PAMELOR) 25 MG capsule Take 1 capsule (25 mg total) by mouth at bedtime. 07/07/16   Quentin Angstlugbemiga E Gisella Alwine, MD  ondansetron (ZOFRAN ODT) 4 MG disintegrating tablet Take 1 tablet (4 mg total) by mouth every 8 (eight) hours as needed for nausea or vomiting. 08/04/16   Quentin Angstlugbemiga E Vernee Baines, MD  SUMAtriptan (IMITREX) 25 MG tablet  Take 2 tablets (50 mg total) by mouth once. Maximum daily dose 200mg  08/04/16 08/04/16  Quentin Angstlugbemiga E Solomia Lacey, MD    Objective:   Vitals:   08/04/16 1408  BP: (!) 138/97  Pulse: (!) 105  Resp: 20  Temp: 100.2 F (37.9 C)  TempSrc: Oral  SpO2: 97%  Weight: 191 lb (86.6 kg)  Height: 5\' 7"  (1.702 m)   Exam General appearance : Awake, alert, not in any distress. Speech Clear. Not toxic looking, obese HEENT: Atraumatic and Normocephalic, pupils equally reactive to light and accomodation Neck:  Supple, no JVD. No cervical lymphadenopathy.  Chest: Good air entry bilaterally, no added sounds  CVS: S1 S2 regular, no murmurs.  Abdomen: Bowel sounds present, Non tender and not distended with no gaurding, rigidity or rebound. Extremities: B/L Lower Ext shows no edema, both legs are warm to touch Neurology: Awake alert, and oriented X 3, CN II-XII intact, Non focal Skin: No Rash  Data Review Lab Results  Component Value Date   HGBA1C 6.4 (H) 06/29/2016   HGBA1C 6.3 (H) 12/14/2015   HGBA1C 7.2 (H) 08/12/2015    Assessment & Plan   1. Controlled type 2 diabetes mellitus with complication, without long-term current use of insulin (HCC)  - Microalbumin/Creatinine Ratio, Urine - Glucose (CBG)  2. Essential hypertension We have discussed target BP range and blood pressure goal. I have advised patient to check BP regularly and to call us back or report to clinic if the numbers are consistently higher than 140/90. We discussed the importance of compliance with medical therapy and DASH diet recommended, consequences of uncontrolled hypertension discussed.  - continue current BP medications  3. Nonintractable migraine, unspecified migraine type  - SUMAtriptan (IMITREX) 25 MG tablet; Take 2 tablets (50 mg total) by mouth once. Maximum daily dose 200mg   Dispense: 10 tablet; Refill: 0 - butalbital-acetaminophen-caffeine (FIORICET) 50-325-40 MG tablet; Take 1 tablet by mouth every 6 (six) hours as needed for headache.  Dispense: 30 tablet; Refill: 0  4. Nausea and vomiting in adult  - Ambulatory referral to Gastroenterology - ondansetron (ZOFRAN ODT) 4 MG disintegrating tablet; Take 1 tablet (4 mg total) by mouth every 8 (eight) hours as needed for nausea or vomiting.  Dispense: 30 tablet; Refill: 0  Patient have been counseled extensively about nutrition and exercise. Other issues discussed during this visit include: low cholesterol diet, weight control and daily exercise, foot care,  annual eye examinations at Ophthalmology, importance of adherence with medications and regular follow-up. We also discussed long term complications of uncontrolled diabetes and hypertension.   Return in about 3 months (around 11/04/2016) for Follow up Pain and comorbidities, Follow up HTN, Generalized Anxiety Disorder.  The patient was given clear instructions to go to ER or return to medical center if symptoms don't improve, worsen or new problems develop. The patient verbalized understanding. The patient was told to call to get lab results if they haven't heard anything in the next week.   This note has been created with Education officer, environmentalDragon speech recognition software and smart phrase technology. Any transcriptional errors are unintentional.    Jeanann LewandowskyJEGEDE, Joby Hershkowitz, MD, MHA, FACP, FAAP, CPE [facility] [place], [place] [phone]   08/04/2016, 2:56 PM

## 2016-08-04 NOTE — Progress Notes (Signed)
C/o nausea/vomiting x 2-3 weeks. Concerned about bp States generic Losartan does not work. Sumatriptan works well for HA, but makes her vomiting. Also states gen. lipitor makes her sick. Also wants glucometer meter and walker.

## 2016-08-05 LAB — MICROALBUMIN / CREATININE URINE RATIO
Creatinine, Urine: 194 mg/dL (ref 20–320)
Microalb Creat Ratio: 39 mcg/mg creat — ABNORMAL HIGH (ref <30)
Microalb, Ur: 7.6 mg/dL

## 2016-08-08 ENCOUNTER — Ambulatory Visit: Payer: No Typology Code available for payment source | Admitting: Physical Therapy

## 2016-08-08 ENCOUNTER — Ambulatory Visit: Payer: No Typology Code available for payment source | Admitting: *Deleted

## 2016-08-10 ENCOUNTER — Ambulatory Visit: Payer: Self-pay | Admitting: Physical Therapy

## 2016-08-10 ENCOUNTER — Encounter: Payer: Self-pay | Admitting: *Deleted

## 2016-08-11 ENCOUNTER — Ambulatory Visit: Payer: No Typology Code available for payment source | Admitting: Physical Therapy

## 2016-08-11 ENCOUNTER — Ambulatory Visit: Payer: No Typology Code available for payment source | Admitting: Occupational Therapy

## 2016-08-15 ENCOUNTER — Ambulatory Visit: Payer: No Typology Code available for payment source | Admitting: Physical Therapy

## 2016-08-15 ENCOUNTER — Ambulatory Visit: Payer: No Typology Code available for payment source | Admitting: *Deleted

## 2016-08-16 ENCOUNTER — Ambulatory Visit: Payer: No Typology Code available for payment source | Admitting: Physical Therapy

## 2016-08-16 ENCOUNTER — Ambulatory Visit: Payer: No Typology Code available for payment source | Admitting: Occupational Therapy

## 2016-08-16 NOTE — Progress Notes (Deleted)
Psychiatric Initial Adult Assessment   Patient Identification: Julie Gilbert MRN:  [id number] Date of Evaluation:  08/16/2016 Referral Source: *** Chief Complaint:   Visit Diagnosis: No diagnosis found.  History of Present Illness:   Julie Noblennie G Beauchaine is a 65 year old female with depression, TIA, hypertension, type 2 diabetes mellitus, IBS and asthma  Associated Signs/Symptoms: Depression Symptoms:  {DEPRESSION SYMPTOMS:20000} (Hypo) Manic Symptoms:  {BHH MANIC SYMPTOMS:22872} Anxiety Symptoms:  {BHH ANXIETY SYMPTOMS:22873} Psychotic Symptoms:  {BHH PSYCHOTIC SYMPTOMS:22874} PTSD Symptoms: {BHH PTSD SYMPTOMS:22875}  Past Psychiatric History: ***  Previous Psychotropic Medications: {YES/NO:21197}  Substance Abuse History in the last 12 months:  {yes no:314532}  Consequences of Substance Abuse: {BHH CONSEQUENCES OF SUBSTANCE ABUSE:22880}  Past Medical History:  Past Medical History:  Diagnosis Date  . Asthma   . Diabetes mellitus without complication (HCC)   . Hypertension   . IBS (irritable bowel syndrome)     Past Surgical History:  Procedure Laterality Date  . CESAREAN SECTION    . EXPLORATORY LAPAROTOMY      Family Psychiatric History: ***  Family History:  Family History  Problem Relation Age of Onset  . Cancer Maternal Grandfather   . Kidney failure Mother   . Hypertension Mother   . Heart disease Father     Social History:   Social History   Social History  . Marital status: Divorced    Spouse name: N/A  . Number of children: N/A  . Years of education: N/A   Social History Main Topics  . Smoking status: Former Games developermoker  . Smokeless tobacco: Never Used  . Alcohol use 1.2 oz/week    2 Glasses of wine per week     Comment: social  . Drug use: No  . Sexual activity: Not on file   Other Topics Concern  . Not on file   Social History Narrative  . No narrative on file    Additional Social History: ***  Allergies:   Allergies  Allergen  Reactions  . Peanuts [Peanut Oil] Anaphylaxis  . Shrimp [Shellfish Allergy] Anaphylaxis  . Latex Hives    Metabolic Disorder Labs: Lab Results  Component Value Date   HGBA1C 6.4 (H) 06/29/2016   MPG 137 06/29/2016   MPG 134 (H) 12/14/2015   No results found for: PROLACTIN Lab Results  Component Value Date   CHOL 266 (H) 06/29/2016   TRIG 118 06/29/2016   HDL 51 06/29/2016   CHOLHDL 5.2 06/29/2016   VLDL 24 06/29/2016   LDLCALC 191 (H) 06/29/2016   LDLCALC 180 (H) 05/13/2015     Current Medications: Current Outpatient Prescriptions  Medication Sig Dispense Refill  . aspirin 81 MG tablet Take 81 mg by mouth daily.    Marland Kitchen. atorvastatin (LIPITOR) 40 MG tablet Take 1 tablet (40 mg total) by mouth daily at 6 PM. 90 tablet 3  . butalbital-acetaminophen-caffeine (FIORICET) 50-325-40 MG tablet Take 1 tablet by mouth every 6 (six) hours as needed for headache. 30 tablet 0  . Fish Oil-Cholecalciferol (FISH OIL + D3) 1000-1000 MG-UNIT CAPS Take 1 tablet by mouth daily.    . fluticasone (FLONASE) 50 MCG/ACT nasal spray Place 2 sprays into both nostrils daily. (Patient not taking: Reported on 08/04/2016) 16 g 2  . linaclotide (LINZESS) 145 MCG CAPS capsule Take 1 capsule (145 mcg total) by mouth daily as needed (FOR IBS). 90 capsule 3  . losartan (COZAAR) 25 MG tablet Take 1 tablet (25 mg total) by mouth 2 (two) times daily. 180  tablet 0  . meclizine (ANTIVERT) 25 MG tablet Take 1 tablet (25 mg total) by mouth every 8 (eight) hours as needed for dizziness. 30 tablet 3  . metFORMIN (GLUCOPHAGE) 500 MG tablet Take 1 tablet (500 mg total) by mouth 2 (two) times daily with a meal. 180 tablet 3  . nortriptyline (PAMELOR) 25 MG capsule Take 1 capsule (25 mg total) by mouth at bedtime. 30 capsule 3  . omeprazole (PRILOSEC) 20 MG capsule Take 1 capsule (20 mg total) by mouth daily. 90 capsule 0  . ondansetron (ZOFRAN ODT) 4 MG disintegrating tablet Take 1 tablet (4 mg total) by mouth every 8 (eight)  hours as needed for nausea or vomiting. 30 tablet 0  . SUMAtriptan (IMITREX) 25 MG tablet TAKE 2 TABLETS BY MOUTH ONCE. MAXIMUM DAILY DOSE 200 MG (8 TABLETS) 10 tablet 0  . SUMAtriptan (IMITREX) 25 MG tablet Take 2 tablets (50 mg total) by mouth once. Maximum daily dose 200mg  10 tablet 0   No current facility-administered medications for this visit.     Neurologic: Headache: {BHH YES OR NO:22294} Seizure: {BHH YES OR NO:22294} Paresthesias:{BHH YES OR ZO:10960}O:22294}  Musculoskeletal: Strength & Muscle Tone: {desc; muscle tone:32375} Gait & Station: {PE GAIT ED AVWU:98119}ATL:22525} Patient leans: {Patient Leans:21022755}  Psychiatric Specialty Exam: ROS  There were no vitals taken for this visit.There is no height or weight on file to calculate BMI.  General Appearance: {Appearance:22683}  Eye Contact:  {BHH EYE CONTACT:22684}  Speech:  {Speech:22685}  Volume:  {Volume (PAA):22686}  Mood:  {BHH MOOD:22306}  Affect:  {Affect (PAA):22687}  Thought Process:  {Thought Process (PAA):22688}  Orientation:  {BHH ORIENTATION (PAA):22689}  Thought Content:  {Thought Content:22690}  Suicidal Thoughts:  {ST/HT (PAA):22692}  Homicidal Thoughts:  {ST/HT (PAA):22692}  Memory:  {BHH MEMORY:22881}  Judgement:  {Judgement (PAA):22694}  Insight:  {Insight (PAA):22695}  Psychomotor Activity:  {Psychomotor (PAA):22696}  Concentration:  {Concentration:21399}  Recall:  {BHH GOOD/FAIR/POOR:22877}  Fund of Knowledge:{BHH GOOD/FAIR/POOR:22877}  Language: {BHH GOOD/FAIR/POOR:22877}  Akathisia:  {BHH YES OR NO:22294}  Handed:  {Handed:22697}  AIMS (if indicated):  ***  Assets:  {Assets (PAA):22698}  ADL's:  {BHH JYN'W:29562}ADL'S:22290}  Cognition: {chl bhh cognition:304700322}  Sleep:  ***    Treatment Plan Summary: {CHL AMB BH MD TX ZHYQ:[id number]}[phone]}   Neysa Hottereina Younes Degeorge, MD 11/21/20178:38 AM

## 2016-08-17 NOTE — Telephone Encounter (Signed)
ERROR

## 2016-08-22 ENCOUNTER — Ambulatory Visit (HOSPITAL_COMMUNITY): Payer: Self-pay | Admitting: Psychiatry

## 2016-08-23 ENCOUNTER — Ambulatory Visit: Payer: Self-pay | Admitting: Physical Therapy

## 2016-08-23 ENCOUNTER — Encounter: Payer: Self-pay | Admitting: *Deleted

## 2016-08-25 ENCOUNTER — Encounter: Payer: Self-pay | Admitting: *Deleted

## 2016-08-25 ENCOUNTER — Ambulatory Visit: Payer: Self-pay | Admitting: Physical Therapy

## 2016-08-26 MED FILL — metFORMIN HCL 500 MG TABS: 500 | 30 days supply | Qty: 60 | Fill #1

## 2016-08-29 ENCOUNTER — Encounter: Payer: Self-pay | Admitting: Occupational Therapy

## 2016-08-29 ENCOUNTER — Ambulatory Visit: Payer: Self-pay | Admitting: Physical Therapy

## 2016-08-31 ENCOUNTER — Ambulatory Visit: Payer: Self-pay | Admitting: Physical Therapy

## 2016-08-31 ENCOUNTER — Encounter: Payer: Self-pay | Admitting: Occupational Therapy

## 2016-09-06 ENCOUNTER — Encounter: Payer: Self-pay | Admitting: Occupational Therapy

## 2016-09-06 ENCOUNTER — Ambulatory Visit: Payer: Self-pay | Admitting: Physical Therapy

## 2016-09-08 ENCOUNTER — Ambulatory Visit: Payer: Self-pay | Admitting: Physical Therapy

## 2016-09-08 ENCOUNTER — Encounter: Payer: Self-pay | Admitting: Occupational Therapy

## 2016-09-14 ENCOUNTER — Encounter: Payer: Self-pay | Admitting: Physical Therapy

## 2016-09-14 NOTE — Therapy (Signed)
[facility] [address] [place], [place], [address] Phone: [phone]   Fax:  [phone]  Patient Details  Name: Julie Gilbert MRN: [id number] Date of Birth: [birth year] Referring Provider:  Cammie Sickle, FNP  Encounter Date: 09/14/2016  PHYSICAL THERAPY DISCHARGE SUMMARY  Visits from Start of Care: 3  Current functional level related to goals / functional outcomes: Patient called to cancel all PT appointments as she could not control her BP.    Remaining deficits: Unknown as patient did not return for formal discharge.     Education / Equipment: HEP  Plan: Patient agrees to discharge.  Patient goals were not met. Patient is being discharged due to a change in medical status. per patient. ???         Burgandy Hackworth PT, DPT 09/14/2016, 3:18 PM  [facility] [address] [place], [place], [address] Phone: [phone]   Fax:  [phone]

## 2016-09-22 ENCOUNTER — Encounter: Payer: Self-pay | Admitting: Neurology

## 2016-09-22 ENCOUNTER — Other Ambulatory Visit (INDEPENDENT_AMBULATORY_CARE_PROVIDER_SITE_OTHER): Payer: Self-pay

## 2016-09-22 ENCOUNTER — Other Ambulatory Visit: Payer: Self-pay | Admitting: *Deleted

## 2016-09-22 ENCOUNTER — Ambulatory Visit (INDEPENDENT_AMBULATORY_CARE_PROVIDER_SITE_OTHER): Payer: Self-pay | Admitting: Neurology

## 2016-09-22 ENCOUNTER — Telehealth: Payer: Self-pay | Admitting: Neurology

## 2016-09-22 VITALS — BP 122/80 | HR 73 | Ht 67.0 in | Wt 187.0 lb

## 2016-09-22 DIAGNOSIS — R42 Dizziness and giddiness: Secondary | ICD-10-CM

## 2016-09-22 DIAGNOSIS — E119 Type 2 diabetes mellitus without complications: Secondary | ICD-10-CM

## 2016-09-22 DIAGNOSIS — Z794 Long term (current) use of insulin: Secondary | ICD-10-CM

## 2016-09-22 DIAGNOSIS — G43009 Migraine without aura, not intractable, without status migrainosus: Secondary | ICD-10-CM

## 2016-09-22 DIAGNOSIS — I10 Essential (primary) hypertension: Secondary | ICD-10-CM

## 2016-09-22 DIAGNOSIS — G459 Transient cerebral ischemic attack, unspecified: Secondary | ICD-10-CM

## 2016-09-22 LAB — LIPID PANEL
CHOLESTEROL: 168 mg/dL (ref 0–200)
HDL: 51.5 mg/dL (ref >39.00)
LDL Cholesterol: 102 mg/dL — ABNORMAL HIGH (ref 0–99)
NonHDL: 116.22
TRIGLYCERIDES: 72 mg/dL (ref 0.0–149.0)
Total CHOL/HDL Ratio: 3
VLDL: 14.4 mg/dL (ref 0.0–40.0)

## 2016-09-22 MED ORDER — ATORVASTATIN CALCIUM 80 MG PO TABS: 80.0000 mg | ORAL_TABLET | Freq: Every day | ORAL | 3 refills | Status: DC

## 2016-09-22 MED ORDER — TOPIRAMATE 25 MG PO TABS: 25.0000 mg | ORAL_TABLET | Freq: Two times a day (BID) | ORAL | 3 refills | Status: DC

## 2016-09-22 MED FILL — TOPIRAMATE 25 MG TABLET: 25 | 30 days supply | Qty: 60 | Fill #0

## 2016-09-22 NOTE — Progress Notes (Signed)
NEUROLOGY CONSULTATION NOTE  Julie Gilbert MRN: [id number] DOB: 1950-10-04  Referring provider: Dr. Hyman HopesJegede Primary care provider: Dr. Hyman HopesJegede  Reason for consult:  Migraine, TIA  HISTORY OF PRESENT ILLNESS: Julie Noblennie G. Hegg is a 65 year old right-handed female with hypetension, type 2 diabetes, migraine and chronic anemia who presents for migraine and recent TIA.  History mentioned below was obtained by patient and supplemented by her daughter (who accompanies her).  History of hospitalization supplemented by hospital notes.  She has had migraines since age 65.  They are located bi-frontal, of pounding quality and usually 10/10 intensity.  They are associated with nausea, vomiting, photophobia, phonophobia and blurred vision.  They typically last all day and occur 2 days per week.  They are not preceded by aura.  She often wakes up with it.  There are no known triggers.  Relieving factors include sumatriptan.  Current NSAIDS:  no Current analgesics:  Fioricet Current triptans:  sumatriptan Current anti-emetic:  Zofran ODT 4mg  Current muscle relaxants:  no Current anti-anxiolytic:  no Current sleep aide:  no Current Antihypertensive medications:  losartan Current Antidepressant medications:  Nortriptyline 25mg  Current Anticonvulsant medications:  no Current Vitamins/Herbal/Supplements:  Fish oil, Centrum, vitamin C Current Antihistamines/Decongestants:  no Other therapy:  No  Past abortive therapy:  Excedrin (increased blood pressure), Advil Past preventive therapy:  None.  Caffeine:  1 cup coffee daily Alcohol:  no Smoker:  no Diet:  Needs to improve.  Hydrates Exercise:  no Depression/stress:  no Sleep hygiene:  okay Family history of headache:  2 daughters with migraines.  She also reports vertigo (spinning sensation) since childhood.  It is usually daily and not specifically positional.  She takes meclizine which doesn't always work.  [facility]16 to 06/29/16 after developing left upper extremity numbness and weakness associated with her typical migraine.  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  She was admitted.  MRI of brain was personally reviewed and revealed old right basal ganglia lacunar infarct and old small cerebellar infarcts but no acute findings.  MRA of head was negative.  Carotid doppler revealed no hemodynamically significant stenosis.  TTE revealed EF 60-65% with no cardiac source of emboli.  LDL was 191.  Hgb A1c was 6.4.  She is on ASA 81mg  daily for secondary stroke prevention.  She is on Lipitor 40mg  daily.  CMP unremarkable.  She reports history of TIAs in the past.  PAST MEDICAL HISTORY: Past Medical History:  Diagnosis Date  . Asthma   . Diabetes mellitus without complication (HCC)   . Hypertension   . IBS (irritable bowel syndrome)     PAST SURGICAL HISTORY: Past Surgical History:  Procedure Laterality Date  . CESAREAN SECTION    . EXPLORATORY LAPAROTOMY      MEDICATIONS: Current Outpatient Prescriptions on File Prior to Visit  Medication Sig Dispense Refill  . aspirin 81 MG tablet Take 81 mg by mouth daily.    Marland Kitchen. atorvastatin (LIPITOR) 40 MG tablet Take 1 tablet (40 mg total) by mouth daily at 6 PM. 90 tablet 3  . Fish Oil-Cholecalciferol (FISH OIL + D3) 1000-1000 MG-UNIT CAPS Take 1 tablet by mouth daily.    Marland Kitchen. linaclotide (LINZESS) 145 MCG CAPS capsule Take 1 capsule (145 mcg total) by  mouth daily as needed (FOR IBS). 90 capsule 3  . losartan (COZAAR) 25 MG tablet Take 1 tablet (25 mg total) by mouth 2 (two) times daily. 180 tablet 0  . meclizine (ANTIVERT) 25 MG tablet Take 1 tablet (25 mg total) by mouth every 8 (eight) hours as needed for dizziness. 30 tablet 3  . metFORMIN  (GLUCOPHAGE) 500 MG tablet Take 1 tablet (500 mg total) by mouth 2 (two) times daily with a meal. 180 tablet 3  . omeprazole (PRILOSEC) 20 MG capsule Take 1 capsule (20 mg total) by mouth daily. 90 capsule 0  . ondansetron (ZOFRAN ODT) 4 MG disintegrating tablet Take 1 tablet (4 mg total) by mouth every 8 (eight) hours as needed for nausea or vomiting. 30 tablet 0   No current facility-administered medications on file prior to visit.     ALLERGIES: Allergies  Allergen Reactions  . Peanuts [Peanut Oil] Anaphylaxis  . Shrimp [Shellfish Allergy] Anaphylaxis  . Latex Hives    FAMILY HISTORY: Family History  Problem Relation Age of Onset  . Cancer Maternal Grandfather   . Kidney failure Mother   . Hypertension Mother   . Heart disease Father     SOCIAL HISTORY: Social History   Social History  . Marital status: Divorced    Spouse name: N/A  . Number of children: N/A  . Years of education: N/A   Occupational History  . Not on file.   Social History Main Topics  . Smoking status: Former Smoker    Quit date: 09/22/2012  . Smokeless tobacco: Never Used  . Alcohol use No  . Drug use: No  . Sexual activity: Not on file   Other Topics Concern  . Not on file   Social History Narrative  . No narrative on file    REVIEW OF SYSTEMS: Constitutional: No fevers, chills, or sweats, no generalized fatigue, change in appetite Eyes: No visual changes, double vision, eye pain Ear, nose and throat: No hearing loss, ear pain, nasal congestion, sore throat Cardiovascular: No chest pain, palpitations Respiratory:  No shortness of breath at rest or with exertion, wheezes GastrointestinaI: No nausea, vomiting, diarrhea, abdominal pain, fecal incontinence Genitourinary:  No dysuria, urinary retention or frequency Musculoskeletal:  No neck pain, back pain Integumentary: No rash, pruritus, skin lesions Neurological: as above Psychiatric: No depression, insomnia, anxiety Endocrine: No  palpitations, fatigue, diaphoresis, mood swings, change in appetite, change in weight, increased thirst Hematologic/Lymphatic:  No purpura, petechiae. Allergic/Immunologic: no itchy/runny eyes, nasal congestion, recent allergic reactions, rashes  PHYSICAL EXAM: Vitals:   09/22/16 0737  BP: 122/80  Pulse: 73   General: No acute distress.  Patient appears well-groomed.  Head:  Normocephalic/atraumatic Eyes:  fundi examined but not visualized Neck: supple, no paraspinal tenderness, full range of motion Back: No paraspinal tenderness Heart: regular rate and rhythm Lungs: Clear to auscultation bilaterally. Vascular: No carotid bruits. Neurological Exam: Mental status: alert and oriented to person, place, and time, recent and remote memory intact, fund of knowledge intact, attention and concentration intact, speech fluent and not dysarthric, language intact. Cranial nerves: CN I: not tested CN II: pupils equal, round and reactive to light, visual fields intact CN III, IV, VI:  full range of motion, no nystagmus, no ptosis CN V: facial sensation intact CN VII: upper and lower face symmetric CN VIII: hearing intact CN IX, X: gag intact, uvula midline CN XI: sternocleidomastoid and trapezius muscles intact CN XII: tongue midline Bulk & Tone: normal, no fasciculations. Motor:  5/5 throughout  Sensation: temperature and vibration sensation intact. Deep Tendon Reflexes:  2+ throughout, toes downgoing.  Finger to nose testing:  Without dysmetria.  Heel to shin:  Without dysmetria.  Gait:  Station and stride cautious.  Able to turn but some difficulty with tandem walk. Romberg negative.  IMPRESSION: 1.  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  However, she should be treated for secondary stroke prevention as she has prior history of TIAs and clearly has cerebrovascular disease on brain MRI. 2.  Migraine 3.  Chronic subjective  vertigo.  Unclear etiology 4.  HTN, controlled 5.  Type 2 diabetes controlled.  PLAN: 1.  Will discontinue nortriptyline and start topiramate, titrating to 25mg  twice daily.  Side effects (memory, paresthesias, kidney stones, glaucoma) discussed.  She will contact me in 5 weeks with update and adjust dose accordingly. 2.  Advised to stop Fioricet (risk of rebound headache) and sumatriptan (triptans contraindicated with cerebrovascular disease).  Instead, she will try Tylenol, limited to no more than 2 days out of the week.  Zofran for nausea. 3.  Heart healthy diabetic diet 4.  Start routine exercise 5.  Continue ASA 81mg  daily for secondary stroke prevention 6.  Continue Lipitor 40mg  daily.  Recheck fasting lipid panel.  LDL goal should be less than 70. 7.  Continue blood pressure and glycemic control 8.  Consider vestibular rehab 9.  Follow up in 3 months.  Thank you for allowing me to take part in the care of this patient.  Shon Millet, DO  CC:  Jeanann Lewandowsky, MD

## 2016-09-22 NOTE — Telephone Encounter (Signed)
-----   Message from Drema DallasAdam R Jaffe, DO sent at 09/22/2016 11:27 AM EST ----- Her LDL is 102, which is significantly better than previously (191).  However, I would like to get it less than 70.  Therefore I would like to increase her Lipitor from 40mg  daily to 80mg  daily and recheck fasting lipid panel and hepatic panel in 3 months prior to follow up.

## 2016-09-22 NOTE — Telephone Encounter (Signed)
Patient made aware. New RX sent to pharmacy.  Put on Dr. Moises BloodJaffe's reminder list to repeat blood work.

## 2016-09-22 NOTE — Patient Instructions (Signed)
Migraine Recommendations: 1.  Stop nortriptyline.  Start topiramate 25mg  twice daily.  Start with 1 tablet at bedtime for 7 days, then increase to 1 tablet twice daily.  Call in 5 weeks with update and we can adjust dose if needed. 2.  Stop sumatriptan and Fioricet.  Instead, take Tylenol (acetaminophen) as needed but limit to no more than 2 days out of the week.  Use Zofran for nausea. 3.  Limit use of pain relievers to no more than 2 days out of the week.  These medications include acetaminophen, ibuprofen, triptans and narcotics.  This will help reduce risk of rebound headaches. 4.  Be aware of common food triggers such as processed sweets, processed foods with nitrites (such as deli meat, hot dogs, sausages), foods with MSG, alcohol (such as wine), chocolate, certain cheeses, certain fruits (dried fruits, some citrus fruit), vinegar, diet soda. 4.  Avoid caffeine 5.  Routine exercise 6.  Proper sleep hygiene 7.  Stay adequately hydrated with water 8.  Keep a headache diary. 9.  Maintain proper stress management. 10.  Do not skip meals. 11.  Consider supplements:  Magnesium citrate 400mg  to 600mg  daily, riboflavin 400mg , Coenzyme Q 10 100mg  three times daily  Transient ischemic attack 1.  Continue aspirin 81mg  daily 2.  Continue atorvastatin 40mg  daily.  Check fasting lipid panel today  Follow up in 3 months but contact me in 5 weeks with update.

## 2016-09-27 ENCOUNTER — Ambulatory Visit: Payer: Self-pay | Admitting: Family Medicine

## 2016-10-20 MED FILL — ?MECLIZINE 25 MG TABLET: 25 | 10 days supply | Qty: 30 | Fill #1

## 2016-10-20 MED FILL — LOSARTAN POTASSIUM 25 MG TA: 25 | 30 days supply | Qty: 60 | Fill #1

## 2016-10-20 MED FILL — ATORVASTATIN 40 MG TABLET: 40 | 30 days supply | Qty: 30 | Fill #1

## 2016-10-21 MED FILL — metFORMIN HCL 500 MG TABS: 500 | 30 days supply | Qty: 60 | Fill #2

## 2016-11-21 MED FILL — ?METFORMIN HCL 500MG TABLET: 500 | 30 days supply | Qty: 60 | Fill #3

## 2016-12-02 MED FILL — LOSARTAN POTASSIUM 25 MG TA: 25 | 30 days supply | Qty: 60 | Fill #2

## 2016-12-21 ENCOUNTER — Ambulatory Visit: Payer: Medicare Other | Attending: Internal Medicine | Admitting: Internal Medicine

## 2016-12-21 ENCOUNTER — Encounter: Payer: Self-pay | Admitting: Internal Medicine

## 2016-12-21 VITALS — BP 144/85 | HR 91 | Temp 97.2°F | Resp 18 | Ht 67.0 in | Wt 189.0 lb

## 2016-12-21 DIAGNOSIS — Z5321 Procedure and treatment not carried out due to patient leaving prior to being seen by health care provider: Secondary | ICD-10-CM | POA: Insufficient documentation

## 2016-12-21 MED FILL — TOPIRAMATE 25 MG TABLET: 25 | 30 days supply | Qty: 60 | Fill #1

## 2016-12-21 MED FILL — SUMATRIPTAN SUCC 25 MG TAB: 25 | 26 days supply | Qty: 9 | Fill #0

## 2016-12-21 MED FILL — BUTALB-ACETAMIN-CAFF 50-325: 50-325-40 | 7 days supply | Qty: 30 | Fill #0

## 2016-12-21 MED FILL — ?MECLIZINE 25 MG TABLET: 25 | 10 days supply | Qty: 30 | Fill #2 | Status: TO

## 2016-12-21 MED FILL — metFORMIN HCL 500 MG TABS: 500 | 30 days supply | Qty: 60 | Fill #4

## 2016-12-21 MED FILL — ?ATORVASTATIN 40MG TABLET: 40 | 30 days supply | Qty: 30 | Fill #2

## 2016-12-21 NOTE — Progress Notes (Signed)
Patient is here for FU head  Patient has taken medication today. Patient has eaten today.  Patient became impatient and walked out of the facility. Prior to being seen by the provider.

## 2016-12-24 DIAGNOSIS — Z5321 Procedure and treatment not carried out due to patient leaving prior to being seen by health care provider: Secondary | ICD-10-CM | POA: Insufficient documentation

## 2016-12-24 NOTE — Progress Notes (Signed)
Patient left before evaluation

## 2016-12-26 MED FILL — LINZESS 145 MCG CAPSULE: 145 | 30 days supply | Qty: 30 | Fill #0

## 2016-12-28 ENCOUNTER — Encounter: Payer: Self-pay | Admitting: Neurology

## 2016-12-28 ENCOUNTER — Other Ambulatory Visit: Payer: Self-pay | Admitting: *Deleted

## 2016-12-28 ENCOUNTER — Ambulatory Visit (INDEPENDENT_AMBULATORY_CARE_PROVIDER_SITE_OTHER): Payer: Medicare Other | Admitting: Neurology

## 2016-12-28 VITALS — BP 144/88 | HR 70 | Ht 67.0 in | Wt 188.7 lb

## 2016-12-28 DIAGNOSIS — G43009 Migraine without aura, not intractable, without status migrainosus: Secondary | ICD-10-CM | POA: Diagnosis not present

## 2016-12-28 DIAGNOSIS — I10 Essential (primary) hypertension: Secondary | ICD-10-CM | POA: Diagnosis not present

## 2016-12-28 DIAGNOSIS — G459 Transient cerebral ischemic attack, unspecified: Secondary | ICD-10-CM | POA: Diagnosis not present

## 2016-12-28 DIAGNOSIS — E119 Type 2 diabetes mellitus without complications: Secondary | ICD-10-CM

## 2016-12-28 DIAGNOSIS — Z794 Long term (current) use of insulin: Secondary | ICD-10-CM

## 2016-12-28 NOTE — Patient Instructions (Signed)
Migraine Recommendations: 1.  Continue topiramate  twice daily 2.  When you get a migraine, you may take Aleve or acetaminophen 3.  Limit use of pain relievers to no more than 2 days out of the week.  These medications include acetaminophen, ibuprofen, triptans and narcotics.  This will help reduce risk of rebound headaches. 4.  Be aware of common food triggers such as processed sweets, processed foods with nitrites (such as deli meat, hot dogs, sausages), foods with MSG, alcohol (such as wine), chocolate, certain cheeses, certain fruits (dried fruits, some citrus fruit), vinegar, diet soda. 4.  Avoid caffeine 5.  Routine exercise 6.  Proper sleep hygiene 7.  Stay adequately hydrated with water 8.  Keep a headache diary. 9.  Maintain proper stress management. 10.  Do not skip meals. 11.  Consider supplements:  Magnesium citrate  to  daily, riboflavin , Coenzyme Q 10  three times daily  For stroke prevention: 1.  Continue aspirin  daily 2.  Continue Lipitor  daily.  We will check fasting lipid panel and CMP 3.  Follow up with your PCP regarding blood pressure 4.  Start routine cardio exercise 5.  Heart healthy diet  Follow up in 5 months

## 2016-12-28 NOTE — Progress Notes (Signed)
NEUROLOGY FOLLOW UP OFFICE NOTE  Julie Gilbert [id number]  HISTORY OF PRESENT ILLNESS: Julie Gilbert is a 66 year old right-handed female with hypetension, type 2 diabetes, migraine and chronic anemia who follows up for migraine and TIA.    UPDATE: Improved. Intensity:  7/10 Duration:  All day (but does not take abortive therapy) Frequency:  5 days a month Frequency of abortive medication: no Current NSAIDS:  no Current analgesics:  no Current triptans:  no Current anti-emetic:  Zofran ODT  Current muscle relaxants:  no Current anti-anxiolytic:  no Current sleep aide:  no Current Antihypertensive medications:  losartan Current Antidepressant medications:  no Current Anticonvulsant medications:  topiramate w5mg  twice daily Current Vitamins/Herbal/Supplements:  Fish oil, Centrum, vitamin C Current Antihistamines/Decongestants:  no Other therapy:  No   Caffeine:  1 cup coffee daily Alcohol:  no Smoker:  no Diet:  Needs to improve.  Hydrates Exercise:  no Depression/stress:  no Sleep hygiene:  okay  She takes ASA  for secondary stroke prevention.  She also takes Lipitor .  She did not have a lipid profile rechecked.  HISTORY: Migraines: She has had migraines since age 6.  They are located bi-frontal, of pounding quality and usually 10/10 intensity.  They are associated with nausea, vomiting, photophobia, phonophobia and blurred vision.  They typically last all day and occur 2 days per week.  They are not preceded by aura.  She often wakes up with it.  There are no known triggers.  Relieving factors include sumatriptan.    Past abortive therapy:  Excedrin (increased blood pressure), Advil, Fioricet, sumatriptan Past preventive therapy:  Nortriptyline    Family history of headache:  2 daughters with migraines.   She also reports vertigo (spinning sensation) since childhood.  It is usually daily and not specifically positional.  She takes meclizine which  doesn't always work.   TIA: She was admitted to [facility] from 06/28/16 to 06/29/16 after developing left upper extremity numbness and weakness associated with her typical migraine.  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  She was admitted.  MRI of brain was personally reviewed and revealed old right basal ganglia lacunar infarct and old small cerebellar infarcts but no acute findings.  MRA of head was negative.  Carotid doppler revealed no hemodynamically significant stenosis.  TTE revealed EF 60-65% with no cardiac source of emboli.  LDL was 191.  Hgb A1c was 6.4.  She is on ASA  daily for secondary stroke prevention.  She is on Lipitor  daily.  CMP unremarkable.   She reports history of TIAs in the past.  PAST MEDICAL HISTORY: Past Medical History:  Diagnosis Date  . Asthma   . Diabetes mellitus without complication (HCC)   . Hypertension   . IBS (irritable bowel syndrome)     MEDICATIONS: Current Outpatient Prescriptions on File Prior to Visit  Medication Sig Dispense Refill  . aspirin 81 MG tablet Take 81 mg by mouth daily.    . Fish Oil-Cholecalciferol (FISH OIL + D3) 1000-1000 MG-UNIT CAPS Take 1 tablet by mouth daily.    Marland Kitchen linaclotide (LINZESS) 145 MCG CAPS capsule Take 1 capsule (145 mcg total) by mouth daily as needed (FOR IBS). 90 capsule 3  . meclizine (ANTIVERT) 25 MG tablet Take 1 tablet (25 mg  total) by mouth every 8 (eight) hours as needed for dizziness. 30 tablet 3  . metFORMIN (GLUCOPHAGE) 500 MG tablet Take 1 tablet (500 mg total) by mouth 2 (two) times daily with a meal. 180 tablet 3  . topiramate (TOPAMAX) 25 MG tablet Take 1 tablet (25 mg total) by mouth 2 (two) times daily. 60 tablet 3  . losartan (COZAAR) 25 MG tablet Take 1 tablet (25 mg  total) by mouth 2 (two) times daily. (Patient not taking: Reported on 12/28/2016) 180 tablet 0  . omeprazole (PRILOSEC) 20 MG capsule Take 1 capsule (20 mg total) by mouth daily. (Patient not taking: Reported on 12/28/2016) 90 capsule 0  . ondansetron (ZOFRAN ODT) 4 MG disintegrating tablet Take 1 tablet (4 mg total) by mouth every 8 (eight) hours as needed for nausea or vomiting. (Patient not taking: Reported on 12/28/2016) 30 tablet 0   No current facility-administered medications on file prior to visit.     ALLERGIES: Allergies  Allergen Reactions  . Peanuts [Peanut Oil] Anaphylaxis  . Shrimp [Shellfish Allergy] Anaphylaxis  . Latex Hives    FAMILY HISTORY: Family History  Problem Relation Age of Onset  . Cancer Maternal Grandfather   . Kidney failure Mother   . Hypertension Mother   . Heart disease Father     SOCIAL HISTORY: Social History   Social History  . Marital status: Divorced    Spouse name: N/A  . Number of children: N/A  . Years of education: N/A   Occupational History  . Not on file.   Social History Main Topics  . Smoking status: Former Smoker    Quit date: 09/22/2012  . Smokeless tobacco: Never Used  . Alcohol use No  . Drug use: No  . Sexual activity: Not on file   Other Topics Concern  . Not on file   Social History Narrative  . No narrative on file    REVIEW OF SYSTEMS: Constitutional: No fevers, chills, or sweats, no generalized fatigue, change in appetite Eyes: No visual changes, double vision, eye pain Ear, nose and throat: No hearing loss, ear pain, nasal congestion, sore throat Cardiovascular: No chest pain, palpitations Respiratory:  No shortness of breath at rest or with exertion, wheezes GastrointestinaI: nausea Genitourinary:  No dysuria, urinary retention or frequency Musculoskeletal:  No neck pain, back pain Integumentary: No rash, pruritus, skin lesions Neurological: as above Psychiatric: No depression, insomnia,  anxiety Endocrine: No palpitations, fatigue, diaphoresis, mood swings, change in appetite, change in weight, increased thirst Hematologic/Lymphatic:  No purpura, petechiae. Allergic/Immunologic: no itchy/runny eyes, nasal congestion, recent allergic reactions, rashes  PHYSICAL EXAM: Vitals:   12/28/16 1110  BP: (!) 144/88  Pulse: 70   General: No acute distress.  Patient appears well-groomed.  normal body habitus. Head:  Normocephalic/atraumatic Eyes:  Fundi examined but not visualized Neck: supple, no paraspinal tenderness, full range of motion Heart:  Regular rate and rhythm Lungs:  Clear to auscultation bilaterally Back: No paraspinal tenderness Neurological Exam: alert and oriented to person, place, and time. Attention span and concentration intact, recent and remote memory intact, fund of knowledge intact.  Speech fluent and not dysarthric, language intact.  CN II-XII intact. Bulk and tone normal, muscle strength 5/5 throughout.  Sensation to light touch  intact.  Deep tendon reflexes 2+ throughout.  Finger to nose testing intact.  Gait normal, Romberg negative.  IMPRESSION: 1.  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  However, she should  be treated for secondary stroke prevention as she has prior history of TIAs and clearly has cerebrovascular disease on brain MRI. 2.  Migraine 3.  Chronic subjective vertigo.  Unclear etiology 4.  HTN, controlled 5.  Type 2 diabetes controlled.  PLAN: 1.  Continue topiramate  twice daily 2.  When you get a migraine, you may take Aleve or acetaminophen 3.  Limit use of pain relievers to no more than 2 days out of the week.  These medications include acetaminophen, ibuprofen, triptans and narcotics.  This will help reduce risk of rebound headaches. 4.  Be aware of common food triggers such as processed sweets, processed foods with nitrites (such as deli meat, hot dogs, sausages),  foods with MSG, alcohol (such as wine), chocolate, certain cheeses, certain fruits (dried fruits, some citrus fruit), vinegar, diet soda. 4.  Avoid caffeine 5.  Routine exercise 6.  Proper sleep hygiene 7.  Stay adequately hydrated with water 8.  Keep a headache diary. 9.  Maintain proper stress management. 10.  Do not skip meals. 11.  Consider supplements:  Magnesium citrate  to  daily, riboflavin , Coenzyme Q 10  three times daily  For stroke prevention: 1.  Continue aspirin  daily 2.  Continue Lipitor  daily.  We will check fasting lipid panel and CMP 3.  Follow up with your PCP regarding blood pressure 4.  Start routine cardio exercise 5.  Heart healthy diet  Follow up in 5 months  Shon Millet, DO  CC: Jeanann Lewandowsky, MD

## 2016-12-28 NOTE — Telephone Encounter (Signed)
Patient verified DOB Patient is aware of the prescription on file at [facility] is a historical script from 2016 which does not reflect the proper dosing. Patient was seen by the neurologist today and will return on Friday for a fasting lab check which will determine the dosage of the atorvastatin.  Patient expressed her understanding and stated she will request the refill with the provider on Friday.

## 2016-12-30 ENCOUNTER — Telehealth: Payer: Self-pay

## 2016-12-30 ENCOUNTER — Other Ambulatory Visit (INDEPENDENT_AMBULATORY_CARE_PROVIDER_SITE_OTHER): Payer: Medicare Other

## 2016-12-30 ENCOUNTER — Other Ambulatory Visit: Payer: Self-pay | Admitting: Family Medicine

## 2016-12-30 DIAGNOSIS — G43009 Migraine without aura, not intractable, without status migrainosus: Secondary | ICD-10-CM

## 2016-12-30 DIAGNOSIS — I10 Essential (primary) hypertension: Secondary | ICD-10-CM | POA: Diagnosis not present

## 2016-12-30 DIAGNOSIS — G459 Transient cerebral ischemic attack, unspecified: Secondary | ICD-10-CM | POA: Diagnosis not present

## 2016-12-30 DIAGNOSIS — G458 Other transient cerebral ischemic attacks and related syndromes: Secondary | ICD-10-CM

## 2016-12-30 LAB — LIPID PANEL
CHOLESTEROL: 179 mg/dL (ref 0–200)
HDL: 53.2 mg/dL (ref >39.00)
LDL Cholesterol: 109 mg/dL — ABNORMAL HIGH (ref 0–99)
NONHDL: 125.63
Total CHOL/HDL Ratio: 3
Triglycerides: 82 mg/dL (ref 0.0–149.0)
VLDL: 16.4 mg/dL (ref 0.0–40.0)

## 2016-12-30 LAB — COMPLETE METABOLIC PANEL WITH GFR
ALBUMIN: 4.3 g/dL (ref 3.6–5.1)
ALK PHOS: 66 U/L (ref 33–130)
ALT: 12 U/L (ref 6–29)
AST: 15 U/L (ref 10–35)
BILIRUBIN TOTAL: 0.4 mg/dL (ref 0.2–1.2)
BUN: 24 mg/dL (ref 7–25)
CALCIUM: 9.6 mg/dL (ref 8.6–10.4)
CO2: 23 mmol/L (ref 20–31)
Chloride: 105 mmol/L (ref 98–110)
Creat: 1.39 mg/dL — ABNORMAL HIGH (ref 0.50–0.99)
GFR, Est African American: 47 mL/min — ABNORMAL LOW (ref >=60)
GFR, Est Non African American: 40 mL/min — ABNORMAL LOW (ref >=60)
GLUCOSE: 98 mg/dL (ref 65–99)
Potassium: 4.7 mmol/L (ref 3.5–5.3)
Sodium: 140 mmol/L (ref 135–146)
TOTAL PROTEIN: 7.4 g/dL (ref 6.1–8.1)

## 2016-12-30 MED ORDER — ATORVASTATIN CALCIUM 80 MG PO TABS: 80.0000 mg | ORAL_TABLET | Freq: Every day | ORAL | 2 refills | Status: DC

## 2016-12-30 NOTE — Telephone Encounter (Signed)
-----   Message from Drema Dallas, DO sent at 12/30/2016  1:39 PM EDT ----- The LDL is 109.  I would like it less than 70.  Recommend increasing Lipitor to  daily and recheck fasting lipid panel in 3 months.

## 2016-12-30 NOTE — Telephone Encounter (Signed)
Spoke to patient. Gave results and instructions. Patient verbalized understanding. Pt has a few Lipitor  tabs left will take 2 tabs ( )  until completed.  Requested  Lipitor 80 mg tablet. Advised to take 1 tab qd when she picks up new Rx. Patient agreed. Sent Rx

## 2017-01-02 ENCOUNTER — Telehealth: Payer: Self-pay

## 2017-01-02 DIAGNOSIS — I10 Essential (primary) hypertension: Secondary | ICD-10-CM

## 2017-01-02 NOTE — Telephone Encounter (Signed)
Spoke to patient. Gave results and instructions. Patient verbalized understanding. Placed order for BMP w/ GFR.

## 2017-01-02 NOTE — Telephone Encounter (Signed)
-----   Message from Drema Dallas, DO sent at 01/02/2017  9:33 AM EDT ----- I reviewed her recent labs that includes kidney and liver function.  It shows that her kidneys are not functioning as well as they should.  Sometimes Topamax may affect kidneys, but she is on such a low dose that I don't think this is the case.  I would like her to increase water intake and we can recheck BMP with GFR in one week.

## 2017-01-04 ENCOUNTER — Encounter: Payer: Self-pay | Admitting: Internal Medicine

## 2017-01-04 ENCOUNTER — Ambulatory Visit: Payer: Medicare Other | Attending: Internal Medicine | Admitting: Internal Medicine

## 2017-01-04 ENCOUNTER — Encounter: Payer: Self-pay | Admitting: Neurology

## 2017-01-04 VITALS — BP 165/69 | HR 73 | Temp 97.9°F | Resp 18 | Ht 67.0 in | Wt 191.6 lb

## 2017-01-04 DIAGNOSIS — Z7982 Long term (current) use of aspirin: Secondary | ICD-10-CM | POA: Insufficient documentation

## 2017-01-04 DIAGNOSIS — J45909 Unspecified asthma, uncomplicated: Secondary | ICD-10-CM | POA: Diagnosis not present

## 2017-01-04 DIAGNOSIS — N342 Other urethritis: Secondary | ICD-10-CM | POA: Diagnosis not present

## 2017-01-04 DIAGNOSIS — E118 Type 2 diabetes mellitus with unspecified complications: Secondary | ICD-10-CM | POA: Diagnosis not present

## 2017-01-04 DIAGNOSIS — Z7984 Long term (current) use of oral hypoglycemic drugs: Secondary | ICD-10-CM | POA: Diagnosis not present

## 2017-01-04 DIAGNOSIS — Z1211 Encounter for screening for malignant neoplasm of colon: Secondary | ICD-10-CM | POA: Insufficient documentation

## 2017-01-04 DIAGNOSIS — Z888 Allergy status to other drugs, medicaments and biological substances status: Secondary | ICD-10-CM | POA: Diagnosis not present

## 2017-01-04 DIAGNOSIS — Z79899 Other long term (current) drug therapy: Secondary | ICD-10-CM | POA: Diagnosis not present

## 2017-01-04 DIAGNOSIS — F331 Major depressive disorder, recurrent, moderate: Secondary | ICD-10-CM | POA: Diagnosis not present

## 2017-01-04 DIAGNOSIS — K219 Gastro-esophageal reflux disease without esophagitis: Secondary | ICD-10-CM

## 2017-01-04 DIAGNOSIS — Z1239 Encounter for other screening for malignant neoplasm of breast: Secondary | ICD-10-CM

## 2017-01-04 DIAGNOSIS — K581 Irritable bowel syndrome with constipation: Secondary | ICD-10-CM | POA: Diagnosis not present

## 2017-01-04 DIAGNOSIS — Z91013 Allergy to seafood: Secondary | ICD-10-CM | POA: Diagnosis not present

## 2017-01-04 DIAGNOSIS — I10 Essential (primary) hypertension: Secondary | ICD-10-CM | POA: Insufficient documentation

## 2017-01-04 DIAGNOSIS — Z1231 Encounter for screening mammogram for malignant neoplasm of breast: Secondary | ICD-10-CM | POA: Diagnosis not present

## 2017-01-04 DIAGNOSIS — K5900 Constipation, unspecified: Secondary | ICD-10-CM | POA: Insufficient documentation

## 2017-01-04 DIAGNOSIS — E119 Type 2 diabetes mellitus without complications: Secondary | ICD-10-CM | POA: Diagnosis not present

## 2017-01-04 DIAGNOSIS — Z8673 Personal history of transient ischemic attack (TIA), and cerebral infarction without residual deficits: Secondary | ICD-10-CM | POA: Insufficient documentation

## 2017-01-04 DIAGNOSIS — H2513 Age-related nuclear cataract, bilateral: Secondary | ICD-10-CM | POA: Diagnosis not present

## 2017-01-04 DIAGNOSIS — H43392 Other vitreous opacities, left eye: Secondary | ICD-10-CM | POA: Diagnosis not present

## 2017-01-04 DIAGNOSIS — R110 Nausea: Secondary | ICD-10-CM | POA: Insufficient documentation

## 2017-01-04 HISTORY — DX: Major depressive disorder, recurrent, moderate: F33.1

## 2017-01-04 LAB — POCT URINALYSIS DIPSTICK
Bilirubin, UA: NEGATIVE
GLUCOSE UA: NEGATIVE
KETONES UA: NEGATIVE
Nitrite, UA: NEGATIVE
RBC UA: NEGATIVE
UROBILINOGEN UA: 0.2 U/dL
pH, UA: 5.5 (ref 5.0–8.0)

## 2017-01-04 LAB — GLUCOSE, POCT (MANUAL RESULT ENTRY): POC Glucose: 98 mg/dl (ref 70–99)

## 2017-01-04 LAB — HM DIABETES EYE EXAM

## 2017-01-04 LAB — POCT GLYCOSYLATED HEMOGLOBIN (HGB A1C): Hemoglobin A1C: 5.8

## 2017-01-04 MED ORDER — ATORVASTATIN CALCIUM 80 MG PO TABS: 80.0000 mg | ORAL_TABLET | Freq: Every day | ORAL | 3 refills | Status: DC

## 2017-01-04 MED ORDER — LINACLOTIDE 145 MCG PO CAPS: 145.0000 ug | ORAL_CAPSULE | Freq: Every day | ORAL | 3 refills | Status: DC | PRN

## 2017-01-04 MED ORDER — CEPHALEXIN 500 MG PO CAPS: 500.0000 mg | ORAL_CAPSULE | Freq: Two times a day (BID) | ORAL | 0 refills | Status: AC

## 2017-01-04 MED ORDER — METFORMIN HCL 500 MG PO TABS: 500.0000 mg | ORAL_TABLET | Freq: Two times a day (BID) | ORAL | 3 refills | Status: DC

## 2017-01-04 MED ORDER — PANTOPRAZOLE SODIUM 40 MG PO TBEC: 40.0000 mg | DELAYED_RELEASE_TABLET | Freq: Every day | ORAL | 3 refills | Status: DC

## 2017-01-04 MED ORDER — LOSARTAN POTASSIUM 25 MG PO TABS: 25.0000 mg | ORAL_TABLET | Freq: Two times a day (BID) | ORAL | 3 refills | Status: DC

## 2017-01-04 MED ORDER — TOPIRAMATE 25 MG PO TABS: 25.0000 mg | ORAL_TABLET | Freq: Two times a day (BID) | ORAL | 3 refills | Status: DC

## 2017-01-04 NOTE — Progress Notes (Signed)
Julie Gilbert, is a 66 y.o. female  NFA:[id number]  ION:[id number]  DOB - 10-14-1950  Chief Complaint  Patient presents with  . Hypertension  . Diabetes      Subjective:   Julie Gilbert is a 66 y.o. female with history of hypertension, type 2 diabetes mellitus, IBS and Asthma here today for a follow up visit and medication refill. Patient complaint of ongoing nausea but no more vomiting. She follows up regularly with Neurologist, she claims adherence with her medications but requesting increasing the dose of her Prilosec because her reflux is getting worse. She is however already on the highest dose of Prilosec. She denies any fall at home. ADL is intact. Patient has No headache, No chest pain, No abdominal pain - No Nausea, No new weakness tingling or numbness, No Cough - SOB. Patient is due for Colonoscopy and Mammogram.  "Patient was admitted to [facility] from 06/28/16 to 06/29/16 after developing left upper extremity numbness and weakness associated with her typical migraine. CT of head revealed probable chronic lacunar infarct in the right caudate, as well as a faint asymmetric lucency in the right anterior limb which may represent either age-indeterminate lacunar infarct vs perivascular space. She was admitted. MRI of brain revealed old right basal ganglia lacunar infarct and old small cerebellar infarcts but no acute findings. MRA of head was negative. Carotid doppler revealed no hemodynamically significant stenosis.  TTE revealed EF 60-65% with no cardiac source of emboli.  LDL was 191.  Hgb A1c was 6.4.  She is on ASA  daily for secondary stroke prevention. She is now on Lipitor  daily".   Problem  Moderate Episode of Recurrent Major Depressive Disorder (Hcc)  Infective Urethritis  Colon Cancer Screening   ALLERGIES: Allergies  Allergen Reactions  . Peanuts [Peanut Oil] Anaphylaxis  . Shrimp [Shellfish Allergy] Anaphylaxis  . Latex Hives   PAST MEDICAL  HISTORY: Past Medical History:  Diagnosis Date  . Asthma   . Diabetes mellitus without complication (HCC)   . Hypertension   . IBS (irritable bowel syndrome)    MEDICATIONS AT HOME: Prior to Admission medications   Medication Sig Start Date End Date Taking? Authorizing Provider  aspirin 81 MG tablet Take 81 mg by mouth daily.   Yes Historical Provider, MD  atorvastatin (LIPITOR) 80 MG tablet Take 1 tablet (80 mg total) by mouth daily. 01/04/17  Yes Quentin Angst, MD  Fish Oil-Cholecalciferol (FISH OIL + D3) 1000-1000 MG-UNIT CAPS Take 1 tablet by mouth daily.   Yes Historical Provider, MD  linaclotide (LINZESS) 145 MCG CAPS capsule Take 1 capsule (145 mcg total) by mouth daily as needed (FOR IBS). 01/04/17  Yes Quentin Angst, MD  meclizine (ANTIVERT) 25 MG tablet Take 1 tablet (25 mg total) by mouth every 8 (eight) hours as needed for dizziness. 07/07/16  Yes Quentin Angst, MD  metFORMIN (GLUCOPHAGE) 500 MG tablet Take 1 tablet (500 mg total) by mouth 2 (two) times daily with a meal. 01/04/17  Yes Katelyn Broadnax E Hyman Hopes, MD  ondansetron (ZOFRAN ODT) 4 MG disintegrating tablet Take 1 tablet (4 mg total) by mouth every 8 (eight) hours as needed for nausea or vomiting. 08/04/16  Yes Quentin Angst, MD  topiramate (TOPAMAX) 25 MG tablet Take 1 tablet (25 mg total) by mouth 2 (two) times daily. 01/04/17  Yes Alejah Aristizabal Annitta Needs, MD  cephALEXin (KEFLEX) 500 MG capsule Take 1 capsule (500 mg total) by mouth 2 (two) times daily. 01/04/17 01/09/17  Quentin Angst, MD  losartan (COZAAR) 25 MG tablet Take 1 tablet (25 mg total) by mouth 2 (two) times daily. 01/04/17   Quentin Angst, MD  pantoprazole (PROTONIX) 40 MG tablet Take 1 tablet (40 mg total) by mouth daily. 01/04/17   Quentin Angst, MD   Objective:   Vitals:   01/04/17 0955  BP: (!) 165/69  Pulse: 73  Resp: 18  Temp: 97.9 F (36.6 C)  TempSrc: Oral  SpO2: 100%  Weight: 191 lb 9.6 oz (86.9 kg)  Height: 5'  7" (1.702 m)   Exam General appearance : Awake, alert, not in any distress. Speech Clear. Not toxic looking HEENT: Atraumatic and Normocephalic, pupils equally reactive to light and accomodation Neck: Supple, no JVD. No cervical lymphadenopathy.  Chest: Good air entry bilaterally, no added sounds  CVS: S1 S2 regular, no murmurs.  Abdomen: Bowel sounds present, Non tender and not distended with no gaurding, rigidity or rebound. Extremities: B/L Lower Ext shows no edema, both legs are warm to touch Neurology: Awake alert, and oriented X 3, CN II-XII intact, Non focal Skin: No Rash  Data Review Lab Results  Component Value Date   HGBA1C 5.8 01/04/2017   HGBA1C 6.4 (H) 06/29/2016   HGBA1C 6.3 (H) 12/14/2015    Assessment & Plan   1. Controlled type 2 diabetes mellitus with complication, without long-term current use of insulin (HCC)  - POCT A1C - Glucose (CBG) Refill - metFORMIN (GLUCOPHAGE) 500 MG tablet; Take 1 tablet (500 mg total) by mouth 2 (two) times daily with a meal.  Dispense: 180 tablet; Refill: 3  2. Essential hypertension Refill - losartan (COZAAR) 25 MG tablet; Take 1 tablet (25 mg total) by mouth 2 (two) times daily.  Dispense: 180 tablet; Refill: 3  3. Gastroesophageal reflux disease without esophagitis  - Discontinue Omeprazole - Start - pantoprazole (PROTONIX) 40 MG tablet; Take 1 tablet (40 mg total) by mouth daily.  Dispense: 30 tablet; Refill: 3  4. Irritable bowel syndrome with constipation  - linaclotide (LINZESS) 145 MCG CAPS capsule; Take 1 capsule (145 mcg total) by mouth daily as needed (FOR IBS).  Dispense: 90 capsule; Refill: 3  5. Colon cancer screening  - Ambulatory referral to Gastroenterology  6. Infective urethritis Prescribed - cephALEXin (KEFLEX) 500 MG capsule; Take 1 capsule (500 mg total) by mouth 2 (two) times daily.  Dispense: 10 capsule; Refill: 0  7. Breast cancer screening  - MM Digital Screening; Future  Patient have been  counseled extensively about nutrition and exercise. Other issues discussed during this visit include: low cholesterol diet, weight control and daily exercise, foot care, annual eye examinations at Ophthalmology, importance of adherence with medications and regular follow-up. We also discussed long term complications of uncontrolled diabetes and hypertension.   Return in about 3 months (around 04/05/2017) for Hemoglobin A1C and Follow up, DM, Follow up HTN, Follow up Pain and comorbidities.  The patient was given clear instructions to go to ER or return to medical center if symptoms don't improve, worsen or new problems develop. The patient verbalized understanding. The patient was told to call to get lab results if they haven't heard anything in the next week.   This note has been created with Education officer, environmental. Any transcriptional errors are unintentional.    Jeanann Lewandowsky, MD, MHA, Maxwell Caul, CPE [facility] [place], [place] [phone]   01/04/2017, 10:42 AM

## 2017-01-04 NOTE — Patient Instructions (Signed)
Diabetes and Foot Care Diabetes may cause you to have problems because of poor blood supply (circulation) to your feet and legs. This may cause the skin on your feet to become thinner, break easier, and heal more slowly. Your skin may become dry, and the skin may peel and crack. You may also have nerve damage in your legs and feet causing decreased feeling in them. You may not notice minor injuries to your feet that could lead to infections or more serious problems. Taking care of your feet is one of the most important things you can do for yourself. Follow these instructions at home:  Wear shoes at all times, even in the house. Do not go barefoot. Bare feet are easily injured.  Check your feet daily for blisters, cuts, and redness. If you cannot see the bottom of your feet, use a mirror or ask someone for help.  Wash your feet with warm water (do not use hot water) and mild soap. Then pat your feet and the areas between your toes until they are completely dry. Do not soak your feet as this can dry your skin.  Apply a moisturizing lotion or petroleum jelly (that does not contain alcohol and is unscented) to the skin on your feet and to dry, brittle toenails. Do not apply lotion between your toes.  Trim your toenails straight across. Do not dig under them or around the cuticle. File the edges of your nails with an emery board or nail file.  Do not cut corns or calluses or try to remove them with medicine.  Wear clean socks or stockings every day. Make sure they are not too tight. Do not wear knee-high stockings since they may decrease blood flow to your legs.  Wear shoes that fit properly and have enough cushioning. To break in new shoes, wear them for just a few hours a day. This prevents you from injuring your feet. Always look in your shoes before you put them on to be sure there are no objects inside.  Do not cross your legs. This may decrease the blood flow to your feet.  If you find a  minor scrape, cut, or break in the skin on your feet, keep it and the skin around it clean and dry. These areas may be cleansed with mild soap and water. Do not cleanse the area with peroxide, alcohol, or iodine.  When you remove an adhesive bandage, be sure not to damage the skin around it.  If you have a wound, look at it several times a day to make sure it is healing.  Do not use heating pads or hot water bottles. They may burn your skin. If you have lost feeling in your feet or legs, you may not know it is happening until it is too late.  Make sure your health care provider performs a complete foot exam at least annually or more often if you have foot problems. Report any cuts, sores, or bruises to your health care provider immediately. Contact a health care provider if:  You have an injury that is not healing.  You have cuts or breaks in the skin.  You have an ingrown nail.  You notice redness on your legs or feet.  You feel burning or tingling in your legs or feet.  You have pain or cramps in your legs and feet.  Your legs or feet are numb.  Your feet always feel cold. Get help right away if:  There is increasing   redness, swelling, or pain in or around a wound.  There is a red line that goes up your leg.  Pus is coming from a wound.  You develop a fever or as directed by your health care provider.  You notice a bad smell coming from an ulcer or wound. This information is not intended to replace advice given to you by your health care provider. Make sure you discuss any questions you have with your health care provider. Document Released: 09/09/2000 Document Revised: 02/18/2016 Document Reviewed: 02/19/2013 Elsevier Interactive Patient Education  2017 [employer] Diabetes Mellitus and Exercise Exercising regularly is important for your overall health, especially when you have diabetes (diabetes mellitus). Exercising is not only about losing weight. It has many health  benefits, such as increasing muscle strength and bone density and reducing body fat and stress. This leads to improved fitness, flexibility, and endurance, all of which result in better overall health. Exercise has additional benefits for people with diabetes, including:  Reducing appetite.  Helping to lower and control blood glucose.  Lowering blood pressure.  Helping to control amounts of fatty substances (lipids) in the blood, such as cholesterol and triglycerides.  Helping the body to respond better to insulin (improving insulin sensitivity).  Reducing how much insulin the body needs.  Decreasing the risk for heart disease by:  Lowering cholesterol and triglyceride levels.  Increasing the levels of good cholesterol.  Lowering blood glucose levels. What is my activity plan? Your health care provider or certified diabetes educator can help you make a plan for the type and frequency of exercise (activity plan) that works for you. Make sure that you:  Do at least 150 minutes of moderate-intensity or vigorous-intensity exercise each week. This could be brisk walking, biking, or water aerobics.  Do stretching and strength exercises, such as yoga or weightlifting, at least 2 times a week.  Spread out your activity over at least 3 days of the week.  Get some form of physical activity every day.  Do not go more than 2 days in a row without some kind of physical activity.  Avoid being inactive for more than 90 minutes at a time. Take frequent breaks to walk or stretch.  Choose a type of exercise or activity that you enjoy, and set realistic goals.  Start slowly, and gradually increase the intensity of your exercise over time. What do I need to know about managing my diabetes?  Check your blood glucose before and after exercising.  If your blood glucose is higher than 240 mg/dL (13.3 mmol/L) before you exercise, check your urine for ketones. If you have ketones in your urine, do  not exercise until your blood glucose returns to normal.  Know the symptoms of low blood glucose (hypoglycemia) and how to treat it. Your risk for hypoglycemia increases during and after exercise. Common symptoms of hypoglycemia can include:  Hunger.  Anxiety.  Sweating and feeling clammy.  Confusion.  Dizziness or feeling light-headed.  Increased heart rate or palpitations.  Blurry vision.  Tingling or numbness around the mouth, lips, or tongue.  Tremors or shakes.  Irritability.  Keep a rapid-acting carbohydrate snack available before, during, and after exercise to help prevent or treat hypoglycemia.  Avoid injecting insulin into areas of the body that are going to be exercised. For example, avoid injecting insulin into:  The arms, when playing tennis.  The legs, when jogging.  Keep records of your exercise habits. Doing this can help you and your health   care provider adjust your diabetes management plan as needed. Write down:  Food that you eat before and after you exercise.  Blood glucose levels before and after you exercise.  The type and amount of exercise you have done.  When your insulin is expected to peak, if you use insulin. Avoid exercising at times when your insulin is peaking.  When you start a new exercise or activity, work with your health care provider to make sure the activity is safe for you, and to adjust your insulin, medicines, or food intake as needed.  Drink plenty of water while you exercise to prevent dehydration or heat stroke. Drink enough fluid to keep your urine clear or pale yellow. This information is not intended to replace advice given to you by your health care provider. Make sure you discuss any questions you have with your health care provider. Document Released: 12/03/2003 Document Revised: 04/01/2016 Document Reviewed: 02/22/2016 Elsevier Interactive Patient Education  2017 [employer] Blood Glucose Monitoring,  Adult Monitoring your blood sugar (glucose) helps you manage your diabetes. It also helps you and your health care provider determine how well your diabetes management plan is working. Blood glucose monitoring involves checking your blood glucose as often as directed, and keeping a record (log) of your results over time. Why should I monitor my blood glucose? Checking your blood glucose regularly can:  Help you understand how food, exercise, illnesses, and medicines affect your blood glucose.  Let you know what your blood glucose is at any time. You can quickly tell if you are having low blood glucose (hypoglycemia) or high blood glucose (hyperglycemia).  Help you and your health care provider adjust your medicines as needed. When should I check my blood glucose? Follow instructions from your health care provider about how often to check your blood glucose. This may depend on:  The type of diabetes you have.  How well-controlled your diabetes is.  Medicines you are taking. If you have type 1 diabetes:   Check your blood glucose at least 2 times a day.  Also check your blood glucose:  Before every insulin injection.  Before and after exercise.  Between meals.  2 hours after a meal.  Occasionally between 2:00 a.m. and 3:00 a.m., as directed.  Before potentially dangerous tasks, like driving or using heavy machinery.  At bedtime.  You may need to check your blood glucose more often, up to 6-10 times a day:  If you use an insulin pump.  If you need multiple daily injections (MDI).  If your diabetes is not well-controlled.  If you are ill.  If you have a history of severe hypoglycemia.  If you have a history of not knowing when your blood glucose is getting low (hypoglycemia unawareness). If you have type 2 diabetes:   If you take insulin or other diabetes medicines, check your blood glucose at least 2 times a day.  If you are on intensive insulin therapy, check your  blood glucose at least 4 times a day. Occasionally, you may also need to check between 2:00 a.m. and 3:00 a.m., as directed.  Also check your blood glucose:  Before and after exercise.  Before potentially dangerous tasks, like driving or using heavy machinery.  You may need to check your blood glucose more often if:  Your medicine is being adjusted.  Your diabetes is not well-controlled.  You are ill. What is a blood glucose log?  A blood glucose log is a record of your blood   glucose readings. It helps you and your health care provider:  Look for patterns in your blood glucose over time.  Adjust your diabetes management plan as needed.  Every time you check your blood glucose, write down your result and notes about things that may be affecting your blood glucose, such as your diet and exercise for the day.  Most glucose meters store a record of glucose readings in the meter. Some meters allow you to download your records to a computer. How do I check my blood glucose? Follow these steps to get accurate readings of your blood glucose: Supplies needed    Blood glucose meter.  Test strips for your meter. Each meter has its own strips. You must use the strips that come with your meter.  A needle to prick your finger (lancet). Do not use lancets more than once.  A device that holds the lancet (lancing device).  A journal or log book to write down your results. Procedure   Wash your hands with soap and water.  Prick the side of your finger (not the tip) with the lancet. Use a different finger each time.  Gently rub the finger until a small drop of blood appears.  Follow instructions that come with your meter for inserting the test strip, applying blood to the strip, and using your blood glucose meter.  Write down your result and any notes. Alternative testing sites   Some meters allow you to use areas of your body other than your finger (alternative sites) to test your  blood.  If you think you may have hypoglycemia, or if you have hypoglycemia unawareness, do not use alternative sites. Use your finger instead.  Alternative sites may not be as accurate as the fingers, because blood flow is slower in these areas. This means that the result you get may be delayed, and it may be different from the result that you would get from your finger.  The most common alternative sites are:  Forearm.  Thigh.  Palm of the hand. Additional tips   Always keep your supplies with you.  If you have questions or need help, all blood glucose meters have a 24-hour "hotline" number that you can call. You may also contact your health care provider.  After you use a few boxes of test strips, adjust (calibrate) your blood glucose meter by following instructions that came with your meter. This information is not intended to replace advice given to you by your health care provider. Make sure you discuss any questions you have with your health care provider. Document Released: 09/15/2003 Document Revised: 04/01/2016 Document Reviewed: 02/22/2016 Elsevier Interactive Patient Education  2017 [employer]  

## 2017-01-04 NOTE — Progress Notes (Signed)
Patient is here for FU HTN DM  Patient complains of feeling nauseous. Patient denies pain at this time.  Patient has taken medication today. Patient has not eaten today.  Patient request a refill on Prilosec

## 2017-01-09 ENCOUNTER — Other Ambulatory Visit (INDEPENDENT_AMBULATORY_CARE_PROVIDER_SITE_OTHER): Payer: Medicare Other

## 2017-01-09 ENCOUNTER — Other Ambulatory Visit: Payer: Self-pay | Admitting: *Deleted

## 2017-01-09 ENCOUNTER — Telehealth: Payer: Self-pay | Admitting: Internal Medicine

## 2017-01-09 DIAGNOSIS — G458 Other transient cerebral ischemic attacks and related syndromes: Secondary | ICD-10-CM

## 2017-01-09 DIAGNOSIS — E118 Type 2 diabetes mellitus with unspecified complications: Secondary | ICD-10-CM

## 2017-01-09 DIAGNOSIS — H8110 Benign paroxysmal vertigo, unspecified ear: Secondary | ICD-10-CM

## 2017-01-09 DIAGNOSIS — I10 Essential (primary) hypertension: Secondary | ICD-10-CM | POA: Diagnosis not present

## 2017-01-09 DIAGNOSIS — K581 Irritable bowel syndrome with constipation: Secondary | ICD-10-CM

## 2017-01-09 DIAGNOSIS — K219 Gastro-esophageal reflux disease without esophagitis: Secondary | ICD-10-CM

## 2017-01-09 LAB — BASIC METABOLIC PANEL WITH GFR
BUN: 20 mg/dL (ref 7–25)
CHLORIDE: 104 mmol/L (ref 98–110)
CO2: 26 mmol/L (ref 20–31)
CREATININE: 1.1 mg/dL — AB (ref 0.50–0.99)
Calcium: 9.8 mg/dL (ref 8.6–10.4)
GFR, Est African American: 62 mL/min (ref >=60)
GFR, Est Non African American: 54 mL/min — ABNORMAL LOW (ref >=60)
Glucose, Bld: 92 mg/dL (ref 65–99)
POTASSIUM: 4.4 mmol/L (ref 3.5–5.3)
SODIUM: 139 mmol/L (ref 135–146)

## 2017-01-09 LAB — LIPID PANEL
Cholesterol: 166 mg/dL (ref 0–200)
HDL: 50.6 mg/dL (ref >39.00)
LDL Cholesterol: 95 mg/dL (ref 0–99)
NonHDL: 115.07
Total CHOL/HDL Ratio: 3
Triglycerides: 101 mg/dL (ref 0.0–149.0)
VLDL: 20.2 mg/dL (ref 0.0–40.0)

## 2017-01-09 NOTE — Telephone Encounter (Signed)
MA informed patient of medications being transferred to [facility].  MA confirmed medications have been received. MEDICAID will not authorize paying for the medications due to system stating refills are three days early. Patient is aware of being able to pay out of pocket or waiting the 3 days to obtain the refills with a co-pay. Patient expressed her understanding and had no further questions.

## 2017-01-09 NOTE — Telephone Encounter (Signed)
Pt wants all future scripts to go to [facility] on [address] not [facility] pharmacy. Also requests any refills remaining to be sent to [facility]. Thank you.

## 2017-01-10 ENCOUNTER — Telehealth: Payer: Self-pay

## 2017-01-10 MED ORDER — LINACLOTIDE 145 MCG PO CAPS: 145.0000 ug | ORAL_CAPSULE | Freq: Every day | ORAL | 3 refills | Status: DC | PRN

## 2017-01-10 MED ORDER — TOPIRAMATE 25 MG PO TABS
25.0000 mg | ORAL_TABLET | Freq: Two times a day (BID) | ORAL | 3 refills | Status: DC
Start: 2017-01-10 — End: 2017-06-16

## 2017-01-10 MED ORDER — METFORMIN HCL 500 MG PO TABS: 500.0000 mg | ORAL_TABLET | Freq: Two times a day (BID) | ORAL | 3 refills | Status: DC

## 2017-01-10 MED ORDER — ATORVASTATIN CALCIUM 80 MG PO TABS: 80.0000 mg | ORAL_TABLET | Freq: Every day | ORAL | 3 refills | Status: DC

## 2017-01-10 MED ORDER — PANTOPRAZOLE SODIUM 40 MG PO TBEC: 40.0000 mg | DELAYED_RELEASE_TABLET | Freq: Every day | ORAL | 3 refills | Status: DC

## 2017-01-10 MED ORDER — MECLIZINE HCL 25 MG PO TABS: 25.0000 mg | ORAL_TABLET | Freq: Three times a day (TID) | ORAL | 3 refills | Status: DC | PRN

## 2017-01-10 MED ORDER — LOSARTAN POTASSIUM 25 MG PO TABS: 25.0000 mg | ORAL_TABLET | Freq: Two times a day (BID) | ORAL | 3 refills | Status: DC

## 2017-01-10 NOTE — Telephone Encounter (Signed)
Spoke to patient. Gave lab results. She has been taking Lipitor  since 04/06.

## 2017-01-10 NOTE — Telephone Encounter (Signed)
Medications sent to [facility] on [address].

## 2017-01-10 NOTE — Telephone Encounter (Signed)
-----   Message from Drema Dallas, DO sent at 01/10/2017  7:14 AM EDT ----- Patient's LDL is 95.  The goal is to be less than 70.  Therefore, I would increase Lipitor from  to  daily and recheck fasting lipid panel (with hepatic panel) in 3 months.

## 2017-01-10 NOTE — Telephone Encounter (Signed)
Spoke to patient. Gave instructions per Dr. Jaffe's previous note. Patient verbalized understanding.  

## 2017-01-10 NOTE — Telephone Encounter (Signed)
She is taking Lipitor  daily.  LDL is 95.  Ideally, goal should be less than 70.  I recommend discussing with Dr. Hyman Hopes about any possible changes to optimize statin therapy.  I will route this message as well to Dr. Hyman Hopes, so he is informed.

## 2017-02-01 ENCOUNTER — Ambulatory Visit
Admission: RE | Admit: 2017-02-01 | Discharge: 2017-02-01 | Disposition: A | Discharge status: Home / Self Care | Payer: Medicare Other | Source: Ambulatory Visit | Attending: Internal Medicine | Admitting: Internal Medicine

## 2017-02-01 DIAGNOSIS — Z1231 Encounter for screening mammogram for malignant neoplasm of breast: Secondary | ICD-10-CM | POA: Diagnosis not present

## 2017-02-01 DIAGNOSIS — Z1239 Encounter for other screening for malignant neoplasm of breast: Secondary | ICD-10-CM

## 2017-02-02 ENCOUNTER — Other Ambulatory Visit: Payer: Self-pay | Admitting: Internal Medicine

## 2017-02-02 ENCOUNTER — Ambulatory Visit: Payer: Medicare Other | Admitting: Nurse Practitioner

## 2017-02-02 DIAGNOSIS — R928 Other abnormal and inconclusive findings on diagnostic imaging of breast: Secondary | ICD-10-CM

## 2017-02-03 ENCOUNTER — Encounter: Payer: Self-pay | Admitting: Sports Medicine

## 2017-02-03 ENCOUNTER — Ambulatory Visit: Payer: Self-pay

## 2017-02-03 ENCOUNTER — Ambulatory Visit (INDEPENDENT_AMBULATORY_CARE_PROVIDER_SITE_OTHER): Payer: Medicare Other | Admitting: Sports Medicine

## 2017-02-03 ENCOUNTER — Ambulatory Visit (INDEPENDENT_AMBULATORY_CARE_PROVIDER_SITE_OTHER): Payer: Medicare Other

## 2017-02-03 VITALS — BP 138/88 | HR 83 | Ht 67.0 in | Wt 188.6 lb

## 2017-02-03 DIAGNOSIS — M4726 Other spondylosis with radiculopathy, lumbar region: Secondary | ICD-10-CM

## 2017-02-03 DIAGNOSIS — M79605 Pain in left leg: Secondary | ICD-10-CM | POA: Diagnosis not present

## 2017-02-03 DIAGNOSIS — G8929 Other chronic pain: Secondary | ICD-10-CM

## 2017-02-03 DIAGNOSIS — M79604 Pain in right leg: Secondary | ICD-10-CM | POA: Diagnosis not present

## 2017-02-03 DIAGNOSIS — M479 Spondylosis, unspecified: Secondary | ICD-10-CM | POA: Insufficient documentation

## 2017-02-03 DIAGNOSIS — M545 Low back pain: Secondary | ICD-10-CM | POA: Diagnosis not present

## 2017-02-03 DIAGNOSIS — M25562 Pain in left knee: Secondary | ICD-10-CM | POA: Diagnosis not present

## 2017-02-03 DIAGNOSIS — M25561 Pain in right knee: Secondary | ICD-10-CM

## 2017-02-03 HISTORY — DX: Spondylosis, unspecified: M47.9

## 2017-02-03 MED ORDER — METHYLPREDNISOLONE 4 MG PO TBPK: ORAL_TABLET | ORAL | 0 refills | Status: DC

## 2017-02-03 MED ORDER — GABAPENTIN 300 MG PO CAPS: ORAL_CAPSULE | ORAL | 1 refills | Status: DC

## 2017-02-03 NOTE — Progress Notes (Addendum)
OFFICE VISIT NOTE Julie Gilbert. [facility] at [facility] [phone]  Julie Gilbert - 66 y.o. female MRN [id number]  Date of birth: 07/16/1951  Visit Date: 02/03/2017  PCP: Anne Ng, NP   Referred by: Anne Ng, NP  [name], CMA acting as scribe for Dr. Berline Chough.  SUBJECTIVE:   Chief Complaint  Patient presents with  . pain and swelling in knee   HPI: As below and per problem based documentation when appropriate.  Pt c/o bilateral knee pain and swelling. The pain rotates around the knee, medial and lateral. Pt has had swelling in both knees Pain has been constant for the past 1-2 years.  Pt has fallen down stairs in the past d/t vertigo so that could have caused some issues with the knees. She has fallen twice over the past 2 months.   The pain is described as aching, stabbing, shooting, throbbing and is rated as 10/10.  Worsened with walking, pain is constant no matter what she is doing. Improves with Aleve helps for about 45 minutes. Therapies tried include Aleve, heat, ice.  Other associated symptoms include: Pain radiates down to feet. She has trouble getting started walking after she has been sitting. No recent imaging of the knees, last was around 2015 per pt report.     ROS  Otherwise per HPI.  HISTORY & PERTINENT PRIOR DATA:  No specialty comments available. She reports that she quit smoking about 4 years ago. She has never used smokeless tobacco.   Recent Labs  06/29/16 0705 01/04/17 1011  HGBA1C 6.4* 5.8   Medications & Allergies reviewed per EMR Patient Active Problem List   Diagnosis Date Noted  . Bilateral leg pain 02/03/2017  . Osteoarthritis of spine 02/03/2017  . Moderate episode of recurrent major depressive disorder (HCC) 01/04/2017  . Infective urethritis 01/04/2017  . Colon cancer screening 01/04/2017  . Patient left before evaluation by physician 12/24/2016  . Chronic  anemia 06/29/2016  . TIA (transient ischemic attack) 06/29/2016  . Transient ischemic attack (TIA) 06/28/2016  . Arthralgia 08/12/2015  . Gastroesophageal reflux disease without esophagitis 06/18/2015  . History of migraine headaches 05/13/2015  . Essential hypertension 05/13/2015  . Diabetes mellitus type 2, controlled (HCC) 05/13/2015  . IBS (irritable bowel syndrome) 05/13/2015  . Migraine headache 05/13/2015  . Depression 05/13/2015  . Insomnia 05/13/2015   Past Medical History:  Diagnosis Date  . Asthma   . Diabetes mellitus without complication (HCC)   . Hypertension   . IBS (irritable bowel syndrome)    Family History  Problem Relation Age of Onset  . Cancer Maternal Grandfather   . Kidney failure Mother   . Hypertension Mother   . Heart disease Father   . Breast cancer Neg Hx    Past Surgical History:  Procedure Laterality Date  . CESAREAN SECTION    . EXPLORATORY LAPAROTOMY     Social History   Occupational History  . Not on file.   Social History Main Topics  . Smoking status: Former Smoker    Quit date: 09/22/2012  . Smokeless tobacco: Never Used  . Alcohol use No  . Drug use: No  . Sexual activity: Not on file    OBJECTIVE:  VS:  HT:5\' 7"  (170.2 cm)   WT:188 lb 9.6 oz (85.5 kg)  BMI:29.6    BP:138/88  HR:83bpm  TEMP: ( )  RESP:96 % EXAM: Findings:  WDWN, NAD, Non-toxic appearing  Alert & appropriately interactive Not depressed or anxious appearing No increased work of breathing. Pupils are equal. EOM intact without nystagmus No clubbing or cyanosis of the extremities appreciated No significant rashes/lesions/ulcerations overlying the examined area. DP & PT pulses 2+/4.  No significant pretibial edema.   Sensation intact to light touch in lower extremities.  Back & Lower Extremities: Markedly tight straight leg raise with pain localizes to the back.  Pain with popliteal compression test. No significant midline tenderness.   TTP over  bilateral paraspinal muscles as well as the greater sciatic notch. Good internal and external rotation of the hips. Manual muscle testing is 5+/5 in BLE myotomes without focality Lower extremity DTRs 2+/4 diffusely and symmetric Bilateral knees have no effusion.  She stable to varus and valgus strain.  Anterior posterior drawer stable.  Only minimal pain with McMurray's.      Dg Lumbar Spine 2-3 Views  Result Date: 02/03/2017 CLINICAL DATA:  Low back pain EXAM: LUMBAR SPINE - 2-3 VIEW COMPARISON:  None. FINDINGS: Anatomic alignment. No vertebral compression deformity. Disc height is preserved. Minimal facet arthropathy at L5-S1. Minimal degenerative change of the SI joints. IMPRESSION: No acute bony pathology.  Minimal chronic changes. Electronically Signed   By: Jolaine Click M.D.   On: 02/03/2017 15:44   Mm Digital Screening  Result Date: 02/01/2017 CLINICAL DATA:  Screening. EXAM: DIGITAL SCREENING BILATERAL MAMMOGRAM WITH CAD COMPARISON:  None. ACR Breast Density Category d: The breast tissue is extremely dense, which lowers the sensitivity of mammography. FINDINGS: In the left breast, a possible mass warrants further evaluation. In the right breast, no findings suspicious for malignancy. Images were processed with CAD. IMPRESSION: Further evaluation is suggested for possible mass in the left breast. RECOMMENDATION: Diagnostic mammogram and possibly ultrasound of the left breast. (Code:FI-L-58M) The patient will be contacted regarding the findings, and additional imaging will be scheduled. BI-RADS CATEGORY  0: Incomplete. Need additional imaging evaluation and/or prior mammograms for comparison. Electronically Signed   By: Beckie Salts M.D.   On: 02/01/2017 12:38   US Breast Ltd Uni Left Inc Axilla  Result Date: 02/14/2017 CLINICAL DATA:  Screening recall for possible left breast mass. EXAM: 2D DIGITAL DIAGNOSTIC UNILATERAL LEFT MAMMOGRAM WITH CAD AND ADJUNCT TOMO LEFT BREAST ULTRASOUND  COMPARISON:  Screening mammogram dated 02/01/2017. ACR Breast Density Category c: The breast tissue is heterogeneously dense, which may obscure small masses. FINDINGS: Cc and MLO tomograms were performed of the left breast demonstrating an oval circumscribed mass in the outer left breast measuring approximately 5-6 mm. Mammographic images were processed with CAD. Targeted ultrasound of the outer left breast was performed demonstrating an oval circumscribed mass with an internal echogenic component compatible with a benign intramammary lymph node at 2 o'clock 6 cm from nipple measuring 0.5 x 0.3 x 0.6 cm. This corresponds well with the mass seen at mammography. IMPRESSION: No findings of malignancy in the left breast. RECOMMENDATION: Screening mammogram in one year.(Code:SM-B-01Y) I have discussed the findings and recommendations with the patient. Results were also provided in writing at the conclusion of the visit. If applicable, a reminder letter will be sent to the patient regarding the next appointment. BI-RADS CATEGORY  2: Benign. Electronically Signed   By: Edwin Cap M.D.   On: 02/14/2017 09:25   Mm Diag Breast Tomo Uni Left  Result Date: 02/14/2017 CLINICAL DATA:  Screening recall for possible left breast mass. EXAM: 2D DIGITAL DIAGNOSTIC UNILATERAL LEFT MAMMOGRAM WITH CAD AND ADJUNCT TOMO LEFT BREAST ULTRASOUND  COMPARISON:  Screening mammogram dated 02/01/2017. ACR Breast Density Category c: The breast tissue is heterogeneously dense, which may obscure small masses. FINDINGS: Cc and MLO tomograms were performed of the left breast demonstrating an oval circumscribed mass in the outer left breast measuring approximately 5-6 mm. Mammographic images were processed with CAD. Targeted ultrasound of the outer left breast was performed demonstrating an oval circumscribed mass with an internal echogenic component compatible with a benign intramammary lymph node at 2 o'clock 6 cm from nipple measuring 0.5 x  0.3 x 0.6 cm. This corresponds well with the mass seen at mammography. IMPRESSION: No findings of malignancy in the left breast. RECOMMENDATION: Screening mammogram in one year.(Code:SM-B-01Y) I have discussed the findings and recommendations with the patient. Results were also provided in writing at the conclusion of the visit. If applicable, a reminder letter will be sent to the patient regarding the next appointment. BI-RADS CATEGORY  2: Benign. Electronically Signed   By: Edwin CapJennifer  Jarosz M.D.   On: 02/14/2017 09:25   ASSESSMENT & PLAN:   Problem List Items Addressed This Visit    Bilateral leg pain   Relevant Orders   DG Lumbar Spine 2-3 Views (Completed)   Osteoarthritis of spine    Likely etiology of the bilateral leg pain.  There does not appear to be any significant true knee pathology.  If any lack of improvement can consider further advanced imaging of the lumbar spine.  Will consider referral for physical therapy versus home exercise program if any lack of improvement.  She will follow-up as needed and can consider referral for further advanced imaging versus formal PT.      Relevant Medications   methylPREDNISolone (MEDROL DOSEPAK) 4 MG TBPK tablet   gabapentin (NEURONTIN) 300 MG capsule    Other Visit Diagnoses    Chronic pain of both knees    -  Primary   Relevant Medications   methylPREDNISolone (MEDROL DOSEPAK) 4 MG TBPK tablet   gabapentin (NEURONTIN) 300 MG capsule   Other Relevant Orders   US LIMITED JOINT SPACE STRUCTURES LOW BILAT(NO LINKED CHARGES)      Follow-up: Return in about 6 weeks (around 03/17/2017).   CMA/ATC served as Neurosurgeonscribe during this visit. History, Physical, and Plan performed by medical provider. Documentation and orders reviewed and attested to.      Gaspar BiddingMichael Tayvin Preslar, DO    [facility] Phys[facility]

## 2017-02-06 ENCOUNTER — Other Ambulatory Visit: Payer: Self-pay | Admitting: Internal Medicine

## 2017-02-07 ENCOUNTER — Telehealth: Payer: Self-pay | Admitting: *Deleted

## 2017-02-07 NOTE — Telephone Encounter (Signed)
MA informed patient of a mass being present in the left breast which needs further evaluation. The right breast showed no findings. Patient provided the Breast imaging number [phone] to schedule diagnostic at her convienence. Medical Assistant left message on patient's home and cell voicemail. Voicemail states to give a call back to [place] with [facility] at [phone].

## 2017-02-07 NOTE — Telephone Encounter (Signed)
-----   Message from Quentin Angstlugbemiga E Jegede, MD sent at 02/06/2017  6:17 PM EDT ----- Please inform patient that her mammogram showed: In the left breast, a possible mass which warrants further evaluation. In the right breast, no findings suspicious for malignancy. RECOMMENDATION: Diagnostic mammogram and possibly ultrasound of the left breast, ordered.

## 2017-02-09 DIAGNOSIS — M79671 Pain in right foot: Secondary | ICD-10-CM | POA: Diagnosis not present

## 2017-02-09 DIAGNOSIS — M79672 Pain in left foot: Secondary | ICD-10-CM | POA: Diagnosis not present

## 2017-02-09 DIAGNOSIS — M21961 Unspecified acquired deformity of right lower leg: Secondary | ICD-10-CM | POA: Diagnosis not present

## 2017-02-09 DIAGNOSIS — L03032 Cellulitis of left toe: Secondary | ICD-10-CM | POA: Diagnosis not present

## 2017-02-09 DIAGNOSIS — M21962 Unspecified acquired deformity of left lower leg: Secondary | ICD-10-CM | POA: Diagnosis not present

## 2017-02-14 ENCOUNTER — Ambulatory Visit
Admission: RE | Admit: 2017-02-14 | Discharge: 2017-02-14 | Disposition: A | Discharge status: Home / Self Care | Payer: Medicare Other | Source: Ambulatory Visit | Attending: Internal Medicine | Admitting: Internal Medicine

## 2017-02-14 DIAGNOSIS — R928 Other abnormal and inconclusive findings on diagnostic imaging of breast: Secondary | ICD-10-CM

## 2017-02-14 DIAGNOSIS — N6323 Unspecified lump in the left breast, lower outer quadrant: Secondary | ICD-10-CM | POA: Diagnosis not present

## 2017-02-24 DIAGNOSIS — L03032 Cellulitis of left toe: Secondary | ICD-10-CM | POA: Diagnosis not present

## 2017-02-25 NOTE — Assessment & Plan Note (Addendum)
Likely etiology of the bilateral leg pain.  There does not appear to be any significant true knee pathology.  If any lack of improvement can consider further advanced imaging of the lumbar spine.  Will consider referral for physical therapy versus home exercise program if any lack of improvement.  She will follow-up as needed and can consider referral for further advanced imaging versus formal PT.

## 2017-02-25 NOTE — Addendum Note (Signed)
Addended by: Gaspar BiddingIGBY, Almando Brawley D on: 02/25/2017 07:05 PM   Modules accepted: Level of Service

## 2017-02-28 ENCOUNTER — Other Ambulatory Visit: Payer: Self-pay | Admitting: Internal Medicine

## 2017-02-28 NOTE — Telephone Encounter (Signed)
Patient requesting trazodone refill with a dosage change to 100 instead of 50, due to no relief being provided at the current dosing.

## 2017-02-28 NOTE — Telephone Encounter (Signed)
Patient called requesting medication trazodone 50 mg, pt states she had requested medication during office visit with no response. Patient also stated if possible could dosage be 100mg  instead of 50 due to it not working.

## 2017-03-02 ENCOUNTER — Other Ambulatory Visit: Payer: Self-pay | Admitting: Internal Medicine

## 2017-03-02 MED ORDER — TRAZODONE HCL 100 MG PO TABS: 100.0000 mg | ORAL_TABLET | Freq: Every evening | ORAL | 3 refills | Status: DC | PRN

## 2017-03-02 NOTE — Telephone Encounter (Signed)
Refilled

## 2017-03-03 NOTE — Telephone Encounter (Signed)
-----   Message from Quentin Angstlugbemiga E Jegede, MD sent at 02/14/2017 10:25 AM EDT ----- Please inform patient that her diagnostic mammogram shows no evidence of malignancy. Recommend screening mammogram in one year

## 2017-03-03 NOTE — Telephone Encounter (Signed)
MA informed patient of prescription being changed to 100 mg instead of 50 and patient is aware of mammogram being normal and a recommended screening be completed in one year. Medical Assistant left message on patient's home and cell voicemail. Voicemail states to give a call back to [place] [facility] at [phone].

## 2017-03-14 DIAGNOSIS — L03032 Cellulitis of left toe: Secondary | ICD-10-CM | POA: Diagnosis not present

## 2017-03-16 ENCOUNTER — Telehealth: Payer: Self-pay | Admitting: Nurse Practitioner

## 2017-03-16 ENCOUNTER — Telehealth: Payer: Self-pay | Admitting: Internal Medicine

## 2017-03-16 NOTE — Telephone Encounter (Signed)
Pt. Daughter came to facility to drop off disability parking placard. Form will be put in PCP box. Pt. Daughter ws told that the nurse would give her a call once form has been completed. Please f/u

## 2017-03-17 ENCOUNTER — Ambulatory Visit (INDEPENDENT_AMBULATORY_CARE_PROVIDER_SITE_OTHER): Payer: Medicare Other | Admitting: Sports Medicine

## 2017-03-17 ENCOUNTER — Encounter: Payer: Self-pay | Admitting: Sports Medicine

## 2017-03-17 VITALS — BP 140/86 | HR 68 | Ht 67.0 in | Wt 190.6 lb

## 2017-03-17 DIAGNOSIS — M4726 Other spondylosis with radiculopathy, lumbar region: Secondary | ICD-10-CM | POA: Diagnosis not present

## 2017-03-17 DIAGNOSIS — M79604 Pain in right leg: Secondary | ICD-10-CM | POA: Diagnosis not present

## 2017-03-17 DIAGNOSIS — M79605 Pain in left leg: Secondary | ICD-10-CM | POA: Diagnosis not present

## 2017-03-17 NOTE — Progress Notes (Signed)
OFFICE VISIT NOTE Julie FellsMichael D. Delorise Shinerigby, DO  [facility] [place] [phone]  Julie Gilbert - 66 y.o. female MRN [id number]  Date of birth: [birth year]  Visit Date: 03/17/2017  PCP: Anne NgNche, Charlotte Lum, NP   Referred by: Anne NgNche, Charlotte Lum, NP  Orlie DakinBrandy Shelton, CMA acting as scribe for Dr. Berline Choughigby.  SUBJECTIVE:   Chief Complaint  Patient presents with  . Follow-up    bilateral knee pain and swelling   HPI: As below and per problem based documentation when appropriate.  Pt presents today in follow-up of bilateral knee pain and swelling. At her last visit she had and u/s of her knees. She also had xray of the L-spine. She was prescribed a steroid dosepak and Gabapentin 300 mg.  If lack of improvement with these therapies the plan was to discuss advanced imaging of her lumbar spine and/or referral to PT or home exercises.   Pt reports that she had some relief with the Medrol Dosepak and Gabapentin but the pain is present. She reports that between doses the pain becomes very severe. The pain is still waking he up at night. She reports that pain increases when she tries to walk  For exercise. Pain is rated 7/10 at best and 10+/10 at its worst. The pain seems to be worse in the left than the right. She hasn't noticed if swelling is present in the knees.  She doesn't have much pain when she is at rest. Aside from rest and the medications nothing else seems to touch the pain.   Pt denies fever, chills, night sweats.     Review of Systems  Constitutional: Negative for chills and fever.  Respiratory: Positive for shortness of breath and wheezing.   Cardiovascular: Positive for palpitations. Negative for chest pain.  Musculoskeletal: Positive for joint pain. Negative for falls.  Neurological: Positive for headaches. Negative for dizziness and tingling.  Endo/Heme/Allergies: Does not bruise/bleed easily.    Otherwise per HPI.  HISTORY & PERTINENT PRIOR  DATA:  No specialty comments available. She reports that she quit smoking about 4 years ago. She has never used smokeless tobacco.   Recent Labs  06/29/16 0705 01/04/17 1011  HGBA1C 6.4* 5.8   Medications & Allergies reviewed per EMR Patient Active Problem List   Diagnosis Date Noted  . Bilateral leg pain 02/03/2017  . Osteoarthritis of spine 02/03/2017  . Moderate episode of recurrent major depressive disorder (HCC) 01/04/2017  . Infective urethritis 01/04/2017  . Colon cancer screening 01/04/2017  . Patient left before evaluation by physician 12/24/2016  . Chronic anemia 06/29/2016  . TIA (transient ischemic attack) 06/29/2016  . Transient ischemic attack (TIA) 06/28/2016  . Arthralgia 08/12/2015  . Gastroesophageal reflux disease without esophagitis 06/18/2015  . History of migraine headaches 05/13/2015  . Essential hypertension 05/13/2015  . Diabetes mellitus type 2, controlled (HCC) 05/13/2015  . IBS (irritable bowel syndrome) 05/13/2015  . Migraine headache 05/13/2015  . Depression 05/13/2015  . Insomnia 05/13/2015   Past Medical History:  Diagnosis Date  . Asthma   . Diabetes mellitus without complication (HCC)   . Hypertension   . IBS (irritable bowel syndrome)    Family History  Problem Relation Age of Onset  . Cancer Maternal Grandfather   . Kidney failure Mother   . Hypertension Mother   . Heart disease Father   . Breast cancer Neg Hx    Past Surgical History:  Procedure Laterality Date  . CESAREAN SECTION    .  EXPLORATORY LAPAROTOMY     Social History   Occupational History  . Not on file.   Social History Main Topics  . Smoking status: Former Smoker    Quit date: 09/22/2012  . Smokeless tobacco: Never Used  . Alcohol use No  . Drug use: No  . Sexual activity: Not on file    OBJECTIVE:  VS:  HT:5\' 7"  (170.2 cm)   WT:190 lb 9.6 oz (86.5 kg)  BMI:29.9    BP:140/86  HR:68bpm  TEMP: ( )  RESP:96 % EXAM: Findings:  WDWN, NAD, Non-toxic  appearing Alert & appropriately interactive Not depressed or anxious appearing No increased work of breathing. Pupils are equal. EOM intact without nystagmus No clubbing or cyanosis of the extremities appreciated No significant rashes/lesions/ulcerations overlying the examined area. DP & PT pulses 2+/4.  No significant pretibial edema. Sensation intact to light touch in lower extremities.  However she reports nondermatomal generalized dysesthesia in the left lower extremity. Back & Lower Extremities: Positive straight leg raise on the left radiating to the left back. No significant midline tenderness.   Mild pain over the left popliteal space.  No focal joint line tenderness over the left knee. Good internal and external rotation of the hips. Manual muscle testing is 5+/5 in BLE myotomes without focality Moderate TTP over the left great toe from prior medial wedge excision of her toenail.  This is well-healed with no significant swelling or erythema.      No results found. ASSESSMENT & PLAN:   Problem List Items Addressed This Visit    Bilateral leg pain   Osteoarthritis of spine    Patient does have changes on her x-ray consistent with lumbar spondylosis and suspected underlying left radiculitis given the only slight improvement with gabapentin and Medrol Dosepak.  We will plan to set her up with an MRI since she is failed conservative measures including home exercise program, medications.  She has no red flag symptoms on exam but is continuing to have significant debilitating pain and significant disruptions in her sleep.  Next  She will follow-up with me to discuss the results of the MRI and will plan to refer her to Dr. Alvester Morin for epidural steroid injections depending on the results of the MRI.       Other Visit Diagnoses    Left leg pain    -  Primary   Relevant Orders   MR Lumbar Spine Wo Contrast      Follow-up: Return for after MRI.   CMA/ATC served as Neurosurgeon during  this visit. History, Physical, and Plan performed by medical provider. Documentation and orders reviewed and attested to.      Gaspar Bidding, DO    [facility]

## 2017-03-17 NOTE — Assessment & Plan Note (Signed)
Patient does have changes on her x-ray consistent with lumbar spondylosis and suspected underlying left radiculitis given the only slight improvement with gabapentin and Medrol Dosepak.  We will plan to set her up with an MRI since she is failed conservative measures including home exercise program, medications.  She has no red flag symptoms on exam but is continuing to have significant debilitating pain and significant disruptions in her sleep.  Next  She will follow-up with me to discuss the results of the MRI and will plan to refer her to Dr. Alvester MorinNewton for epidural steroid injections depending on the results of the MRI.

## 2017-03-17 NOTE — Patient Instructions (Signed)

## 2017-03-30 ENCOUNTER — Encounter: Payer: Self-pay | Admitting: Occupational Therapy

## 2017-03-30 NOTE — Therapy (Signed)
[facility] [address] [place], [place], [address] Phone: [phone]   Fax:  [phone]  March 30, 2017    @[email or web address] @  Occupational Therapy Discharge Summary   Patient: Julie Gilbert Finkel MRN: [id number] Date of Birth: 1951/05/21  Diagnosis: No diagnosis found.  Referring Provider: Julianne HandlerLachina Hollis, FNP; Dr Bennett ScrapeAlex Ukleja  The above patient had been seen in Occupational Therapy 1 time.  Subsequent session cancelled due to elevated BP.    The treatment consisted of OT Evaluation      Sincerely,  Polk Minor, Lanice ShirtsKristin M, OT    [facility] [address] [place], [place], [address] Phone: [phone]   Fax:  [phone]  Patient: Julie Gilbert Allen MRN: [id number] Date of Birth: 1951/05/21

## 2017-03-31 ENCOUNTER — Other Ambulatory Visit: Payer: Self-pay | Admitting: Pharmacist

## 2017-03-31 MED ORDER — TRAZODONE HCL 100 MG PO TABS: 100.0000 mg | ORAL_TABLET | Freq: Every evening | ORAL | 0 refills | Status: DC | PRN

## 2017-04-05 DIAGNOSIS — J45909 Unspecified asthma, uncomplicated: Secondary | ICD-10-CM | POA: Diagnosis not present

## 2017-04-05 DIAGNOSIS — E785 Hyperlipidemia, unspecified: Secondary | ICD-10-CM | POA: Insufficient documentation

## 2017-04-05 DIAGNOSIS — J0100 Acute maxillary sinusitis, unspecified: Secondary | ICD-10-CM | POA: Diagnosis not present

## 2017-04-05 DIAGNOSIS — E119 Type 2 diabetes mellitus without complications: Secondary | ICD-10-CM | POA: Insufficient documentation

## 2017-04-05 DIAGNOSIS — I10 Essential (primary) hypertension: Secondary | ICD-10-CM | POA: Diagnosis not present

## 2017-04-07 ENCOUNTER — Ambulatory Visit
Admission: RE | Admit: 2017-04-07 | Discharge: 2017-04-07 | Disposition: A | Discharge status: Home / Self Care | Payer: Medicare Other | Source: Ambulatory Visit | Attending: Sports Medicine | Admitting: Sports Medicine

## 2017-04-07 DIAGNOSIS — M5126 Other intervertebral disc displacement, lumbar region: Secondary | ICD-10-CM | POA: Diagnosis not present

## 2017-04-07 DIAGNOSIS — M79605 Pain in left leg: Secondary | ICD-10-CM

## 2017-04-12 ENCOUNTER — Encounter: Payer: Self-pay | Admitting: Internal Medicine

## 2017-04-12 ENCOUNTER — Other Ambulatory Visit: Payer: Self-pay | Admitting: *Deleted

## 2017-04-12 ENCOUNTER — Ambulatory Visit: Payer: Medicare Other | Attending: Internal Medicine | Admitting: Internal Medicine

## 2017-04-12 VITALS — BP 144/100 | HR 62 | Temp 98.7°F | Resp 18 | Ht 67.0 in | Wt 191.0 lb

## 2017-04-12 DIAGNOSIS — I10 Essential (primary) hypertension: Secondary | ICD-10-CM | POA: Diagnosis not present

## 2017-04-12 DIAGNOSIS — M4726 Other spondylosis with radiculopathy, lumbar region: Secondary | ICD-10-CM

## 2017-04-12 DIAGNOSIS — J45909 Unspecified asthma, uncomplicated: Secondary | ICD-10-CM | POA: Insufficient documentation

## 2017-04-12 DIAGNOSIS — Z91010 Allergy to peanuts: Secondary | ICD-10-CM | POA: Insufficient documentation

## 2017-04-12 DIAGNOSIS — Z7982 Long term (current) use of aspirin: Secondary | ICD-10-CM | POA: Insufficient documentation

## 2017-04-12 DIAGNOSIS — J069 Acute upper respiratory infection, unspecified: Secondary | ICD-10-CM | POA: Insufficient documentation

## 2017-04-12 DIAGNOSIS — Z91040 Latex allergy status: Secondary | ICD-10-CM | POA: Insufficient documentation

## 2017-04-12 DIAGNOSIS — K589 Irritable bowel syndrome without diarrhea: Secondary | ICD-10-CM | POA: Diagnosis not present

## 2017-04-12 DIAGNOSIS — Z7984 Long term (current) use of oral hypoglycemic drugs: Secondary | ICD-10-CM | POA: Insufficient documentation

## 2017-04-12 DIAGNOSIS — Z91013 Allergy to seafood: Secondary | ICD-10-CM | POA: Insufficient documentation

## 2017-04-12 DIAGNOSIS — E118 Type 2 diabetes mellitus with unspecified complications: Secondary | ICD-10-CM | POA: Diagnosis not present

## 2017-04-12 LAB — GLUCOSE, POCT (MANUAL RESULT ENTRY): POC Glucose: 110 mg/dl — AB (ref 70–99)

## 2017-04-12 MED ORDER — GUAIFENESIN-CODEINE 100-10 MG/5ML PO SYRP: 10.0000 mL | ORAL_SOLUTION | Freq: Three times a day (TID) | ORAL | 1 refills | Status: DC | PRN

## 2017-04-12 MED ORDER — GABAPENTIN 300 MG PO CAPS: ORAL_CAPSULE | ORAL | 1 refills | Status: DC

## 2017-04-12 MED ORDER — FLUTICASONE PROPIONATE 50 MCG/ACT NA SUSP: 2.0000 | Freq: Every day | NASAL | 3 refills | Status: DC

## 2017-04-12 NOTE — Progress Notes (Signed)
Patient has not eaten Patient has had her medications  Patient A1C 6.4

## 2017-04-12 NOTE — Progress Notes (Signed)
Julie Gilbert, is a 66 y.o. female  ZOX:[id number]  [id number]:[id number]  DOB - 10-24-1950  Chief Complaint  Patient presents with  . Follow-up      Subjective:   Julie Gilbert is a 66 y.o. female with history of hypertension, type 2 diabetes mellitus, IBS and Asthma here today for urgent care visit and medication refill (Gabapentin). Patient complaint of cough and running nose, recently completed a course of antibiotics for the same complaint. Her daughter is also down with same symptoms. She claims compliance with her medications for diabetes and HTN. Patient has no chest pain, no SOB, no fever. Patient has No headache,  No abdominal pain - No Nausea, No new weakness tingling or numbness. She requests refill of her Gabapentin.  ALLERGIES: Allergies  Allergen Reactions  . Peanuts [Peanut Oil] Anaphylaxis  . Shrimp [Shellfish Allergy] Anaphylaxis  . Latex Hives    PAST MEDICAL HISTORY: Past Medical History:  Diagnosis Date  . Asthma   . Diabetes mellitus without complication (HCC)   . Hypertension   . IBS (irritable bowel syndrome)     MEDICATIONS AT HOME: Prior to Admission medications   Medication Sig Start Date End Date Taking? Authorizing Provider  aspirin 81 MG tablet Take 81 mg by mouth daily.    [provider]  atorvastatin (LIPITOR) 80 MG tablet Take 1 tablet (80 mg total) by mouth daily. 01/10/17   Quentin AngstJegede, Hodari Chuba E, MD  Fish Oil-Cholecalciferol (FISH OIL + D3) 1000-1000 MG-UNIT CAPS Take 1 tablet by mouth daily.    [provider]  gabapentin (NEURONTIN) 300 MG capsule Start with 1 tab po qhs X 1 week, then increase to 1 tab po bid X 1 week then 1 tab po tid prn 04/12/17   Carissa Musick E, MD  guaiFENesin-codeine (ROBITUSSIN AC) 100-10 MG/5ML syrup Take 10 mLs by mouth 3 (three) times daily as needed for cough. 04/12/17   Quentin AngstJegede, Delsie Amador E, MD  linaclotide (LINZESS) 145 MCG CAPS capsule Take 1 capsule (145 mcg total) by mouth daily as needed  (FOR IBS). 01/10/17   Quentin AngstJegede, Estephania Licciardi E, MD  losartan (COZAAR) 25 MG tablet Take 1 tablet (25 mg total) by mouth 2 (two) times daily. 01/10/17   Quentin AngstJegede, Shallen Luedke E, MD  meclizine (ANTIVERT) 25 MG tablet Take 1 tablet (25 mg total) by mouth every 8 (eight) hours as needed for dizziness. 01/10/17   Quentin AngstJegede, Jackie Littlejohn E, MD  metFORMIN (GLUCOPHAGE) 500 MG tablet Take 1 tablet (500 mg total) by mouth 2 (two) times daily with a meal. 01/10/17   Jermya Dowding E, MD  ondansetron (ZOFRAN ODT) 4 MG disintegrating tablet Take 1 tablet (4 mg total) by mouth every 8 (eight) hours as needed for nausea or vomiting. 08/04/16   Quentin AngstJegede, Shelisa Fern E, MD  pantoprazole (PROTONIX) 40 MG tablet Take 1 tablet (40 mg total) by mouth daily. 01/10/17   Quentin AngstJegede, Lutisha Knoche E, MD  topiramate (TOPAMAX) 25 MG tablet Take 1 tablet (25 mg total) by mouth 2 (two) times daily. 01/10/17   Quentin AngstJegede, Daisee Centner E, MD  traZODone (DESYREL) 100 MG tablet Take 1 tablet (100 mg total) by mouth at bedtime as needed for sleep. 03/31/17   Quentin AngstJegede, Wilhelmine Krogstad E, MD    Objective:   Vitals:   04/12/17 0955  BP: (!) 144/100  Pulse: 62  Resp: 18  Temp: 98.7 F (37.1 C)  TempSrc: Oral  SpO2: 100%  Weight: 191 lb (86.6 kg)  Height: 5\' 7"  (1.702 m)   Exam General appearance :  Awake, alert, not in any distress. Speech Clear. Not toxic looking HEENT: Atraumatic and Normocephalic, pupils equally reactive to light and accomodation Neck: Supple, no JVD. No cervical lymphadenopathy.  Chest: Good air entry bilaterally, no added sounds  CVS: S1 S2 regular, no murmurs.  Abdomen: Bowel sounds present, Non tender and not distended with no gaurding, rigidity or rebound. Extremities: B/L Lower Ext shows no edema, both legs are warm to touch Neurology: Awake alert, and oriented X 3, CN II-XII intact, Non focal Skin: No Rash  Data Review Lab Results  Component Value Date   HGBA1C 5.8 01/04/2017   HGBA1C 6.4 (H) 06/29/2016   HGBA1C 6.3 (H)  12/14/2015   Assessment & Plan   1. Controlled type 2 diabetes mellitus with complication, without long-term current use of insulin (HCC)  - Glucose (CBG)  Aim for 30 minutes of exercise most days. Rethink what you drink. Water is great! Aim for 2-3 Carb Choices per meal (30-45 grams) +/- 1 either way  Aim for 0-15 Carbs per snack if hungry  Include protein in moderation with your meals and snacks  Consider reading food labels for Total Carbohydrate and Fat Grams of foods  Consider checking BG at alternate times per day  Continue taking medication as directed Be mindful about how much sugar you are adding to beverages and other foods. Fruit Punch - find one with no sugar  Measure and decrease portions of carbohydrate foods  Make your plate and don't go back for seconds  2. Osteoarthritis of spine with radiculopathy, lumbar region  - gabapentin (NEURONTIN) 300 MG capsule; Start with 1 tab po qhs X 1 week, then increase to 1 tab po bid X 1 week then 1 tab po tid prn  Dispense: 90 capsule; Refill: 1  3. Essential hypertension  We have discussed target BP range and blood pressure goal. I have advised patient to check BP regularly and to call us back or report to clinic if the numbers are consistently higher than 140/90. We discussed the importance of compliance with medical therapy and DASH diet recommended, consequences of uncontrolled hypertension discussed.  - continue current BP medications  4. Upper respiratory infection, acute  - guaiFENesin-codeine (ROBITUSSIN AC) 100-10 MG/5ML syrup; Take 10 mLs by mouth 3 (three) times daily as needed for cough.  Dispense: 480 mL; Refill: 1  Patient have been counseled extensively about nutrition and exercise. Other issues discussed during this visit include: low cholesterol diet, weight control and daily exercise, foot care, annual eye examinations at Ophthalmology, importance of adherence with medications and regular follow-up. We also  discussed long term complications of uncontrolled diabetes and hypertension.   Return in about 3 months (around 07/13/2017) for Follow up HTN, Hemoglobin A1C and Follow up, DM.  The patient was given clear instructions to go to ER or return to medical center if symptoms don't improve, worsen or new problems develop. The patient verbalized understanding. The patient was told to call to get lab results if they haven't heard anything in the next week.   This note has been created with Education officer, environmental. Any transcriptional errors are unintentional.    Jeanann Lewandowsky, MD, MHA, Maxwell Caul, CPE [facility] and [facility] [place], [place] [phone]   04/12/2017, 10:42 AM

## 2017-04-12 NOTE — Telephone Encounter (Signed)
Patient verified DOB Script resent to the pharmacy.

## 2017-04-12 NOTE — Patient Instructions (Signed)
Upper Respiratory Infection, Adult Most upper respiratory infections (URIs) are caused by a virus. A URI affects the nose, throat, and upper air passages. The most common type of URI is often called "the common cold." Follow these instructions at home:  Take medicines only as told by your doctor.  Gargle warm saltwater or take cough drops to comfort your throat as told by your doctor.  Use a warm mist humidifier or inhale steam from a shower to increase air moisture. This may make it easier to breathe.  Drink enough fluid to keep your pee (urine) clear or pale yellow.  Eat soups and other clear broths.  Have a healthy diet.  Rest as needed.  Go back to work when your fever is gone or your doctor says it is okay. ? You may need to stay home longer to avoid giving your URI to others. ? You can also wear a face mask and wash your hands often to prevent spread of the virus.  Use your inhaler more if you have asthma.  Do not use any tobacco products, including cigarettes, chewing tobacco, or electronic cigarettes. If you need help quitting, ask your doctor. Contact a doctor if:  You are getting worse, not better.  Your symptoms are not helped by medicine.  You have chills.  You are getting more short of breath.  You have brown or red mucus.  You have yellow or brown discharge from your nose.  You have pain in your face, especially when you bend forward.  You have a fever.  You have puffy (swollen) neck glands.  You have pain while swallowing.  You have white areas in the back of your throat. Get help right away if:  You have very bad or constant: ? Headache. ? Ear pain. ? Pain in your forehead, behind your eyes, and over your cheekbones (sinus pain). ? Chest pain.  You have long-lasting (chronic) lung disease and any of the following: ? Wheezing. ? Long-lasting cough. ? Coughing up blood. ? A change in your usual mucus.  You have a stiff neck.  You have  changes in your: ? Vision. ? Hearing. ? Thinking. ? Mood. This information is not intended to replace advice given to you by your health care provider. Make sure you discuss any questions you have with your health care provider. Document Released: 02/29/2008 Document Revised: 05/15/2016 Document Reviewed: 12/18/2013 Elsevier Interactive Patient Education  2018 Elsevier Inc.  

## 2017-04-20 ENCOUNTER — Telehealth: Payer: Self-pay | Admitting: Internal Medicine

## 2017-04-20 DIAGNOSIS — J069 Acute upper respiratory infection, unspecified: Secondary | ICD-10-CM

## 2017-04-20 DIAGNOSIS — M4726 Other spondylosis with radiculopathy, lumbar region: Secondary | ICD-10-CM

## 2017-04-20 MED ORDER — FLUTICASONE PROPIONATE 50 MCG/ACT NA SUSP: 2.0000 | Freq: Every day | NASAL | 3 refills | Status: DC

## 2017-04-20 MED ORDER — GABAPENTIN 300 MG PO CAPS: ORAL_CAPSULE | ORAL | 1 refills | Status: DC

## 2017-04-20 NOTE — Telephone Encounter (Signed)
Patient verified DOB Patient requested gabapentin and nasal spray [facility] to [facility]. Patient also mentioned bringing loan forgiveness paperwork to the office to be completed. No further questions at this time.

## 2017-04-20 NOTE — Telephone Encounter (Signed)
Patient received form in hand.

## 2017-04-20 NOTE — Telephone Encounter (Signed)
Pt. Called requesting to speak with her nurse regarding her medication. Pt. States that on her last OV she had requesting for [facility] on [address] Pt. Would like [facility] and would like a call from her nurse. Please f/u with pt.

## 2017-04-24 ENCOUNTER — Telehealth: Payer: Self-pay | Admitting: Sports Medicine

## 2017-04-24 NOTE — Telephone Encounter (Signed)
Patient calling for MRI results.  Thank you,  -LL

## 2017-04-24 NOTE — Telephone Encounter (Signed)
Patient is scheduled for 05-01-2017 to review MRI.

## 2017-05-01 ENCOUNTER — Ambulatory Visit (INDEPENDENT_AMBULATORY_CARE_PROVIDER_SITE_OTHER): Payer: Medicare Other | Admitting: Sports Medicine

## 2017-05-01 DIAGNOSIS — M5126 Other intervertebral disc displacement, lumbar region: Secondary | ICD-10-CM

## 2017-05-01 HISTORY — DX: Other intervertebral disc displacement, lumbar region: M51.26

## 2017-05-01 NOTE — Assessment & Plan Note (Signed)
[facility].   Titrate Gabapentin to 900mg  qhs + 300mg  po q am 6 week f/u

## 2017-05-01 NOTE — Progress Notes (Signed)
OFFICE VISIT NOTE Julie FellsMichael D. Delorise Shinerigby, DO  [facility] [place] [phone]  Julie Gilbert - 66 y.o. female MRN [id number]  Date of birth: 10-Apr-1951  Visit Date: 05/01/2017  PCP: Julie Gilbert   Referred by: Julie Gilbert  Julie CaoAutumn Gilbert, cma acting as scribe for Dr. Berline Gilbert.  SUBJECTIVE:   Chief Complaint  Patient presents with  . Review MRI L Spine   HPI: As below and per problem based documentation when appropriate.   Julie Gilbert presents with persistent, ongoing bilateral leg pain. She had an MRI L-Spine on 7/13/218 and is here to discuss results.     Review of Systems  Constitutional: Negative for chills, diaphoresis, fever, malaise/fatigue and weight loss.  HENT: Negative.   Eyes: Negative.   Respiratory: Negative.   Cardiovascular: Negative.   Gastrointestinal: Negative.   Genitourinary: Negative.   Musculoskeletal: Positive for joint pain and myalgias. Negative for back pain, falls and neck pain.  Skin: Negative for itching and rash.  Neurological: Negative.  Negative for weakness.  Endo/Heme/Allergies: Negative.   Psychiatric/Behavioral: Negative.     Otherwise per HPI.  HISTORY & PERTINENT PRIOR DATA:  No specialty comments available. She reports that she quit smoking about 4 years ago. She has never used smokeless tobacco.   Recent Labs  06/29/16 0705 01/04/17 1011  HGBA1C 6.4* 5.8   Medications & Allergies reviewed per EMR Patient Active Problem List   Diagnosis Date Noted  . Herniated intervertebral disc of lumbar spine 05/01/2017  . Bilateral leg pain 02/03/2017  . Osteoarthritis of spine 02/03/2017  . Moderate episode of recurrent major depressive disorder (HCC) 01/04/2017  . Infective urethritis 01/04/2017  . Colon cancer screening 01/04/2017  . Patient left before evaluation by physician 12/24/2016  . Chronic anemia 06/29/2016  . TIA (transient ischemic attack) 06/29/2016  . Transient  ischemic attack (TIA) 06/28/2016  . Arthralgia 08/12/2015  . Gastroesophageal reflux disease without esophagitis 06/18/2015  . History of migraine headaches 05/13/2015  . Essential hypertension 05/13/2015  . Diabetes mellitus type 2, controlled (HCC) 05/13/2015  . IBS (irritable bowel syndrome) 05/13/2015  . Migraine headache 05/13/2015  . Depression 05/13/2015  . Insomnia 05/13/2015   Past Medical History:  Diagnosis Date  . Asthma   . Diabetes mellitus without complication (HCC)   . Hypertension   . IBS (irritable bowel syndrome)    Family History  Problem Relation Age of Onset  . Cancer Maternal Grandfather   . Kidney failure Mother   . Hypertension Mother   . Heart disease Father   . Breast cancer Neg Hx    Past Surgical History:  Procedure Laterality Date  . CESAREAN SECTION    . EXPLORATORY LAPAROTOMY     Social History   Occupational History  . Not on file.   Social History Main Topics  . Smoking status: Former Smoker    Quit date: 09/22/2012  . Smokeless tobacco: Never Used  . Alcohol use No  . Drug use: No  . Sexual activity: Not on file    OBJECTIVE:  VS:  HT:5\' 7"  (170.2 cm)   WT:194 lb 3.2 oz (88.1 kg)  BMI:30.5    BP:(!) 160/100  HR:73bpm  TEMP: ( )  RESP:97 % EXAM: Findings:  Adult female.  No acute distress.  Alert and appropriate.  She has marked pain with straight leg raise on the left.  Lower extremity sensation is slightly diminished in the L4 dermatome on the  left.  Her hip flexor strength on the left is slightly diminished compared to the right but this is minimal.  Lower extremity reflexes are symmetric.  Lower extremities are mildly swollen but this is minimal.     ASSESSMENT & PLAN:     ICD-10-CM   1. Herniated intervertebral disc of lumbar spine M51.26 Ambulatory referral to Physical Medicine Rehab  ================================================================= Herniated intervertebral disc of lumbar spine Refer to [facility] for  ESI at L3/4 left.   Titrate Gabapentin to 900mg  qhs + 300mg  po q am 6 week f/u =================================================================   Follow-up: Return in about 6 weeks (around 06/12/2017).   CMA/ATC served as Neurosurgeon during this visit. History, Physical, and Plan performed by medical provider. Documentation and orders reviewed and attested to.      Gaspar Bidding, DO    Julie Gilbert Sports Medicine Physician

## 2017-05-02 ENCOUNTER — Telehealth: Payer: Self-pay | Admitting: *Deleted

## 2017-05-02 NOTE — Telephone Encounter (Signed)
Patient verified DOB Patient is aware of paperwork being placed

## 2017-05-17 ENCOUNTER — Ambulatory Visit (INDEPENDENT_AMBULATORY_CARE_PROVIDER_SITE_OTHER): Payer: Medicare Other | Admitting: Physical Medicine and Rehabilitation

## 2017-05-17 ENCOUNTER — Encounter (INDEPENDENT_AMBULATORY_CARE_PROVIDER_SITE_OTHER): Payer: Self-pay | Admitting: Physical Medicine and Rehabilitation

## 2017-05-17 ENCOUNTER — Ambulatory Visit (INDEPENDENT_AMBULATORY_CARE_PROVIDER_SITE_OTHER): Payer: Medicare Other

## 2017-05-17 VITALS — BP 149/100 | HR 75

## 2017-05-17 DIAGNOSIS — M5116 Intervertebral disc disorders with radiculopathy, lumbar region: Secondary | ICD-10-CM

## 2017-05-17 DIAGNOSIS — M5416 Radiculopathy, lumbar region: Secondary | ICD-10-CM

## 2017-05-17 MED ORDER — LIDOCAINE HCL (PF) 1 % IJ SOLN
2.0000 mL | Freq: Once | INTRAMUSCULAR | Status: AC
  Administered 2017-05-17: 2 mL

## 2017-05-17 MED ORDER — BETAMETHASONE SOD PHOS & ACET 6 (3-3) MG/ML IJ SUSP
12.0000 mg | Freq: Once | INTRAMUSCULAR | Status: AC
  Administered 2017-05-17: 12 mg

## 2017-05-17 NOTE — Patient Instructions (Signed)

## 2017-05-17 NOTE — Progress Notes (Deleted)
Pain across lower back. Worse on left side. Radiates down left leg to foot.Constant pain but pain level varies. Unable to sleep due to the pain. Numbness and tingling down leg. Leg gives way at times. Taking Gabapentin.

## 2017-05-19 NOTE — Procedures (Signed)
Julie Gilbert is a 66 year old female who is accompanied by her daughter who provides most of the answers to questions to her mother. She is followed by Dr. Berline Chough. They both for more efficient been having worsening excruciating pain down the left leg to the foot. The pattern seems to be clearly more L4 distribution. She does have MRI evidence of disc herniation at L3-4 probably impacting the L4 nerve root in the lateral recess. She is felt conservative care otherwise through Dr. Berline Chough. We will complete a diagnostic of therapeutic left L4-5 intralaminar epidural steroid injection. The patient was quite anxious about the procedure but did well.  Lumbar Epidural Steroid Injection - Interlaminar Approach with Fluoroscopic Guidance  Patient: Julie Gilbert      Date of Birth: Feb 28, 1951 MRN: [id number] PCP: Quentin Angst, MD      Visit Date: 05/17/2017   Universal Protocol:     Consent Given By: the patient  Position: PRONE  Additional Comments: Vital signs were monitored before and after the procedure. Patient was prepped and draped in the usual sterile fashion. The correct patient, procedure, and site was verified.   Injection Procedure Details:  Procedure Site One Meds Administered:  Meds ordered this encounter  Medications  . lidocaine (PF) (XYLOCAINE) 1 % injection 2 mL  . betamethasone acetate-betamethasone sodium phosphate (CELESTONE) injection 12 mg     Laterality: Right  Location/Site:  L4-L5  Needle size: 20 G  Needle type: Tuohy  Needle Placement: Paramedian epidural  Findings:  -Contrast Used: 1 mL iohexol 180 mg iodine/mL   -Comments: Excellent flow of contrast into the epidural space.  Procedure Details: Using a paramedian approach from the side mentioned above, the region overlying the inferior lamina was localized under fluoroscopic visualization and the soft tissues overlying this structure were infiltrated with 4 ml. of 1% Lidocaine without  Epinephrine. The Tuohy needle was inserted into the epidural space using a paramedian approach.   The epidural space was localized using loss of resistance along with lateral and bi-planar fluoroscopic views.  After negative aspirate for air, blood, and CSF, a 2 ml. volume of Isovue-250 was injected into the epidural space and the flow of contrast was observed. Radiographs were obtained for documentation purposes.    The injectate was administered into the level noted above.   Additional Comments:  The patient tolerated the procedure well Dressing: Band-Aid    Post-procedure details: Patient was observed during the procedure. Post-procedure instructions were reviewed.  Patient left the clinic in stable condition.

## 2017-05-30 ENCOUNTER — Ambulatory Visit: Payer: Medicare Other | Admitting: Neurology

## 2017-06-07 ENCOUNTER — Ambulatory Visit: Payer: Medicare Other | Admitting: Neurology

## 2017-06-16 ENCOUNTER — Encounter: Payer: Self-pay | Admitting: Neurology

## 2017-06-16 ENCOUNTER — Telehealth: Payer: Self-pay | Admitting: Internal Medicine

## 2017-06-16 ENCOUNTER — Ambulatory Visit (INDEPENDENT_AMBULATORY_CARE_PROVIDER_SITE_OTHER): Payer: Medicare Other | Admitting: Neurology

## 2017-06-16 VITALS — BP 158/104 | HR 68 | Ht 67.0 in | Wt 192.0 lb

## 2017-06-16 DIAGNOSIS — G459 Transient cerebral ischemic attack, unspecified: Secondary | ICD-10-CM

## 2017-06-16 DIAGNOSIS — Z794 Long term (current) use of insulin: Secondary | ICD-10-CM | POA: Diagnosis not present

## 2017-06-16 DIAGNOSIS — E119 Type 2 diabetes mellitus without complications: Secondary | ICD-10-CM | POA: Diagnosis not present

## 2017-06-16 DIAGNOSIS — I10 Essential (primary) hypertension: Secondary | ICD-10-CM | POA: Diagnosis not present

## 2017-06-16 DIAGNOSIS — G43009 Migraine without aura, not intractable, without status migrainosus: Secondary | ICD-10-CM

## 2017-06-16 MED ORDER — TOPIRAMATE 50 MG PO TABS: 50.0000 mg | ORAL_TABLET | Freq: Two times a day (BID) | ORAL | 5 refills | Status: DC

## 2017-06-16 NOTE — Patient Instructions (Addendum)
1.  We will increase topiramate to  twice daily.  I sent in a prescription for  pills. 2.  Call your PCP's office after leaving our office to tell them your blood pressure has been elevated (158/104 today.  Last month, it was 160/100). 3.  Continue aspirin  daily.  Continue atorvastatin.  We will check lipid panel  You provider has requested that you have labwork done.  Unfortunately our lab is closed today.  Please come back next week for this.  Our lab is located within [facility] in [address] of this building.  We do ask that you come to our front desk, [address], to check in.  Please fast for this lab work.  Thank you!  4.  Limit use of pain relievers to no more than 2 days out of the week to prevent rebound headache 5.  Limit use of caffeine.  Stop coffee.  Instead drink tea (green tea).  You may drink a cup of coffee when you get a migraine (limited to no more than 2 days a week). 6.  Follow up in 5 months.

## 2017-06-16 NOTE — Telephone Encounter (Signed)
Patients neurologist states the HTN medication must be lowered due to risk of stroke

## 2017-06-16 NOTE — Telephone Encounter (Signed)
Patient's daughter called requesting to speak to PCP's nurse. Pt was seen at her neurologist and needs HTN medication switched to lower the risk of having a stroke. Please f/up
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
## 2017-06-18 NOTE — Telephone Encounter (Signed)
I think they meant her BP medicine should be optimized to lower her risk of Stroke. BP was high at Neurology office. Please schedule patient to see Misty Stanley for BP check. Thanks

## 2017-06-19 NOTE — Telephone Encounter (Signed)
Please schedule the patient for a BP check and mediation management with stacey.

## 2017-06-20 ENCOUNTER — Ambulatory Visit: Payer: Medicare Other | Attending: Internal Medicine | Admitting: Pharmacist

## 2017-06-20 DIAGNOSIS — I10 Essential (primary) hypertension: Secondary | ICD-10-CM

## 2017-06-20 DIAGNOSIS — R600 Localized edema: Secondary | ICD-10-CM | POA: Insufficient documentation

## 2017-06-20 DIAGNOSIS — G43909 Migraine, unspecified, not intractable, without status migrainosus: Secondary | ICD-10-CM | POA: Diagnosis not present

## 2017-06-20 MED ORDER — ACCU-CHEK AVIVA PLUS W/DEVICE KIT: PACK | 0 refills | Status: AC

## 2017-06-20 MED ORDER — GLUCOSE BLOOD VI STRP
ORAL_STRIP | 12 refills | Status: DC
Start: 2017-06-20 — End: 2023-12-14

## 2017-06-20 MED ORDER — LOSARTAN POTASSIUM 50 MG PO TABS: 50.0000 mg | ORAL_TABLET | Freq: Two times a day (BID) | ORAL | 2 refills | Status: DC

## 2017-06-20 MED ORDER — ACCU-CHEK SOFT TOUCH LANCETS MISC: 12 refills | Status: DC

## 2017-06-20 MED ORDER — BLOOD PRESSURE KIT DEVI: 1.0000 "application " | Freq: Every day | 0 refills | Status: AC

## 2017-06-20 NOTE — Progress Notes (Signed)
   S:    Patient presents to the clinic for hypertension evaluation. Patient was referred on 06/18/17 by Dr. Hyman Hopes.  Patient was last seen by Primary Care Provider on 04/12/17.  Patient has had elevated blood pressure and was told by neurology to contact us for blood pressure management.   Patient reports adherence with medications.  Current BP Medications include:  Losartan 25 mg BID (total dose 50 mg daily)  Antihypertensives tried in the past include: none   Patient reports chronic migraines and lower leg edema.  Patient requests prescription for blood pressure cuff and for blood sugar meter.  O:   Last 3 Office BP readings: BP Readings from Last 3 Encounters:  06/16/17 (!) 158/104  05/17/17 (!) 149/100  05/01/17 (!) 160/100    BMET    Component Value Date/Time   NA 139 01/09/2017 1130   K 4.4 01/09/2017 1130   CL 104 01/09/2017 1130   CO2 26 01/09/2017 1130   GLUCOSE 92 01/09/2017 1130   BUN 20 01/09/2017 1130   CREATININE 1.10 (H) 01/09/2017 1130   CALCIUM 9.8 01/09/2017 1130   GFRNONAA 54 (L) 01/09/2017 1130   GFRAA 62 01/09/2017 1130    A/P: Hypertension longstanding diagnosed currently uncontrolled  on current medications.  Increased dose of losartan to 100 mg daily. Patient prefers to split this and take 50 mg BID. Discussed amlodipine with patient but she reports LLE so will hold off on that and max out losartan dose. Scripts sent in for losartan, blood pressure cuff, and blood sugar meter. Will get BMET at next visit to assess SCr and potassium with dose increase.  Results reviewed and written information provided.   Total time in face-to-face counseling 15 minutes.   F/U Clinic Visit with me in 1 week.

## 2017-06-20 NOTE — Patient Instructions (Signed)
Thank you for coming to see me  I ordered blood sugar testing supplies and a blood pressure cuff.  Increase the losartan to 50 mg twice daily. I sent in the new prescription for the 50 mg tablets.  Come back in 1 week for a blood pressure check

## 2017-06-23 NOTE — Telephone Encounter (Signed)
Called pt. And left VM to return call and schedule an appt. With [facility] for a BP check.

## 2017-06-26 ENCOUNTER — Other Ambulatory Visit: Payer: Self-pay | Admitting: Internal Medicine

## 2017-06-29 ENCOUNTER — Encounter: Payer: Medicare Other | Admitting: Pharmacist

## 2017-06-30 ENCOUNTER — Other Ambulatory Visit: Payer: Medicare Other

## 2017-06-30 DIAGNOSIS — M79672 Pain in left foot: Secondary | ICD-10-CM | POA: Diagnosis not present

## 2017-06-30 DIAGNOSIS — I10 Essential (primary) hypertension: Secondary | ICD-10-CM

## 2017-06-30 DIAGNOSIS — G43009 Migraine without aura, not intractable, without status migrainosus: Secondary | ICD-10-CM

## 2017-06-30 DIAGNOSIS — G459 Transient cerebral ischemic attack, unspecified: Secondary | ICD-10-CM | POA: Diagnosis not present

## 2017-06-30 DIAGNOSIS — M898X7 Other specified disorders of bone, ankle and foot: Secondary | ICD-10-CM | POA: Diagnosis not present

## 2017-07-01 LAB — LIPID PANEL WITH LDL/HDL RATIO
Cholesterol, Total: 160 mg/dL (ref 100–199)
HDL: 49 mg/dL (ref >39)
LDL Calculated: 94 mg/dL (ref 0–99)
LDl/HDL Ratio: 1.9 ratio (ref 0.0–3.2)
Triglycerides: 84 mg/dL (ref 0–149)
VLDL Cholesterol Cal: 17 mg/dL (ref 5–40)

## 2017-07-03 DIAGNOSIS — R112 Nausea with vomiting, unspecified: Secondary | ICD-10-CM | POA: Diagnosis not present

## 2017-07-03 DIAGNOSIS — K589 Irritable bowel syndrome without diarrhea: Secondary | ICD-10-CM | POA: Diagnosis not present

## 2017-07-03 DIAGNOSIS — K219 Gastro-esophageal reflux disease without esophagitis: Secondary | ICD-10-CM | POA: Diagnosis not present

## 2017-07-04 ENCOUNTER — Encounter: Payer: Medicare Other | Admitting: Pharmacist

## 2017-07-04 DIAGNOSIS — K219 Gastro-esophageal reflux disease without esophagitis: Secondary | ICD-10-CM | POA: Diagnosis not present

## 2017-07-04 DIAGNOSIS — K30 Functional dyspepsia: Secondary | ICD-10-CM | POA: Diagnosis not present

## 2017-07-04 DIAGNOSIS — R110 Nausea: Secondary | ICD-10-CM | POA: Diagnosis not present

## 2017-07-04 DIAGNOSIS — R1013 Epigastric pain: Secondary | ICD-10-CM | POA: Diagnosis not present

## 2017-07-04 DIAGNOSIS — R112 Nausea with vomiting, unspecified: Secondary | ICD-10-CM | POA: Diagnosis not present

## 2017-07-04 DIAGNOSIS — R634 Abnormal weight loss: Secondary | ICD-10-CM | POA: Diagnosis not present

## 2017-07-04 NOTE — Progress Notes (Deleted)
   S:    Patient presents to the clinic for hypertension evaluation. Patient was referred on 06/18/17 by Dr. Hyman Hopes.  Patient was last seen by Primary Care Provider on 04/12/17.  Patient has had elevated blood pressure and was told by neurology to contact us for blood pressure management.   Patient reports adherence with medications.  Current BP Medications include:  Losartan 50 mg BID (100 mg total daily - split dose is patient's preference)  Antihypertensives tried in the past include: none   Patient reports chronic migraines and lower leg edema.  Patient requests prescription for blood pressure cuff and for blood sugar meter.  O:   Last 3 Office BP readings: BP Readings from Last 3 Encounters:  06/20/17 (!) 151/98  06/16/17 (!) 158/104  05/17/17 (!) 149/100    BMET    Component Value Date/Time   NA 139 01/09/2017 1130   K 4.4 01/09/2017 1130   CL 104 01/09/2017 1130   CO2 26 01/09/2017 1130   GLUCOSE 92 01/09/2017 1130   BUN 20 01/09/2017 1130   CREATININE 1.10 (H) 01/09/2017 1130   CALCIUM 9.8 01/09/2017 1130   GFRNONAA 54 (L) 01/09/2017 1130   GFRAA 62 01/09/2017 1130    A/P: Hypertension longstanding diagnosed currently uncontrolled  on current medications.  Increased dose of losartan to 100 mg daily. Patient prefers to split this and take 50 mg BID. Discussed amlodipine with patient but she reports LLE so will hold off on that and max out losartan dose. Scripts sent in for losartan, blood pressure cuff, and blood sugar meter. Will get BMET at next visit to assess SCr and potassium with dose increase.  Results reviewed and written information provided.   Total time in face-to-face counseling 15 minutes.   F/U Clinic Visit with me in 1 week.

## 2017-07-05 ENCOUNTER — Telehealth: Payer: Self-pay

## 2017-07-05 NOTE — Telephone Encounter (Signed)
-----   Message from Drema Dallas, DO sent at 07/04/2017  1:58 PM EDT ----- The LDL (bad cholesterol) is 94.  Five months ago, it was 95 and we increased the dose of atorvastatin from  to  daily.  Is she taking ?  If she is, then I want to stress that she should participate in routine cardiovascular exercise and watch her diet (Mediterranean diet).

## 2017-07-05 NOTE — Telephone Encounter (Signed)
calleed and spoke with Pt about her lipid results. Pt states she is taking th atorvastatin  daily. Stressed the importance of cardiovascular exercise at least 4-5 x times a week and to look up the Mediterranean diet online since she was not aware of what it consisted of. Went over some of it with her, but she said she had Internet access and would look it up.

## 2017-07-13 DIAGNOSIS — Z1389 Encounter for screening for other disorder: Secondary | ICD-10-CM | POA: Diagnosis not present

## 2017-07-13 DIAGNOSIS — Z13220 Encounter for screening for lipoid disorders: Secondary | ICD-10-CM | POA: Diagnosis not present

## 2017-07-13 DIAGNOSIS — I10 Essential (primary) hypertension: Secondary | ICD-10-CM | POA: Diagnosis not present

## 2017-07-13 DIAGNOSIS — Z1329 Encounter for screening for other suspected endocrine disorder: Secondary | ICD-10-CM | POA: Diagnosis not present

## 2017-07-13 DIAGNOSIS — E1165 Type 2 diabetes mellitus with hyperglycemia: Secondary | ICD-10-CM | POA: Diagnosis not present

## 2017-07-13 DIAGNOSIS — I6789 Other cerebrovascular disease: Secondary | ICD-10-CM | POA: Diagnosis not present

## 2017-07-14 DIAGNOSIS — L03032 Cellulitis of left toe: Secondary | ICD-10-CM | POA: Diagnosis not present

## 2017-07-19 ENCOUNTER — Ambulatory Visit: Payer: Medicare Other | Admitting: Internal Medicine

## 2017-07-27 DIAGNOSIS — E1165 Type 2 diabetes mellitus with hyperglycemia: Secondary | ICD-10-CM | POA: Diagnosis not present

## 2017-07-27 DIAGNOSIS — Z131 Encounter for screening for diabetes mellitus: Secondary | ICD-10-CM | POA: Diagnosis not present

## 2017-07-27 DIAGNOSIS — R7989 Other specified abnormal findings of blood chemistry: Secondary | ICD-10-CM | POA: Diagnosis not present

## 2017-07-27 DIAGNOSIS — I10 Essential (primary) hypertension: Secondary | ICD-10-CM | POA: Diagnosis not present

## 2017-07-27 DIAGNOSIS — L03032 Cellulitis of left toe: Secondary | ICD-10-CM | POA: Diagnosis not present

## 2017-07-27 DIAGNOSIS — I6789 Other cerebrovascular disease: Secondary | ICD-10-CM | POA: Diagnosis not present

## 2017-07-27 DIAGNOSIS — G4700 Insomnia, unspecified: Secondary | ICD-10-CM | POA: Diagnosis not present

## 2017-08-10 DIAGNOSIS — E1165 Type 2 diabetes mellitus with hyperglycemia: Secondary | ICD-10-CM | POA: Diagnosis not present

## 2017-08-10 DIAGNOSIS — R197 Diarrhea, unspecified: Secondary | ICD-10-CM | POA: Diagnosis not present

## 2017-08-10 DIAGNOSIS — G4700 Insomnia, unspecified: Secondary | ICD-10-CM | POA: Diagnosis not present

## 2017-08-10 DIAGNOSIS — I10 Essential (primary) hypertension: Secondary | ICD-10-CM | POA: Diagnosis not present

## 2017-08-10 DIAGNOSIS — K529 Noninfective gastroenteritis and colitis, unspecified: Secondary | ICD-10-CM | POA: Diagnosis not present

## 2017-08-24 ENCOUNTER — Other Ambulatory Visit: Payer: Self-pay | Admitting: Internal Medicine

## 2017-08-24 DIAGNOSIS — J069 Acute upper respiratory infection, unspecified: Secondary | ICD-10-CM

## 2017-09-13 DIAGNOSIS — G4700 Insomnia, unspecified: Secondary | ICD-10-CM | POA: Diagnosis not present

## 2017-09-13 DIAGNOSIS — H9012 Conductive hearing loss, unilateral, left ear, with unrestricted hearing on the contralateral side: Secondary | ICD-10-CM | POA: Diagnosis not present

## 2017-09-13 DIAGNOSIS — E1165 Type 2 diabetes mellitus with hyperglycemia: Secondary | ICD-10-CM | POA: Diagnosis not present

## 2017-09-13 DIAGNOSIS — I10 Essential (primary) hypertension: Secondary | ICD-10-CM | POA: Diagnosis not present

## 2017-09-20 ENCOUNTER — Other Ambulatory Visit: Payer: Self-pay | Admitting: Pharmacist

## 2017-09-20 MED ORDER — TRAZODONE HCL 100 MG PO TABS: 100.0000 mg | ORAL_TABLET | Freq: Every evening | ORAL | 0 refills | Status: DC | PRN

## 2017-09-27 ENCOUNTER — Emergency Department (HOSPITAL_COMMUNITY): Payer: Medicare Other

## 2017-09-27 ENCOUNTER — Emergency Department (HOSPITAL_COMMUNITY)
Admission: EM | Admit: 2017-09-27 | Discharge: 2017-09-27 | Disposition: A | Discharge status: Home / Self Care | Payer: Medicare Other | Attending: Emergency Medicine | Admitting: Emergency Medicine

## 2017-09-27 ENCOUNTER — Encounter (HOSPITAL_COMMUNITY): Payer: Self-pay | Admitting: Emergency Medicine

## 2017-09-27 ENCOUNTER — Other Ambulatory Visit: Payer: Self-pay

## 2017-09-27 DIAGNOSIS — I10 Essential (primary) hypertension: Secondary | ICD-10-CM | POA: Insufficient documentation

## 2017-09-27 DIAGNOSIS — Z7982 Long term (current) use of aspirin: Secondary | ICD-10-CM | POA: Diagnosis not present

## 2017-09-27 DIAGNOSIS — Z91040 Latex allergy status: Secondary | ICD-10-CM | POA: Insufficient documentation

## 2017-09-27 DIAGNOSIS — E119 Type 2 diabetes mellitus without complications: Secondary | ICD-10-CM | POA: Insufficient documentation

## 2017-09-27 DIAGNOSIS — G4700 Insomnia, unspecified: Secondary | ICD-10-CM | POA: Diagnosis not present

## 2017-09-27 DIAGNOSIS — Z79899 Other long term (current) drug therapy: Secondary | ICD-10-CM | POA: Diagnosis not present

## 2017-09-27 DIAGNOSIS — Z7984 Long term (current) use of oral hypoglycemic drugs: Secondary | ICD-10-CM | POA: Diagnosis not present

## 2017-09-27 DIAGNOSIS — E1165 Type 2 diabetes mellitus with hyperglycemia: Secondary | ICD-10-CM | POA: Diagnosis not present

## 2017-09-27 DIAGNOSIS — R51 Headache: Secondary | ICD-10-CM | POA: Diagnosis not present

## 2017-09-27 DIAGNOSIS — E860 Dehydration: Secondary | ICD-10-CM | POA: Diagnosis not present

## 2017-09-27 DIAGNOSIS — R531 Weakness: Secondary | ICD-10-CM | POA: Insufficient documentation

## 2017-09-27 DIAGNOSIS — R42 Dizziness and giddiness: Secondary | ICD-10-CM | POA: Diagnosis not present

## 2017-09-27 LAB — DIFFERENTIAL
BASOS PCT: 0 %
Basophils Absolute: 0 10*3/uL (ref 0.0–0.1)
EOS PCT: 1 %
Eosinophils Absolute: 0.1 10*3/uL (ref 0.0–0.7)
Lymphocytes Relative: 37 %
Lymphs Abs: 1.8 10*3/uL (ref 0.7–4.0)
MONO ABS: 0.4 10*3/uL (ref 0.1–1.0)
Monocytes Relative: 9 %
NEUTROS ABS: 2.6 10*3/uL (ref 1.7–7.7)
Neutrophils Relative %: 53 %

## 2017-09-27 LAB — BASIC METABOLIC PANEL
ANION GAP: 8 (ref 5–15)
BUN: 35 mg/dL — ABNORMAL HIGH (ref 6–20)
CALCIUM: 8.9 mg/dL (ref 8.9–10.3)
CO2: 20 mmol/L — AB (ref 22–32)
CREATININE: 1.74 mg/dL — AB (ref 0.44–1.00)
Chloride: 108 mmol/L (ref 101–111)
GFR, EST AFRICAN AMERICAN: 35 mL/min — AB (ref >60)
GFR, EST NON AFRICAN AMERICAN: 30 mL/min — AB (ref >60)
Glucose, Bld: 80 mg/dL (ref 65–99)
Potassium: 4.6 mmol/L (ref 3.5–5.1)
Sodium: 136 mmol/L (ref 135–145)

## 2017-09-27 LAB — CBC
HCT: 27.4 % — ABNORMAL LOW (ref 36.0–46.0)
HEMOGLOBIN: 9.1 g/dL — AB (ref 12.0–15.0)
MCH: 28.3 pg (ref 26.0–34.0)
MCHC: 33.2 g/dL (ref 30.0–36.0)
MCV: 85.4 fL (ref 78.0–100.0)
PLATELETS: 201 10*3/uL (ref 150–400)
RBC: 3.21 MIL/uL — AB (ref 3.87–5.11)
RDW: 14.8 % (ref 11.5–15.5)
WBC: 4.9 10*3/uL (ref 4.0–10.5)

## 2017-09-27 LAB — URINALYSIS, ROUTINE W REFLEX MICROSCOPIC
Bacteria, UA: NONE SEEN
Bilirubin Urine: NEGATIVE
GLUCOSE, UA: NEGATIVE mg/dL
HGB URINE DIPSTICK: NEGATIVE
Ketones, ur: NEGATIVE mg/dL
NITRITE: NEGATIVE
Protein, ur: NEGATIVE mg/dL
Specific Gravity, Urine: 1.004 — ABNORMAL LOW (ref 1.005–1.030)
pH: 5 (ref 5.0–8.0)

## 2017-09-27 LAB — HEPATIC FUNCTION PANEL
ALBUMIN: 4 g/dL (ref 3.5–5.0)
ALT: 13 U/L — ABNORMAL LOW (ref 14–54)
AST: 25 U/L (ref 15–41)
Alkaline Phosphatase: 63 U/L (ref 38–126)
BILIRUBIN TOTAL: 0.9 mg/dL (ref 0.3–1.2)
Bilirubin, Direct: 0.2 mg/dL (ref 0.1–0.5)
Indirect Bilirubin: 0.7 mg/dL (ref 0.3–0.9)
Total Protein: 7.6 g/dL (ref 6.5–8.1)

## 2017-09-27 LAB — I-STAT TROPONIN, ED: TROPONIN I, POC: 0 ng/mL (ref 0.00–0.08)

## 2017-09-27 LAB — CBG MONITORING, ED: Glucose-Capillary: 77 mg/dL (ref 65–99)

## 2017-09-27 MED ORDER — SODIUM CHLORIDE 0.9 % IV BOLUS (SEPSIS)
1000.0000 mL | Freq: Once | INTRAVENOUS | Status: AC
  Administered 2017-09-27: 1000 mL via INTRAVENOUS

## 2017-09-27 MED ORDER — HYDROMORPHONE HCL 1 MG/ML IJ SOLN
1.0000 mg | Freq: Once | INTRAMUSCULAR | Status: AC
  Administered 2017-09-27: 1 mg via INTRAVENOUS
  Filled 2017-09-27: qty 1

## 2017-09-27 MED ORDER — ONDANSETRON HCL 4 MG/2ML IJ SOLN
4.0000 mg | Freq: Once | INTRAMUSCULAR | Status: AC
  Administered 2017-09-27: 4 mg via INTRAVENOUS
  Filled 2017-09-27: qty 2

## 2017-09-27 NOTE — ED Notes (Signed)
Patient transported to X-ray 

## 2017-09-27 NOTE — ED Triage Notes (Addendum)
Pt reports she has been having dizziness, HA, slow speech, and bilateral fingertip/toe numbness for the past 3 days. Pt also complains of feeling cold. Denies blurred vision or unilateral weakness.

## 2017-09-27 NOTE — ED Notes (Signed)
Bed: WA02 Expected date:  Expected time:  Means of arrival:  Comments: K[place]

## 2017-09-27 NOTE — ED Provider Notes (Signed)
[facility] DEPT Provider Note   CSN: [id number] Arrival date & time: 09/27/17  1231     History   Chief Complaint Chief Complaint  Patient presents with  . Dizziness    HPI Julie Gilbert is a 67 y.o. female.  Patient was sent over here for dizziness.  Patient has a history of vertigo.  He also complains of some weakness   The history is provided by the patient.  Dizziness  Quality:  Head spinning and imbalance Severity:  Moderate Onset quality:  Gradual Timing:  Constant Progression:  Waxing and waning Chronicity:  Recurrent Context: not when bending over   Relieved by:  Nothing Associated symptoms: no chest pain, no diarrhea and no headaches     Past Medical History:  Diagnosis Date  . Asthma   . Diabetes mellitus without complication ([place])   . Hypertension   . IBS (irritable bowel syndrome)     Patient Active Problem List   Diagnosis Date Noted  . Herniated intervertebral disc of lumbar spine 05/01/2017  . Bilateral leg pain 02/03/2017  . Osteoarthritis of spine 02/03/2017  . Moderate episode of recurrent major depressive disorder ([place]) 01/04/2017  . Infective urethritis 01/04/2017  . Colon cancer screening 01/04/2017  . Patient left before evaluation by physician 12/24/2016  . Chronic anemia 06/29/2016  . TIA (transient ischemic attack) 06/29/2016  . Transient ischemic attack (TIA) 06/28/2016  . Arthralgia 08/12/2015  . Gastroesophageal reflux disease without esophagitis 06/18/2015  . History of migraine headaches 05/13/2015  . Essential hypertension 05/13/2015  . Diabetes mellitus type 2, controlled ([place]) 05/13/2015  . IBS (irritable bowel syndrome) 05/13/2015  . Migraine headache 05/13/2015  . Depression 05/13/2015  . Insomnia 05/13/2015    Past Surgical History:  Procedure Laterality Date  . CESAREAN SECTION    . EXPLORATORY LAPAROTOMY      OB History    No data available       Home Medications     Prior to Admission medications   Medication Sig Start Date End Date Taking? Authorizing Provider  Ascorbic Acid (VITAMIN C) 1000 MG tablet Take 1,000 mg by mouth daily.   Yes [provider]  aspirin 81 MG tablet Take 81 mg by mouth daily.   Yes [provider]  atorvastatin (LIPITOR) 80 MG tablet Take 1 tablet (80 mg total) by mouth daily. 01/10/17  Yes Tresa Garter, MD  Blood Glucose Monitoring Suppl (ACCU-CHEK AVIVA PLUS) w/Device KIT Use as directed to check blood sugar once daily. E11.9 06/20/17  Yes Tresa Garter, MD  Blood Pressure Monitoring (BLOOD PRESSURE KIT) DEVI 1 application by Does not apply route daily. Use to check blood pressure daily 06/20/17  Yes Angelica Chessman E, MD  Cholecalciferol 1000 units tablet Take 1,000 Units by mouth daily.   Yes [provider]  clonazePAM (KLONOPIN) 1 MG tablet Take 1 mg by mouth 3 (three) times daily. 09/13/17  Yes [provider]  COD LIVER OIL PO Take 500 Units by mouth daily.   Yes [provider]  DEXILANT 60 MG capsule Take 60 mg by mouth daily. 09/20/17  Yes [provider]  diphenhydrAMINE (BENADRYL) 25 MG tablet Take 25 mg by mouth every 6 (six) hours as needed for sleep.   Yes [provider]  Ferrous Sulfate (SLOW FE) 142 (45 Fe) MG TBCR Take 1 tablet by mouth daily.   Yes [provider]  Fish Oil-Cholecalciferol (FISH OIL + D3) 1000-1000 MG-UNIT CAPS  Take 1 tablet by mouth daily.   Yes [provider]  fluticasone (FLONASE) 50 MCG/ACT nasal spray SPRAY 2 SPRAYS INTO EACH NOSTRIL EVERY DAY 08/24/17  Yes Jegede, Olugbemiga E, MD  glucose blood (ACCU-CHEK AVIVA PLUS) test strip Use as instructed to check blood sugar once daily. E11.9 06/20/17  Yes Jegede, Olugbemiga E, MD  JANUVIA 50 MG tablet Take 50 mg by mouth daily. 09/07/17  Yes [provider]  Lancets (ACCU-CHEK SOFT TOUCH) lancets Use as instructed to check blood sugar once  daily E11.9 06/20/17  Yes Jegede, Olugbemiga E, MD  linaclotide (LINZESS) 145 MCG CAPS capsule Take 1 capsule (145 mcg total) by mouth daily as needed (FOR IBS). 01/10/17  Yes Tresa Garter, MD  meclizine (ANTIVERT) 25 MG tablet Take 1 tablet (25 mg total) by mouth every 8 (eight) hours as needed for dizziness. 01/10/17  Yes Tresa Garter, MD  Multiple Vitamin (MULTIVITAMIN WITH MINERALS) TABS tablet Take 1 tablet by mouth daily.   Yes [provider]  naproxen sodium (ALEVE) 220 MG tablet Take 440 mg by mouth 2 (two) times daily as needed (pain).   Yes [provider]  olmesartan-hydrochlorothiazide (BENICAR HCT) 40-25 MG tablet Take 1 tablet by mouth daily. 09/05/17  Yes [provider]  promethazine (PHENERGAN) 12.5 MG tablet Take 12.5-25 mg by mouth every 6 (six) hours as needed for nausea or vomiting.  09/22/17  Yes [provider]  topiramate (TOPAMAX) 50 MG tablet Take 1 tablet (50 mg total) by mouth 2 (two) times daily. 06/16/17  Yes Jaffe, Adam R, DO  traZODone (DESYREL) 100 MG tablet Take 1 tablet (100 mg total) by mouth at bedtime as needed for sleep. 09/20/17  Yes Tresa Garter, MD  gabapentin (NEURONTIN) 300 MG capsule Start with 1 tab po qhs X 1 week, then increase to 1 tab po bid X 1 week then 1 tab po tid prn Patient not taking: Reported on 09/27/2017 04/20/17   Tresa Garter, MD  guaiFENesin-codeine (ROBITUSSIN AC) 100-10 MG/5ML syrup Take 10 mLs by mouth 3 (three) times daily as needed for cough. Patient not taking: Reported on 06/20/2017 04/12/17   Tresa Garter, MD  losartan (COZAAR) 50 MG tablet Take 1 tablet (50 mg total) by mouth 2 (two) times daily. Patient not taking: Reported on 09/27/2017 06/20/17   Tresa Garter, MD  metFORMIN (GLUCOPHAGE) 500 MG tablet Take 1 tablet (500 mg total) by mouth 2 (two) times daily with a meal. Patient not taking: Reported on 09/27/2017 01/10/17   Tresa Garter, MD   ondansetron (ZOFRAN ODT) 4 MG disintegrating tablet Take 1 tablet (4 mg total) by mouth every 8 (eight) hours as needed for nausea or vomiting. Patient not taking: Reported on 09/27/2017 08/04/16   Tresa Garter, MD  pantoprazole (PROTONIX) 40 MG tablet Take 1 tablet (40 mg total) by mouth daily. Patient not taking: Reported on 09/27/2017 01/10/17   Tresa Garter, MD    Family History Family History  Problem Relation Age of Onset  . Cancer Maternal Grandfather   . Kidney failure Mother   . Hypertension Mother   . Heart disease Father   . Breast cancer Neg Hx     Social History Social History   Tobacco Use  . Smoking status: Former Smoker    Last attempt to quit: 09/22/2012    Years since quitting: 5.0  . Smokeless tobacco: Never Used  Substance Use Topics  . Alcohol use: Yes  Alcohol/week: 0.6 oz    Types: 1 Glasses of wine per week    Comment: red wine 1-2 x month  . Drug use: No     Allergies   Peanuts [peanut oil]; Shrimp [shellfish allergy]; Latex; Penicillins; and Pineapple   Review of Systems Review of Systems  Constitutional: Negative for appetite change and fatigue.  HENT: Negative for congestion, ear discharge and sinus pressure.   Eyes: Negative for discharge.  Respiratory: Negative for cough.   Cardiovascular: Negative for chest pain.  Gastrointestinal: Negative for abdominal pain and diarrhea.  Genitourinary: Negative for frequency and hematuria.  Musculoskeletal: Negative for back pain.  Skin: Negative for rash.  Neurological: Positive for dizziness. Negative for seizures and headaches.  Psychiatric/Behavioral: Negative for hallucinations.     Physical Exam Updated Vital Signs BP (!) 133/93   Pulse 68   Temp 97.9 F (36.6 C) (Oral)   Resp 16   LMP  (LMP Unknown) Comment: Menopausal  SpO2 100%   Physical Exam  Constitutional: [name], place, and time. She appears well-developed.  HENT:  Head: Normocephalic.   Eyes: Conjunctivae and EOM are normal. No scleral icterus.  Neck: Neck supple. No thyromegaly present.  Cardiovascular: Normal rate and regular rhythm. Exam reveals no gallop and no friction rub.  No murmur heard. Pulmonary/Chest: No stridor. She has no wheezes. She has no rales. She exhibits no tenderness.  Abdominal: She exhibits no distension. There is no tenderness. There is no rebound.  Musculoskeletal: Normal range of motion. She exhibits no edema.  Lymphadenopathy:    She has no cervical adenopathy.  Neurological: [name], place, and time. She exhibits normal muscle tone. Coordination normal.  Skin: No rash noted. No erythema.  Psychiatric: She has a normal mood and affect. Her behavior is normal.     ED Treatments / Results  Labs (all labs ordered are listed, but only abnormal results are displayed) Labs Reviewed  BASIC METABOLIC PANEL - Abnormal; Notable for the following components:      Result Value   CO2 20 (*)    BUN 35 (*)    Creatinine, Ser 1.74 (*)    GFR calc non Af Amer 30 (*)    GFR calc Af Amer 35 (*)    All other components within normal limits  CBC - Abnormal; Notable for the following components:   RBC 3.21 (*)    Hemoglobin 9.1 (*)    HCT 27.4 (*)    All other components within normal limits  URINALYSIS, ROUTINE W REFLEX MICROSCOPIC - Abnormal; Notable for the following components:   Color, Urine STRAW (*)    Specific Gravity, Urine 1.004 (*)    Leukocytes, UA TRACE (*)    Squamous Epithelial / LPF 0-5 (*)    All other components within normal limits  HEPATIC FUNCTION PANEL - Abnormal; Notable for the following components:   ALT 13 (*)    All other components within normal limits  DIFFERENTIAL  CBG MONITORING, ED  I-STAT TROPONIN, ED    EKG  EKG Interpretation  Date/Time:  Wednesday September 27 2017 13:02:25 EST Ventricular Rate:  65 PR Interval:    QRS Duration: 100 QT Interval:  417 QTC Calculation: 434 R  Axis:   17 Text Interpretation:  Sinus rhythm Abnormal T, consider ischemia, diffuse leads Confirmed by Milton Ferguson [phone]) on 09/27/2017 3:15:34 PM Also confirmed by Milton Ferguson [phone])  on 09/27/2017 4:15:48 PM Also confirmed by Milton Ferguson [phone])  on 09/27/2017 5:17:34 PM       Radiology Dg Chest 2 View  Result Date: 09/27/2017 CLINICAL DATA:  Dizziness with headache. EXAM: CHEST  2 VIEW COMPARISON:  11/05/2015 FINDINGS: The lungs are clear without focal pneumonia, edema, pneumothorax or pleural effusion. The cardiopericardial silhouette is within normal limits for size. The visualized bony structures of the thorax are intact. IMPRESSION: No active cardiopulmonary disease. Electronically Signed   By: Misty Stanley M.D.   On: 09/27/2017 15:44   Ct Head Wo Contrast  Result Date: 09/27/2017 CLINICAL DATA:  67 y/o F; dizziness, headache, slow speech, and bilateral finger tip/ toe numbness for 3 days. EXAM: CT HEAD WITHOUT CONTRAST TECHNIQUE: Contiguous axial images were obtained from the base of the skull through the vertex without intravenous contrast. COMPARISON:  06/28/2016 CT head.  06/29/2016 MRI head. FINDINGS: Brain: No evidence of acute infarction, hemorrhage, hydrocephalus, extra-axial collection or mass lesion/mass effect. Stable mild chronic microvascular ischemic changes of the brain. Stable small chronic lacunar infarct in right caudate head. Vascular: Calcific atherosclerosis of carotid siphons. No hyperdense vessel. Skull: Normal. Negative for fracture or focal lesion. Sinuses/Orbits: No acute finding. Other: None. IMPRESSION: 1. No acute intracranial abnormality identified. 2. Stable mild chronic microvascular ischemic changes and parenchymal volume loss of the brain. Stable small chronic lacunar infarct in right caudate head. Electronically Signed   By: Kristine Garbe M.D.   On: 09/27/2017 15:56    Procedures Procedures (including critical care time)  Medications  Ordered in ED Medications  sodium chloride 0.9 % bolus 1,000 mL (1,000 mLs Intravenous New Bag/Given 09/27/17 1552)  HYDROmorphone (DILAUDID) injection 1 mg (1 mg Intravenous Given 09/27/17 1551)  ondansetron (ZOFRAN) injection 4 mg (4 mg Intravenous Given 09/27/17 1551)     Initial Impression / Assessment and Plan / ED Course  I have reviewed the triage vital signs and the nursing notes.  Pertinent labs & imaging results that were available during my care of the patient were reviewed by me and considered in my medical decision making (see chart for details).     Patient has dizziness which is related to her vertigo.  She is also dehydrated and has been rehydrated.  She has mild anemia and inverted T waves on her EKG.  Patient will be discharged home to follow-up with her primary care doctor for her anemia and her vertigo and has been referred to cardiology for her EKG changes  Final Clinical Impressions(s) / ED Diagnoses   Final diagnoses:  Dizziness  Vertigo    ED Discharge Orders    None       Milton Ferguson, MD 09/27/17 1723

## 2017-09-28 DIAGNOSIS — L600 Ingrowing nail: Secondary | ICD-10-CM | POA: Diagnosis not present

## 2017-09-28 DIAGNOSIS — E119 Type 2 diabetes mellitus without complications: Secondary | ICD-10-CM | POA: Diagnosis not present

## 2017-10-21 ENCOUNTER — Other Ambulatory Visit: Payer: Self-pay | Admitting: Internal Medicine

## 2017-10-27 DIAGNOSIS — R51 Headache: Secondary | ICD-10-CM | POA: Diagnosis not present

## 2017-10-27 DIAGNOSIS — R079 Chest pain, unspecified: Secondary | ICD-10-CM | POA: Insufficient documentation

## 2017-10-27 DIAGNOSIS — I10 Essential (primary) hypertension: Secondary | ICD-10-CM | POA: Diagnosis not present

## 2017-10-27 DIAGNOSIS — R9431 Abnormal electrocardiogram [ECG] [EKG]: Secondary | ICD-10-CM | POA: Diagnosis not present

## 2017-11-17 ENCOUNTER — Encounter: Payer: Self-pay | Admitting: Neurology

## 2017-11-17 ENCOUNTER — Ambulatory Visit (INDEPENDENT_AMBULATORY_CARE_PROVIDER_SITE_OTHER): Payer: Medicare Other | Admitting: Neurology

## 2017-11-17 VITALS — BP 128/82 | HR 64 | Ht 67.0 in | Wt 192.6 lb

## 2017-11-17 DIAGNOSIS — G43009 Migraine without aura, not intractable, without status migrainosus: Secondary | ICD-10-CM | POA: Diagnosis not present

## 2017-11-17 DIAGNOSIS — R270 Ataxia, unspecified: Secondary | ICD-10-CM | POA: Diagnosis not present

## 2017-11-17 NOTE — Patient Instructions (Signed)
1.  We will get MRI of the brain to see if anything is causing the balance problems. 2.  If MRI does not explain it, then we will likely have to stop the topiramate and probably switch to another medication. 3.  Follow up afterward

## 2017-11-17 NOTE — Progress Notes (Signed)
NEUROLOGY FOLLOW UP OFFICE NOTE  BRITTIN BELNAP [id number]  HISTORY OF PRESENT ILLNESS: Julie Gilbert is a 67 year old right-handed female with hypetension, type 2 diabetes, migraine and chronic anemia who follows up for migraine and TIA.  She is accompanied by her daughter who supplements history.   UPDATE: Since increasing topiramate in September, migraines are significantly improved. Intensity:  6/10; September: 10/10 Duration:  45 minutes; September: 45 to 60 minutes with Aleve and then intensity reduces to 4/10 Frequency:  1 day a month; September: 2 to 3 days per week Frequency of abortive medication: no Current NSAIDS:  Aleve Current analgesics:  no Current triptans:  no Current anti-emetic:  Zofran ODT 29m Current muscle relaxants:  no Current anti-anxiolytic:  no Current sleep aide:  no Current Antihypertensive medications:  Benicar Current Antidepressant medications:  no Current Anticonvulsant medications:  topiramate 526mtwice daily Current Vitamins/Herbal/Supplements:  Fish oil, Centrum, vitamin C Current Antihistamines/Decongestants:  no Other therapy:  No    Caffeine:  1 cup coffee daily Alcohol:  no Smoker:  no Diet:  Hydrates Exercise:  no Depression: no; Anxiety: no Other pain:  no Sleep hygiene:  okay   She takes ASA 8174mor secondary stroke prevention.  She is taking Lipitor 74m64mLDL from October was 94.  She was seen in the ED on 09/27/17 for dizziness.  She described a spinning sensation and was diagnosed with vertigo.  She was found to be dehydrated.  Labs demonstrated mild anemia.  EKG showed inverted T waves and was referred to cardiology.  Since then, she has not been feeling well.  She is feeling off-balance but it is not associated with vertigo or lightheadedness.  She has had 2 falls.  Her last fall was last week.  She reports increased hiccups.  Sometimes it is hard to focus.  There is no associated neck pain, double vision, dysphagia,  unilateral numbness or weakness, numbness in feet.   She tried stopping topiramate, Januvia and atorvastatin for a couple of days and her balance improved.  However, her topiramate was changed back in September.  Her blood pressure medication was changed from losartan to Benicar.  She changed dose time from morning to evening, which helped a little.  Trazodone was switched to clonazepam at bedtime.  She tried stopping clonazepam but it didn't affect her balance.    HISTORY: Migraines: She has had migraines since age 89. 20hey are located bi-frontal, of pounding quality and usually 10/10 intensity.  They are associated with nausea, vomiting, photophobia, phonophobia and blurred vision.  They typically last all day and occur 2 days per week.  They are not preceded by aura.  She often wakes up with it.  There are no known triggers.  Relieving factors include sumatriptan.   Past abortive therapy:  Excedrin (increased blood pressure), Advil, Fioricet, sumatriptan Past preventive therapy:  Nortriptyline    Family history of headache:  2 daughters with migraines.   She also reports vertigo (spinning sensation) since childhood.  It is usually daily and not specifically positional.  She takes meclizine which doesn't always work.   TIA: [facility] after developing left upper extremity numbness and weakness associated with her typical migraine.  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  She was admitted.  MRI of brain was personally reviewed and revealed old right basal ganglia lacunar infarct and old small cerebellar infarcts but no acute findings.  MRA of head was negative.  Carotid doppler revealed no hemodynamically significant stenosis.  TTE  revealed EF 60-65% with no cardiac source of emboli.  LDL was 191.  Hgb A1c was 6.4.  She is on ASA 66m daily for secondary stroke prevention.  She was started on Lipitor 467mdaily.  CMP unremarkable.   She reports history of TIAs in the past.  PAST MEDICAL HISTORY: Past Medical History:  Diagnosis Date  . Asthma   . [place]  . Hypertension   . IBS (irritable bowel syndrome)     MEDICATIONS: Current Outpatient Medications on File Prior to Visit  Medication Sig Dispense Refill  . Ascorbic Acid (VITAMIN C) 1000 MG tablet Take 1,000 mg by mouth daily.    . Marland Kitchenspirin 81 MG tablet Take 81 mg by mouth daily.    . Marland Kitchentorvastatin (LIPITOR) 80 MG tablet Take 1 tablet (80 mg total) by mouth daily. 90 tablet 3  . Blood Glucose Monitoring Suppl (ACCU-CHEK AVIVA PLUS) w/Device KIT Use as directed to check blood sugar once daily. E11.9 1 kit 0  . Blood Pressure Monitoring (BLOOD PRESSURE KIT) DEVI 1 application by Does not apply route daily. Use to check blood pressure daily 1 Device 0  . Cholecalciferol 1000 units tablet Take 1,000 Units by mouth daily.    . clonazePAM (KLONOPIN) 1 MG tablet Take 1 mg by mouth 3 (three) times daily.  0  . COD LIVER OIL PO Take 500 Units by mouth daily.    . Marland KitchenEXILANT 60 MG capsule Take 60 mg by mouth daily.  3  . diphenhydrAMINE (BENADRYL) 25 MG tablet Take 25 mg by mouth every 6 (six) hours as needed for sleep.    . Ferrous Sulfate (SLOW FE) 142 (45 Fe) MG TBCR Take 1 tablet by mouth daily.    . Fish Oil-Cholecalciferol (FISH OIL + D3) 1000-1000 MG-UNIT CAPS Take 1 tablet by mouth daily.    . fluticasone (FLONASE) 50 MCG/ACT nasal spray SPRAY 2 SPRAYS INTO EACH NOSTRIL EVERY DAY 16 g 2  . gabapentin (NEURONTIN) 300 MG capsule Start with 1 tab po qhs X 1 week, then increase to 1 tab po bid X 1 week then 1 tab po tid prn (Patient not taking: Reported on 09/27/2017) 90 capsule 1  . glucose blood (ACCU-CHEK AVIVA PLUS) test strip Use as  instructed to check blood sugar once daily. E11.9 100 each 12  . guaiFENesin-codeine (ROBITUSSIN AC) 100-10 MG/5ML syrup Take 10 mLs by mouth 3 (three) times daily as needed for cough. (Patient not taking: Reported on 06/20/2017) 480 mL 1  . JANUVIA 50 MG tablet Take 50 mg by mouth daily.  2  . Lancets (ACCU-CHEK SOFT TOUCH) lancets Use as instructed to check blood sugar once daily E11.9 100 each 12  . linaclotide (LINZESS) 145 MCG CAPS capsule Take 1 capsule (145 mcg total) by mouth daily as needed (FOR IBS). 90 capsule 3  . losartan (COZAAR) 50 MG tablet Take 1 tablet (50 mg total) by mouth 2 (two) times daily. (Patient not taking: Reported on 09/27/2017) 60 tablet 2  . meclizine (ANTIVERT) 25 MG tablet Take 1 tablet (25 mg total) by mouth every 8 (eight) hours as needed for dizziness. 30 tablet 3  . metFORMIN (GLUCOPHAGE) 500 MG tablet Take  1 tablet (500 mg total) by mouth 2 (two) times daily with a meal. (Patient not taking: Reported on 09/27/2017) 180 tablet 3  . Multiple Vitamin (MULTIVITAMIN WITH MINERALS) TABS tablet Take 1 tablet by mouth daily.    . naproxen sodium (ALEVE) 220 MG tablet Take 440 mg by mouth 2 (two) times daily as needed (pain).    Marland Kitchen olmesartan-hydrochlorothiazide (BENICAR HCT) 40-25 MG tablet Take 1 tablet by mouth daily.  3  . ondansetron (ZOFRAN ODT) 4 MG disintegrating tablet Take 1 tablet (4 mg total) by mouth every 8 (eight) hours as needed for nausea or vomiting. (Patient not taking: Reported on 09/27/2017) 30 tablet 0  . pantoprazole (PROTONIX) 40 MG tablet Take 1 tablet (40 mg total) by mouth daily. (Patient not taking: Reported on 09/27/2017) 30 tablet 3  . promethazine (PHENERGAN) 12.5 MG tablet Take 12.5-25 mg by mouth every 6 (six) hours as needed for nausea or vomiting.   4  . topiramate (TOPAMAX) 50 MG tablet Take 1 tablet (50 mg total) by mouth 2 (two) times daily. 60 tablet 5  . traZODone (DESYREL) 100 MG tablet Take 1 tablet (100 mg total) by mouth at bedtime as  needed for sleep. 30 tablet 0   No current facility-administered medications on file prior to visit.     ALLERGIES: Allergies  Allergen Reactions  . Peanuts [Peanut Oil] Anaphylaxis  . Shrimp [Shellfish Allergy] Anaphylaxis  . Latex Hives  . Penicillins Hives    Has patient had a PCN reaction causing immediate rash, facial/tongue/throat swelling, SOB or lightheadedness with hypotension: No Has patient had a PCN reaction causing severe rash involving mucus membranes or skin necrosis: No Has patient had a PCN reaction that required hospitalization: No Has patient had a PCN reaction occurring within the last 10 years: No  If all of the above answers are "NO", then may proceed with Cephalosporin use.    Marland Kitchen Pineapple     Scratchy throat, Tongue swelling    FAMILY HISTORY: Family History  Problem Relation Age of Onset  . Cancer Maternal Grandfather   . Kidney failure Mother   . Hypertension Mother   . Heart disease Father   . Breast cancer Neg Hx     SOCIAL HISTORY: Social History   Socioeconomic History  . Marital status: Divorced    Spouse name: Not on file  . Number of children: Not on file  . Years of education: Not on file  . Highest education level: Not on file  Social Needs  . Financial resource strain: Not on file  . Food insecurity - worry: Not on file  . Food insecurity - inability: Not on file  . Transportation needs - medical: Not on file  . Transportation needs - non-medical: Not on file  Occupational History  . Not on file  Tobacco Use  . Smoking status: Former Smoker    Last attempt to quit: 09/22/2012    Years since quitting: 5.1  . Smokeless tobacco: Never Used  Substance and Sexual Activity  . Alcohol use: Yes    Alcohol/week: 0.6 oz    Types: 1 Glasses of wine per week    Comment: red wine 1-2 x month  . Drug use: No  . Sexual activity: Not on file  Other Topics Concern  . Not on file  Social History Narrative  . Not on file    REVIEW  OF SYSTEMS: Constitutional: No fevers, chills, or sweats, no generalized fatigue, change in appetite Eyes: No  visual changes, double vision, eye pain Ear, nose and throat: No hearing loss, ear pain, nasal congestion, sore throat Cardiovascular: No chest pain, palpitations Respiratory:  No shortness of breath at rest or with exertion, wheezes GastrointestinaI: No nausea, vomiting, diarrhea, abdominal pain, fecal incontinence Genitourinary:  No dysuria, urinary retention or frequency Musculoskeletal:  No neck pain, back pain Integumentary: No rash, pruritus, skin lesions Neurological: as above Psychiatric: No depression, insomnia, anxiety Endocrine: No palpitations, fatigue, diaphoresis, mood swings, change in appetite, change in weight, increased thirst Hematologic/Lymphatic:  No purpura, petechiae. Allergic/Immunologic: no itchy/runny eyes, nasal congestion, recent allergic reactions, rashes  PHYSICAL EXAM: Vitals:   11/17/17 0739  BP: 128/82  Pulse: 64  SpO2: 94%   General: No acute distress.  Patient appears well-groomed.   Head:  Normocephalic/atraumatic Eyes:  Fundi examined but not visualized Neck: supple, no paraspinal tenderness, full range of motion Heart:  Regular rate and rhythm Lungs:  Clear to auscultation bilaterally Back: No paraspinal tenderness Neurological Exam: alert and oriented to person, place, and time. Attention span and concentration intact, recent and remote memory intact, fund of knowledge intact.  Speech fluent and not dysarthric, language intact.  CN II-XII intact. Bulk and tone normal, muscle strength 5/5 throughout.  Sensation to pinprick and vibration mildly reduced in feet.  Deep tendon reflexes 2+ throughout, toes downgoing.  Finger to nose and heel to shin testing intact.  Staggering gait.  Able to tandem walk with some caution. Romberg negative.  IMPRESSION: 1.  Migraine without aura improved. 2.  Unsteady gait.  Unclear etiology.  It may be  medication-related.  She reports balance is improved when she stopped topiramate, Januvia and atorvastatin for a couple of days.  Most likely, it would be due to topiramate.  She would rather not discontinue topiramate since it helps with her migraines.  Other possible caused could be anemia or cardiac related.  She does have underlying neuropathy (diabetic) but this would not explain fairly sudden onset of symptoms.  Blood pressure is normal.  It does not appear to be associated with vertigo or orthostasis.  I would need to rule out stroke.  PLAN: 1.  Check MRI of brain  2.  If MRI of brain does not explain symptoms, then we will have to try discontinuing topiramate (and switching to another preventative). 3.  If discontinuing topiramate does not help, she should follow up with her PCP to investigate other medical causes. 4.  Follow up soon  Metta Clines, DO  CC: Lin Landsman, MD

## 2017-11-25 ENCOUNTER — Other Ambulatory Visit: Payer: Medicare Other

## 2017-11-28 ENCOUNTER — Ambulatory Visit
Admission: RE | Admit: 2017-11-28 | Discharge: 2017-11-28 | Disposition: A | Discharge status: Home / Self Care | Payer: Medicare Other | Source: Ambulatory Visit | Attending: Neurology | Admitting: Neurology

## 2017-11-28 DIAGNOSIS — R270 Ataxia, unspecified: Secondary | ICD-10-CM

## 2017-11-28 MED ORDER — GADOBENATE DIMEGLUMINE 529 MG/ML IV SOLN: 9.0000 mL | Freq: Once | INTRAVENOUS | Status: DC | PRN

## 2017-11-29 ENCOUNTER — Ambulatory Visit (INDEPENDENT_AMBULATORY_CARE_PROVIDER_SITE_OTHER): Payer: Medicare Other | Admitting: Neurology

## 2017-11-29 ENCOUNTER — Encounter: Payer: Self-pay | Admitting: Neurology

## 2017-11-29 VITALS — BP 118/80 | HR 66 | Wt 196.0 lb

## 2017-11-29 DIAGNOSIS — R270 Ataxia, unspecified: Secondary | ICD-10-CM | POA: Diagnosis not present

## 2017-11-29 NOTE — Progress Notes (Signed)
NEUROLOGY FOLLOW UP OFFICE NOTE  Julie Gilbert [id number]  HISTORY OF PRESENT ILLNESS: Julie Gilbert is a 67 year old right-handed female with hypetension, type 2 diabetes, chronic vertigo, migraine and chronic anemia who follows up for unsteady gait.  She is accompanied by her daughter who supplements history.   UPDATE: To assess unsteady gait, she had an MRI of the brain with and without contrast yesterday, which was personally reviewed and revealed stable mild to moderate chronic small vessel ischemic changes but no acute abnormality.  Her daughter changed many of her medications to evening.  She is still taking topiramate '50mg'$  twice daily.  She has improved. She still feels a little "off" but much better.    HISTORY: Migraines: She has had migraines since age 82.  They are located bi-frontal, of pounding quality and usually 10/10 intensity.  They are associated with nausea, vomiting, photophobia, phonophobia and blurred vision.  They typically last all day and occur 2 days per week.  They are not preceded by aura.  She often wakes up with it.  There are no known triggers.  Relieving factors include sumatriptan.   Past abortive therapy:  Excedrin (increased blood pressure), Advil, Fioricet, sumatriptan Past preventive therapy:  Nortriptyline    Family history of headache:  2 daughters with migraines.   She also reports vertigo (spinning sensation) since childhood.  It is usually daily and not specifically positional.  She takes meclizine which doesn't always work.   TIA: She was admitted to [facility] from 06/28/16 to 06/29/16 after developing left upper extremity numbness and weakness associated with her typical migraine.  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  She  was admitted.  MRI of brain was personally reviewed and revealed old right basal ganglia lacunar infarct and old small cerebellar infarcts but no acute findings.  MRA of head was negative.  Carotid doppler revealed no hemodynamically significant stenosis.  TTE revealed EF 60-65% with no cardiac source of emboli.  LDL was 191.  Hgb A1c was 6.4.  She is on ASA '81mg'$  daily for secondary stroke prevention.  She was started on Lipitor '40mg'$  daily.  CMP unremarkable.   She reports history of TIAs in the past.  UNSTEADY GAIT: She was seen in the ED on 09/27/17 for dizziness.  She described a spinning sensation and was diagnosed with vertigo.  She was found to be dehydrated.  Labs demonstrated mild anemia.  EKG showed inverted T waves and was referred to cardiology.  Since then, she has not been feeling well.  She is feeling off-balance but it is not associated with vertigo or lightheadedness.  She has had 2 falls.  Her last fall was last week.  She reports increased hiccups.  Sometimes it is hard to focus.  There is no associated neck pain, double vision, dysphagia, unilateral numbness or weakness, numbness in feet.   She tried stopping topiramate, Januvia and atorvastatin for a couple of days and her balance improved.  However, her topiramate was changed back in September.  Her blood pressure medication was changed from losartan to Benicar.  She changed dose time from morning to evening, which helped a little.  Trazodone was switched to clonazepam at bedtime.  She tried stopping clonazepam but it didn't affect her balance.    PAST  MEDICAL HISTORY: Past Medical History:  Diagnosis Date  . Asthma   . Diabetes mellitus without complication ([place])   . Hypertension   . IBS (irritable bowel syndrome)     MEDICATIONS: Current Outpatient Medications on File Prior to Visit  Medication Sig Dispense Refill  . Ascorbic Acid (VITAMIN C) 1000 MG tablet Take 1,000 mg by mouth daily.    Marland Kitchen aspirin 81 MG tablet Take 81 mg  by mouth daily.    Marland Kitchen atorvastatin (LIPITOR) 80 MG tablet Take 1 tablet (80 mg total) by mouth daily. 90 tablet 3  . Blood Glucose Monitoring Suppl (ACCU-CHEK AVIVA PLUS) w/Device KIT Use as directed to check blood sugar once daily. E11.9 1 kit 0  . Blood Pressure Monitoring (BLOOD PRESSURE KIT) DEVI 1 application by Does not apply route daily. Use to check blood pressure daily 1 Device 0  . Cholecalciferol 1000 units tablet Take 1,000 Units by mouth daily.    . clonazePAM (KLONOPIN) 1 MG tablet Take 1 mg by mouth 3 (three) times daily.  0  . COD LIVER OIL PO Take 500 Units by mouth daily.    Marland Kitchen DEXILANT 60 MG capsule Take 60 mg by mouth daily.  3  . diphenhydrAMINE (BENADRYL) 25 MG tablet Take 25 mg by mouth every 6 (six) hours as needed for sleep.    . Ferrous Sulfate (SLOW FE) 142 (45 Fe) MG TBCR Take 1 tablet by mouth daily.    . Fish Oil-Cholecalciferol (FISH OIL + D3) 1000-1000 MG-UNIT CAPS Take 1 tablet by mouth daily.    . fluticasone (FLONASE) 50 MCG/ACT nasal spray SPRAY 2 SPRAYS INTO EACH NOSTRIL EVERY DAY 16 g 2  . gabapentin (NEURONTIN) 300 MG capsule Start with 1 tab po qhs X 1 week, then increase to 1 tab po bid X 1 week then 1 tab po tid prn 90 capsule 1  . glucose blood (ACCU-CHEK AVIVA PLUS) test strip Use as instructed to check blood sugar once daily. E11.9 100 each 12  . guaiFENesin-codeine (ROBITUSSIN AC) 100-10 MG/5ML syrup Take 10 mLs by mouth 3 (three) times daily as needed for cough. 480 mL 1  . JANUVIA 50 MG tablet Take 50 mg by mouth daily.  2  . Lancets (ACCU-CHEK SOFT TOUCH) lancets Use as instructed to check blood sugar once daily E11.9 100 each 12  . linaclotide (LINZESS) 145 MCG CAPS capsule Take 1 capsule (145 mcg total) by mouth daily as needed (FOR IBS). 90 capsule 3  . meclizine (ANTIVERT) 25 MG tablet Take 1 tablet (25 mg total) by mouth every 8 (eight) hours as needed for dizziness. 30 tablet 3  . metFORMIN (GLUCOPHAGE) 500 MG tablet Take 1 tablet (500 mg total)  by mouth 2 (two) times daily with a meal. 180 tablet 3  . Multiple Vitamin (MULTIVITAMIN WITH MINERALS) TABS tablet Take 1 tablet by mouth daily.    . naproxen sodium (ALEVE) 220 MG tablet Take 440 mg by mouth 2 (two) times daily as needed (pain).    Marland Kitchen olmesartan-hydrochlorothiazide (BENICAR HCT) 40-25 MG tablet Take 1 tablet by mouth daily.  3  . ondansetron (ZOFRAN ODT) 4 MG disintegrating tablet Take 1 tablet (4 mg total) by mouth every 8 (eight) hours as needed for nausea or vomiting. 30 tablet 0  . pantoprazole (PROTONIX) 40 MG tablet Take 1 tablet (40 mg total) by mouth daily. 30 tablet 3  . promethazine (PHENERGAN) 12.5 MG tablet Take 12.5-25 mg by mouth every 6 (six) hours as  needed for nausea or vomiting.   4  . topiramate (TOPAMAX) 50 MG tablet Take 1 tablet (50 mg total) by mouth 2 (two) times daily. 60 tablet 5  . traZODone (DESYREL) 100 MG tablet Take 1 tablet (100 mg total) by mouth at bedtime as needed for sleep. 30 tablet 0  . losartan (COZAAR) 50 MG tablet Take 1 tablet (50 mg total) by mouth 2 (two) times daily. (Patient not taking: Reported on 11/29/2017) 60 tablet 2   No current facility-administered medications on file prior to visit.     ALLERGIES: Allergies  Allergen Reactions  . Peanuts [Peanut Oil] Anaphylaxis  . Shrimp [Shellfish Allergy] Anaphylaxis  . Latex Hives  . Penicillins Hives    Has patient had a PCN reaction causing immediate rash, facial/tongue/throat swelling, SOB or lightheadedness with hypotension: No Has patient had a PCN reaction causing severe rash involving mucus membranes or skin necrosis: No Has patient had a PCN reaction that required hospitalization: No Has patient had a PCN reaction occurring within the last 10 years: No  If all of the above answers are "NO", then may proceed with Cephalosporin use.    Marland Kitchen Pineapple     Scratchy throat, Tongue swelling    FAMILY HISTORY: Family History  Problem Relation Age of Onset  . Cancer Maternal  Grandfather   . Kidney failure Mother   . Hypertension Mother   . Heart disease Father   . Breast cancer Neg Hx     SOCIAL HISTORY: Social History   Socioeconomic History  . Marital status: Divorced    Spouse name: Not on file  . Number of children: Not on file  . Years of education: Not on file  . Highest education level: Not on file  Social Needs  . Financial resource strain: Not on file  . Food insecurity - worry: Not on file  . Food insecurity - inability: Not on file  . Transportation needs - medical: Not on file  . Transportation needs - non-medical: Not on file  Occupational History  . Not on file  Tobacco Use  . Smoking status: Former Smoker    Last attempt to quit: 09/22/2012    Years since quitting: 5.1  . Smokeless tobacco: Never Used  Substance and Sexual Activity  . Alcohol use: Yes    Alcohol/week: 0.6 oz    Types: 1 Glasses of wine per week    Comment: red wine 1-2 x month  . Drug use: No  . Sexual activity: Not on file  Other Topics Concern  . Not on file  Social History Narrative  . Not on file    REVIEW OF SYSTEMS: Constitutional: No fevers, chills, or sweats, no generalized fatigue, change in appetite Eyes: No visual changes, double vision, eye pain Ear, nose and throat: No hearing loss, ear pain, nasal congestion, sore throat Cardiovascular: No chest pain, palpitations Respiratory:  No shortness of breath at rest or with exertion, wheezes GastrointestinaI: No nausea, vomiting, diarrhea, abdominal pain, fecal incontinence Genitourinary:  No dysuria, urinary retention or frequency Musculoskeletal:  No neck pain, back pain Integumentary: No rash, pruritus, skin lesions Neurological: as above Psychiatric: No depression, insomnia, anxiety Endocrine: No palpitations, fatigue, diaphoresis, mood swings, change in appetite, change in weight, increased thirst Hematologic/Lymphatic:  No purpura, petechiae. Allergic/Immunologic: no itchy/runny eyes,  nasal congestion, recent allergic reactions, rashes  PHYSICAL EXAM: Vitals:   11/29/17 0736  BP: 118/80  Pulse: 66   General: No acute distress.  Patient appears well-groomed.  Head:  Normocephalic/atraumatic Eyes:  Fundi examined but not visualized Neck: supple, no paraspinal tenderness, full range of motion Heart:  Regular rate and rhythm Lungs:  Clear to auscultation bilaterally Back: No paraspinal tenderness Neurological Exam: Gait overall steady with normal station and stride.  She does slightly and briefly stagger, such as turning around.  Romberg negative.  IMPRESSION: Unsteady gait, improved.  She also is more lucid.  It think her symptoms were related to her medications which were complicated by her history of chronic vertigo.  I would not make any changes to her topiramate since it is effective for her migraines.   Follow up in 6 months.  18 minutes spent face to face with patient, over 90% spent discussing MRI results and diagnosis.  Metta Clines, DO  CC:  Lin Landsman, MD

## 2017-11-29 NOTE — Patient Instructions (Addendum)
You look a lot better.  It may be a combination of your other medications and your chronic vertigo.  I think the symptoms were not related to the topamax so we won't make any changes.  Follow up in 6 months.

## 2017-12-01 DIAGNOSIS — I10 Essential (primary) hypertension: Secondary | ICD-10-CM | POA: Diagnosis not present

## 2017-12-01 DIAGNOSIS — Z7984 Long term (current) use of oral hypoglycemic drugs: Secondary | ICD-10-CM | POA: Diagnosis not present

## 2017-12-01 DIAGNOSIS — R9439 Abnormal result of other cardiovascular function study: Secondary | ICD-10-CM | POA: Diagnosis not present

## 2017-12-01 DIAGNOSIS — D649 Anemia, unspecified: Secondary | ICD-10-CM | POA: Diagnosis not present

## 2017-12-01 DIAGNOSIS — R079 Chest pain, unspecified: Secondary | ICD-10-CM | POA: Diagnosis not present

## 2017-12-01 DIAGNOSIS — G43909 Migraine, unspecified, not intractable, without status migrainosus: Secondary | ICD-10-CM | POA: Diagnosis not present

## 2017-12-01 DIAGNOSIS — E119 Type 2 diabetes mellitus without complications: Secondary | ICD-10-CM | POA: Diagnosis not present

## 2017-12-15 DIAGNOSIS — E669 Obesity, unspecified: Secondary | ICD-10-CM | POA: Diagnosis not present

## 2017-12-15 DIAGNOSIS — R0789 Other chest pain: Secondary | ICD-10-CM | POA: Diagnosis not present

## 2017-12-15 DIAGNOSIS — E785 Hyperlipidemia, unspecified: Secondary | ICD-10-CM | POA: Diagnosis not present

## 2017-12-15 DIAGNOSIS — E119 Type 2 diabetes mellitus without complications: Secondary | ICD-10-CM | POA: Diagnosis not present

## 2017-12-29 DIAGNOSIS — I10 Essential (primary) hypertension: Secondary | ICD-10-CM | POA: Diagnosis not present

## 2017-12-29 DIAGNOSIS — R0789 Other chest pain: Secondary | ICD-10-CM | POA: Diagnosis not present

## 2017-12-29 DIAGNOSIS — E119 Type 2 diabetes mellitus without complications: Secondary | ICD-10-CM | POA: Diagnosis not present

## 2017-12-29 NOTE — Progress Notes (Signed)
R[facility]d [facility] [place], Dr. Kallie EdwardPankaj Parikh. For last OV note and MRI report. Faxed to [phone]

## 2018-01-08 DIAGNOSIS — I10 Essential (primary) hypertension: Secondary | ICD-10-CM | POA: Diagnosis not present

## 2018-01-08 DIAGNOSIS — I6789 Other cerebrovascular disease: Secondary | ICD-10-CM | POA: Diagnosis not present

## 2018-01-08 DIAGNOSIS — R0683 Snoring: Secondary | ICD-10-CM | POA: Diagnosis not present

## 2018-01-08 DIAGNOSIS — H9012 Conductive hearing loss, unilateral, left ear, with unrestricted hearing on the contralateral side: Secondary | ICD-10-CM | POA: Diagnosis not present

## 2018-01-12 DIAGNOSIS — I10 Essential (primary) hypertension: Secondary | ICD-10-CM | POA: Diagnosis not present

## 2018-01-12 DIAGNOSIS — R0789 Other chest pain: Secondary | ICD-10-CM | POA: Diagnosis not present

## 2018-01-12 DIAGNOSIS — E119 Type 2 diabetes mellitus without complications: Secondary | ICD-10-CM | POA: Diagnosis not present

## 2018-01-12 DIAGNOSIS — E785 Hyperlipidemia, unspecified: Secondary | ICD-10-CM | POA: Diagnosis not present

## 2018-01-17 ENCOUNTER — Other Ambulatory Visit: Payer: Self-pay | Admitting: Internal Medicine

## 2018-01-17 ENCOUNTER — Other Ambulatory Visit (HOSPITAL_BASED_OUTPATIENT_CLINIC_OR_DEPARTMENT_OTHER): Payer: Self-pay

## 2018-01-17 DIAGNOSIS — R5383 Other fatigue: Secondary | ICD-10-CM

## 2018-01-17 DIAGNOSIS — G4700 Insomnia, unspecified: Secondary | ICD-10-CM

## 2018-01-17 DIAGNOSIS — J069 Acute upper respiratory infection, unspecified: Secondary | ICD-10-CM

## 2018-01-17 DIAGNOSIS — R0683 Snoring: Secondary | ICD-10-CM

## 2018-01-18 ENCOUNTER — Encounter: Payer: Self-pay | Admitting: Neurology

## 2018-01-23 ENCOUNTER — Other Ambulatory Visit: Payer: Self-pay | Admitting: Neurology

## 2018-02-01 DIAGNOSIS — I10 Essential (primary) hypertension: Secondary | ICD-10-CM | POA: Diagnosis not present

## 2018-02-01 DIAGNOSIS — E1165 Type 2 diabetes mellitus with hyperglycemia: Secondary | ICD-10-CM | POA: Diagnosis not present

## 2018-02-14 ENCOUNTER — Ambulatory Visit (HOSPITAL_BASED_OUTPATIENT_CLINIC_OR_DEPARTMENT_OTHER): Payer: Medicare Other

## 2018-02-16 DIAGNOSIS — E119 Type 2 diabetes mellitus without complications: Secondary | ICD-10-CM | POA: Diagnosis not present

## 2018-02-16 DIAGNOSIS — I10 Essential (primary) hypertension: Secondary | ICD-10-CM | POA: Diagnosis not present

## 2018-02-16 DIAGNOSIS — I200 Unstable angina: Secondary | ICD-10-CM | POA: Diagnosis not present

## 2018-02-21 ENCOUNTER — Encounter (HOSPITAL_BASED_OUTPATIENT_CLINIC_OR_DEPARTMENT_OTHER): Payer: Medicare Other

## 2018-03-23 ENCOUNTER — Encounter (HOSPITAL_BASED_OUTPATIENT_CLINIC_OR_DEPARTMENT_OTHER): Payer: Medicare Other

## 2018-04-03 ENCOUNTER — Ambulatory Visit (INDEPENDENT_AMBULATORY_CARE_PROVIDER_SITE_OTHER): Payer: Medicare Other | Admitting: Sports Medicine

## 2018-04-03 ENCOUNTER — Ambulatory Visit (INDEPENDENT_AMBULATORY_CARE_PROVIDER_SITE_OTHER): Payer: Medicare Other

## 2018-04-03 ENCOUNTER — Encounter: Payer: Self-pay | Admitting: Sports Medicine

## 2018-04-03 VITALS — BP 148/100 | HR 70 | Temp 98.6°F | Resp 16 | Ht 67.0 in | Wt 192.0 lb

## 2018-04-03 DIAGNOSIS — L603 Nail dystrophy: Secondary | ICD-10-CM | POA: Diagnosis not present

## 2018-04-03 DIAGNOSIS — I739 Peripheral vascular disease, unspecified: Secondary | ICD-10-CM

## 2018-04-03 DIAGNOSIS — M25476 Effusion, unspecified foot: Secondary | ICD-10-CM

## 2018-04-03 DIAGNOSIS — M25473 Effusion, unspecified ankle: Secondary | ICD-10-CM

## 2018-04-03 DIAGNOSIS — M25475 Effusion, left foot: Secondary | ICD-10-CM

## 2018-04-03 DIAGNOSIS — M792 Neuralgia and neuritis, unspecified: Secondary | ICD-10-CM

## 2018-04-03 DIAGNOSIS — M79675 Pain in left toe(s): Secondary | ICD-10-CM | POA: Diagnosis not present

## 2018-04-03 NOTE — Progress Notes (Signed)
   Subjective:    Patient ID: Julie Gilbert, female    DOB: [birth year], 67 y.o.   MRN: [id number]  HPI    Review of Systems  All other systems reviewed and are negative.      Objective:   Physical Exam        Assessment & Plan:

## 2018-04-03 NOTE — Progress Notes (Signed)
Subjective: Julie Gilbert is a 67 y.o. female patient seen today in office with complaint of mildly painful left hallux medial nail margin x 2 years 10/10 with numbness and shooting pain to the knee that started after previous ingrown nail procedure. Patient denies history of Diabetes, Neuropathy, or Vascular disease however does has swelling to ankle. Patient has no other pedal complaints at this time.   Review of Systems  All other systems reviewed and are negative.   Patient Active Problem List   Diagnosis Date Noted  . Herniated intervertebral disc of lumbar spine 05/01/2017  . Bilateral leg pain 02/03/2017  . Osteoarthritis of spine 02/03/2017  . Moderate episode of recurrent major depressive disorder ([place]) 01/04/2017  . Infective urethritis 01/04/2017  . Colon cancer screening 01/04/2017  . Patient left before evaluation by physician 12/24/2016  . Chronic anemia 06/29/2016  . TIA (transient ischemic attack) 06/29/2016  . Transient ischemic attack (TIA) 06/28/2016  . Arthralgia 08/12/2015  . Gastroesophageal reflux disease without esophagitis 06/18/2015  . History of migraine headaches 05/13/2015  . Essential hypertension 05/13/2015  . Diabetes mellitus type 2, controlled ([place]) 05/13/2015  . IBS (irritable bowel syndrome) 05/13/2015  . Migraine headache 05/13/2015  . Depression 05/13/2015  . Insomnia 05/13/2015    Current Outpatient Medications on File Prior to Visit  Medication Sig Dispense Refill  . Ascorbic Acid (VITAMIN C) 1000 MG tablet Take 1,000 mg by mouth daily.    Marland Kitchen aspirin 81 MG tablet Take 81 mg by mouth daily.    Marland Kitchen atorvastatin (LIPITOR) 80 MG tablet Take 1 tablet (80 mg total) by mouth daily. 90 tablet 3  . Blood Glucose Monitoring Suppl (ACCU-CHEK AVIVA PLUS) w/Device KIT Use as directed to check blood sugar once daily. E11.9 1 kit 0  . Blood Pressure Monitoring (BLOOD PRESSURE KIT) DEVI 1 application by Does not apply route daily. Use to check blood pressure  daily 1 Device 0  . Cholecalciferol 1000 units tablet Take 1,000 Units by mouth daily.    . clonazePAM (KLONOPIN) 1 MG tablet Take 1 mg by mouth 3 (three) times daily.  0  . COD LIVER OIL PO Take 500 Units by mouth daily.    Marland Kitchen DEXILANT 60 MG capsule Take 60 mg by mouth daily.  3  . diphenhydrAMINE (BENADRYL) 25 MG tablet Take 25 mg by mouth every 6 (six) hours as needed for sleep.    . Ferrous Sulfate (SLOW FE) 142 (45 Fe) MG TBCR Take 1 tablet by mouth daily.    . Fish Oil-Cholecalciferol (FISH OIL + D3) 1000-1000 MG-UNIT CAPS Take 1 tablet by mouth daily.    . fluticasone (FLONASE) 50 MCG/ACT nasal spray SPRAY 2 SPRAYS INTO EACH NOSTRIL EVERY DAY 16 g 2  . gabapentin (NEURONTIN) 300 MG capsule Start with 1 tab po qhs X 1 week, then increase to 1 tab po bid X 1 week then 1 tab po tid prn 90 capsule 1  . glucose blood (ACCU-CHEK AVIVA PLUS) test strip Use as instructed to check blood sugar once daily. E11.9 100 each 12  . guaiFENesin-codeine (ROBITUSSIN AC) 100-10 MG/5ML syrup Take 10 mLs by mouth 3 (three) times daily as needed for cough. 480 mL 1  . JANUVIA 50 MG tablet Take 50 mg by mouth daily.  2  . Lancets (ACCU-CHEK SOFT TOUCH) lancets Use as instructed to check blood sugar once daily E11.9 100 each 12  . linaclotide (LINZESS) 145 MCG CAPS capsule Take 1 capsule (145 mcg total)  by mouth daily as needed (FOR IBS). 90 capsule 3  . losartan (COZAAR) 50 MG tablet Take 1 tablet (50 mg total) by mouth 2 (two) times daily. 60 tablet 2  . meclizine (ANTIVERT) 25 MG tablet Take 1 tablet (25 mg total) by mouth every 8 (eight) hours as needed for dizziness. 30 tablet 3  . metFORMIN (GLUCOPHAGE) 500 MG tablet Take 1 tablet (500 mg total) by mouth 2 (two) times daily with a meal. 180 tablet 3  . Multiple Vitamin (MULTIVITAMIN WITH MINERALS) TABS tablet Take 1 tablet by mouth daily.    . naproxen sodium (ALEVE) 220 MG tablet Take 440 mg by mouth 2 (two) times daily as needed (pain).    Marland Kitchen  olmesartan-hydrochlorothiazide (BENICAR HCT) 40-25 MG tablet Take 1 tablet by mouth daily.  3  . ondansetron (ZOFRAN ODT) 4 MG disintegrating tablet Take 1 tablet (4 mg total) by mouth every 8 (eight) hours as needed for nausea or vomiting. 30 tablet 0  . pantoprazole (PROTONIX) 40 MG tablet Take 1 tablet (40 mg total) by mouth daily. 30 tablet 3  . promethazine (PHENERGAN) 12.5 MG tablet Take 12.5-25 mg by mouth every 6 (six) hours as needed for nausea or vomiting.   4  . topiramate (TOPAMAX) 50 MG tablet TAKE 1 TABLET BY MOUTH TWICE A DAY 60 tablet 5  . traZODone (DESYREL) 100 MG tablet Take 1 tablet (100 mg total) by mouth at bedtime as needed for sleep. 30 tablet 0   No current facility-administered medications on file prior to visit.     Allergies  Allergen Reactions  . Peanuts [Peanut Oil] Anaphylaxis  . Shrimp [Shellfish Allergy] Anaphylaxis  . Latex Hives  . Penicillins Hives    Has patient had a PCN reaction causing immediate rash, facial/tongue/throat swelling, SOB or lightheadedness with hypotension: No Has patient had a PCN reaction causing severe rash involving mucus membranes or skin necrosis: No Has patient had a PCN reaction that required hospitalization: No Has patient had a PCN reaction occurring within the last 10 years: No  If all of the above answers are "NO", then may proceed with Cephalosporin use.    Marland Kitchen Pineapple     Scratchy throat, Tongue swelling    Objective: Physical Exam  General: Well developed, nourished, no acute distress, awake, alert and oriented x 3  Vascular: Dorsalis pedis artery 1/4 bilateral, Posterior tibial artery 0/4 bilateral, skin temperature warm to warm proximal to distal bilateral lower extremities, trace edema bilateral ankles, no varicosities, scant pedal hair present bilateral.  Neurological: Gross sensation present via light touch bilateral. Subjective shooting pain from 1st toe on left.   Dermatological: Skin is warm, dry, and  supple bilateral, left hallux medial margin with nail fragment at medial margin with mild subungal debris, no webspace macerations present bilateral, no open lesions present bilateral, no callus/corns/hyperkeratotic tissue present bilateral. No signs of infection bilateral.  Musculoskeletal: Pain to toe with palpation on left, No symptomatic boney deformities noted bilateral. Muscular strength within normal limits without pain on range of motion. No pain with calf compression bilateral.  Xray, left no acute findings at areas of concern.    Assessment and Plan:  Problem List Items Addressed This Visit    None    Visit Diagnoses    Toe pain, left    -  Primary   Relevant Orders   DG Foot Complete Left   Neuritis       Nail dystrophy  Bilateral swelling of feet and ankles       PAD (peripheral artery disease) (HCC)       Relevant Orders   POCT ABI Screening for [place] No Charge     -Examined patient -X-rays reviewed -Discussed treatment options for painful dystrophic nail with neuritis -Mechanically debrided and reduced dystrophic left 1st toenail with sterile nail nipper and dremel nail file without incident.  -ABI 1.12  -Patient to obtain previous doctors notes for me to do from previous procedure; advised patient that pain appears out of proportion  -Patient to return as needed or sooner if symptoms worsen.  Landis Martins, DPM

## 2018-04-04 ENCOUNTER — Other Ambulatory Visit: Payer: Self-pay | Admitting: Sports Medicine

## 2018-04-04 DIAGNOSIS — M79675 Pain in left toe(s): Secondary | ICD-10-CM

## 2018-04-04 DIAGNOSIS — M25475 Effusion, left foot: Secondary | ICD-10-CM

## 2018-04-04 DIAGNOSIS — M25476 Effusion, unspecified foot: Secondary | ICD-10-CM

## 2018-04-04 DIAGNOSIS — M25473 Effusion, unspecified ankle: Secondary | ICD-10-CM

## 2018-04-04 DIAGNOSIS — M792 Neuralgia and neuritis, unspecified: Secondary | ICD-10-CM

## 2018-04-04 DIAGNOSIS — L603 Nail dystrophy: Secondary | ICD-10-CM

## 2018-04-04 DIAGNOSIS — I739 Peripheral vascular disease, unspecified: Secondary | ICD-10-CM

## 2018-04-18 ENCOUNTER — Other Ambulatory Visit: Payer: Self-pay | Admitting: Family Medicine

## 2018-04-18 DIAGNOSIS — Z1231 Encounter for screening mammogram for malignant neoplasm of breast: Secondary | ICD-10-CM

## 2018-05-18 ENCOUNTER — Ambulatory Visit (INDEPENDENT_AMBULATORY_CARE_PROVIDER_SITE_OTHER): Payer: Medicare Other | Admitting: Family Medicine

## 2018-05-18 ENCOUNTER — Encounter: Payer: Self-pay | Admitting: Family Medicine

## 2018-05-18 VITALS — BP 138/78 | HR 57 | Temp 97.8°F | Ht 67.0 in | Wt 195.0 lb

## 2018-05-18 DIAGNOSIS — E1159 Type 2 diabetes mellitus with other circulatory complications: Secondary | ICD-10-CM

## 2018-05-18 DIAGNOSIS — N183 Chronic kidney disease, stage 3 unspecified: Secondary | ICD-10-CM

## 2018-05-18 DIAGNOSIS — I10 Essential (primary) hypertension: Secondary | ICD-10-CM

## 2018-05-18 DIAGNOSIS — Z1231 Encounter for screening mammogram for malignant neoplasm of breast: Secondary | ICD-10-CM | POA: Diagnosis not present

## 2018-05-18 DIAGNOSIS — I152 Hypertension secondary to endocrine disorders: Secondary | ICD-10-CM

## 2018-05-18 DIAGNOSIS — D649 Anemia, unspecified: Secondary | ICD-10-CM | POA: Diagnosis not present

## 2018-05-18 DIAGNOSIS — F419 Anxiety disorder, unspecified: Secondary | ICD-10-CM

## 2018-05-18 DIAGNOSIS — E1169 Type 2 diabetes mellitus with other specified complication: Secondary | ICD-10-CM

## 2018-05-18 DIAGNOSIS — E785 Hyperlipidemia, unspecified: Secondary | ICD-10-CM

## 2018-05-18 DIAGNOSIS — E1149 Type 2 diabetes mellitus with other diabetic neurological complication: Secondary | ICD-10-CM

## 2018-05-18 DIAGNOSIS — Z1239 Encounter for other screening for malignant neoplasm of breast: Secondary | ICD-10-CM

## 2018-05-18 DIAGNOSIS — F331 Major depressive disorder, recurrent, moderate: Secondary | ICD-10-CM

## 2018-05-18 DIAGNOSIS — E1122 Type 2 diabetes mellitus with diabetic chronic kidney disease: Secondary | ICD-10-CM

## 2018-05-18 HISTORY — DX: Type 2 diabetes mellitus with other specified complication: E11.69

## 2018-05-18 HISTORY — DX: Type 2 diabetes mellitus with diabetic chronic kidney disease: E11.22

## 2018-05-18 HISTORY — DX: Anxiety disorder, unspecified: F41.9

## 2018-05-18 HISTORY — DX: Chronic kidney disease, stage 3 unspecified: N18.30

## 2018-05-18 HISTORY — DX: Hyperlipidemia, unspecified: E78.5

## 2018-05-18 MED ORDER — ESCITALOPRAM OXALATE 10 MG PO TABS: 10.0000 mg | ORAL_TABLET | Freq: Every day | ORAL | 1 refills | Status: DC

## 2018-05-18 NOTE — Assessment & Plan Note (Signed)
Unclear underlying etiology.  Will obtain records from her previous PCP.  Based on blood work from earlier this year, it appears she has some component of CKD which could be contributing.  Will place referral to hematology for further evaluation per patient request.

## 2018-05-18 NOTE — Assessment & Plan Note (Signed)
Continue Januvia 50 mg daily.  Obtain records from previous PCP.  Follow-up with me in 3 months.

## 2018-05-18 NOTE — Patient Instructions (Signed)
It was very nice to see you today!  Please start the lexapro.  I place referrals to hematology and for your mammogram.  Come back to see me in 3 months, or sooner as needed.  Take care, Dr Jimmey RalphParker

## 2018-05-18 NOTE — Progress Notes (Signed)
Subjective:  Julie Gilbert is a 67 y.o. female who presents today with a chief complaint of normocytic anemia and to establish care.   HPI:  Normocytic Anemia, chronic problem, new to provider Managed by her previous PCP. Has been on iron supplementation in the past but did not tolerate due to constipation. Has also been on B12 replacement injections without significant improvement. She would like to be referred to a hematologist for further management. She has a family history of thalassemia and sickle cell disease.   HTN, chronic problem, new to provider Currently on amlodipine 6m daily and imdur 352mdaily. Tolerating well without side effects. No reported chest pain or shortness.   HLD, chronic problem, new to provider Several year history. Stable on lipitor 8043maily.  T2DM, chronic problem, new to provder On januvia. Reports last A1c was 6. Symptoms are stable.   Depression/Anxiety, chronic problems Currently takes clonazepam 1mg61md prn. She has never been on any other medications. Symptoms are tolerable. No reported SI or HI.   Asthma, chronic problem, new to provider Severe history.  Currently on Symbicort and albuterol as needed.  Symptoms are stable.  ROS: Per HPI, otherwise a complete review of systems was negative.   PMH:  The following were reviewed and entered/updated in epic: Past Medical History:  Diagnosis Date  . Asthma   . Diabetes mellitus without complication (HCC)[place]. Hypertension   . IBS (irritable bowel syndrome)    Patient Active Problem List   Diagnosis Date Noted  . Dyslipidemia associated with type 2 diabetes mellitus (HCC)[place]/23/2019  . Anxiety 05/18/2018  . CKD stage 3 due to type 2 diabetes mellitus (HCC)[place]/23/2019  . Herniated intervertebral disc of lumbar spine 05/01/2017  . Osteoarthritis of spine 02/03/2017  . Moderate episode of recurrent major depressive disorder (HCC)[place]/07/2017  . Chronic anemia 06/29/2016  . Transient  ischemic attack (TIA) 06/28/2016  . Arthralgia 08/12/2015  . Gastroesophageal reflux disease without esophagitis 06/18/2015  . Hypertension associated with diabetes (HCC)[place]/17/2016  . Type 2 diabetes mellitus with neurological complications (HCC)[place]/129/92/4268IBS (irritable bowel syndrome) 05/13/2015  . Migraine headache 05/13/2015  . Insomnia 05/13/2015   Past Surgical History:  Procedure Laterality Date  . CESAREAN SECTION    . EXPLORATORY LAPAROTOMY      Family History  Problem Relation Age of Onset  . Cancer Maternal Grandfather   . Kidney failure Mother   . Hypertension Mother   . Heart disease Father   . Breast cancer Neg Hx     Medications- reviewed and updated Current Outpatient Medications  Medication Sig Dispense Refill  . amLODipine (NORVASC) 5 MG tablet Take 5 mg by mouth daily.    . Ascorbic Acid (VITAMIN C) 1000 MG tablet Take 1,000 mg by mouth daily.    . asMarland Kitchenirin 81 MG tablet Take 81 mg by mouth daily.    . atMarland Kitchenrvastatin (LIPITOR) 80 MG tablet Take 1 tablet (80 mg total) by mouth daily. 90 tablet 3  . Blood Glucose Monitoring Suppl (ACCU-CHEK AVIVA PLUS) w/Device KIT Use as directed to check blood sugar once daily. E11.9 1 kit 0  . Blood Pressure Monitoring (BLOOD PRESSURE KIT) DEVI 1 application by Does not apply route daily. Use to check blood pressure daily 1 Device 0  . budesonide-formoterol (SYMBICORT) 80-4.5 MCG/ACT inhaler Inhale 2 puffs into the lungs 2 (two) times daily.    . Cholecalciferol 1000 units tablet Take 1,000 Units by mouth daily.    .Marland Kitchen  clonazePAM (KLONOPIN) 1 MG tablet Take 1 mg by mouth 3 (three) times daily.  0  . COD LIVER OIL PO Take 500 Units by mouth daily.    . cyanocobalamin (,VITAMIN B-12,) 1000 MCG/ML injection Inject 1,000 mcg into the muscle once.    Marland Kitchen DEXILANT 60 MG capsule Take 60 mg by mouth daily.  3  . diphenhydrAMINE (BENADRYL) 25 MG tablet Take 25 mg by mouth every 6 (six) hours as needed for sleep.    . Fish  Oil-Cholecalciferol (FISH OIL + D3) 1000-1000 MG-UNIT CAPS Take 1 tablet by mouth daily.    . fluticasone (FLONASE) 50 MCG/ACT nasal spray SPRAY 2 SPRAYS INTO EACH NOSTRIL EVERY DAY 16 g 2  . glucose blood (ACCU-CHEK AVIVA PLUS) test strip Use as instructed to check blood sugar once daily. E11.9 100 each 12  . isosorbide mononitrate (IMDUR) 30 MG 24 hr tablet Take 30 mg by mouth daily.    Marland Kitchen JANUVIA 50 MG tablet Take 50 mg by mouth daily.  2  . Lancets (ACCU-CHEK SOFT TOUCH) lancets Use as instructed to check blood sugar once daily E11.9 100 each 12  . linaclotide (LINZESS) 145 MCG CAPS capsule Take 1 capsule (145 mcg total) by mouth daily as needed (FOR IBS). 90 capsule 3  . meclizine (ANTIVERT) 25 MG tablet Take 1 tablet (25 mg total) by mouth every 8 (eight) hours as needed for dizziness. 30 tablet 3  . Multiple Vitamin (MULTIVITAMIN WITH MINERALS) TABS tablet Take 1 tablet by mouth daily.    . ondansetron (ZOFRAN ODT) 4 MG disintegrating tablet Take 1 tablet (4 mg total) by mouth every 8 (eight) hours as needed for nausea or vomiting. 30 tablet 0  . promethazine (PHENERGAN) 12.5 MG tablet Take 12.5-25 mg by mouth every 6 (six) hours as needed for nausea or vomiting.   4  . topiramate (TOPAMAX) 50 MG tablet TAKE 1 TABLET BY MOUTH TWICE A DAY 60 tablet 5  . escitalopram (LEXAPRO) 10 MG tablet Take 1 tablet (10 mg total) by mouth daily. 90 tablet 1   No current facility-administered medications for this visit.     Allergies-reviewed and updated Allergies  Allergen Reactions  . Peanuts [Peanut Oil] Anaphylaxis  . Shrimp [Shellfish Allergy] Anaphylaxis  . Latex Hives  . Penicillins Hives    Has patient had a PCN reaction causing immediate rash, facial/tongue/throat swelling, SOB or lightheadedness with hypotension: No Has patient had a PCN reaction causing severe rash involving mucus membranes or skin necrosis: No Has patient had a PCN reaction that required hospitalization: No Has patient  had a PCN reaction occurring within the last 10 years: No  If all of the above answers are "NO", then may proceed with Cephalosporin use.    Marland Kitchen Pineapple     Scratchy throat, Tongue swelling    Social History   Socioeconomic History  . Marital status: Divorced    Spouse name: Not on file  . Number of children: Not on file  . Years of education: Not on file  . Highest education level: Not on file  Occupational History  . Not on file  Social Needs  . Financial resource strain: Not on file  . Food insecurity:    Worry: Not on file    Inability: Not on file  . Transportation needs:    Medical: Not on file    Non-medical: Not on file  Tobacco Use  . Smoking status: Former Smoker    Last attempt to quit:  09/22/2012    Years since quitting: 5.6  . Smokeless tobacco: Never Used  Substance and Sexual Activity  . Alcohol use: Yes    Alcohol/week: 1.0 standard drinks    Types: 1 Glasses of wine per week    Comment: red wine 1-2 x month  . Drug use: No  . Sexual activity: Not on file  Lifestyle  . Physical activity:    Days per week: Not on file    Minutes per session: Not on file  . Stress: Not on file  Relationships  . Social connections:    Talks on phone: Not on file    Gets together: Not on file    Attends religious service: Not on file    Active member of club or organization: Not on file    Attends meetings of clubs or organizations: Not on file    Relationship status: Not on file  Other Topics Concern  . Not on file  Social History Narrative  . Not on file    Objective:  Physical Exam: BP 138/78 (BP Location: Right Arm, Patient Position: Sitting, Cuff Size: Normal)   Pulse (!) 57   Temp 97.8 F (36.6 C) (Oral)   Ht _0  (1.702 m)   Wt 195 lb (88.5 kg)   LMP  (LMP Unknown) Comment: Menopausal  SpO2 96%   BMI 30.54 kg/m   Gen: NAD, resting comfortably CV: RRR with no murmurs appreciated Pulm: NWOB, CTAB with no crackles, wheezes, or rhonchi GI:  Normal bowel sounds present. Soft, Nontender, Nondistended. MSK: No edema, cyanosis, or clubbing noted Skin: Warm, dry Neuro: Grossly normal, moves all extremities Psych: Normal affect and thought content  Assessment/Plan:  Type 2 diabetes mellitus with neurological complications (HCC) Continue Januvia 50 mg daily.  Obtain records from previous PCP.  Follow-up with me in 3 months.  Moderate episode of recurrent major depressive disorder (HCC) Start Lexapro 10 mg daily.  Follow up with me in 3 months.  Hypertension associated with diabetes ([place]) At goal.  Continue Norvasc 5 mg daily and Imdur 30 mg daily.  Dyslipidemia associated with type 2 diabetes mellitus (HCC) Continue Lipitor 80 mg daily.  Obtain records from previous PCP.  Chronic anemia Unclear underlying etiology.  Will obtain records from her previous PCP.  Based on blood work from earlier this year, it appears she has some component of CKD which could be contributing.  Will place referral to hematology for further evaluation per patient request.  Anxiety Stable.  Continue clonazepam.  Will start Celexa 10 mg daily.  Follow-up with me in 3 months.  CKD stage 3 due to type 2 diabetes mellitus ([place]) Obtain records from previous PCP.  Continue with risk factor modification.  Will need BMET with next blood draw.  Preventative healthcare Obtain records from previous PCP.  Will place order for mammogram.  Algis Greenhouse. Jerline Pain, MD 05/18/2018 2:38 PM

## 2018-05-18 NOTE — Assessment & Plan Note (Addendum)
Start Lexapro 10 mg daily.  Follow up with me in 3 months.

## 2018-05-18 NOTE — Assessment & Plan Note (Signed)
Obtain records from previous PCP.  Continue with risk factor modification.  Will need BMET with next blood draw.

## 2018-05-18 NOTE — Assessment & Plan Note (Signed)
At goal.  Continue Norvasc 5 mg daily and Imdur 30 mg daily. 

## 2018-05-18 NOTE — Assessment & Plan Note (Signed)
Stable.  Continue clonazepam.  Will start Celexa 10 mg daily.  Follow-up with me in 3 months.

## 2018-05-18 NOTE — Assessment & Plan Note (Signed)
Continue Lipitor 80 mg daily.  Obtain records from previous PCP.

## 2018-05-21 ENCOUNTER — Encounter: Payer: Self-pay | Admitting: Internal Medicine

## 2018-05-21 ENCOUNTER — Telehealth: Payer: Self-pay | Admitting: Internal Medicine

## 2018-05-21 NOTE — Telephone Encounter (Signed)
New referral received from Dr. Jimmey RalphParker for chronic anemia. I noticed there weren't any recent labs on the patient. She stated she was being treated for B12 deficiency by Dr. Pecola Leisureeese at [facility] and changed PCPs a month ago. I called [facility], spoke to [place] to request the pt's most recent labs and office notes. I provided [place] with my phone and fax number. Pt has been scheduled to see Dr. Arbutus PedMohamed on 9/12 at 1130am. Pt aware to arrive 30 minutes early. Letter mailed.

## 2018-05-28 ENCOUNTER — Other Ambulatory Visit: Payer: Self-pay | Admitting: Neurology

## 2018-05-30 NOTE — Progress Notes (Signed)
NEUROLOGY FOLLOW UP OFFICE NOTE  Julie Gilbert [id number]  HISTORY OF PRESENT ILLNESS: Julie Gilbert is a 67 year old right-handed female with hypertension, type 2 diabetes mellitus, migraine and chronic anemia who follows up for migraine, TIA and ataxia.  She is accompanied by her daughter who supplements history.  I  MIGRAINE: History: She has had migraines since age 30.  They are located bi-frontal, of pounding quality and usually 10/10 intensity.  They are associated with nausea, vomiting, photophobia, phonophobia, and blurred vision.  There is no associated unilateral numbness or weakness..  They typically last all day and occur 2 days per week.  They are not preceded by aura.  She often wakes up with it.  There are no known triggers.  Relieving factors include sumatriptan.  Past abortive therapy:  Excedrin (increased blood pressure), Advil, Fioricet, sumatriptan Past preventive therapy:  Nortriptyline   Family history of headache:  2 daughters with migraines.  Update: Intensity:  moderate Duration:  Less than an hour Frequency:  Usually none but she had 2 last month Abortive therapy:  Benadryl Frequency of abortive medication: infrequent.   Current NSAIDS:  no Current analgesics:  no Current triptans:  no Current ergotamine:  no Current anti-emetic:  Zofran ODT '4mg'$  Current muscle relaxants:  no Current anti-anxiolytic:  no Current sleep aide:  no Current Antihypertensive medications:  Benicar Current Antidepressant medications:  no Current Anticonvulsant medications:  topiramate '50mg'$  twice daily Current anti-CGRP:  no Current Vitamins/Herbal/Supplements:  Fish oil, Centrum, C Current Antihistamines/Decongestants:  Benadryl Other therapy:  no  Caffeine:  1 cup coffee daily Alcohol:  no Smoker:  no Diet:  hydrates Exercise:  no Depression:  no; Anxiety:  no Other pain:  no Sleep hygiene:  okay   II  ATAXIA History: She was seen in the ED on 09/27/17 for  dizziness.  She described a spinning sensation and was diagnosed with vertigo.  She was found to be dehydrated.  Labs demonstrated mild anemia.  EKG showed inverted T waves and was referred to cardiology.  Since then, she has not been feeling well.  She is feeling off-balance but it is not associated with vertigo or lightheadedness.  She started having unsteady gait with falls  She reports increased hiccups.  Sometimes it is hard to focus.  There is no associated neck pain, double vision, dysphagia, unilateral numbness or weakness, numbness in feet.  MRI of brain with and without contrast from 11/28/17 showed stable mild to moderate chronic small vessel ischemic changes but no acute abnormality.  Her daughter changed her medications to the evening, which has improved the unsteady gait, although she but still feel a little "off".    She reports vertigo (spinning sensation) since childhood.  It is usually daily and not specifically positional.  She takes meclizine which doesn't always work  Update:  She still feels unsteady if she bends over  III TIA TIA: She was admitted to [facility] from 06/28/16 to 06/29/16 after developing left upper extremity numbness and weakness associated with her typical migraine.  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  She was admitted.  MRI of brain was personally reviewed and revealed old right basal ganglia lacunar infarct and old small cerebellar infarcts but no acute findings.  MRA of head was negative.  Carotid doppler revealed no hemodynamically significant stenosis.  TTE revealed EF 60-65% with no cardiac source of emboli.  LDL was 191.  Hgb A1c was 6.4.  She is on ASA '81mg'$  daily for secondary stroke prevention.  She was started on Lipitor '40mg'$  daily.  CMP  unremarkable.  She reports history of TIAs in the past.  Update: In October, LDL was 94, so I increased Lipitor from '40mg'$  to '80mg'$ .  Recently, her cardiologist reduced dose back to '40mg'$ .  I do not have lab results.  PAST MEDICAL HISTORY: Past Medical History:  Diagnosis Date  . Asthma   . Diabetes mellitus without complication ([place])   . Hypertension   . IBS (irritable bowel syndrome)     MEDICATIONS: Current Outpatient Medications on File Prior to Visit  Medication Sig Dispense Refill  . amLODipine (NORVASC) 5 MG tablet Take 5 mg by mouth daily.    . Ascorbic Acid (VITAMIN C) 1000 MG tablet Take 1,000 mg by mouth daily.    Marland Kitchen aspirin 81 MG tablet Take 81 mg by mouth daily.    Marland Kitchen atorvastatin (LIPITOR) 80 MG tablet Take 1 tablet (80 mg total) by mouth daily. 90 tablet 3  . Blood Glucose Monitoring Suppl (ACCU-CHEK AVIVA PLUS) w/Device KIT Use as directed to check blood sugar once daily. E11.9 1 kit 0  . Blood Pressure Monitoring (BLOOD PRESSURE KIT) DEVI 1 application by Does not apply route daily. Use to check blood pressure daily 1 Device 0  . budesonide-formoterol (SYMBICORT) 80-4.5 MCG/ACT inhaler Inhale 2 puffs into the lungs 2 (two) times daily.    . Cholecalciferol 1000 units tablet Take 1,000 Units by mouth daily.    . clonazePAM (KLONOPIN) 1 MG tablet Take 1 mg by mouth 3 (three) times daily.  0  . COD LIVER OIL PO Take 500 Units by mouth daily.    . cyanocobalamin (,VITAMIN B-12,) 1000 MCG/ML injection Inject 1,000 mcg into the muscle once.    Marland Kitchen DEXILANT 60 MG capsule Take 60 mg by mouth daily.  3  . diphenhydrAMINE (BENADRYL) 25 MG tablet Take 25 mg by mouth every 6 (six) hours as needed for sleep.    Marland Kitchen escitalopram (LEXAPRO) 10 MG tablet Take 1 tablet (10 mg total) by mouth daily. 90 tablet 1  . Fish Oil-Cholecalciferol (FISH OIL + D3) 1000-1000 MG-UNIT CAPS Take 1 tablet by mouth daily.    . fluticasone (FLONASE) 50 MCG/ACT nasal spray SPRAY 2 SPRAYS INTO EACH NOSTRIL  EVERY DAY 16 g 2  . glucose blood (ACCU-CHEK AVIVA PLUS) test strip Use as instructed to check blood sugar once daily. E11.9 100 each 12  . isosorbide mononitrate (IMDUR) 30 MG 24 hr tablet Take 30 mg by mouth daily.    Marland Kitchen JANUVIA 50 MG tablet Take 50 mg by mouth daily.  2  . Lancets (ACCU-CHEK SOFT TOUCH) lancets Use as instructed to check blood sugar once daily E11.9 100 each 12  . linaclotide (LINZESS) 145 MCG CAPS capsule Take 1 capsule (145 mcg total) by mouth daily as needed (FOR IBS). 90 capsule 3  . meclizine (ANTIVERT) 25 MG tablet Take 1 tablet (25 mg total) by mouth every 8 (eight) hours as needed for dizziness. 30 tablet 3  . Multiple Vitamin (MULTIVITAMIN WITH MINERALS) TABS tablet Take 1 tablet by mouth daily.    . ondansetron (ZOFRAN ODT) 4 MG disintegrating tablet Take 1 tablet (4 mg total) by mouth every 8 (eight) hours as needed for nausea or vomiting. [place]  tablet 0  . promethazine (PHENERGAN) 12.5 MG tablet Take 12.5-25 mg by mouth every 6 (six) hours as needed for nausea or vomiting.   4  . topiramate (TOPAMAX) 50 MG tablet TAKE 1 TABLET BY MOUTH TWICE A DAY 180 tablet 1   No current facility-administered medications on file prior to visit.     ALLERGIES: Allergies  Allergen Reactions  . Peanuts [Peanut Oil] Anaphylaxis  . Shrimp [Shellfish Allergy] Anaphylaxis  . Latex Hives  . Penicillins Hives    Has patient had a PCN reaction causing immediate rash, facial/tongue/throat swelling, SOB or lightheadedness with hypotension: No Has patient had a PCN reaction causing severe rash involving mucus membranes or skin necrosis: No Has patient had a PCN reaction that required hospitalization: No Has patient had a PCN reaction occurring within the last 10 years: No  If all of the above answers are "NO", then may proceed with Cephalosporin use.    Marland Kitchen Pineapple     Scratchy throat, Tongue swelling    FAMILY HISTORY: Family History  Problem Relation Age of Onset  . Cancer  Maternal Grandfather   . Kidney failure Mother   . Hypertension Mother   . Heart disease Father   . Breast cancer Neg Hx    SOCIAL HISTORY: Social History   Socioeconomic History  . Marital status: Divorced    Spouse name: Not on file  . Number of children: Not on file  . Years of education: Not on file  . Highest education level: Not on file  Occupational History  . Not on file  Social Needs  . Financial resource strain: Not on file  . Food insecurity:    Worry: Not on file    Inability: Not on file  . Transportation needs:    Medical: Not on file    Non-medical: Not on file  Tobacco Use  . Smoking status: Former Smoker    Last attempt to quit: 09/22/2012    Years since quitting: 5.6  . Smokeless tobacco: Never Used  Substance and Sexual Activity  . Alcohol use: Yes    Alcohol/week: 1.0 standard drinks    Types: 1 Glasses of wine per week    Comment: red wine 1-2 x month  . Drug use: No  . Sexual activity: Not on file  Lifestyle  . Physical activity:    Days per week: Not on file    Minutes per session: Not on file  . Stress: Not on file  Relationships  . Social connections:    Talks on phone: Not on file    Gets together: Not on file    Attends religious service: Not on file    Active member of club or organization: Not on file    Attends meetings of clubs or organizations: Not on file    Relationship status: Not on file  . Intimate partner violence:    Fear of current or ex partner: Not on file    Emotionally abused: Not on file    Physically abused: Not on file    Forced sexual activity: Not on file  Other Topics Concern  . Not on file  Social History Narrative  . Not on file    REVIEW OF SYSTEMS: Constitutional: No fevers, chills, or sweats, no generalized fatigue, change in appetite Eyes: No visual changes, double vision, eye pain Ear, nose and throat: No hearing loss, ear pain, nasal congestion, sore throat Cardiovascular: No chest pain,  palpitations Respiratory:  No shortness of  breath at rest or with exertion, wheezes GastrointestinaI: No nausea, vomiting, diarrhea, abdominal pain, fecal incontinence Genitourinary:  No dysuria, urinary retention or frequency Musculoskeletal:  No neck pain, back pain Integumentary: No rash, pruritus, skin lesions Neurological: as above Psychiatric: No depression, insomnia, anxiety Endocrine: No palpitations, fatigue, diaphoresis, mood swings, change in appetite, change in weight, increased thirst Hematologic/Lymphatic:  No purpura, petechiae. Allergic/Immunologic: no itchy/runny eyes, nasal congestion, recent allergic reactions, rashes  PHYSICAL EXAM: Blood pressure (!) 140/96, pulse 74, height '5\' 7"'$  (1.702 m), weight 192 lb (87.1 kg), SpO2 98 %. General: No acute distress.  Patient appears well-groomed.   Head:  Normocephalic/atraumatic Eyes:  Fundi examined but not visualized Neck: supple, no paraspinal tenderness, full range of motion Heart:  Regular rate and rhythm Lungs:  Clear to auscultation bilaterally Back: No paraspinal tenderness Neurological Exam: alert and oriented to person, place, and time. Attention span and concentration intact, recent and remote memory intact, fund of knowledge intact.  Speech fluent and not dysarthric, language intact.  CN II-XII intact. Bulk and tone normal, muscle strength 5/5 throughout.  Sensation to light touch, temperature and vibration intact.  Deep tendon reflexes 2+ throughout, toes downgoing.  Finger to nose and heel to shin testing intact.  Gait normal, Romberg negative.  IMPRESSION: 1.  Migraine without aura, without status migrainosus, not intractable 2.  Unsteady gait, likely medication-related and complicated by history of chronic vertigo 3.  TIA 4.  HTN  PLAN: 1.  Continue topiramate '50mg'$  twice daily 2.  Continue ASA for secondary stroke prevention 3.  Continue Lipitor as managed by cardiology (LDL goal should be less than 70) 4.   Optimize blood pressure control as per cardiology 5.  Limit use of pain relievers to no more than 2 days out of week to prevent risk of rebound or medication-overuse headache. 6.  Keep headache diary 7.  Follow Mediterranean diet (info provided) 8.  Follow up in 8 months.  Metta Clines, DO  CC: Dimas Chyle, MD

## 2018-06-01 ENCOUNTER — Encounter: Payer: Self-pay | Admitting: Neurology

## 2018-06-01 ENCOUNTER — Ambulatory Visit (INDEPENDENT_AMBULATORY_CARE_PROVIDER_SITE_OTHER): Payer: Medicare Other | Admitting: Neurology

## 2018-06-01 VITALS — BP 140/96 | HR 74 | Ht 67.0 in | Wt 192.0 lb

## 2018-06-01 DIAGNOSIS — G43009 Migraine without aura, not intractable, without status migrainosus: Secondary | ICD-10-CM

## 2018-06-01 DIAGNOSIS — G459 Transient cerebral ischemic attack, unspecified: Secondary | ICD-10-CM | POA: Diagnosis not present

## 2018-06-01 DIAGNOSIS — I10 Essential (primary) hypertension: Secondary | ICD-10-CM | POA: Diagnosis not present

## 2018-06-01 NOTE — Patient Instructions (Signed)
1.  Continue topiramate 50mg  twice daily 2.  Continue aspirin and Lipitor daily 3.  Follow Mediterranean diet (see below) 4.  Follow up in 8 months.   Mediterranean Diet A Mediterranean diet refers to food and lifestyle choices that are based on the traditions of countries located on the [place]. This way of eating has been shown to help prevent certain conditions and improve outcomes for people who have chronic diseases, like kidney disease and heart disease. What are tips for following this plan? Lifestyle  Cook and eat meals together with your family, when possible.  Drink enough fluid to keep your urine clear or pale yellow.  Be physically active every day. This includes: ? Aerobic exercise like running or swimming. ? Leisure activities like gardening, walking, or housework.  Get 7-8 hours of sleep each night.  If recommended by your health care provider, drink red wine in moderation. This means 1 glass a day for nonpregnant women and 2 glasses a day for men. A glass of wine equals 5 oz (150 mL). Reading food labels  Check the serving size of packaged foods. For foods such as rice and pasta, the serving size refers to the amount of cooked product, not dry.  Check the total fat in packaged foods. Avoid foods that have saturated fat or trans fats.  Check the ingredients list for added sugars, such as corn syrup. Shopping  At the grocery store, buy most of your food from the areas near the walls of the store. This includes: ? Fresh fruits and vegetables (produce). ? Grains, beans, nuts, and seeds. Some of these may be available in unpackaged forms or large amounts (in bulk). ? Fresh seafood. ? Poultry and eggs. ? Low-fat dairy products.  Buy whole ingredients instead of prepackaged foods.  Buy fresh fruits and vegetables in-season from local farmers markets.  Buy frozen fruits and vegetables in resealable bags.  If you do not have access to quality fresh  seafood, buy precooked frozen shrimp or canned fish, such as tuna, salmon, or sardines.  Buy small amounts of raw or cooked vegetables, salads, or olives from the deli or salad bar at your store.  Stock your pantry so you always have certain foods on hand, such as olive oil, canned tuna, canned tomatoes, rice, pasta, and beans. Cooking  Cook foods with extra-virgin olive oil instead of using butter or other vegetable oils.  Have meat as a side dish, and have vegetables or grains as your main dish. This means having meat in small portions or adding small amounts of meat to foods like pasta or stew.  Use beans or vegetables instead of meat in common dishes like chili or lasagna.  Experiment with different cooking methods. Try roasting or broiling vegetables instead of steaming or sauteing them.  Add frozen vegetables to soups, stews, pasta, or rice.  Add nuts or seeds for added healthy fat at each meal. You can add these to yogurt, salads, or vegetable dishes.  Marinate fish or vegetables using olive oil, lemon juice, garlic, and fresh herbs. Meal planning  Plan to eat 1 vegetarian meal one day each week. Try to work up to 2 vegetarian meals, if possible.  Eat seafood 2 or more times a week.  Have healthy snacks readily available, such as: ? Vegetable sticks with hummus. ? [place] yogurt. ? Fruit and nut trail mix.  Eat balanced meals throughout the week. This includes: ? Fruit: 2-3 servings a day ? Vegetables: 4-5 servings a  day ? Low-fat dairy: 2 servings a day ? Fish, poultry, or lean meat: 1 serving a day ? Beans and legumes: 2 or more servings a week ? Nuts and seeds: 1-2 servings a day ? Whole grains: 6-8 servings a day ? Extra-virgin olive oil: 3-4 servings a day  Limit red meat and sweets to only a few servings a month What are my food choices?  Mediterranean diet ? Recommended ? Grains: Whole-grain pasta. Brown rice. Bulgar wheat. Polenta. Couscous. Whole-wheat  bread. [place]. ? Vegetables: Artichokes. Beets. Broccoli. Cabbage. Carrots. Eggplant. Green beans. Chard. Kale. Spinach. Onions. Leeks. Peas. Squash. Tomatoes. Peppers. Radishes. ? Fruits: Apples. Apricots. Avocado. Berries. Bananas. Cherries. Dates. Figs. Grapes. Lemons. Melon. Oranges. Peaches. Plums. Pomegranate. ? Meats and other protein foods: Beans. Almonds. Sunflower seeds. Pine nuts. Peanuts. [place]. Salmon. Scall[place]. Shrimp. [place]. Tilapia. Clams. Oysters. Eggs. ? Dairy: Low-fat milk. Cheese. Greek yogurt. ? Beverages: Water. Red wine. Herbal tea. ? Fats and oils: Extra virgin olive oil. Avocado oil. Grape seed oil. ? Sweets and desserts: [place] yogurt with honey. Baked apples. Poached pears. Trail mix. ? Seasoning and other foods: Basil. Cilantro. Coriander. Cumin. Mint. Parsley. Sage. Rosemary. Tarragon. Garlic. Oregano. Thyme. Pepper. Balsalmic vinegar. Tahini. Hummus. Tomato sauce. Olives. Mushrooms. ? Limit these ? Grains: Prepackaged pasta or rice dishes. Prepackaged cereal with added sugar. ? Vegetables: Deep fried potatoes (french fries). ? Fruits: Fruit canned in syrup. ? Meats and other protein foods: Beef. Pork. Lamb. Poultry with skin. Hot dogs. [place] Salines. ? Dairy: Ice cream. Sour cream. Whole milk. ? Beverages: Juice. Sugar-sweetened soft drinks. Beer. Liquor and spirits. ? Fats and oils: Butter. Canola oil. Vegetable oil. Beef fat (tallow). Lard. ? Sweets and desserts: Cookies. Cakes. Pies. Candy. ? Seasoning and other foods: Mayonnaise. Premade sauces and marinades. ? The items listed may not be a complete list. Talk with your dietitian about what dietary choices are right for you. Summary  The Mediterranean diet includes both food and lifestyle choices.  Eat a variety of fresh fruits and vegetables, beans, nuts, seeds, and whole grains.  Limit the amount of red meat and sweets that you eat.  Talk with your health care provider about whether it is safe for you to  drink red wine in moderation. This means 1 glass a day for nonpregnant women and 2 glasses a day for men. A glass of wine equals 5 oz (150 mL). This information is not intended to replace advice given to you by your health care provider. Make sure you discuss any questions you have with your health care provider. Document Released: 05/05/2016 Document Revised: 06/07/2016 Document Reviewed: 05/05/2016 Elsevier Interactive Patient Education  [employer].

## 2018-06-05 ENCOUNTER — Telehealth: Payer: Self-pay | Admitting: *Deleted

## 2018-06-05 NOTE — Telephone Encounter (Signed)
pt called [facility] and cancelled 9/11 MD visit. Message to scheduling

## 2018-06-06 ENCOUNTER — Encounter: Payer: Self-pay | Admitting: Internal Medicine

## 2018-06-06 ENCOUNTER — Telehealth: Payer: Self-pay | Admitting: Internal Medicine

## 2018-06-06 ENCOUNTER — Telehealth: Payer: Self-pay | Admitting: *Deleted

## 2018-06-06 NOTE — Telephone Encounter (Signed)
pt called would like to r/s new pt appt. Message to scheduling

## 2018-06-06 NOTE — Telephone Encounter (Signed)
Pt cld to reschedule appt with Dr. Arbutus Ped to 10/10 at 1130am. A new letter with the appt information will be mailed to the pt.

## 2018-06-07 ENCOUNTER — Inpatient Hospital Stay: Payer: Medicare Other | Admitting: Internal Medicine

## 2018-06-22 ENCOUNTER — Telehealth: Payer: Self-pay | Admitting: Family Medicine

## 2018-06-22 NOTE — Telephone Encounter (Signed)
Klonopin1 mg refill Last Refill:09/13/17                                                              Last OV: not seen PCP:  Julie Gilbert Pharmacy:[facility]  [phone]

## 2018-06-22 NOTE — Telephone Encounter (Signed)
Please advise 

## 2018-06-22 NOTE — Telephone Encounter (Signed)
See note

## 2018-06-22 NOTE — Telephone Encounter (Signed)
Copied from CRM [id number]. Topic: Quick Communication - Rx Refill/Question >> Jun 22, 2018  1:57 PM Jens Som A wrote: Medication: clonazePAM (KLONOPIN) 1 MG tablet [[id number]]   Has the patient contacted their pharmacy? Yes  (Agent: If no, request that the patient contact the pharmacy for the refill.) (Agent: If yes, when and what did the pharmacy advise?)  Preferred Pharmacy (with phone number or street name): Yes   Agent: Please be advised that RX refills may take up to 3 business days. We ask that you follow-up with your pharmacy.

## 2018-06-26 MED ORDER — CLONAZEPAM 1 MG PO TABS: 1.0000 mg | ORAL_TABLET | Freq: Three times a day (TID) | ORAL | 2 refills | Status: DC

## 2018-06-26 NOTE — Telephone Encounter (Signed)
Rx sent in.  Julie Gilbert. [name], MD 06/26/2018 8:31 AM

## 2018-07-05 ENCOUNTER — Inpatient Hospital Stay: Payer: Medicare Other

## 2018-07-05 ENCOUNTER — Telehealth: Payer: Self-pay

## 2018-07-05 ENCOUNTER — Inpatient Hospital Stay: Payer: Medicare Other | Attending: Internal Medicine | Admitting: Internal Medicine

## 2018-07-05 ENCOUNTER — Encounter: Payer: Self-pay | Admitting: Internal Medicine

## 2018-07-05 VITALS — BP 143/95 | HR 69 | Temp 98.2°F | Resp 17 | Ht 67.0 in | Wt 186.2 lb

## 2018-07-05 DIAGNOSIS — Z87891 Personal history of nicotine dependence: Secondary | ICD-10-CM

## 2018-07-05 DIAGNOSIS — Z79899 Other long term (current) drug therapy: Secondary | ICD-10-CM

## 2018-07-05 DIAGNOSIS — E119 Type 2 diabetes mellitus without complications: Secondary | ICD-10-CM | POA: Diagnosis not present

## 2018-07-05 DIAGNOSIS — D649 Anemia, unspecified: Secondary | ICD-10-CM

## 2018-07-05 DIAGNOSIS — I10 Essential (primary) hypertension: Secondary | ICD-10-CM

## 2018-07-05 LAB — CMP (CANCER CENTER ONLY)
ALBUMIN: 4.2 g/dL (ref 3.5–5.0)
ALK PHOS: 84 U/L (ref 38–126)
ALT: 12 U/L (ref 0–44)
AST: 20 U/L (ref 15–41)
Anion gap: 11 (ref 5–15)
BILIRUBIN TOTAL: 0.7 mg/dL (ref 0.3–1.2)
BUN: 20 mg/dL (ref 8–23)
CALCIUM: 9.6 mg/dL (ref 8.9–10.3)
CO2: 23 mmol/L (ref 22–32)
CREATININE: 1.71 mg/dL — AB (ref 0.44–1.00)
Chloride: 108 mmol/L (ref 98–111)
GFR, Est AFR Am: 35 mL/min — ABNORMAL LOW (ref >60)
GFR, Estimated: 30 mL/min — ABNORMAL LOW (ref >60)
Glucose, Bld: 92 mg/dL (ref 70–99)
Potassium: 4.8 mmol/L (ref 3.5–5.1)
SODIUM: 142 mmol/L (ref 135–145)
TOTAL PROTEIN: 8.2 g/dL — AB (ref 6.5–8.1)

## 2018-07-05 LAB — FOLATE: Folate: 10.1 ng/mL (ref >5.9)

## 2018-07-05 LAB — CBC WITH DIFFERENTIAL (CANCER CENTER ONLY)
ABS IMMATURE GRANULOCYTES: 0.01 10*3/uL (ref 0.00–0.07)
BASOS PCT: 1 %
Basophils Absolute: 0 10*3/uL (ref 0.0–0.1)
EOS PCT: 1 %
Eosinophils Absolute: 0.1 10*3/uL (ref 0.0–0.5)
HCT: 32.4 % — ABNORMAL LOW (ref 36.0–46.0)
HEMOGLOBIN: 10.2 g/dL — AB (ref 12.0–15.0)
Immature Granulocytes: 0 %
LYMPHS PCT: 37 %
Lymphs Abs: 1.7 10*3/uL (ref 0.7–4.0)
MCH: 27.1 pg (ref 26.0–34.0)
MCHC: 31.5 g/dL (ref 30.0–36.0)
MCV: 86.2 fL (ref 80.0–100.0)
MONOS PCT: 9 %
Monocytes Absolute: 0.4 10*3/uL (ref 0.1–1.0)
NEUTROS ABS: 2.4 10*3/uL (ref 1.7–7.7)
Neutrophils Relative %: 52 %
Platelet Count: 204 10*3/uL (ref 150–400)
RBC: 3.76 MIL/uL — AB (ref 3.87–5.11)
RDW: 14.6 % (ref 11.5–15.5)
WBC: 4.7 10*3/uL (ref 4.0–10.5)
nRBC: 0 % (ref 0.0–0.2)

## 2018-07-05 LAB — IRON AND TIBC
IRON: 85 ug/dL (ref 41–142)
SATURATION RATIOS: 29 % (ref 21–57)
TIBC: 288 ug/dL (ref 236–444)
UIBC: 203 ug/dL

## 2018-07-05 LAB — VITAMIN B12: Vitamin B-12: 1063 pg/mL — ABNORMAL HIGH (ref 180–914)

## 2018-07-05 LAB — FERRITIN: FERRITIN: 75 ng/mL (ref 11–307)

## 2018-07-05 LAB — LACTATE DEHYDROGENASE: LDH: 205 U/L — AB (ref 98–192)

## 2018-07-05 NOTE — Telephone Encounter (Signed)
Printed avs and calender of upcoming appointment. Per 10/10 los. With lab add on

## 2018-07-05 NOTE — Progress Notes (Signed)
[place] [phone]   [phone]  CONSULT NOTE  REFERRING PHYSICIAN: Dr. Dimas Chyle  REASON FOR CONSULTATION:  67 years old African-American female with chronic anemia.  HPI Julie Gilbert is a 67 y.o. female with past medical history significant for hypertension, diabetes mellitus, asthma, irritable bowel syndrome, coronary artery disease, migraine headache as well as stroke in November 2017.  The patient also mentions that she has a history of anemia for more than a year.  She has been complaining of feeling cold all the time.  She was seen by her primary care physician several years ago complaining of increasing fatigue and weakness and CBC performed at that time showed hemoglobin of 11.3 and hematocrit 34.2%.  Repeat CBC on June 29, 2016 showed hemoglobin of 9.9 and hematocrit 30.6%.  On September 27, 2017 her hemoglobin was down to 9.1 and hematocrit 27.4%.  For some reason the patient was diagnosed with vitamin B12 deficiency and she received monthly B12 injection with no improvement in her condition.  She had upper endoscopy and colonoscopy performed 2 months ago by her gastroenterologist in the [place] area with removal of few polyps.  The patient was referred to me today for evaluation and recommendation regarding her condition. When seen today she continues to complain of dizzy spells as well as intermittent fall.  She also has fatigue with some visual changes.  She denied having any bleeding, bruises or ecchymosis.  She has mild nausea with no vomiting, diarrhea or constipation.  She denied having any chest pain but has shortness of breath at baseline increased with exertion. Family history significant for mother with end-stage renal disease and hypertension, father had prostate cancer and heart disease, maternal grand father had prostate cancer, brother with prostate cancer and sister with breast cancer. The patient is single and has 2 children.   She was accompanied today by her daughter Julie Gilbert.  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  She has no history for smoking but drinks alcohol and no history of drug abuse.  HPI  Past Medical History:  Diagnosis Date  . Asthma   . Diabetes mellitus without complication ([place])   . Hypertension   . IBS (irritable bowel syndrome)     Past Surgical History:  Procedure Laterality Date  . CESAREAN SECTION    . EXPLORATORY LAPAROTOMY      Family History  Problem Relation Age of Onset  . Cancer Maternal Grandfather   . Kidney failure Mother   . Hypertension Mother   . Heart disease Father   . Breast cancer Neg Hx     Social History Social History   Tobacco Use  . Smoking status: Former Smoker    Last attempt to quit: 09/22/2012    Years since quitting: 5.7  . Smokeless tobacco: Never Used  Substance Use Topics  . Alcohol use: Yes    Alcohol/week: 1.0 standard drinks    Types: 1 Glasses of wine per week    Comment: red wine 1-2 x month  . Drug use: No    Allergies  Allergen Reactions  . Peanuts [Peanut Oil] Anaphylaxis  . Shrimp [Shellfish Allergy] Anaphylaxis  . Latex Hives  . Penicillins Hives    Has patient had a PCN reaction causing immediate rash, facial/tongue/throat swelling, SOB or lightheadedness with hypotension: No Has patient had a PCN reaction causing severe rash involving mucus membranes or skin necrosis: No Has patient had a PCN reaction that required hospitalization: No Has  patient had a PCN reaction occurring within the last 10 years: No  If all of the above answers are "NO", then may proceed with Cephalosporin use.    Marland Kitchen Pineapple     Scratchy throat, Tongue swelling    Current Outpatient Medications  Medication Sig Dispense Refill  . amLODipine (NORVASC) 5 MG tablet Take 5 mg by mouth daily.    . Ascorbic Acid (VITAMIN C) 1000 MG tablet Take 1,000 mg by mouth daily.    Marland Kitchen aspirin 81 MG tablet Take 81 mg by mouth daily.    . Blood Glucose  Monitoring Suppl (ACCU-CHEK AVIVA PLUS) w/Device KIT Use as directed to check blood sugar once daily. E11.9 1 kit 0  . Blood Pressure Monitoring (BLOOD PRESSURE KIT) DEVI 1 application by Does not apply route daily. Use to check blood pressure daily 1 Device 0  . budesonide-formoterol (SYMBICORT) 80-4.5 MCG/ACT inhaler Inhale 2 puffs into the lungs 2 (two) times daily.    . Cholecalciferol 1000 units tablet Take 1,000 Units by mouth daily.    . clonazePAM (KLONOPIN) 1 MG tablet Take 1 tablet (1 mg total) by mouth 3 (three) times daily. 90 tablet 2  . COD LIVER OIL PO Take 500 Units by mouth daily.    . cyanocobalamin (,VITAMIN B-12,) 1000 MCG/ML injection Inject 1,000 mcg into the muscle once.    Marland Kitchen DEXILANT 60 MG capsule Take 60 mg by mouth daily.  3  . diphenhydrAMINE (BENADRYL) 25 MG tablet Take 25 mg by mouth every 6 (six) hours as needed for sleep.    Marland Kitchen escitalopram (LEXAPRO) 10 MG tablet Take 1 tablet (10 mg total) by mouth daily. 90 tablet 1  . Fish Oil-Cholecalciferol (FISH OIL + D3) 1000-1000 MG-UNIT CAPS Take 1 tablet by mouth daily.    Marland Kitchen glucose blood (ACCU-CHEK AVIVA PLUS) test strip Use as instructed to check blood sugar once daily. E11.9 100 each 12  . isosorbide mononitrate (IMDUR) 30 MG 24 hr tablet Take 30 mg by mouth daily.    Marland Kitchen JANUVIA 50 MG tablet Take 50 mg by mouth daily.  2  . Lancets (ACCU-CHEK SOFT TOUCH) lancets Use as instructed to check blood sugar once daily E11.9 100 each 12  . linaclotide (LINZESS) 145 MCG CAPS capsule Take 1 capsule (145 mcg total) by mouth daily as needed (FOR IBS). 90 capsule 3  . meclizine (ANTIVERT) 25 MG tablet Take 1 tablet (25 mg total) by mouth every 8 (eight) hours as needed for dizziness. 30 tablet 3  . Multiple Vitamin (MULTIVITAMIN WITH MINERALS) TABS tablet Take 1 tablet by mouth daily.    . promethazine (PHENERGAN) 12.5 MG tablet Take 12.5-25 mg by mouth every 6 (six) hours as needed for nausea or vomiting.   4  . topiramate (TOPAMAX)  50 MG tablet TAKE 1 TABLET BY MOUTH TWICE A DAY 180 tablet 1   No current facility-administered medications for this visit.     Review of Systems  Constitutional: positive for fatigue Eyes: negative Ears, nose, mouth, throat, and face: negative Respiratory: positive for dyspnea on exertion Cardiovascular: negative Gastrointestinal: positive for nausea Genitourinary:negative Integument/breast: negative Hematologic/lymphatic: negative Musculoskeletal:positive for muscle weakness Neurological: negative Behavioral/Psych: negative Endocrine: negative Allergic/Immunologic: negative  Physical Exam  WSF:KCLEX, healthy, no distress, well nourished and well developed SKIN: skin color, texture, turgor are normal, no rashes or significant lesions HEAD: Normocephalic, No masses, lesions, tenderness or abnormalities EYES: normal, PERRLA, Conjunctiva are pink and non-injected EARS: External ears normal, Canals clear OROPHARYNX:no  exudate, no erythema and lips, buccal mucosa, and tongue normal  NECK: supple, no adenopathy, no JVD LYMPH:  no palpable lymphadenopathy, no hepatosplenomegaly BREAST:not examined LUNGS: clear to auscultation , and palpation HEART: regular rate & rhythm, no murmurs and no gallops ABDOMEN:abdomen soft, non-tender, normal bowel sounds and no masses or organomegaly BACK: No CVA tenderness, Range of motion is normal EXTREMITIES:no joint deformities, effusion, or inflammation, no edema  NEURO: alert & oriented x 3 with fluent speech, no focal motor/sensory deficits  PERFORMANCE STATUS: ECOG 1  LABORATORY DATA: Lab Results  Component Value Date   WBC 4.7 07/05/2018   HGB 10.2 (L) 07/05/2018   HCT 32.4 (L) 07/05/2018   MCV 86.2 07/05/2018   PLT 204 07/05/2018      Chemistry      Component Value Date/Time   NA 142 07/05/2018 1231   K 4.8 07/05/2018 1231   CL 108 07/05/2018 1231   CO2 23 07/05/2018 1231   BUN 20 07/05/2018 1231   CREATININE 1.71 (H)  07/05/2018 1231   CREATININE 1.10 (H) 01/09/2017 1130      Component Value Date/Time   CALCIUM 9.6 07/05/2018 1231   ALKPHOS 84 07/05/2018 1231   AST 20 07/05/2018 1231   ALT 12 07/05/2018 1231   BILITOT 0.7 07/05/2018 1231       RADIOGRAPHIC STUDIES: No results found.  ASSESSMENT: This is a very pleasant 68 years old African-American female with chronic normocytic normochromic anemia of of unclear etiology suspicious for anemia of chronic disease secondary to renal insufficiency.  PLAN: I had a lengthy discussion with the patient and her daughter today about her current condition and further investigation to confirm her diagnosis. I order several studies today including repeat CBC, complaints metabolic panel, LDH, iron study, ferritin, serum folate, vitamin B12 level as well as hemoglobin electrophoresis and serum protein electrophoresis with immune fixation. Her hemoglobin and hematocrit today are low but no requirement for any transfusion at this point. I will arrange for the patient to come back for follow-up visit in 3 to 4 weeks for reevaluation and discussion of her lab results as well as recommendation regarding treatment of her condition. The patient was advised to call immediately if she has any concerning symptoms in the interval. The patient voices understanding of current disease status and treatment options and is in agreement with the current care plan.  All questions were answered. The patient knows to call the clinic with any problems, questions or concerns. We can certainly see the patient much sooner if necessary.  Thank you so much for allowing me to participate in the care of Julie Gilbert. I will continue to follow up the patient with you and assist in her care.  I spent 40 minutes counseling the patient face to face. The total time spent in the appointment was 60 minutes.  Disclaimer: This note was dictated with voice recognition software. Similar sounding  words can inadvertently be transcribed and may not be corrected upon review.   Eilleen Kempf July 05, 2018, 12:00 PM

## 2018-07-06 LAB — HEMOGLOBINOPATHY EVALUATION
HGB A2 QUANT: 3.6 % — AB (ref 1.8–3.2)
Hgb A: 59 % — ABNORMAL LOW (ref 96.4–98.8)
Hgb C: 0 %
Hgb F Quant: 0 % (ref 0.0–2.0)
Hgb S Quant: 37.4 % — ABNORMAL HIGH
Hgb Variant: 0 %

## 2018-07-06 LAB — PROTEIN ELECTROPHORESIS, SERUM, WITH REFLEX
A/G RATIO SPE: 1.1 (ref 0.7–1.7)
Albumin ELP: 4.1 g/dL (ref 2.9–4.4)
Alpha-1-Globulin: 0.2 g/dL (ref 0.0–0.4)
Alpha-2-Globulin: 1 g/dL (ref 0.4–1.0)
Beta Globulin: 1.1 g/dL (ref 0.7–1.3)
GAMMA GLOBULIN: 1.3 g/dL (ref 0.4–1.8)
GLOBULIN, TOTAL: 3.6 g/dL (ref 2.2–3.9)
TOTAL PROTEIN ELP: 7.7 g/dL (ref 6.0–8.5)

## 2018-07-06 LAB — ERYTHROPOIETIN: ERYTHROPOIETIN: 11.6 m[IU]/mL (ref 2.6–18.5)

## 2018-08-01 ENCOUNTER — Encounter: Payer: Self-pay | Admitting: Internal Medicine

## 2018-08-01 ENCOUNTER — Inpatient Hospital Stay (HOSPITAL_BASED_OUTPATIENT_CLINIC_OR_DEPARTMENT_OTHER): Payer: Medicare Other | Admitting: Internal Medicine

## 2018-08-01 ENCOUNTER — Inpatient Hospital Stay: Payer: Medicare Other | Attending: Internal Medicine

## 2018-08-01 ENCOUNTER — Telehealth: Payer: Self-pay | Admitting: Internal Medicine

## 2018-08-01 VITALS — BP 156/80 | HR 65 | Temp 98.0°F | Resp 18 | Ht 68.0 in | Wt 188.5 lb

## 2018-08-01 DIAGNOSIS — I10 Essential (primary) hypertension: Secondary | ICD-10-CM | POA: Insufficient documentation

## 2018-08-01 DIAGNOSIS — Z7982 Long term (current) use of aspirin: Secondary | ICD-10-CM | POA: Insufficient documentation

## 2018-08-01 DIAGNOSIS — D573 Sickle-cell trait: Secondary | ICD-10-CM | POA: Insufficient documentation

## 2018-08-01 DIAGNOSIS — N289 Disorder of kidney and ureter, unspecified: Secondary | ICD-10-CM

## 2018-08-01 DIAGNOSIS — Z79899 Other long term (current) drug therapy: Secondary | ICD-10-CM

## 2018-08-01 DIAGNOSIS — D649 Anemia, unspecified: Secondary | ICD-10-CM

## 2018-08-01 LAB — CBC WITH DIFFERENTIAL (CANCER CENTER ONLY)
Abs Immature Granulocytes: 0.01 10*3/uL (ref 0.00–0.07)
BASOS PCT: 1 %
Basophils Absolute: 0 10*3/uL (ref 0.0–0.1)
Eosinophils Absolute: 0.1 10*3/uL (ref 0.0–0.5)
Eosinophils Relative: 1 %
HCT: 30.4 % — ABNORMAL LOW (ref 36.0–46.0)
HEMOGLOBIN: 9.7 g/dL — AB (ref 12.0–15.0)
IMMATURE GRANULOCYTES: 0 %
Lymphocytes Relative: 38 %
Lymphs Abs: 1.8 10*3/uL (ref 0.7–4.0)
MCH: 27.6 pg (ref 26.0–34.0)
MCHC: 31.9 g/dL (ref 30.0–36.0)
MCV: 86.4 fL (ref 80.0–100.0)
MONO ABS: 0.4 10*3/uL (ref 0.1–1.0)
MONOS PCT: 9 %
NRBC: 0 % (ref 0.0–0.2)
Neutro Abs: 2.4 10*3/uL (ref 1.7–7.7)
Neutrophils Relative %: 51 %
PLATELETS: 187 10*3/uL (ref 150–400)
RBC: 3.52 MIL/uL — AB (ref 3.87–5.11)
RDW: 14.5 % (ref 11.5–15.5)
WBC: 4.7 10*3/uL (ref 4.0–10.5)

## 2018-08-01 NOTE — Telephone Encounter (Signed)
Per 11/6 Follow-up visit on as-needed basis.

## 2018-08-01 NOTE — Progress Notes (Signed)
[place] Telephone:(336) [phone]   Fax:(336) [phone]  OFFICE PROGRESS NOTE  Vivi Barrack, MD [place] [address]  DIAGNOSIS: chronic normocytic normochromic anemia of of suspicious for anemia of chronic disease secondary to renal insufficiency as well as sickle cell trait.  PRIOR THERAPY:None.  CURRENT THERAPY: Observation.  INTERVAL HISTORY: Julie Gilbert 67 y.o. female returns to the clinic today for follow-up visit accompanied by her daughter.  The patient is feeling fine today with no concerning complaints.  She denied having any current fatigue or weakness.  She denied having any nausea, vomiting, diarrhea or constipation.  She has no recent weight loss or night sweats.  She has no bleeding issues.  She had several studies performed recently for evaluation of her anemia and she is here for discussion of her lab results and recommendation regarding her condition.  MEDICAL HISTORY: Past Medical History:  Diagnosis Date  . Asthma   . Diabetes mellitus without complication ([place])   . Hypertension   . IBS (irritable bowel syndrome)     ALLERGIES:  is allergic to peanuts [peanut oil]; shrimp [shellfish allergy]; latex; penicillins; and pineapple.  MEDICATIONS:  Current Outpatient Medications  Medication Sig Dispense Refill  . amLODipine (NORVASC) 5 MG tablet Take 5 mg by mouth daily.    . Ascorbic Acid (VITAMIN C) 1000 MG tablet Take 1,000 mg by mouth daily.    Marland Kitchen aspirin 81 MG tablet Take 81 mg by mouth daily.    . Blood Glucose Monitoring Suppl (ACCU-CHEK AVIVA PLUS) w/Device KIT Use as directed to check blood sugar once daily. E11.9 1 kit 0  . Blood Pressure Monitoring (BLOOD PRESSURE KIT) DEVI 1 application by Does not apply route daily. Use to check blood pressure daily 1 Device 0  . budesonide-formoterol (SYMBICORT) 80-4.5 MCG/ACT inhaler Inhale 2 puffs into the lungs 2 (two) times daily.    . Cholecalciferol 1000 units tablet Take  1,000 Units by mouth daily.    . clonazePAM (KLONOPIN) 1 MG tablet Take 1 tablet (1 mg total) by mouth 3 (three) times daily. 90 tablet 2  . COD LIVER OIL PO Take 500 Units by mouth daily.    . cyanocobalamin (,VITAMIN B-12,) 1000 MCG/ML injection Inject 1,000 mcg into the muscle once.    Marland Kitchen DEXILANT 60 MG capsule Take 60 mg by mouth daily.  3  . diclofenac sodium (VOLTAREN) 1 % GEL APPLY 2 GRAM TO THE AFFECTED AREA(S) BY TOPICAL ROUTE 4 TIMES PER DAY FOR 30 DAYS  3  . diphenhydrAMINE (BENADRYL) 25 MG tablet Take 25 mg by mouth every 6 (six) hours as needed for sleep.    Marland Kitchen escitalopram (LEXAPRO) 10 MG tablet Take 1 tablet (10 mg total) by mouth daily. 90 tablet 1  . Fish Oil-Cholecalciferol (FISH OIL + D3) 1000-1000 MG-UNIT CAPS Take 1 tablet by mouth daily.    . flavoxATE (URISPAS) 100 MG tablet Take 100 mg by mouth 3 (three) times daily.  1  . glucose blood (ACCU-CHEK AVIVA PLUS) test strip Use as instructed to check blood sugar once daily. E11.9 100 each 12  . isosorbide mononitrate (IMDUR) 30 MG 24 hr tablet Take 30 mg by mouth daily.    Marland Kitchen JANUVIA 50 MG tablet Take 50 mg by mouth daily.  2  . Lancets (ACCU-CHEK SOFT TOUCH) lancets Use as instructed to check blood sugar once daily E11.9 100 each 12  . linaclotide (LINZESS) 145 MCG CAPS capsule Take  1 capsule (145 mcg total) by mouth daily as needed (FOR IBS). 90 capsule 3  . meclizine (ANTIVERT) 25 MG tablet Take 1 tablet (25 mg total) by mouth every 8 (eight) hours as needed for dizziness. 30 tablet 3  . Multiple Vitamin (MULTIVITAMIN WITH MINERALS) TABS tablet Take 1 tablet by mouth daily.    . promethazine (PHENERGAN) 12.5 MG tablet Take 12.5-25 mg by mouth every 6 (six) hours as needed for nausea or vomiting.   4  . topiramate (TOPAMAX) 50 MG tablet TAKE 1 TABLET BY MOUTH TWICE A DAY 180 tablet 1   No current facility-administered medications for this visit.     SURGICAL HISTORY:  Past Surgical History:  Procedure Laterality Date  .  CESAREAN SECTION    . EXPLORATORY LAPAROTOMY      REVIEW OF SYSTEMS:  A comprehensive review of systems was negative.   PHYSICAL EXAMINATION: General appearance: alert, cooperative and no distress Head: Normocephalic, without obvious abnormality, atraumatic Neck: no adenopathy, no JVD, supple, symmetrical, trachea midline and thyroid not enlarged, symmetric, no tenderness/mass/nodules Lymph nodes: Cervical, supraclavicular, and axillary nodes normal. Resp: clear to auscultation bilaterally Back: symmetric, no curvature. ROM normal. No CVA tenderness. Cardio: regular rate and rhythm, S1, S2 normal, no murmur, click, rub or gallop GI: soft, non-tender; bowel sounds normal; no masses,  no organomegaly Extremities: extremities normal, atraumatic, no cyanosis or edema  ECOG PERFORMANCE STATUS: 1 - Symptomatic but completely ambulatory  Blood pressure (!) 156/80, pulse 65, temperature 98 F (36.7 C), temperature source Oral, resp. rate 18, height _0  (1.727 m), weight 188 lb 8 oz (85.5 kg), SpO2 100 %.  LABORATORY DATA: Lab Results  Component Value Date   WBC 4.7 08/01/2018   HGB 9.7 (L) 08/01/2018   HCT 30.4 (L) 08/01/2018   MCV 86.4 08/01/2018   PLT 187 08/01/2018      Chemistry      Component Value Date/Time   NA 142 07/05/2018 1231   K 4.8 07/05/2018 1231   CL 108 07/05/2018 1231   CO2 23 07/05/2018 1231   BUN 20 07/05/2018 1231   CREATININE 1.71 (H) 07/05/2018 1231   CREATININE 1.10 (H) 01/09/2017 1130      Component Value Date/Time   CALCIUM 9.6 07/05/2018 1231   ALKPHOS 84 07/05/2018 1231   AST 20 07/05/2018 1231   ALT 12 07/05/2018 1231   BILITOT 0.7 07/05/2018 1231       RADIOGRAPHIC STUDIES: No results found.  ASSESSMENT AND PLAN: This is a very pleasant 67 years old African-American female with chronic anemia secondary to anemia of chronic disease in addition to sickle cell trait. The patient had several studies performed recently that confirmed the  sickle cell trait.  Serum protein electrophoresis was unremarkable.  The patient has normal iron study and ferritin as well as Q65 and folic acid. I discussed the lab results with the patient and her daughter today.  I recommended for her to continue on observation with close monitoring by her primary care physician at this point. For the renal insufficiency she will be seen by her primary care physician and if needed would be referred to nephrology for evaluation. I will see the patient on as-needed basis at this point. The patient voices understanding of current disease status and treatment options and is in agreement with the current care plan.  All questions were answered. The patient knows to call the clinic with any problems, questions or concerns. We can certainly see the patient  much sooner if necessary.  I spent 10 minutes counseling the patient face to face. The total time spent in the appointment was 15 minutes.  Disclaimer: This note was dictated with voice recognition software. Similar sounding words can inadvertently be transcribed and may not be corrected upon review.

## 2018-08-17 ENCOUNTER — Ambulatory Visit (INDEPENDENT_AMBULATORY_CARE_PROVIDER_SITE_OTHER): Payer: Medicare Other | Admitting: Family Medicine

## 2018-08-17 ENCOUNTER — Encounter: Payer: Self-pay | Admitting: Family Medicine

## 2018-08-17 VITALS — BP 136/84 | HR 70 | Temp 97.8°F | Ht 68.0 in | Wt 188.8 lb

## 2018-08-17 DIAGNOSIS — F331 Major depressive disorder, recurrent, moderate: Secondary | ICD-10-CM | POA: Diagnosis not present

## 2018-08-17 DIAGNOSIS — E785 Hyperlipidemia, unspecified: Secondary | ICD-10-CM | POA: Diagnosis not present

## 2018-08-17 DIAGNOSIS — I10 Essential (primary) hypertension: Secondary | ICD-10-CM | POA: Diagnosis not present

## 2018-08-17 DIAGNOSIS — E1169 Type 2 diabetes mellitus with other specified complication: Secondary | ICD-10-CM

## 2018-08-17 DIAGNOSIS — N183 Chronic kidney disease, stage 3 (moderate): Secondary | ICD-10-CM

## 2018-08-17 DIAGNOSIS — Z1159 Encounter for screening for other viral diseases: Secondary | ICD-10-CM

## 2018-08-17 DIAGNOSIS — E1149 Type 2 diabetes mellitus with other diabetic neurological complication: Secondary | ICD-10-CM | POA: Diagnosis not present

## 2018-08-17 DIAGNOSIS — E1159 Type 2 diabetes mellitus with other circulatory complications: Secondary | ICD-10-CM

## 2018-08-17 DIAGNOSIS — Z114 Encounter for screening for human immunodeficiency virus [HIV]: Secondary | ICD-10-CM

## 2018-08-17 DIAGNOSIS — E1122 Type 2 diabetes mellitus with diabetic chronic kidney disease: Secondary | ICD-10-CM

## 2018-08-17 DIAGNOSIS — F419 Anxiety disorder, unspecified: Secondary | ICD-10-CM

## 2018-08-17 LAB — COMPREHENSIVE METABOLIC PANEL
ALT: 10 U/L (ref 0–35)
AST: 13 U/L (ref 0–37)
Albumin: 4.4 g/dL (ref 3.5–5.2)
Alkaline Phosphatase: 68 U/L (ref 39–117)
BILIRUBIN TOTAL: 0.6 mg/dL (ref 0.2–1.2)
BUN: 19 mg/dL (ref 6–23)
CALCIUM: 9.2 mg/dL (ref 8.4–10.5)
CO2: 21 mEq/L (ref 19–32)
CREATININE: 1.56 mg/dL — AB (ref 0.40–1.20)
Chloride: 109 mEq/L (ref 96–112)
GFR: 42.77 mL/min — AB (ref >60.00)
GLUCOSE: 132 mg/dL — AB (ref 70–99)
Potassium: 3.7 mEq/L (ref 3.5–5.1)
Sodium: 141 mEq/L (ref 135–145)
Total Protein: 7.2 g/dL (ref 6.0–8.3)

## 2018-08-17 LAB — CBC
HCT: 31.1 % — ABNORMAL LOW (ref 36.0–46.0)
HEMOGLOBIN: 10.3 g/dL — AB (ref 12.0–15.0)
MCHC: 33 g/dL (ref 30.0–36.0)
MCV: 85.9 fl (ref 78.0–100.0)
PLATELETS: 183 10*3/uL (ref 150.0–400.0)
RBC: 3.62 Mil/uL — ABNORMAL LOW (ref 3.87–5.11)
RDW: 15.4 % (ref 11.5–15.5)
WBC: 4.5 10*3/uL (ref 4.0–10.5)

## 2018-08-17 LAB — HEMOGLOBIN A1C: Hgb A1c MFr Bld: 5.9 % (ref 4.6–6.5)

## 2018-08-17 LAB — MICROALBUMIN / CREATININE URINE RATIO
Creatinine,U: 158.6 mg/dL
MICROALB UR: 10.3 mg/dL — AB (ref 0.0–1.9)
MICROALB/CREAT RATIO: 6.5 mg/g (ref 0.0–30.0)

## 2018-08-17 LAB — LIPID PANEL
CHOL/HDL RATIO: 3
CHOLESTEROL: 198 mg/dL (ref 0–200)
HDL: 62.4 mg/dL (ref >39.00)
LDL Cholesterol: 113 mg/dL — ABNORMAL HIGH (ref 0–99)
NonHDL: 135.4
TRIGLYCERIDES: 113 mg/dL (ref 0.0–149.0)
VLDL: 22.6 mg/dL (ref 0.0–40.0)

## 2018-08-17 MED ORDER — ISOSORBIDE MONONITRATE ER 30 MG PO TB24: 30.0000 mg | ORAL_TABLET | Freq: Every day | ORAL | 3 refills | Status: DC

## 2018-08-17 MED ORDER — AMLODIPINE BESYLATE 5 MG PO TABS: 5.0000 mg | ORAL_TABLET | Freq: Every day | ORAL | 3 refills | Status: DC

## 2018-08-17 NOTE — Assessment & Plan Note (Signed)
Check CMET. 

## 2018-08-17 NOTE — Assessment & Plan Note (Signed)
Check lipid panel.  Continue atorvastatin 40 mg daily. 

## 2018-08-17 NOTE — Assessment & Plan Note (Signed)
At goal.  Continue Norvasc 5 mg daily and Imdur 30 mg daily.  Check CBC and CMET.

## 2018-08-17 NOTE — Assessment & Plan Note (Signed)
Check A1c.  Continue Januvia 50 mg daily.  Check urine microalbumin.  Follow-up with me in 3 to 6 months depending on A1c.

## 2018-08-17 NOTE — Assessment & Plan Note (Signed)
Improved.  Continue Lexapro 10 mg daily.  Follow-up in 3 to 6 months.

## 2018-08-17 NOTE — Patient Instructions (Signed)
It was very nice to see you today!  I am glad that you are doing well! Keep up the good work!  No medication changes today.  We will check blood work and a urine sample.  Come back to see me in 6 months, or sooner as needed.   Take care, Dr Jimmey RalphParker

## 2018-08-17 NOTE — Assessment & Plan Note (Signed)
Improved.  Continue Lexapro 10 mg daily and Klonopin 1 mg 3 times daily as needed.

## 2018-08-17 NOTE — Progress Notes (Signed)
   Subjective:  Julie Gilbert is a 67 y.o. female who presents today with a chief complaint of depression and anxiety follow-up.   HPI:  Depression/Anxiety, established problem, Stable Patient last seen about 3 months ago.  At that time we started her on Lexapro 10 mg daily.  She was continued on Klonopin 1 mg 3 times daily as needed.  Symptoms have significantly improved since her last visit.  No notable side effects.  Depression screen PHQ 2/9 08/17/2018  Decreased Interest 2  Down, Depressed, Hopeless 2  PHQ - 2 Score 4  Altered sleeping 0  Tired, decreased energy 2  Change in appetite 3  Feeling bad or failure about yourself  2  Trouble concentrating 0  Moving slowly or fidgety/restless 0  Suicidal thoughts 0  PHQ-9 Score 11    GAD 7 : Generalized Anxiety Score 08/17/2018  Nervous, Anxious, on Edge 2  Control/stop worrying 3  Worry too much - different things 3  Trouble relaxing 2  Restless 2  Easily annoyed or irritable 2  Afraid - awful might happen 2  Total GAD 7 Score 16  Anxiety Difficulty Somewhat difficult   T2DM Patient is currently on Januvia 50 mg daily.  She is tolerating this well.  No reported polyuria polydipsia.  HTN On amlodipine 5mg  daily and imdur 30mg  daily.  Tolerating well.  No reported chest pain or shortness of breath.  Dyslipidemia On Lipitor 40 mg daily and tolerating well.  No reported myalgias.  ROS: Per HPI  PMH: She reports that she quit smoking about 5 years ago. She has never used smokeless tobacco. She reports that she drinks about 1.0 standard drinks of alcohol per week. She reports that she does not use drugs.  Objective:  Physical Exam: BP 136/84 (BP Location: Right Arm, Patient Position: Sitting, Cuff Size: Normal)   Pulse 70   Temp 97.8 F (36.6 C) (Oral)   Ht 5\' 8"  (1.727 m)   Wt 188 lb 12.8 oz (85.6 kg)   LMP  (LMP Unknown) Comment: Menopausal  SpO2 95%   BMI 28.71 kg/m   Gen: NAD, resting comfortably CV: RRR  with no murmurs appreciated Pulm: NWOB, CTAB with no crackles, wheezes, or rhochi MSK: No edema, cyanosis, or clubbing noted Skin: Warm, dry Neuro: Grossly normal, moves all extremities Psych: Normal affect and thought content  Assessment/Plan:  Type 2 diabetes mellitus with neurological complications (HCC) Check A1c.  Continue Januvia 50 mg daily.  Check urine microalbumin.  Follow-up with me in 3 to 6 months depending on A1c.  Moderate episode of recurrent major depressive disorder (HCC) Improved.  Continue Lexapro 10 mg daily.  Follow-up in 3 to 6 months.  Hypertension associated with diabetes (HCC) At goal.  Continue Norvasc 5 mg daily and Imdur 30 mg daily.  Check CBC and CMET.  Dyslipidemia associated with type 2 diabetes mellitus ([facility]CC) Check lipid panel.  Continue atorvastatin 40 mg daily.  Anxiety Improved.  Continue Lexapro 10 mg daily and Klonopin 1 mg 3 times daily as needed.  CKD stage 3 due to type 2 diabetes mellitus (HCC) Check CMET.  Preventative health care Declined pneumonia vaccine today.  Check HIV and hepatitis C antibodies.  Katina Degreealeb M. Jimmey RalphParker, MD 08/17/2018 11:49 AM

## 2018-08-18 LAB — HEPATITIS C ANTIBODY
Hepatitis C Ab: NONREACTIVE
SIGNAL TO CUT-OFF: 0.03 (ref <1.00)

## 2018-08-18 LAB — HIV ANTIBODY (ROUTINE TESTING W REFLEX): HIV 1&2 Ab, 4th Generation: NONREACTIVE

## 2018-08-22 NOTE — Progress Notes (Signed)
Please inform patient of the following:  Her blood counts were stable. Here kidney function, liver function, and electrolytes were all stable. HIV and hepatitis C tests were negative. Her A1c and cholesterol levels look good.  Would like for her to continue all of her current medications. Would like for her to come back in 6 months for follow up and to recheck her A1c.  Katina Degreealeb M. Jimmey RalphParker, MD 08/22/2018 7:57 AM

## 2018-09-25 ENCOUNTER — Other Ambulatory Visit: Payer: Self-pay | Admitting: Family Medicine

## 2018-09-27 NOTE — Telephone Encounter (Signed)
Please advise 

## 2018-09-28 ENCOUNTER — Telehealth: Payer: Self-pay | Admitting: Family Medicine

## 2018-09-28 NOTE — Telephone Encounter (Signed)
Please advise, patient has not seen lauren for physical therapy  Copied from CRM [id number]. Topic: General - Other >> Sep 28, 2018 12:47 PM Julie Gilbert wrote: Reason for CRM: pt called in and stated she is ready to setup her physical Therapy for 2020.   She stated she has about 16 more appts with [name]   Best number  [phone]

## 2018-10-01 ENCOUNTER — Ambulatory Visit (INDEPENDENT_AMBULATORY_CARE_PROVIDER_SITE_OTHER): Payer: Medicare Other | Admitting: Family Medicine

## 2018-10-01 ENCOUNTER — Encounter: Payer: Self-pay | Admitting: Family Medicine

## 2018-10-01 VITALS — BP 124/86 | HR 60 | Temp 98.5°F | Ht 68.0 in | Wt 184.2 lb

## 2018-10-01 DIAGNOSIS — I2510 Atherosclerotic heart disease of native coronary artery without angina pectoris: Secondary | ICD-10-CM | POA: Insufficient documentation

## 2018-10-01 DIAGNOSIS — H109 Unspecified conjunctivitis: Secondary | ICD-10-CM | POA: Diagnosis not present

## 2018-10-01 DIAGNOSIS — I25119 Atherosclerotic heart disease of native coronary artery with unspecified angina pectoris: Secondary | ICD-10-CM

## 2018-10-01 HISTORY — DX: Atherosclerotic heart disease of native coronary artery without angina pectoris: I25.10

## 2018-10-01 MED ORDER — POLYMYXIN B-TRIMETHOPRIM 10000-0.1 UNIT/ML-% OP SOLN: 2.0000 [drp] | OPHTHALMIC | 0 refills | Status: DC

## 2018-10-01 MED ORDER — NITROGLYCERIN 0.4 MG SL SUBL: 0.4000 mg | SUBLINGUAL_TABLET | SUBLINGUAL | 3 refills | Status: DC | PRN

## 2018-10-01 NOTE — Assessment & Plan Note (Addendum)
Follows with cardiology.  Sublingual nitroglycerin refilled today.  Continue lipitor 40mg  and aspirin 81mg  daily.

## 2018-10-01 NOTE — Patient Instructions (Signed)
It was very nice to see you today!  Start the drops.  Let me know or see your eye doctor if your symptoms worsen or not improve the next 2 days.  I will refill your nitroglycerin.  I will see you back in 4 months for your scheduled follow-up visit.  Come back to see me sooner as needed.  Take care, Dr [name]

## 2018-10-01 NOTE — Progress Notes (Signed)
   Subjective:  Julie Gilbert is a 68 y.o. female who presents today for same-day appointment with a chief complaint of eye swelling.   HPI:  Left Eye Swelling, Acute problem Started 4 days ago.  Worsened over the last couple of days.  Has mild irritation in her left eye.  Associated symptoms include mild blurred vision and a gritty sensation in her left eye.  She has had some watery discharge to the area.  No fevers or chills.  Eyes are sometimes crusted together in the morning.  No sick contacts.  No rhinorrhea.  No treatments tried. No other obvious alleviating or aggravating factors.   CAD  Follows with cardiology.  Needs refill nitroglycerin tablets today.  No current chest pain.  Symptoms are overall stable.  She is on Lipitor 40 mg and aspirin 81 mg daily.  ROS: Per HPI  PMH: She reports that she quit smoking about 6 years ago. She has never used smokeless tobacco. She reports current alcohol use of about 1.0 standard drinks of alcohol per week. She reports that she does not use drugs.  Objective:  Physical Exam: BP 124/86 (BP Location: Left Arm, Patient Position: Sitting, Cuff Size: Normal)   Pulse 60   Temp 98.5 F (36.9 C) (Oral)   Ht 5\' 8"  (1.727 m)   Wt 184 lb 4 oz (83.6 kg)   LMP  (LMP Unknown) Comment: Menopausal  SpO2 97%   BMI 28.02 kg/m   Gen: NAD, resting comfortably HEENT: Left eye with watery discharge and mild scleral erythema.  Extraocular eye movements intact bilaterally with no pain.  Visual acuity grossly intact.  No surrounding erythema or edema.  Assessment/Plan:  Conjunctivitis Likely viral, though will start Polytrim drops to cover for any bacterial pathology.  No red flags today.  Discussed reasons to return to care and seek emergent care.  Recommended follow-up with her ophthalmologist if symptoms worsen or not improve with above treatment.  Coronary artery disease Follows with cardiology.  Sublingual nitroglycerin refilled today.  Continue lipitor  40mg  and aspirin 81mg  daily.   Katina Degreealeb M. Jimmey RalphParker, MD 10/01/2018 2:25 PM

## 2018-10-02 NOTE — Telephone Encounter (Signed)
Please advise.  Patient has not had PT with [name] in the past.  Are we referring her to PT for some reason?

## 2018-10-03 ENCOUNTER — Other Ambulatory Visit: Payer: Self-pay

## 2018-10-03 NOTE — Telephone Encounter (Signed)
This may have come from [place].  Julie Gilbert. [name], MD 10/03/2018 1:03 PM

## 2018-10-03 NOTE — Telephone Encounter (Signed)
Was Dr. Berline Chough referring this patient to PT?

## 2018-10-09 ENCOUNTER — Other Ambulatory Visit: Payer: Self-pay

## 2018-10-09 MED ORDER — ESCITALOPRAM OXALATE 10 MG PO TABS: 10.0000 mg | ORAL_TABLET | Freq: Every day | ORAL | 1 refills | Status: DC

## 2018-11-20 ENCOUNTER — Other Ambulatory Visit: Payer: Self-pay | Admitting: Neurology

## 2018-11-27 ENCOUNTER — Other Ambulatory Visit: Payer: Self-pay | Admitting: Family Medicine

## 2018-11-27 DIAGNOSIS — H8110 Benign paroxysmal vertigo, unspecified ear: Secondary | ICD-10-CM

## 2018-11-28 ENCOUNTER — Other Ambulatory Visit: Payer: Self-pay

## 2018-11-28 MED ORDER — ATORVASTATIN CALCIUM 40 MG PO TABS: 40.0000 mg | ORAL_TABLET | Freq: Every day | ORAL | 1 refills | Status: DC

## 2018-12-04 ENCOUNTER — Ambulatory Visit: Payer: Medicare Other | Admitting: Sports Medicine

## 2018-12-24 ENCOUNTER — Other Ambulatory Visit: Payer: Self-pay | Admitting: Family Medicine

## 2018-12-25 NOTE — Telephone Encounter (Signed)
Schedule webex for refill.

## 2018-12-27 ENCOUNTER — Encounter: Payer: Self-pay | Admitting: Family Medicine

## 2018-12-27 ENCOUNTER — Ambulatory Visit (INDEPENDENT_AMBULATORY_CARE_PROVIDER_SITE_OTHER): Payer: Medicare Other | Admitting: Family Medicine

## 2018-12-27 VITALS — BP 130/80 | HR 103 | Ht 68.0 in | Wt 180.0 lb

## 2018-12-27 DIAGNOSIS — K219 Gastro-esophageal reflux disease without esophagitis: Secondary | ICD-10-CM

## 2018-12-27 DIAGNOSIS — F419 Anxiety disorder, unspecified: Secondary | ICD-10-CM | POA: Diagnosis not present

## 2018-12-27 DIAGNOSIS — F331 Major depressive disorder, recurrent, moderate: Secondary | ICD-10-CM

## 2018-12-27 DIAGNOSIS — E1149 Type 2 diabetes mellitus with other diabetic neurological complication: Secondary | ICD-10-CM

## 2018-12-27 MED ORDER — CLONAZEPAM 1 MG PO TABS: 1.0000 mg | ORAL_TABLET | Freq: Three times a day (TID) | ORAL | 1 refills | Status: DC

## 2018-12-27 NOTE — Progress Notes (Signed)
    Chief Complaint:  Julie Gilbert is a 68 y.o. female who presents today for a virtual office visit with a chief complaint of anxiety follow up.   Assessment/Plan:  Type 2 diabetes mellitus with neurological complications (HCC) She has been tolerating Januvia 50 mg daily well.  Last A1c 5.9.  Follow-up in the next 2 to 3 months for repeat A1c.  Moderate episode of recurrent major depressive disorder (HCC) Stable.  Continue Lexapro 10 mg daily.  Gastroesophageal reflux disease without esophagitis Stable.  Continue Dexilant 60 mg daily.  Anxiety Stable.  Continue Lexapro 10 mg daily.  Will refill 90-day supply of Klonopin.  Follow-up in 3 to 6 months.    Subjective:  HPI:  Her stable, chronic medical conditions are outlined below:  # T2DM - On Januvia 50mg  daily.  Tolerating well without side effects. - ROS: No reported polyuria or polydipsia  # GERD -On Dexilant 60 mg daily. -ROS: No reported unintentional weight loss or early satiety  # Depression / Anxiety -On Lexapro 10 mg daily and Klonopin 1 mg 3 times daily as needed.  Tolerating these well without side effects. - ROS: No reported SI or HI  ROS: Per HPI  PMH: She reports that she quit smoking about 6 years ago. She has never used smokeless tobacco. She reports current alcohol use of about 1.0 standard drinks of alcohol per week. She reports that she does not use drugs.      Objective/Observations  Physical Exam: Gen: NAD, resting comfortably Pulm: Normal work of breathing Neuro: Grossly normal, moves all extremities Psych: Normal affect and thought content  Virtual Visit via Video   I connected with [name] on 12/27/18 at  9:40 AM EDT by a video enabled telemedicine application and verified that I am speaking with the correct person using two identifiers. I discussed the limitations of evaluation and management by telemedicine and the availability of in person appointments. The patient expressed  understanding and agreed to proceed.  Due to technical limitations, the video application was stopped partway through the encounter.  The rest of the encounter was then completed via audio only.  Patient location: Home Provider location: [place] [facility] [employer] Persons participating in the virtual visit: Myself and patient.     Katina Degree. [name], MD 12/27/2018 10:33 AM

## 2018-12-27 NOTE — Assessment & Plan Note (Signed)
Stable.  Continue Lexapro 10 mg daily. 

## 2018-12-27 NOTE — Assessment & Plan Note (Signed)
Stable.  Continue Dexilant 60 mg daily. 

## 2018-12-27 NOTE — Assessment & Plan Note (Signed)
She has been tolerating Januvia 50 mg daily well.  Last A1c 5.9.  Follow-up in the next 2 to 3 months for repeat A1c.

## 2018-12-27 NOTE — Assessment & Plan Note (Signed)
Stable.  Continue Lexapro 10 mg daily.  Will refill 90-day supply of Klonopin.  Follow-up in 3 to 6 months.

## 2019-01-24 ENCOUNTER — Telehealth: Payer: Self-pay | Admitting: Family Medicine

## 2019-01-24 ENCOUNTER — Other Ambulatory Visit: Payer: Self-pay

## 2019-01-24 MED ORDER — ALBUTEROL SULFATE HFA 108 (90 BASE) MCG/ACT IN AERS: 2.0000 | INHALATION_SPRAY | Freq: Four times a day (QID) | RESPIRATORY_TRACT | 0 refills | Status: DC | PRN

## 2019-01-24 NOTE — Telephone Encounter (Signed)
Copied from CRM [id number]. Topic: Quick Communication - Rx Refill/Question >> Jan 24, 2019 10:46 AM Dalphine Handing A wrote: Medication: Albuterol  Has the patient contacted their pharmacy? yes (Agent: If no, request that the patient contact the pharmacy for the refill.) (Agent: If yes, when and what did the pharmacy advise?Contact PCP  Preferred Pharmacy (with phone number or street name): [facility] [phone] [place], [place] - [address] [phone] (Phone) [phone] (Fax)    Agent: Please be advised that RX refills may take up to 3 business days. We ask that you follow-up with your pharmacy.

## 2019-01-24 NOTE — Telephone Encounter (Signed)
Rx sent to pharmacy   

## 2019-01-24 NOTE — Telephone Encounter (Signed)
See note

## 2019-02-01 ENCOUNTER — Ambulatory Visit: Payer: Medicare Other | Admitting: Neurology

## 2019-02-15 ENCOUNTER — Encounter: Payer: Self-pay | Admitting: Family Medicine

## 2019-02-15 ENCOUNTER — Ambulatory Visit (INDEPENDENT_AMBULATORY_CARE_PROVIDER_SITE_OTHER): Payer: Medicare Other | Admitting: Family Medicine

## 2019-02-15 VITALS — Ht 68.0 in | Wt 183.0 lb

## 2019-02-15 DIAGNOSIS — E1149 Type 2 diabetes mellitus with other diabetic neurological complication: Secondary | ICD-10-CM | POA: Diagnosis not present

## 2019-02-15 DIAGNOSIS — G8929 Other chronic pain: Secondary | ICD-10-CM

## 2019-02-15 DIAGNOSIS — E1159 Type 2 diabetes mellitus with other circulatory complications: Secondary | ICD-10-CM | POA: Diagnosis not present

## 2019-02-15 DIAGNOSIS — M25561 Pain in right knee: Secondary | ICD-10-CM | POA: Diagnosis not present

## 2019-02-15 DIAGNOSIS — I10 Essential (primary) hypertension: Secondary | ICD-10-CM | POA: Diagnosis not present

## 2019-02-15 DIAGNOSIS — M25562 Pain in left knee: Secondary | ICD-10-CM

## 2019-02-15 HISTORY — DX: Other chronic pain: G89.29

## 2019-02-15 NOTE — Assessment & Plan Note (Signed)
Continue Januvia 50 mg daily.  She will come into the office to have A1c checked next week.  If continues to be in the 5-6 range, would consider stopping Januvia completely.

## 2019-02-15 NOTE — Progress Notes (Signed)
   Chief Complaint:  Julie Gilbert is a 68 y.o. female who presents today for a virtual office visit with a chief complaint of knee pain.   Assessment/Plan:  Type 2 diabetes mellitus with neurological complications (HCC) Continue Januvia 50 mg daily.  She will come into the office to have A1c checked next week.  If continues to be in the 5-6 range, would consider stopping Januvia completely.  Hypertension associated with diabetes (HCC) Stable.  Continue current regimen of Norvasc 5 mg daily and  Imdur 30 mg daily.   Chronic pain of both knees Secondary to osteoarthritis.  Letter was written for patient stating that she should have elevated commode to prevent falls.  Will place referral to sports medicine for further evaluation/management.  May benefit from intra-articular joint injection.    Subjective:  HPI:  Bilateral knee pain secondary to osteoarthritis Currently on Voltaren gel.  Does not think that it helps very much.  She has followed a few times at her home due to low sitting commode.  She has asked her landlord replace this however this did not yet been done.  She does not take anti-inflammatories due to underlying chronic kidney disease and cardiovascular issues.  She has tried Tylenol with no significant provement.  Symptoms are significantly impacting her activities of daily living and making it very difficult for her to ambulate around her house.  Her stable, chronic medical conditions are outlined below:  # HTN / CAD / HLD - Follows with cardiology - On Norvasc 5 mg daily, Imdur 30 mg daily, Lipitor 40 mg daily, and aspirin 81 mg daily.  Tolerating all well without side effects. - ROS: No reported chest pain or shortness of breath  # T2DM - On Januvia 50mg  daily.  Tolerating well without side effects. - Home Blood sugars: 90s - ROS: No reported polyuria or polydipsia  ROS: Per HPI  PMH: She reports that she quit smoking about 6 years ago. She has never used  smokeless tobacco. She reports current alcohol use of about 1.0 standard drinks of alcohol per week. She reports that she does not use drugs.      Objective/Observations  Physical Exam: Gen: NAD, resting comfortably Pulm: Normal work of breathing Neuro: Grossly normal, moves all extremities Psych: Normal affect and thought content  Virtual Visit via Video   I connected with [name] on 02/15/19 at 10:00 AM EDT by a video enabled telemedicine application and verified that I am speaking with the correct person using two identifiers. I discussed the limitations of evaluation and management by telemedicine and the availability of in person appointments. The patient expressed understanding and agreed to proceed.   Patient location: Home Provider location: [place] [facility] [employer] Persons participating in the virtual visit: Myself and Patient     Katina Degree. [name], MD 02/15/2019 12:58 PM

## 2019-02-15 NOTE — Assessment & Plan Note (Signed)
Secondary to osteoarthritis.  Letter was written for patient stating that she should have elevated commode to prevent falls.  Will place referral to sports medicine for further evaluation/management.  May benefit from intra-articular joint injection.

## 2019-02-15 NOTE — Assessment & Plan Note (Signed)
Stable.  Continue current regimen of Norvasc 5 mg daily and  Imdur 30 mg daily.

## 2019-02-19 ENCOUNTER — Other Ambulatory Visit (INDEPENDENT_AMBULATORY_CARE_PROVIDER_SITE_OTHER): Payer: Medicare Other

## 2019-02-19 DIAGNOSIS — E1149 Type 2 diabetes mellitus with other diabetic neurological complication: Secondary | ICD-10-CM

## 2019-02-19 LAB — POCT GLYCOSYLATED HEMOGLOBIN (HGB A1C): Hemoglobin A1C: 5.3 % (ref 4.0–5.6)

## 2019-02-19 NOTE — Progress Notes (Signed)
Please inform patient of the following:  Her A1c looks great. It is ok to stop the [place] as we discussed. Would like for her to have A1c rechecked in 6-12 months.  Julie Gilbert. [name], MD 02/19/2019 1:24 PM

## 2019-02-20 ENCOUNTER — Encounter: Payer: Self-pay | Admitting: Family Medicine

## 2019-04-04 ENCOUNTER — Other Ambulatory Visit: Payer: Self-pay | Admitting: Family Medicine

## 2019-04-04 DIAGNOSIS — H8110 Benign paroxysmal vertigo, unspecified ear: Secondary | ICD-10-CM

## 2019-04-18 ENCOUNTER — Other Ambulatory Visit: Payer: Self-pay

## 2019-04-18 MED ORDER — ESCITALOPRAM OXALATE 10 MG PO TABS: 10.0000 mg | ORAL_TABLET | Freq: Every day | ORAL | 1 refills | Status: DC

## 2019-05-09 ENCOUNTER — Telehealth: Payer: Self-pay

## 2019-05-09 NOTE — Telephone Encounter (Signed)
Copied from [place] [id number]. Topic: Referral - Request for Referral >> May 09, 2019  3:02 PM Nils Flack wrote: Has patient seen PCP for this complaint? Yes.   *If NO, is insurance requiring patient see PCP for this issue before PCP can refer them? Referral for which specialty: dermatology Preferred provider/office: [facility] - [facility]  Reason for referral: spots on face

## 2019-05-10 ENCOUNTER — Other Ambulatory Visit: Payer: Self-pay

## 2019-05-10 DIAGNOSIS — L989 Disorder of the skin and subcutaneous tissue, unspecified: Secondary | ICD-10-CM

## 2019-05-10 NOTE — Telephone Encounter (Signed)
Ok with me. Please place any necessary orders. 

## 2019-05-10 NOTE — Telephone Encounter (Signed)
Ok to place referral.

## 2019-05-10 NOTE — Telephone Encounter (Signed)
Referral has been placed. 

## 2019-05-19 ENCOUNTER — Other Ambulatory Visit: Payer: Self-pay | Admitting: Family Medicine

## 2019-05-20 ENCOUNTER — Other Ambulatory Visit: Payer: Self-pay | Admitting: Neurology

## 2019-06-27 ENCOUNTER — Ambulatory Visit (INDEPENDENT_AMBULATORY_CARE_PROVIDER_SITE_OTHER): Payer: Medicare Other | Admitting: Family Medicine

## 2019-06-27 ENCOUNTER — Encounter: Payer: Self-pay | Admitting: Family Medicine

## 2019-06-27 ENCOUNTER — Other Ambulatory Visit: Payer: Self-pay

## 2019-06-27 DIAGNOSIS — E1159 Type 2 diabetes mellitus with other circulatory complications: Secondary | ICD-10-CM

## 2019-06-27 DIAGNOSIS — E785 Hyperlipidemia, unspecified: Secondary | ICD-10-CM

## 2019-06-27 DIAGNOSIS — E1169 Type 2 diabetes mellitus with other specified complication: Secondary | ICD-10-CM

## 2019-06-27 DIAGNOSIS — E1149 Type 2 diabetes mellitus with other diabetic neurological complication: Secondary | ICD-10-CM

## 2019-06-27 DIAGNOSIS — F419 Anxiety disorder, unspecified: Secondary | ICD-10-CM

## 2019-06-27 DIAGNOSIS — J329 Chronic sinusitis, unspecified: Secondary | ICD-10-CM

## 2019-06-27 DIAGNOSIS — J45909 Unspecified asthma, uncomplicated: Secondary | ICD-10-CM

## 2019-06-27 DIAGNOSIS — G43909 Migraine, unspecified, not intractable, without status migrainosus: Secondary | ICD-10-CM

## 2019-06-27 DIAGNOSIS — H8110 Benign paroxysmal vertigo, unspecified ear: Secondary | ICD-10-CM

## 2019-06-27 DIAGNOSIS — I10 Essential (primary) hypertension: Secondary | ICD-10-CM

## 2019-06-27 MED ORDER — CLONAZEPAM 1 MG PO TABS: 1.0000 mg | ORAL_TABLET | Freq: Three times a day (TID) | ORAL | 1 refills | Status: DC

## 2019-06-27 MED ORDER — AZELASTINE HCL 0.1 % NA SOLN: 2.0000 | Freq: Two times a day (BID) | NASAL | 12 refills | Status: DC

## 2019-06-27 MED ORDER — BUDESONIDE-FORMOTEROL FUMARATE 160-4.5 MCG/ACT IN AERO: 2.0000 | INHALATION_SPRAY | Freq: Two times a day (BID) | RESPIRATORY_TRACT | 3 refills | Status: DC

## 2019-06-27 MED ORDER — AMLODIPINE BESYLATE 5 MG PO TABS: 5.0000 mg | ORAL_TABLET | Freq: Every day | ORAL | 3 refills | Status: DC

## 2019-06-27 MED ORDER — GUAIFENESIN-CODEINE 100-10 MG/5ML PO SOLN: 5.0000 mL | Freq: Three times a day (TID) | ORAL | 0 refills | Status: DC | PRN

## 2019-06-27 MED ORDER — MECLIZINE HCL 25 MG PO TABS: ORAL_TABLET | ORAL | 1 refills | Status: DC

## 2019-06-27 MED ORDER — ESCITALOPRAM OXALATE 10 MG PO TABS: 10.0000 mg | ORAL_TABLET | Freq: Every day | ORAL | 1 refills | Status: DC

## 2019-06-27 MED ORDER — AZITHROMYCIN 250 MG PO TABS: ORAL_TABLET | ORAL | 0 refills | Status: DC

## 2019-06-27 MED ORDER — PREDNISONE 20 MG PO TABS: 20.0000 mg | ORAL_TABLET | Freq: Every day | ORAL | 5 refills | Status: DC

## 2019-06-27 MED ORDER — TOPIRAMATE 50 MG PO TABS: 50.0000 mg | ORAL_TABLET | Freq: Two times a day (BID) | ORAL | 1 refills | Status: DC

## 2019-06-27 NOTE — Assessment & Plan Note (Signed)
Refilled amlodipine 

## 2019-06-27 NOTE — Progress Notes (Signed)
    Chief Complaint:  Julie Gilbert is a 68 y.o. female who presents today for a virtual office visit with a chief complaint of cough.   Assessment/Plan:  Cough / Sinusitis No red flags.  Given that symptoms have been persistent for a week, will start antibiotics with azithromycin.  Also start Astelin nasal spray.  Will send in guaifenesin- codeine cough syrup.  Also start low-dose prednisone for the next 5 days.  Encouraged good oral hydration.  Discussed reasons to return to care.  Follow-up as needed.  Type 2 diabetes mellitus with neurological complications (HCC) Continue Januvia 50 mg daily.  Advised her to come in soon to have A1c checked.  Hypertension associated with diabetes ([place]) Refilled amlodipine.  Anxiety Refilled Klonopin 1 mg 3 times daily as needed.  She will continue Lexapro 10 mg daily.  Migraine headache Continue management per neurology.  Will refill Topamax today.  Asthma Slightly worsened.  Will increase dose of Symbicort.  Continue albuterol as needed.    Subjective:  HPI:  Cough Started over a week ago. Associated with rhinorrhea.  Symptoms of been stable over the last few days.  Has taken over-the-counter cough syrup with no improvement.  Some chills.  No fevers.  No sick contacts.  Has a similar thing happen almost every year around this time year.  No other treatments tried.  No other obvious alleviating or aggravating factors.  Her stable, chronic medical conditions are outlined below:  # HTN / CAD / HLD - Follows with cardiology - On Norvasc 5 mg daily, Imdur 30 mg daily, Lipitor 40 mg daily, and aspirin 81 mg daily.  Tolerating all well without side effects. - ROS: No reported chest pain or shortness of breath  # T2DM - On Januvia 50mg  daily.  Tolerating well without side effects. - Home Blood sugars: 90s - ROS: No reported polyuria or polydipsia  # GERD -On Dexilant 60 mg daily. -ROS: No reported unintentional weight loss or early satiety   # Depression / Anxiety -On Lexapro 10 mg daily and Klonopin 1 mg 3 times daily as needed.  Tolerating these well without side effects. - ROS: No reported SI or HI  # Migraines -Follows with neurology - On topamax 50mg  bid  # CKD3  # Chronic Anemia - Folows with oncology  ROS: Per HPI  PMH: She reports that she quit smoking about 6 years ago. She has never used smokeless tobacco. She reports current alcohol use of about 1.0 standard drinks of alcohol per week. She reports that she does not use drugs.      Objective/Observations  Physical Exam: Gen: NAD, resting comfortably Pulm: Normal work of breathing Neuro: Grossly normal, moves all extremities Psych: Normal affect and thought content  Virtual Visit via Video   I connected with Julie Gilbert on 06/27/19 at  4:00 PM EDT by a video enabled telemedicine application and verified that I am speaking with the correct person using two identifiers. I discussed the limitations of evaluation and management by telemedicine and the availability of in person appointments. The patient expressed understanding and agreed to proceed.   Patient location: Home Provider location: [place] participating in the virtual visit: Myself and Patient     Algis Greenhouse. Jerline Pain, MD 06/27/2019 3:25 PM

## 2019-06-27 NOTE — Assessment & Plan Note (Signed)
Continue Januvia 50 mg daily.  Advised her to come in soon to have A1c checked.

## 2019-06-27 NOTE — Assessment & Plan Note (Signed)
Continue management per neurology.  Will refill Topamax today.

## 2019-06-27 NOTE — Assessment & Plan Note (Signed)
Slightly worsened.  Will increase dose of Symbicort.  Continue albuterol as needed.

## 2019-06-27 NOTE — Assessment & Plan Note (Signed)
Refilled Klonopin 1 mg 3 times daily as needed.  She will continue Lexapro 10 mg daily.

## 2019-08-09 ENCOUNTER — Ambulatory Visit (INDEPENDENT_AMBULATORY_CARE_PROVIDER_SITE_OTHER): Payer: Medicare Other

## 2019-08-09 ENCOUNTER — Other Ambulatory Visit: Payer: Self-pay

## 2019-08-09 DIAGNOSIS — Z0000 Encounter for general adult medical examination without abnormal findings: Secondary | ICD-10-CM

## 2019-08-09 NOTE — Patient Instructions (Signed)
Julie Gilbert , Thank you for taking time to come for your Medicare Wellness Visit. I appreciate your ongoing commitment to your health goals. Please review the following plan we discussed and let me know if I can assist you in the future.   Screening recommendations/referrals: Colorectal Screening: we will request records Mammogram: we will request records  Bone Density: recommended   Vision and Dental Exams: Recommended annual ophthalmology exams for early detection of glaucoma and other disorders of the eye Recommended annual dental exams for proper oral hygiene  Diabetic Exams: Diabetic Eye Exam: recommended yearly Diabetic Foot Exam: recommended yearly; at next office visit   Vaccinations: Influenza vaccine:  recommended this fall either at PCP office or through your local pharmacy  Pneumococcal vaccine: recommended  Tdap vaccine: recommended;  Please call your insurance company to determine your out of pocket expense. You may also receive this vaccine at your local pharmacy or Health Dept. Shingles vaccine: Please call your insurance company to determine your out of pocket expense for the Shingrix vaccine. You may receive this vaccine at your local pharmacy.  Advanced directives: Please bring a copy of your POA (Power of Attorney) and/or Living Will to your next appointment.  Goals: Recommend to drink at least 6-8 8oz glasses of water per day and consume a balanced diet rich in fresh fruits and vegetables.   Next appointment: Please schedule your Annual Wellness Visit with your Nurse Health Advisor in one year.  Preventive Care 66 Years and Older, Female Preventive care refers to lifestyle choices and visits with your health care provider that can promote health and wellness. What does preventive care include?  A yearly physical exam. This is also called an annual well check.  Dental exams once or twice a year.  Routine eye exams. Ask your health care provider how often you  should have your eyes checked.  Personal lifestyle choices, including:  Daily care of your teeth and gums.  Regular physical activity.  Eating a healthy diet.  Avoiding tobacco and drug use.  Limiting alcohol use.  Practicing safe sex.  Taking low-dose aspirin every day if recommended by your health care provider.  Taking vitamin and mineral supplements as recommended by your health care provider. What happens during an annual well check? The services and screenings done by your health care provider during your annual well check will depend on your age, overall health, lifestyle risk factors, and family history of disease. Counseling  Your health care provider may ask you questions about your:  Alcohol use.  Tobacco use.  Drug use.  Emotional well-being.  Home and relationship well-being.  Sexual activity.  Eating habits.  History of falls.  Memory and ability to understand (cognition).  Work and work Statistician.  Reproductive health. Screening  You may have the following tests or measurements:  Height, weight, and BMI.  Blood pressure.  Lipid and cholesterol levels. These may be checked every 5 years, or more frequently if you are over 49 years old.  Skin check.  Lung cancer screening. You may have this screening every year starting at age 54 if you have a 30-pack-year history of smoking and currently smoke or have quit within the past 15 years.  Fecal occult blood test (FOBT) of the stool. You may have this test every year starting at age 75.  Flexible sigmoidoscopy or colonoscopy. You may have a sigmoidoscopy every 5 years or a colonoscopy every 10 years starting at age 66.  Hepatitis C blood test.  Hepatitis B blood test.  Sexually transmitted disease (STD) testing.  Diabetes screening. This is done by checking your blood sugar (glucose) after you have not eaten for a while (fasting). You may have this done every 1-3 years.  Bone density  scan. This is done to screen for osteoporosis. You may have this done starting at age 20.  Mammogram. Th20is may be done every 1-2 years. Talk to your health care provider about how often you should have regular mammograms. Talk with your health care provider about your test results, treatment options, and if necessary, the need for more tests. Vaccines  Your health care provider may recommend certain vaccines, such as:  Influenza vaccine. This is recommended every year.  Tetanus, diphtheria, and acellular pertussis (Tdap, Td) vaccine. You may need a Td booster every 10 years.  Zoster vaccine. You may need this after age 66.  Pneumococcal 13-valent conjugate (PCV13) vaccine. One dose is recommended after age 66.  Pneumococcal polysaccharide (PPSV23) vaccine. One dose is recommended after age 66. Talk to your health care provider about which screenings and vaccines you need and how often you need them. This information is not intended to replace advice given to you by your health care provider. Make sure you discuss any questions you have with your health care provider. Document Released: 10/09/2015 Document Revised: 06/01/2016 Document Reviewed: 07/14/2015 Elsevier Interactive Patient Education  2017 [employer]  Fall Prevention in the Home Falls can cause injuries. They can happen to people of all ages. There are many things you can do to make your home safe and to help prevent falls. What can I do on the outside of my home?  Regularly fix the edges of walkways and driveways and fix any cracks.  Remove anything that might make you trip as you walk through a door, such as a raised step or threshold.  Trim any bushes or trees on the path to your home.  Use bright outdoor lighting.  Clear any walking paths of anything that might make someone trip, such as rocks or tools.  Regularly check to see if handrails are loose or broken. Make sure that both sides of any steps have  handrails.  Any raised decks and porches should have guardrails on the edges.  Have any leaves, snow, or ice cleared regularly.  Use sand or salt on walking paths during winter.  Clean up any spills in your garage right away. This includes oil or grease spills. What can I do in the bathroom?  Use night lights.  Install grab bars by the toilet and in the tub and shower. Do not use towel bars as grab bars.  Use non-skid mats or decals in the tub or shower.  If you need to sit down in the shower, use a plastic, non-slip stool.  Keep the floor dry. Clean up any water that spills on the floor as soon as it happens.  Remove soap buildup in the tub or shower regularly.  Attach bath mats securely with double-sided non-slip rug tape.  Do not have throw rugs and other things on the floor that can make you trip. What can I do in the bedroom?  Use night lights.  Make sure that you have a light by your bed that is easy to reach.  Do not use any sheets or blankets that are too big for your bed. They should not hang down onto the floor.  Have a firm chair that has side arms. You can use this  for support while you get dressed.  Do not have throw rugs and other things on the floor that can make you trip. What can I do in the kitchen?  Clean up any spills right away.  Avoid walking on wet floors.  Keep items that you use a lot in easy-to-reach places.  If you need to reach something above you, use a strong step stool that has a grab bar.  Keep electrical cords out of the way.  Do not use floor polish or wax that makes floors slippery. If you must use wax, use non-skid floor wax.  Do not have throw rugs and other things on the floor that can make you trip. What can I do with my stairs?  Do not leave any items on the stairs.  Make sure that there are handrails on both sides of the stairs and use them. Fix handrails that are broken or loose. Make sure that handrails are as long as  the stairways.  Check any carpeting to make sure that it is firmly attached to the stairs. Fix any carpet that is loose or worn.  Avoid having throw rugs at the top or bottom of the stairs. If you do have throw rugs, attach them to the floor with carpet tape.  Make sure that you have a light switch at the top of the stairs and the bottom of the stairs. If you do not have them, ask someone to add them for you. What else can I do to help prevent falls?  Wear shoes that:  Do not have high heels.  Have rubber bottoms.  Are comfortable and fit you well.  Are closed at the toe. Do not wear sandals.  If you use a stepladder:  Make sure that it is fully opened. Do not climb a closed stepladder.  Make sure that both sides of the stepladder are locked into place.  Ask someone to hold it for you, if possible.  Clearly mark and make sure that you can see:  Any grab bars or handrails.  First and last steps.  Where the edge of each step is.  Use tools that help you move around (mobility aids) if they are needed. These include:  Canes.  Walkers.  Scooters.  Crutches.  Turn on the lights when you go into a dark area. Replace any light bulbs as soon as they burn out.  Set up your furniture so you have a clear path. Avoid moving your furniture around.  If any of your floors are uneven, fix them.  If there are any pets around you, be aware of where they are.  Review your medicines with your doctor. Some medicines can make you feel dizzy. This can increase your chance of falling. Ask your doctor what other things that you can do to help prevent falls. This information is not intended to replace advice given to you by your health care provider. Make sure you discuss any questions you have with your health care provider. Document Released: 07/09/2009 Document Revised: 02/18/2016 Document Reviewed: 10/17/2014 Elsevier Interactive Patient Education  2017 [employer].

## 2019-08-09 NOTE — Progress Notes (Signed)
I have personally reviewed the Medicare Annual Wellness Visit and agree with the assessment and plan.  Julie Gilbert. Jerline Pain, MD 08/09/2019 3:59 PM

## 2019-08-09 NOTE — Progress Notes (Signed)
I connected with Julie Gilbert on 08/09/19 at 1445 by a video enabled telemedicine application and verified that I am speaking with the correct person using two identifiers. Location patient: Home Location provider: [facility], Office Persons participating in the virtual visit: Denman George LPN, patient, daughter Julie Gilbert), Franne Forts LPN, and Dr. Dimas Chyle    I discussed the limitations of evaluation and management by telemedicine and the availability of in person appointments. The patient expressed understanding and agreed to proceed.  Subjective:   Julie Gilbert is a 68 y.o. female who presents for an Initial Medicare Annual Wellness Visit.  Review of Systems     Cardiac Risk Factors include: advanced age (>7mn, >>73women);sedentary lifestyle;dyslipidemia;hypertension    Objective:    There were no vitals filed for this visit. There is no height or weight on file to calculate BMI.  Advanced Directives 08/09/2019 07/05/2018 09/27/2017 04/12/2017 01/04/2017 12/21/2016 08/04/2016  Does Patient Have a Medical Advance Directive? No No No Yes No No No  Type of Advance Directive - - - - - - -  Copy of Healthcare Power of Attorney in Chart? - - - - - - No - copy requested  Would patient like information on creating a medical advance directive? No - Patient declined No - Patient declined - - - - -    Current Medications (verified) Outpatient Encounter Medications as of 08/09/2019  Medication Sig  . albuterol (VENTOLIN HFA) 108 (90 Base) MCG/ACT inhaler Inhale 2 puffs into the lungs every 6 (six) hours as needed for wheezing or shortness of breath.  .Marland KitchenamLODipine (NORVASC) 5 MG tablet Take 1 tablet (5 mg total) by mouth daily.  . Ascorbic Acid (VITAMIN C) 1000 MG tablet Take 1,000 mg by mouth daily.  .Marland Kitchenaspirin 81 MG tablet Take 81 mg by mouth daily.  .Marland Kitchenatorvastatin (LIPITOR) 40 MG tablet TAKE 1 TABLET BY MOUTH EVERY DAY (Patient taking differently: Take 80 mg by mouth. )   . azelastine (ASTELIN) 0.1 % nasal spray Place 2 sprays into both nostrils 2 (two) times daily.  . Blood Glucose Monitoring Suppl (ACCU-CHEK AVIVA PLUS) w/Device KIT Use as directed to check blood sugar once daily. E11.9  . Blood Pressure Monitoring (BLOOD PRESSURE KIT) DEVI 1 application by Does not apply route daily. Use to check blood pressure daily  . budesonide-formoterol (SYMBICORT) 160-4.5 MCG/ACT inhaler Inhale 2 puffs into the lungs 2 (two) times daily.  . Cholecalciferol 1000 units tablet Take 1,000 Units by mouth daily.  . clonazePAM (KLONOPIN) 1 MG tablet Take 1 tablet (1 mg total) by mouth 3 (three) times daily.  . COD LIVER OIL PO Take 500 Units by mouth daily.  .Marland KitchenDEXILANT 60 MG capsule Take 60 mg by mouth daily.  . diphenhydrAMINE (BENADRYL) 25 MG tablet Take 25 mg by mouth every 6 (six) hours as needed for sleep.  .Marland KitchenEMGALITY 120 MG/ML SOAJ Inject as directed See admin instructions.  .Marland Kitchenescitalopram (LEXAPRO) 10 MG tablet Take 1 tablet (10 mg total) by mouth daily.  . Fish Oil-Cholecalciferol (FISH OIL + D3) 1000-1000 MG-UNIT CAPS Take 1 tablet by mouth daily.  .Marland Kitchenglucose blood (ACCU-CHEK AVIVA PLUS) test strip Use as instructed to check blood sugar once daily. E11.9  . isosorbide mononitrate (IMDUR) 30 MG 24 hr tablet Take 1 tablet (30 mg total) by mouth daily.  . Lancets (ACCU-CHEK SOFT TOUCH) lancets Use as instructed to check blood sugar once daily E11.9  . Multiple Vitamin (MULTIVITAMIN WITH  MINERALS) TABS tablet Take 1 tablet by mouth daily.  . nitroGLYCERIN (NITROSTAT) 0.4 MG SL tablet Place 1 tablet (0.4 mg total) under the tongue every 5 (five) minutes as needed for chest pain.  . predniSONE (DELTASONE) 20 MG tablet Take 1 tablet (20 mg total) by mouth daily with breakfast.  . promethazine (PHENERGAN) 12.5 MG tablet Take 12.5-25 mg by mouth every 6 (six) hours as needed for nausea or vomiting.   . topiramate (TOPAMAX) 50 MG tablet Take 1 tablet (50 mg total) by mouth 2 (two)  times daily.  . [DISCONTINUED] azithromycin (ZITHROMAX) 250 MG tablet Take 2 tabs day 1, then 1 tab daily  . [DISCONTINUED] guaiFENesin-codeine 100-10 MG/5ML syrup Take 5 mLs by mouth 3 (three) times daily as needed for cough.  . [DISCONTINUED] linaclotide (LINZESS) 145 MCG CAPS capsule Take 1 capsule (145 mcg total) by mouth daily as needed (FOR IBS).  . [DISCONTINUED] meclizine (ANTIVERT) 25 MG tablet TAKE 1 TABLET (25 MG) BY ORAL ROUTE 3 TIMES PER DAY FOR 30   No facility-administered encounter medications on file as of 08/09/2019.     Allergies (verified) Peanuts [peanut oil], Shrimp [shellfish allergy], Latex, Penicillins, and Pineapple   History: Past Medical History:  Diagnosis Date  . Asthma   . Diabetes mellitus without complication ([place])   . Hypertension   . IBS (irritable bowel syndrome)   . Transient ischemic attack (TIA) 06/28/2016   Past Surgical History:  Procedure Laterality Date  . CESAREAN SECTION    . EXPLORATORY LAPAROTOMY     Family History  Problem Relation Age of Onset  . Cancer Maternal Grandfather   . Kidney failure Mother   . Hypertension Mother   . Heart disease Father   . Breast cancer Neg Hx    Social History   Socioeconomic History  . Marital status: Divorced    Spouse name: Not on file  . Number of children: Not on file  . Years of education: Not on file  . Highest education level: Not on file  Occupational History  . Not on file  Social Needs  . Financial resource strain: Not on file  . Food insecurity    Worry: Not on file    Inability: Not on file  . Transportation needs    Medical: Not on file    Non-medical: Not on file  Tobacco Use  . Smoking status: Former Smoker    Quit date: 09/22/2012    Years since quitting: 6.8  . Smokeless tobacco: Never Used  Substance and Sexual Activity  . Alcohol use: Yes    Alcohol/week: 1.0 standard drinks    Types: 1 Glasses of wine per week    Comment: red wine 1-2 x month  . Drug use: No   . Sexual activity: Not Currently  Lifestyle  . Physical activity    Days per week: Not on file    Minutes per session: Not on file  . Stress: Not on file  Relationships  . Social Herbalist on phone: Not on file    Gets together: Not on file    Attends religious service: Not on file    Active member of club or organization: Not on file    Attends meetings of clubs or organizations: Not on file    Relationship status: Not on file  Other Topics Concern  . Not on file  Social History Narrative   Lives with daughter Julie Gilbert)     Tobacco Counseling  Counseling given: Not Answered   Clinical Intake:  Pre-visit preparation completed: Yes  Pain : No/denies pain  Diabetes: No  How often do you need to have someone help you when you read instructions, pamphlets, or other written materials from your doctor or pharmacy?: 2 - Rarely  Interpreter Needed?: No  Information entered by :: Denman George LPN   Activities of Daily Living In your present state of health, do you have any difficulty performing the following activities: 08/09/2019  Hearing? N  Vision? N  Difficulty concentrating or making decisions? N  Walking or climbing stairs? N  Dressing or bathing? N  Doing errands, shopping? N  Preparing Food and eating ? N  Using the Toilet? N  In the past six months, have you accidently leaked urine? N  Do you have problems with loss of bowel control? N  Managing your Medications? N  Managing your Finances? N  Housekeeping or managing your Housekeeping? N  Some recent data might be hidden     Immunizations and Health Maintenance  There is no immunization history on file for this patient. Health Maintenance Due  Topic Date Due  . TETANUS/TDAP  02/19/1972  . FOOT EXAM  12/13/2016  . OPHTHALMOLOGY EXAM  01/04/2018  . DEXA SCAN  02/18/2018  . PNA vac Low Risk Adult (1 of 2 - PCV13) 02/18/2018  . MAMMOGRAM  02/02/2019  . URINE MICROALBUMIN  08/18/2019     Patient Care Team: Vivi Barrack, MD as PCP - General (Family Medicine)  Indicate any recent Medical Services you may have received from other than [facility] providers in the past year (date may be approximate).     Assessment:   This is a routine wellness examination for [place].  Hearing/Vision screen No exam data present  Dietary issues and exercise activities discussed: Current Exercise Habits: The patient does not participate in regular exercise at present  Goals   None    Depression Screen PHQ 2/9 Scores 08/09/2019 08/17/2018 04/12/2017 08/04/2016 07/07/2016 12/14/2015 08/12/2015  PHQ - 2 Score - 4 0 5 3 0 1  PHQ- 9 Score - 11 0 15 14 - -  Exception Documentation Patient refusal - - - - - -    Fall Risk Fall Risk  08/09/2019 06/01/2018 11/17/2017 06/16/2017 12/28/2016  Falls in the past year? 0 Yes Yes Yes Yes  Number falls in past yr: - 2 or more 2 or more 2 or more 1  Injury with Fall? 0 No - - No  Risk Factor Category  - High Fall Risk High Fall Risk - -  Risk for fall due to : - - - - -  Follow up Falls evaluation completed;Education provided;Falls prevention discussed Falls evaluation completed - Falls evaluation completed -    Is the patient's home free of loose throw rugs in walkways, pet beds, electrical cords, etc?   yes      Grab bars in the bathroom? yes      Handrails on the stairs?   yes      Adequate lighting?   yes  Cognitive Function: no cognitive concerns noted at this time   Alert? Yes         Normal Appearance? Yes  Oriented to person? Yes           Place? Yes  Time? Yes  Recall of three objects? Yes  Can perform simple calculations? Yes  Displays appropriate judgment? Yes  Can read the correct time from a watch face?  Yes   Screening Tests Health Maintenance  Topic Date Due  . TETANUS/TDAP  02/19/1972  . FOOT EXAM  12/13/2016  . OPHTHALMOLOGY EXAM  01/04/2018  . DEXA SCAN  02/18/2018  . PNA vac Low Risk Adult (1 of 2 - PCV13) 02/18/2018  .  MAMMOGRAM  02/02/2019  . URINE MICROALBUMIN  08/18/2019  . HEMOGLOBIN A1C  08/22/2019  . COLONOSCOPY  04/26/2028  . Hepatitis C Screening  Completed    Qualifies for Shingles Vaccine? Discussed and patient will check with pharmacy for coverage.  Patient education handout provided   Cancer Screenings: Lung: Low Dose CT Chest recommended if Age 48-80 years, 30 pack-year currently smoking OR have quit w/in 15years. Patient does not qualify. Breast: Up to date on Mammogram? Yes, will request records   Up to date of Bone Density/Dexa? No, patient declines  Colorectal: per patient colon screening up to date; will request records    Plan:  I have personally reviewed and addressed the Medicare Annual Wellness questionnaire and have noted the following in the patient's chart:  A. Medical and social history B. Use of alcohol, tobacco or illicit drugs  C. Current medications and supplements D. Functional ability and status E.  Nutritional status F.  Physical activity G. Advance directives H. List of other physicians I.  Hospitalizations, surgeries, and ER visits in previous 12 months J.  [place] such as hearing and vision if needed, cognitive and depression L. Referrals, records requested, and appointments- requesting mammogram and colonoscopy records   In addition, I have reviewed and discussed with patient certain preventive protocols, quality metrics, and best practice recommendations. A written personalized care plan for preventive services as well as general preventive health recommendations were provided to patient.   Signed,  Denman George, LPN  Nurse Health Advisor   Nurse Notes: no additional

## 2019-08-14 DIAGNOSIS — G43909 Migraine, unspecified, not intractable, without status migrainosus: Secondary | ICD-10-CM | POA: Diagnosis not present

## 2019-08-14 DIAGNOSIS — I639 Cerebral infarction, unspecified: Secondary | ICD-10-CM | POA: Diagnosis not present

## 2019-08-14 DIAGNOSIS — R0683 Snoring: Secondary | ICD-10-CM | POA: Diagnosis not present

## 2019-08-26 ENCOUNTER — Other Ambulatory Visit: Payer: Self-pay | Admitting: Family Medicine

## 2019-08-26 DIAGNOSIS — H8110 Benign paroxysmal vertigo, unspecified ear: Secondary | ICD-10-CM

## 2019-08-27 NOTE — Telephone Encounter (Signed)
Pt requesting refill on Meclizine. Okay to refill.

## 2019-08-29 ENCOUNTER — Other Ambulatory Visit: Payer: Self-pay

## 2019-08-29 ENCOUNTER — Ambulatory Visit (INDEPENDENT_AMBULATORY_CARE_PROVIDER_SITE_OTHER): Payer: Medicare Other | Admitting: Family Medicine

## 2019-08-29 ENCOUNTER — Encounter: Payer: Self-pay | Admitting: Family Medicine

## 2019-08-29 VITALS — BP 161/100 | HR 59 | Temp 97.6°F | Ht 68.0 in | Wt 184.2 lb

## 2019-08-29 DIAGNOSIS — Z0001 Encounter for general adult medical examination with abnormal findings: Secondary | ICD-10-CM

## 2019-08-29 DIAGNOSIS — E663 Overweight: Secondary | ICD-10-CM

## 2019-08-29 DIAGNOSIS — R399 Unspecified symptoms and signs involving the genitourinary system: Secondary | ICD-10-CM | POA: Diagnosis not present

## 2019-08-29 DIAGNOSIS — K219 Gastro-esophageal reflux disease without esophagitis: Secondary | ICD-10-CM

## 2019-08-29 DIAGNOSIS — I10 Essential (primary) hypertension: Secondary | ICD-10-CM

## 2019-08-29 DIAGNOSIS — E1149 Type 2 diabetes mellitus with other diabetic neurological complication: Secondary | ICD-10-CM

## 2019-08-29 DIAGNOSIS — E1159 Type 2 diabetes mellitus with other circulatory complications: Secondary | ICD-10-CM

## 2019-08-29 DIAGNOSIS — N183 Chronic kidney disease, stage 3 unspecified: Secondary | ICD-10-CM

## 2019-08-29 DIAGNOSIS — Z0000 Encounter for general adult medical examination without abnormal findings: Secondary | ICD-10-CM | POA: Diagnosis not present

## 2019-08-29 DIAGNOSIS — E1169 Type 2 diabetes mellitus with other specified complication: Secondary | ICD-10-CM

## 2019-08-29 DIAGNOSIS — E785 Hyperlipidemia, unspecified: Secondary | ICD-10-CM

## 2019-08-29 DIAGNOSIS — E1122 Type 2 diabetes mellitus with diabetic chronic kidney disease: Secondary | ICD-10-CM

## 2019-08-29 DIAGNOSIS — Z6828 Body mass index (BMI) 28.0-28.9, adult: Secondary | ICD-10-CM

## 2019-08-29 DIAGNOSIS — I152 Hypertension secondary to endocrine disorders: Secondary | ICD-10-CM

## 2019-08-29 DIAGNOSIS — F419 Anxiety disorder, unspecified: Secondary | ICD-10-CM

## 2019-08-29 DIAGNOSIS — F331 Major depressive disorder, recurrent, moderate: Secondary | ICD-10-CM

## 2019-08-29 LAB — COMPREHENSIVE METABOLIC PANEL
ALT: 22 U/L (ref 0–35)
AST: 19 U/L (ref 0–37)
Albumin: 4.3 g/dL (ref 3.5–5.2)
Alkaline Phosphatase: 50 U/L (ref 39–117)
BUN: 24 mg/dL — ABNORMAL HIGH (ref 6–23)
CO2: 23 mEq/L (ref 19–32)
Calcium: 9.4 mg/dL (ref 8.4–10.5)
Chloride: 106 mEq/L (ref 96–112)
Creatinine, Ser: 1.56 mg/dL — ABNORMAL HIGH (ref 0.40–1.20)
GFR: 40.11 mL/min — ABNORMAL LOW (ref >60.00)
Glucose, Bld: 109 mg/dL — ABNORMAL HIGH (ref 70–99)
Potassium: 3.9 mEq/L (ref 3.5–5.1)
Sodium: 141 mEq/L (ref 135–145)
Total Bilirubin: 0.4 mg/dL (ref 0.2–1.2)
Total Protein: 7.3 g/dL (ref 6.0–8.3)

## 2019-08-29 LAB — CBC
HCT: 32.7 % — ABNORMAL LOW (ref 36.0–46.0)
Hemoglobin: 10.6 g/dL — ABNORMAL LOW (ref 12.0–15.0)
MCHC: 32.5 g/dL (ref 30.0–36.0)
MCV: 90.1 fl (ref 78.0–100.0)
Platelets: 221 10*3/uL (ref 150.0–400.0)
RBC: 3.63 Mil/uL — ABNORMAL LOW (ref 3.87–5.11)
RDW: 15.7 % — ABNORMAL HIGH (ref 11.5–15.5)
WBC: 9.9 10*3/uL (ref 4.0–10.5)

## 2019-08-29 LAB — URINALYSIS, ROUTINE W REFLEX MICROSCOPIC
Bilirubin Urine: NEGATIVE
Hgb urine dipstick: NEGATIVE
Ketones, ur: NEGATIVE
Nitrite: NEGATIVE
RBC / HPF: NONE SEEN (ref 0–?)
Specific Gravity, Urine: 1.015 (ref 1.000–1.030)
Total Protein, Urine: NEGATIVE
Urine Glucose: NEGATIVE
Urobilinogen, UA: 0.2 (ref 0.0–1.0)
pH: 6 (ref 5.0–8.0)

## 2019-08-29 LAB — LIPID PANEL
Cholesterol: 213 mg/dL — ABNORMAL HIGH (ref 0–200)
HDL: 99 mg/dL (ref >39.00)
LDL Cholesterol: 100 mg/dL — ABNORMAL HIGH (ref 0–99)
NonHDL: 114.25
Total CHOL/HDL Ratio: 2
Triglycerides: 73 mg/dL (ref 0.0–149.0)
VLDL: 14.6 mg/dL (ref 0.0–40.0)

## 2019-08-29 LAB — TSH: TSH: 0.79 u[IU]/mL (ref 0.35–4.50)

## 2019-08-29 LAB — MICROALBUMIN / CREATININE URINE RATIO
Creatinine,U: 96.5 mg/dL
Microalb Creat Ratio: 8.4 mg/g (ref 0.0–30.0)
Microalb, Ur: 8.1 mg/dL — ABNORMAL HIGH (ref 0.0–1.9)

## 2019-08-29 LAB — HEMOGLOBIN A1C: Hgb A1c MFr Bld: 6.2 % (ref 4.6–6.5)

## 2019-08-29 MED ORDER — ALBUTEROL SULFATE HFA 108 (90 BASE) MCG/ACT IN AERS: 2.0000 | INHALATION_SPRAY | Freq: Four times a day (QID) | RESPIRATORY_TRACT | 6 refills | Status: DC | PRN

## 2019-08-29 MED ORDER — NITROGLYCERIN 0.4 MG SL SUBL: 0.4000 mg | SUBLINGUAL_TABLET | SUBLINGUAL | 3 refills | Status: DC | PRN

## 2019-08-29 MED ORDER — TAMSULOSIN HCL 0.4 MG PO CAPS: 0.4000 mg | ORAL_CAPSULE | Freq: Every day | ORAL | 3 refills | Status: DC

## 2019-08-29 NOTE — Progress Notes (Signed)
Chief Complaint:  Julie Gilbert is a 68 y.o. female who presents today for her annual comprehensive physical exam.    Assessment/Plan:  Gastroesophageal reflux disease without esophagitis Stable.  Continue Dexilant 60 mg daily.  Type 2 diabetes mellitus with neurological complications (HCC) Check A1c.  Continue Januvia 50 mg daily.  Hypertension associated with diabetes ([place]) Above goal.  Typically well controlled.  We will continue Norvasc 5 mg daily and Imdur 30 mg daily.  CKD stage 3 due to type 2 diabetes mellitus (HCC) Check C met.  Referral placed to nephrology.  Anxiety Stable.  Continue Lexapro 10 mg daily and Klonopin 1 mg 3 times daily as needed.  Dyslipidemia associated with type 2 diabetes mellitus (HCC) Check lipid panel.  Continue Lipitor 40 mg daily.  Moderate episode of recurrent major depressive disorder (HCC) Stable.  Continue Lexapro 10 mg daily.  LUTS Recommended a urology referral however patient declined.  Will try Flomax.  Likely due to anticholinergic medications.  Body mass index is 28.02 kg/m. / Overweight BMI Metric Follow Up - 08/29/19 0856      BMI Metric Follow Up-Please document annually   BMI Metric Follow Up  Education provided        Preventative Healthcare: Check CBC, C met, TSH, lipid panel.  Will obtain records regarding mammogram, eye exam, and colonoscopy.  Patient Counseling(The following topics were reviewed and/or handout was given):  -Nutrition: Stressed importance of moderation in sodium/caffeine intake, saturated fat and cholesterol, caloric balance, sufficient intake of fresh fruits, vegetables, and fiber.  -Stressed the importance of regular exercise.   -Substance Abuse: Discussed cessation/primary prevention of tobacco, alcohol, or other drug use; driving or other dangerous activities under the influence; availability of treatment for abuse.   -Injury prevention: Discussed safety belts, safety helmets, smoke detector,  smoking near bedding or upholstery.   -Sexuality: Discussed sexually transmitted diseases, partner selection, use of condoms, avoidance of unintended pregnancy and contraceptive alternatives.   -Dental health: Discussed importance of regular tooth brushing, flossing, and dental visits.  -Health maintenance and immunizations reviewed. Please refer to Health maintenance section.  Return to care in 1 year for next preventative visit.     Subjective:  HPI:  She has no acute complaints today.   She has had some issues with emptying her bladder.  Is been going on for years.  No hematuria dysuria.  Occasionally wakes up in the middle the night to urinate.  Symptoms seem to be stable.  Nothing tried.  Her stable, chronic medical conditions are outlined below:   # HTN / CAD / HLD - Follows with cardiology - On Norvasc 5 mg daily, Imdur 30 mg daily, Lipitor 40 mg daily, and aspirin 81 mg daily.  Tolerating all well without side effects. - ROS: No reported chest pain or shortness of breath  # T2DM - On Januvia '50mg'$  daily.  Tolerating well without side effects. - Home Blood sugars: 90s - ROS: No reported polyuria or polydipsia  # GERD -On Dexilant 60 mg daily. -ROS: No reported unintentional weight loss or early satiety  # Depression / Anxiety -On Lexapro 10 mg daily and Klonopin 1 mg 3 times daily as needed.  Tolerating these well without side effects. - ROS: No reported SI or HI  # Migraines -Follows with neurology - On topamax '100mg'$  in the morning and '150mg'$  at night  # Asthma -On Symbicort daily. - Uses albuterol as needed  # CKD3 - Follows with   # Chronic  Anemia - Folows with oncology  Lifestyle Diet: Vegetarian diet.  Exercise: Likes to bowl and golf. Likes playing tennis.   Depression screen PHQ 2/9 08/17/2018  Decreased Interest 2  Down, Depressed, Hopeless 2  PHQ - 2 Score 4  Altered sleeping 0  Tired, decreased energy 2  Change in appetite 3  Feeling bad or  failure about yourself  2  Trouble concentrating 0  Moving slowly or fidgety/restless 0  Suicidal thoughts 0  PHQ-9 Score 11    Health Maintenance Due  Topic Date Due  . FOOT EXAM  12/13/2016  . OPHTHALMOLOGY EXAM  01/04/2018  . DEXA SCAN  02/18/2018  . MAMMOGRAM  02/02/2019  . URINE MICROALBUMIN  08/18/2019  . HEMOGLOBIN A1C  08/22/2019     ROS: Per HPI, otherwise a complete review of systems was negative.   PMH:  The following were reviewed and entered/updated in epic: Past Medical History:  Diagnosis Date  . Asthma   . Diabetes mellitus without complication ([place])   . Hypertension   . IBS (irritable bowel syndrome)   . Transient ischemic attack (TIA) 06/28/2016   Patient Active Problem List   Diagnosis Date Noted  . Asthma 06/27/2019  . Chronic pain of both knees 02/15/2019  . Coronary artery disease 10/01/2018  . Dyslipidemia associated with type 2 diabetes mellitus ([place]) 05/18/2018  . Anxiety 05/18/2018  . CKD stage 3 due to type 2 diabetes mellitus ([place]) 05/18/2018  . Herniated intervertebral disc of lumbar spine 05/01/2017  . Osteoarthritis of spine 02/03/2017  . Moderate episode of recurrent major depressive disorder ([place]) 01/04/2017  . Chronic anemia 06/29/2016  . Gastroesophageal reflux disease without esophagitis 06/18/2015  . Hypertension associated with diabetes ([place]) 05/13/2015  . Type 2 diabetes mellitus with neurological complications ([place]) 32/95/1884  . IBS (irritable bowel syndrome) 05/13/2015  . Migraine headache 05/13/2015  . Insomnia 05/13/2015   Past Surgical History:  Procedure Laterality Date  . CESAREAN SECTION    . EXPLORATORY LAPAROTOMY      Family History  Problem Relation Age of Onset  . Cancer Maternal Grandfather   . Kidney failure Mother   . Hypertension Mother   . Heart disease Father   . Breast cancer Neg Hx     Medications- reviewed and updated Current Outpatient Medications  Medication Sig Dispense Refill  .  albuterol (VENTOLIN HFA) 108 (90 Base) MCG/ACT inhaler Inhale 2 puffs into the lungs every 6 (six) hours as needed for wheezing or shortness of breath. 1 Inhaler 0  . amLODipine (NORVASC) 5 MG tablet Take 1 tablet (5 mg total) by mouth daily. 90 tablet 3  . Ascorbic Acid (VITAMIN C) 1000 MG tablet Take 1,000 mg by mouth daily.    Marland Kitchen aspirin 81 MG tablet Take 81 mg by mouth daily.    Marland Kitchen atorvastatin (LIPITOR) 40 MG tablet TAKE 1 TABLET BY MOUTH EVERY DAY (Patient taking differently: Take 80 mg by mouth. ) 90 tablet 1  . azelastine (ASTELIN) 0.1 % nasal spray Place 2 sprays into both nostrils 2 (two) times daily. 30 mL 12  . Blood Glucose Monitoring Suppl (ACCU-CHEK AVIVA PLUS) w/Device KIT Use as directed to check blood sugar once daily. E11.9 1 kit 0  . Blood Pressure Monitoring (BLOOD PRESSURE KIT) DEVI 1 application by Does not apply route daily. Use to check blood pressure daily 1 Device 0  . budesonide-formoterol (SYMBICORT) 160-4.5 MCG/ACT inhaler Inhale 2 puffs into the lungs 2 (two) times daily. 1 Inhaler 3  .  Cholecalciferol 1000 units tablet Take 1,000 Units by mouth daily.    . clonazePAM (KLONOPIN) 1 MG tablet Take 1 tablet (1 mg total) by mouth 3 (three) times daily. 270 tablet 1  . COD LIVER OIL PO Take 500 Units by mouth daily.    Marland Kitchen DEXILANT 60 MG capsule Take 60 mg by mouth daily.  3  . diphenhydrAMINE (BENADRYL) 25 MG tablet Take 25 mg by mouth every 6 (six) hours as needed for sleep.    Marland Kitchen EMGALITY 120 MG/ML SOAJ Inject as directed See admin instructions.    Marland Kitchen escitalopram (LEXAPRO) 10 MG tablet Take 1 tablet (10 mg total) by mouth daily. 90 tablet 1  . Fish Oil-Cholecalciferol (FISH OIL + D3) 1000-1000 MG-UNIT CAPS Take 1 tablet by mouth daily.    . isosorbide mononitrate (IMDUR) 30 MG 24 hr tablet Take 1 tablet (30 mg total) by mouth daily. 90 tablet 3  . Lancets (ACCU-CHEK SOFT TOUCH) lancets Use as instructed to check blood sugar once daily E11.9 100 each 12  . meclizine  (ANTIVERT) 25 MG tablet TAKE 1 TABLET (25 MG) BY ORAL ROUTE 3 TIMES PER DAY FOR 30 90 tablet 1  . Multiple Vitamin (MULTIVITAMIN WITH MINERALS) TABS tablet Take 1 tablet by mouth daily.    . nitroGLYCERIN (NITROSTAT) 0.4 MG SL tablet Place 1 tablet (0.4 mg total) under the tongue every 5 (five) minutes as needed for chest pain. 50 tablet 3  . predniSONE (DELTASONE) 20 MG tablet Take 1 tablet (20 mg total) by mouth daily with breakfast. 20 tablet 5  . promethazine (PHENERGAN) 12.5 MG tablet Take 12.5-25 mg by mouth every 6 (six) hours as needed for nausea or vomiting.   4  . topiramate (TOPAMAX) 50 MG tablet Take 1 tablet (50 mg total) by mouth 2 (two) times daily. 180 tablet 1  . ezetimibe (ZETIA) 10 MG tablet Take 10 mg by mouth daily.    Marland Kitchen glucose blood (ACCU-CHEK AVIVA PLUS) test strip Use as instructed to check blood sugar once daily. E11.9 100 each 12  . tamsulosin (FLOMAX) 0.4 MG CAPS capsule Take 1 capsule (0.4 mg total) by mouth daily. 30 capsule 3   No current facility-administered medications for this visit.     Allergies-reviewed and updated Allergies  Allergen Reactions  . Peanuts [Peanut Oil] Anaphylaxis  . Shrimp [Shellfish Allergy] Anaphylaxis  . Latex Hives  . Penicillins Hives    Has patient had a PCN reaction causing immediate rash, facial/tongue/throat swelling, SOB or lightheadedness with hypotension: No Has patient had a PCN reaction causing severe rash involving mucus membranes or skin necrosis: No Has patient had a PCN reaction that required hospitalization: No Has patient had a PCN reaction occurring within the last 10 years: No  If all of the above answers are "NO", then may proceed with Cephalosporin use.    Marland Kitchen Pineapple     Scratchy throat, Tongue swelling    Social History   Socioeconomic History  . Marital status: Divorced    Spouse name: Not on file  . Number of children: Not on file  . Years of education: Not on file  . Highest education level: Not  on file  Occupational History  . Not on file  Social Needs  . Financial resource strain: Not on file  . Food insecurity    Worry: Not on file    Inability: Not on file  . Transportation needs    Medical: Not on file    Non-medical:  Not on file  Tobacco Use  . Smoking status: Former Smoker    Quit date: 09/22/2012    Years since quitting: 6.9  . Smokeless tobacco: Never Used  Substance and Sexual Activity  . Alcohol use: Yes    Alcohol/week: 1.0 standard drinks    Types: 1 Glasses of wine per week    Comment: red wine 1-2 x month  . Drug use: No  . Sexual activity: Not Currently  Lifestyle  . Physical activity    Days per week: Not on file    Minutes per session: Not on file  . Stress: Not on file  Relationships  . Social Herbalist on phone: Not on file    Gets together: Not on file    Attends religious service: Not on file    Active member of club or organization: Not on file    Attends meetings of clubs or organizations: Not on file    Relationship status: Not on file  Other Topics Concern  . Not on file  Social History Narrative   Lives with daughter Kennah Hehr)         Objective:  Physical Exam: BP (!) 161/100   Pulse (!) 59   Temp 97.6 F (36.4 C)   Ht '5\' 8"'$  (1.727 m)   Wt 184 lb 4 oz (83.6 kg)   LMP  (LMP Unknown) Comment: Menopausal  SpO2 98%   BMI 28.02 kg/m   Body mass index is 28.02 kg/m. Wt Readings from Last 3 Encounters:  08/29/19 184 lb 4 oz (83.6 kg)  02/15/19 183 lb (83 kg)  12/27/18 180 lb (81.6 kg)   Gen: NAD, resting comfortably HEENT: TMs normal bilaterally. OP clear. No thyromegaly noted.  CV: RRR with no murmurs appreciated Pulm: NWOB, CTAB with no crackles, wheezes, or rhonchi GI: Normal bowel sounds present. Soft, Nontender, Nondistended. MSK: no edema, cyanosis, or clubbing noted Skin: warm, dry Neuro: CN2-12 grossly intact. Strength 5/5 in upper and lower extremities. Reflexes symmetric and intact bilaterally.   Psych: Normal affect and thought content     Bocephus Cali M. Jerline Pain, MD 08/29/2019 8:56 AM

## 2019-08-29 NOTE — Assessment & Plan Note (Signed)
Check lipid panel.  Continue Lipitor 40 mg daily. 

## 2019-08-29 NOTE — Assessment & Plan Note (Signed)
Check C met.  Referral placed to nephrology.

## 2019-08-29 NOTE — Assessment & Plan Note (Signed)
Check A1c.  Continue Januvia 50 mg daily.

## 2019-08-29 NOTE — Patient Instructions (Signed)
It was very nice to see you today!  Please try the flomax.  Keep an eye on your blood pressure and let me know if persistently 140/09 or higher.  We will check blood work and a urine sample.  Come back in 3-6 months, or sooner if needed.   Take care, Dr Jerline Pain  Please try these tips to maintain a healthy lifestyle:   Eat at least 3 REAL meals and 1-2 snacks per day.  Aim for no more than 5 hours between eating.  If you eat breakfast, please do so within one hour of getting up.    Obtain twice as many fruits/vegetables as protein or carbohydrate foods for both lunch and dinner. (Half of each meal should be fruits/vegetables, one quarter protein, and one quarter starchy carbs)   Cut down on sweet beverages. This includes juice, soda, and sweet tea.    Exercise at least 150 minutes every week.    Preventive Care 99 Years and Older, Female Preventive care refers to lifestyle choices and visits with your health care provider that can promote health and wellness. This includes:  A yearly physical exam. This is also called an annual well check.  Regular dental and eye exams.  Immunizations.  Screening for certain conditions.  Healthy lifestyle choices, such as diet and exercise. What can I expect for my preventive care visit? Physical exam Your health care provider will check:  Height and weight. These may be used to calculate body mass index (BMI), which is a measurement that tells if you are at a healthy weight.  Heart rate and blood pressure.  Your skin for abnormal spots. Counseling Your health care provider may ask you questions about:  Alcohol, tobacco, and drug use.  Emotional well-being.  Home and relationship well-being.  Sexual activity.  Eating habits.  History of falls.  Memory and ability to understand (cognition).  Work and work Statistician.  Pregnancy and menstrual history. What immunizations do I need?  Influenza (flu) vaccine  This is  recommended every year. Tetanus, diphtheria, and pertussis (Tdap) vaccine  You may need a Td booster every 10 years. Varicella (chickenpox) vaccine  You may need this vaccine if you have not already been vaccinated. Zoster (shingles) vaccine  You may need this after age 30. Pneumococcal conjugate (PCV13) vaccine  One dose is recommended after age 76. Pneumococcal polysaccharide (PPSV23) vaccine  One dose is recommended after age 60. Measles, mumps, and rubella (MMR) vaccine  You may need at least one dose of MMR if you were born in 1957 or later. You may also need a second dose. Meningococcal conjugate (MenACWY) vaccine  You may need this if you have certain conditions. Hepatitis A vaccine  You may need this if you have certain conditions or if you travel or work in places where you may be exposed to hepatitis A. Hepatitis B vaccine  You may need this if you have certain conditions or if you travel or work in places where you may be exposed to hepatitis B. Haemophilus influenzae type b (Hib) vaccine  You may need this if you have certain conditions. You may receive vaccines as individual doses or as more than one vaccine together in one shot (combination vaccines). Talk with your health care provider about the risks and benefits of combination vaccines. What tests do I need? Blood tests  Lipid and cholesterol levels. These may be checked every 5 years, or more frequently depending on your overall health.  Hepatitis C test.  Hepatitis B test. Screening  Lung cancer screening. You may have this screening every year starting at age 38 if you have a 30-pack-year history of smoking and currently smoke or have quit within the past 15 years.  Colorectal cancer screening. All adults should have this screening starting at age 4 and continuing until age 69. Your health care provider may recommend screening at age 63 if you are at increased risk. You will have tests every 1-10  years, depending on your results and the type of screening test.  Diabetes screening. This is done by checking your blood sugar (glucose) after you have not eaten for a while (fasting). You may have this done every 1-3 years.  Mammogram. This may be done every 1-2 years. Talk with your health care provider about how often you should have regular mammograms.  BRCA-related cancer screening. This may be done if you have a family history of breast, ovarian, tubal, or peritoneal cancers. Other tests  Sexually transmitted disease (STD) testing.  Bone density scan. This is done to screen for osteoporosis. You may have this done starting at age 75. Follow these instructions at home: Eating and drinking  Eat a diet that includes fresh fruits and vegetables, whole grains, lean protein, and low-fat dairy products. Limit your intake of foods with high amounts of sugar, saturated fats, and salt.  Take vitamin and mineral supplements as recommended by your health care provider.  Do not drink alcohol if your health care provider tells you not to drink.  If you drink alcohol: ? Limit how much you have to 0-1 drink a day. ? Be aware of how much alcohol is in your drink. In the [place] one drink equals one 12 oz bottle of beer (355 mL), one 5 oz glass of wine (148 mL), or one 1 oz glass of hard liquor (44 mL). Lifestyle  Take daily care of your teeth and gums.  Stay active. Exercise for at least 30 minutes on 5 or more days each week.  Do not use any products that contain nicotine or tobacco, such as cigarettes, e-cigarettes, and chewing tobacco. If you need help quitting, ask your health care provider.  If you are sexually active, practice safe sex. Use a condom or other form of protection in order to prevent STIs (sexually transmitted infections).  Talk with your health care provider about taking a low-dose aspirin or statin. What's next?  Go to your health care provider once a year for a well  check visit.  Ask your health care provider how often you should have your eyes and teeth checked.  Stay up to date on all vaccines. This information is not intended to replace advice given to you by your health care provider. Make sure you discuss any questions you have with your health care provider. Document Released: 10/09/2015 Document Revised: 09/06/2018 Document Reviewed: 09/06/2018 Elsevier Patient Education  2020 [employer].

## 2019-08-29 NOTE — Assessment & Plan Note (Signed)
Above goal.  Typically well controlled.  We will continue Norvasc 5 mg daily and Imdur 30 mg daily.

## 2019-08-29 NOTE — Assessment & Plan Note (Signed)
Stable.  Continue Lexapro 10 mg daily. 

## 2019-08-29 NOTE — Assessment & Plan Note (Signed)
Stable.  Continue Dexilant 60 mg daily.

## 2019-08-29 NOTE — Assessment & Plan Note (Signed)
Stable.  Continue Lexapro 10 mg daily and Klonopin 1 mg 3 times daily as needed.

## 2019-09-02 ENCOUNTER — Telehealth: Payer: Self-pay | Admitting: Family Medicine

## 2019-09-02 NOTE — Progress Notes (Signed)
Please inform patient of the following:  Blood work and urine sample are all STABLE. Her A1c is up a bit since last time, but still within the goal range. I would like for her to keep up the good work and we can recheck blood work again in a year.   Would like for her to come back in 6 months to recheck A1c.   Algis Greenhouse. Jerline Pain, MD 09/02/2019 12:46 PM

## 2019-09-02 NOTE — Telephone Encounter (Signed)
Do you want her to make virtual app to talk about med changes?

## 2019-09-02 NOTE — Telephone Encounter (Signed)
See below

## 2019-09-02 NOTE — Telephone Encounter (Signed)
Pt stated her tamsulosin (FLOMAX) 0.4 MG CAPS capsule  is not working for her and is also causing her to have dry mouth. She is requesting an alternative medication that is covered by insurance. Please advise.

## 2019-09-03 ENCOUNTER — Other Ambulatory Visit: Payer: Self-pay

## 2019-09-03 MED ORDER — DOXAZOSIN MESYLATE 1 MG PO TABS: 1.0000 mg | ORAL_TABLET | Freq: Every day | ORAL | 2 refills | Status: DC

## 2019-09-03 NOTE — Telephone Encounter (Signed)
Not sure what is covered by her insurance - ok to send in doxazosin 1mg  daily instead.  Algis Greenhouse. Jerline Pain, MD 09/03/2019 8:41 AM

## 2019-09-03 NOTE — Telephone Encounter (Signed)
Rx sent,left voice message for patient to call clinic

## 2019-09-03 NOTE — Telephone Encounter (Signed)
Patient notified

## 2019-09-26 ENCOUNTER — Other Ambulatory Visit: Payer: Self-pay | Admitting: Family Medicine

## 2019-09-26 MED ORDER — BUDESONIDE-FORMOTEROL FUMARATE 160-4.5 MCG/ACT IN AERO: 2.0000 | INHALATION_SPRAY | Freq: Two times a day (BID) | RESPIRATORY_TRACT | 3 refills | Status: DC

## 2019-09-26 NOTE — Telephone Encounter (Signed)
Copied from [place] [id number]. Topic: Quick Communication - Rx Refill/Question >> Sep 26, 2019 10:07 AM Yvette Rack wrote: Pt stated she is completely out of the medication Medication: budesonide-formoterol (SYMBICORT) 160-4.5 MCG/ACT inhaler  Has the patient contacted their pharmacy? yes   Preferred Pharmacy (with phone number or street name): [id number] [address], [address]  Phone: [phone] Fax: [phone]  Agent: Please be advised that RX refills may take up to 3 business days. We ask that you follow-up with your pharmacy.

## 2019-10-01 ENCOUNTER — Ambulatory Visit: Payer: Medicare Other

## 2019-10-08 DIAGNOSIS — I639 Cerebral infarction, unspecified: Secondary | ICD-10-CM | POA: Diagnosis not present

## 2019-10-08 DIAGNOSIS — R0683 Snoring: Secondary | ICD-10-CM | POA: Diagnosis not present

## 2019-10-08 DIAGNOSIS — G43909 Migraine, unspecified, not intractable, without status migrainosus: Secondary | ICD-10-CM | POA: Diagnosis not present

## 2019-10-30 ENCOUNTER — Encounter: Payer: Self-pay | Admitting: Family Medicine

## 2019-10-30 ENCOUNTER — Ambulatory Visit (INDEPENDENT_AMBULATORY_CARE_PROVIDER_SITE_OTHER): Payer: Medicare Other | Admitting: Family Medicine

## 2019-10-30 DIAGNOSIS — I25119 Atherosclerotic heart disease of native coronary artery with unspecified angina pectoris: Secondary | ICD-10-CM | POA: Diagnosis not present

## 2019-10-30 DIAGNOSIS — G43909 Migraine, unspecified, not intractable, without status migrainosus: Secondary | ICD-10-CM

## 2019-10-30 DIAGNOSIS — J45909 Unspecified asthma, uncomplicated: Secondary | ICD-10-CM

## 2019-10-30 NOTE — Assessment & Plan Note (Signed)
Improving since last visit.  She'll continue taking Topamax 50 mg daily and Emgality.

## 2019-10-30 NOTE — Assessment & Plan Note (Signed)
Will be following with new cardiologist soon.  Continue Lipitor 80 mg daily and aspirin 81 mg daily.

## 2019-10-30 NOTE — Progress Notes (Signed)
   Julie Gilbert is a 69 y.o. female who presents today for a virtual office visit.  Assessment/Plan:  Chronic Problems Addressed Today: Migraine headache Improving since last visit.  She'll continue taking Topamax 50 mg daily and Emgality.  Asthma Symptoms are stable however discussed importance of getting Covid vaccine due to underlying asthma.  She has currently on albuterol as needed and Symbicort 2 puffs twice daily.  Coronary artery disease Will be following with new cardiologist soon.  Continue Lipitor 80 mg daily and aspirin 81 mg daily.  Preventative Healthcare Discussed COVID-19 vaccine and safety.  Recommended she get it due to underlying health conditions and reaffirm safety of vaccine.  She would like to visit with her cardiologist before proceeding with COVID-19 vaccine.    Subjective:  HPI:  Patient concerned about COVID-19 vaccine.  She is wondering if she is safe to get it due to her underlying conditions.  She'll be seeing her cardiologist later this month.  Migraines seem to be improving.  She wants to make sure that she is healthy enough to get the vaccine.       Objective/Observations  Physical Exam: Gen: NAD, resting comfortably Pulm: Normal work of breathing Neuro: Grossly normal, moves all extremities Psych: Normal affect and thought content  Virtual Visit via Video   I connected with Julie Gilbert on 10/30/19 at 11:20 AM EST by a video enabled telemedicine application and verified that I am speaking with the correct person using two identifiers. The limitations of evaluation and management by telemedicine and the availability of in person appointments were discussed. The patient expressed understanding and agreed to proceed.   Patient location: Home Provider location: [place] [employer] Persons participating in the virtual visit: Myself and Patient     Katina Degree. [name], MD 10/30/2019 10:38 AM

## 2019-10-30 NOTE — Assessment & Plan Note (Signed)
Symptoms are stable however discussed importance of getting Covid vaccine due to underlying asthma.  She has currently on albuterol as needed and Symbicort 2 puffs twice daily.

## 2019-11-07 ENCOUNTER — Other Ambulatory Visit: Payer: Self-pay

## 2019-11-07 ENCOUNTER — Telehealth: Payer: Self-pay

## 2019-11-07 MED ORDER — TOPIRAMATE 50 MG PO TABS: 50.0000 mg | ORAL_TABLET | Freq: Two times a day (BID) | ORAL | 1 refills | Status: DC

## 2019-11-07 NOTE — Telephone Encounter (Signed)
MEDICATION:topiramate (TOPAMAX) 50 MG tablet  PHARMACY: [id number] [place], [place] - [address] Phone:  [phone]  Fax:  [phone]       Comments: pt is completely out.  **Let patient know to contact pharmacy at the end of the day to make sure medication is ready. **  ** Please notify patient to allow 48-72 hours to process**  **Encourage patient to contact the pharmacy for refills or they can request refills through [facility]**

## 2019-11-07 NOTE — Telephone Encounter (Signed)
Rx sent 

## 2019-11-20 NOTE — Progress Notes (Signed)
Cardiology Office Note:    Date:  11/21/2019   ID:  TAEGAN HAIDER, DOB Aug 18, 1951, MRN [id number]  PCP:  Vivi Barrack, MD  Cardiologist:  No primary care provider on file.   Referring MD: Vivi Barrack, MD   Chief Complaint  Patient presents with  . Coronary Artery Disease  . Advice Only    TIA  . Hypertension  . Hyperlipidemia  . Diabetes Mellitus    History of Present Illness:    MAKINLEY MUSCATO is a 69 y.o. female with a hx of with h/o CAD, hypertension, type 2 diabetes, transient ischemic attack, hyperlipidemia, asthma and CKD stage III, transferring for cardiology care from Dr Severiano Gilbert in [place] a [place] [facility]. Other medical problems are CKD, DM II, hyperlipidemia, essential hypertension, TIA, and asthma.  Mrs. Mackowski is accompanied by her daughter.  I do not have much background information and that found in care everywhere is from [facility].  Other more substantial data is contained at [facility] where coronary angiography was performed.  Her previous cardiologist is noted above and we will need to obtain information as well.  Medical problems that the patient has include type 2 diabetes.  She has had this for several years.  Not currently on ARB/ACE inhibitor therapy but daughter states that there was some issue with kidney function on lisinopril.  Chronic kidney disease is noted on laboratory data captured on blood work from December 2020 where creatinine is 1.6.  She has history of migraine headaches.  She has history of asthma and from time to time has to use prednisone.  She frequently uses inhalers.  She has a prior smoker.  She was told she had a TIA in 2017 (question intracerebral bleeding).  In 2019 the patient began having exertional chest discomfort.  Catheterization was performed at [facility].  Records will need to be obtained.  On medical therapy, exertional angina has apparently significantly improved.  She is interested in stopping  some of her medications.  She believes isosorbide played a major role in improving symptoms.  She has discontinued Imdur on her own and is not having angina.  Past Medical History:  Diagnosis Date  . Anxiety 05/18/2018  . Asthma   . Chronic anemia 06/29/2016  . Chronic pain of both knees 02/15/2019  . CKD stage 3 due to type 2 diabetes mellitus ([place]) 05/18/2018  . Coronary artery disease 10/01/2018  . Diabetes mellitus without complication ([place])   . Dyslipidemia associated with type 2 diabetes mellitus ([place]) 05/18/2018   Lab Results Component Value Date  CHOL 198 08/17/2018  HDL 62.40 08/17/2018  LDLCALC 113 (H) 08/17/2018  TRIG 113.0 08/17/2018  CHOLHDL 3 08/17/2018    . Gastroesophageal reflux disease without esophagitis 06/18/2015  . Herniated intervertebral disc of lumbar spine 05/01/2017  . Hypertension   . Hypertension associated with diabetes ([place]) 05/13/2015  . IBS (irritable bowel syndrome)   . Insomnia 05/13/2015  . Migraine headache 05/13/2015  . Moderate episode of recurrent major depressive disorder ([place]) 01/04/2017  . Osteoarthritis of spine 02/03/2017  . Transient ischemic attack (TIA) 06/28/2016  . Type 2 diabetes mellitus with neurological complications ([place]) 03/25/1600   Lab Results Component Value Date  HGBA1C 5.9 08/17/2018  HGBA1C 5.8 01/04/2017  HGBA1C 6.4 (H) 06/29/2016  Lab Results Component Value Date  MICROALBUR 10.3 (H) 08/17/2018  LDLCALC 113 (H) 08/17/2018  CREATININE 1.56 (H) 08/17/2018      Past Surgical History:  Procedure Laterality Date  .  CESAREAN SECTION    . EXPLORATORY LAPAROTOMY      Current Medications: Current Meds  Medication Sig  . albuterol (VENTOLIN HFA) 108 (90 Base) MCG/ACT inhaler Inhale 2 puffs into the lungs every 6 (six) hours as needed for wheezing or shortness of breath.  Marland Kitchen amLODipine (NORVASC) 5 MG tablet Take 1 tablet (5 mg total) by mouth daily.  . Ascorbic Acid (VITAMIN C) 1000 MG tablet Take 1,000 mg by mouth daily.  Marland Kitchen aspirin 81 MG  tablet Take 81 mg by mouth daily.  Marland Kitchen atorvastatin (LIPITOR) 80 MG tablet Take 80 mg by mouth daily.  . Blood Glucose Monitoring Suppl (ACCU-CHEK AVIVA PLUS) w/Device KIT Use as directed to check blood sugar once daily. E11.9  . Blood Pressure Monitoring (BLOOD PRESSURE KIT) DEVI 1 application by Does not apply route daily. Use to check blood pressure daily  . budesonide-formoterol (SYMBICORT) 160-4.5 MCG/ACT inhaler Inhale 2 puffs into the lungs 2 (two) times daily.  . Cholecalciferol 1000 units tablet Take 1,000 Units by mouth daily.  . clonazePAM (KLONOPIN) 1 MG tablet Take 1 tablet (1 mg total) by mouth 3 (three) times daily.  . COD LIVER OIL PO Take 500 Units by mouth daily.  Marland Kitchen DEXILANT 60 MG capsule Take 60 mg by mouth daily.  Marland Kitchen EMGALITY 120 MG/ML SOAJ Inject as directed See admin instructions.  Marland Kitchen escitalopram (LEXAPRO) 10 MG tablet Take 1 tablet (10 mg total) by mouth daily.  . Fish Oil-Cholecalciferol (FISH OIL + D3) 1000-1000 MG-UNIT CAPS Take 1 tablet by mouth daily.  Marland Kitchen glucose blood (ACCU-CHEK AVIVA PLUS) test strip Use as instructed to check blood sugar once daily. E11.9  . Lancets (ACCU-CHEK SOFT TOUCH) lancets Use as instructed to check blood sugar once daily E11.9  . MAGNESIUM PO Take 420 mg by mouth daily. 1 1/2 daily  . meclizine (ANTIVERT) 25 MG tablet TAKE 1 TABLET (25 MG) BY ORAL ROUTE 3 TIMES PER DAY FOR 30  . Multiple Vitamin (MULTIVITAMIN WITH MINERALS) TABS tablet Take 1 tablet by mouth daily.  . nitroGLYCERIN (NITROSTAT) 0.4 MG SL tablet Place 1 tablet (0.4 mg total) under the tongue every 5 (five) minutes as needed for chest pain.  . predniSONE (DELTASONE) 20 MG tablet Take 1 tablet (20 mg total) by mouth daily with breakfast.  . promethazine (PHENERGAN) 12.5 MG tablet Take 12.5-25 mg by mouth every 6 (six) hours as needed for nausea or vomiting.   . Riboflavin (VITAMIN B-2 PO) Take 250 mg by mouth daily.  Marland Kitchen topiramate (TOPAMAX) 50 MG tablet Take 1 tablet (50 mg total)  by mouth 2 (two) times daily.  . [DISCONTINUED] atorvastatin (LIPITOR) 40 MG tablet TAKE 1 TABLET BY MOUTH EVERY DAY     Allergies:   Peanuts [peanut oil], Shrimp [shellfish allergy], Latex, Penicillins, and Pineapple   Social History   Socioeconomic History  . Marital status: Divorced    Spouse name: Not on file  . Number of children: Not on file  . Years of education: Not on file  . Highest education level: Not on file  Occupational History  . Not on file  Tobacco Use  . Smoking status: Former Smoker    Quit date: 09/22/2012    Years since quitting: 7.1  . Smokeless tobacco: Never Used  Substance and Sexual Activity  . Alcohol use: Yes    Alcohol/week: 1.0 standard drinks    Types: 1 Glasses of wine per week    Comment: red wine 1-2 x month  .  Drug use: No  . Sexual activity: Not Currently  Other Topics Concern  . Not on file  Social History Narrative   Lives with daughter Paraskevi Funez)    Social Determinants of Health   Financial Resource Strain:   . Difficulty of Paying Living Expenses: Not on file  Food Insecurity:   . Worried About Charity fundraiser in the Last Year: Not on file  . Ran Out of Food in the Last Year: Not on file  Transportation Needs:   . Lack of Transportation (Medical): Not on file  . Lack of Transportation (Non-Medical): Not on file  Physical Activity:   . Days of Exercise per Week: Not on file  . Minutes of Exercise per Session: Not on file  Stress:   . Feeling of Stress : Not on file  Social Connections:   . Frequency of Communication with Friends and Family: Not on file  . Frequency of Social Gatherings with Friends and Family: Not on file  . Attends Religious Services: Not on file  . Active Member of Clubs or Organizations: Not on file  . Attends Archivist Meetings: Not on file  . Marital Status: Not on file     Family History: The patient's family history includes Cancer in her maternal grandfather; Heart disease in  her father; Hypertension in her mother; Kidney failure in her mother. There is no history of Breast cancer.  ROS:   Please see the history of present illness.    She has anxiety concerning her health.  She feels asthma is her greatest problem.  She wants to be off medications.  She understands the concept of secondary prevention.  She is changing her care because she did not feel all of her problems were being addressed.  All other systems reviewed and are negative.  EKGs/Labs/Other Studies Reviewed:    The following studies were reviewed today:  ECHOCARDIOGRAM 2017: Study Conclusions   - Left ventricle: The cavity size was normal. Wall thickness was  increased in a pattern of mild LVH. Systolic function was normal.  The estimated ejection fraction was in the range of 60% to 65%.  Wall motion was normal; there were no regional wall motion  abnormalities. Doppler parameters are consistent with abnormal  left ventricular relaxation (grade 1 diastolic dysfunction).  - Aortic valve: There was trivial regurgitation.  - Aortic root: The aortic root was mildly dilated.  - Ascending aorta: The ascending aorta was mildly dilated.  - Mitral valve: Calcified annulus.  - Left atrium: The atrium was mildly dilated.   Impressions:   - Normal LV systolic function; grade 1 diastolic dysfunction; mild  LVH; trace AI; mild LAE; mildly dilated aortic root.   EKG:  EKG normal sinus rhythm, left atrial abnormality, left axis deviation, prominent voltage consistent with LVH.  No old tracing to compare  Recent Labs: 08/29/2019: ALT 22; BUN 24; Creatinine, Ser 1.56; Hemoglobin 10.6; Platelets 221.0; Potassium 3.9; Sodium 141; TSH 0.79  Recent Lipid Panel    Component Value Date/Time   CHOL 213 (H) 08/29/2019 0902   CHOL 160 06/30/2017 1054   TRIG 73.0 08/29/2019 0902   HDL 99.00 08/29/2019 0902   HDL 49 06/30/2017 1054   CHOLHDL 2 08/29/2019 0902   VLDL 14.6 08/29/2019 0902   LDLCALC  100 (H) 08/29/2019 0902   LDLCALC 94 06/30/2017 1054    Physical Exam:    VS:  BP (!) 148/88   Pulse (!) 54   Ht 5'  8" (1.727 m)   Wt 188 lb 12.8 oz (85.6 kg)   LMP  (LMP Unknown) Comment: Menopausal  SpO2 97%   BMI 28.71 kg/m     Wt Readings from Last 3 Encounters:  11/21/19 188 lb 12.8 oz (85.6 kg)  08/29/19 184 lb 4 oz (83.6 kg)  02/15/19 183 lb (83 kg)     GEN: Compatible in appearance with her age.. No acute distress HEENT: Normal NECK: No JVD. LYMPHATICS: No lymphadenopathy CARDIAC: S4 gallop is audible.  RRR without murmur, S3 gallop, or edema. VASCULAR:  Normal Pulses. No bruits. RESPIRATORY:  Clear to auscultation without rales, wheezing or rhonchi  ABDOMEN: Soft, non-tender, non-distended, No pulsatile mass, MUSCULOSKELETAL: No deformity  SKIN: Warm and dry NEUROLOGIC:  Alert and oriented x 3 PSYCHIATRIC:  Normal affect   ASSESSMENT:    1. Coronary artery disease involving native coronary artery of native heart without angina pectoris   2. Hypertension associated with diabetes ([place])   3. Type 2 diabetes mellitus with neurological complications ([place])   4. Chronic anemia   5. Dyslipidemia associated with type 2 diabetes mellitus ([place])   6. Educated about COVID-19 virus infection    PLAN:    In order of problems listed above:  1. We will need to get records from [facility] and [facility].  We had a serious discussion concerning risk factor modification.  She is encouraged to increase physical activity to 30 minutes at least 5days/week. 2. Blood pressure target 130/80 mmHg.  Low-salt diet, weight loss, exercise, and avoiding alcohol and nonsteroidal agents is advised. 3. Hemoglobin A1c target less than 7.  She should be considered for SGLT2 therapy given her underlying cardiac condition and risk factors.  Will consider adding an ARB, perhaps low-dose losartan for renal protection and to further lower blood pressure.  There was apparently an issue with  lisinopril, therefore before starting we will make sure that there was not disproportionate increase in creatinine or potassium on angiotensin receptor blockade. 4. We did not address anemia 5. LDL target less than 70.  Most recent was 100.  PCSK9 therapy was discussed.  Once I get additional data we will decide what to do relative to lipid management. 6. COVID-19 vaccine has been received.  Masking and social distancing is being observed.  Overall education and awareness concerning primary/secondary risk prevention was discussed in detail: LDL less than 70, hemoglobin A1c less than 7, blood pressure target less than 130/80 mmHg, >150 minutes of moderate aerobic activity per week, avoidance of smoking, weight control (via diet and exercise), and continued surveillance/management of/for obstructive sleep apnea.   Clinical follow-up in 1 month.  Obtain data from cardiologist in [place].  Consider adding  Greater than 40 minutes was spent with the patient and her daughter with greater than 50% of the time spent in counseling and educating concerning risk factor modification.  Additional significant effort is required to obtain prior records and review completely to have an accurate assessment of her clinical situation.   Medication Adjustments/Labs and Tests Ordered: Current medicines are reviewed at length with the patient today.  Concerns regarding medicines are outlined above.  Orders Placed This Encounter  Procedures  . Basic metabolic panel  . EKG 12-Lead   Meds ordered this encounter  Medications  . losartan (COZAAR) 25 MG tablet    Sig: Take 1 tablet (25 mg total) by mouth daily.    Dispense:  90 tablet    Refill:  3  Patient Instructions  Medication Instructions:  1) START Losartan '25mg'$  once daily  *If you need a refill on your cardiac medications before your next appointment, please call your pharmacy*  Lab Work: BMET in 1 week  If you have labs (blood work) drawn  today and your tests are completely normal, you will receive your results only by: Marland Kitchen MyChart Message (if you have MyChart) OR . A paper copy in the mail If you have any lab test that is abnormal or we need to change your treatment, we will call you to review the results.  Testing/Procedures: None  Follow-Up: At [facility], you and your health needs are our priority.  As part of our continuing mission to provide you with exceptional heart care, we have created designated Provider Care Teams.  These Care Teams include your primary Cardiologist (physician) and Advanced Practice Providers (APPs -  Physician Assistants and Nurse Practitioners) who all work together to provide you with the care you need, when you need it.  Your next appointment:   2 week(s)  The format for your next appointment:   In Person  Provider:   You may see Dr. Daneen Schick or one of the following Advanced Practice Providers on your designated Care Team:    Truitt Merle, NP  Cecilie Kicks, NP  Kathyrn Drown, NP   Other Instructions      Signed, Sinclair Grooms, MD  11/21/2019 1:05 PM    [place]

## 2019-11-21 ENCOUNTER — Ambulatory Visit (INDEPENDENT_AMBULATORY_CARE_PROVIDER_SITE_OTHER): Payer: Medicare Other | Admitting: Interventional Cardiology

## 2019-11-21 ENCOUNTER — Encounter: Payer: Self-pay | Admitting: Interventional Cardiology

## 2019-11-21 ENCOUNTER — Other Ambulatory Visit: Payer: Self-pay

## 2019-11-21 ENCOUNTER — Telehealth: Payer: Self-pay | Admitting: Interventional Cardiology

## 2019-11-21 VITALS — BP 148/88 | HR 54 | Ht 68.0 in | Wt 188.8 lb

## 2019-11-21 DIAGNOSIS — E1149 Type 2 diabetes mellitus with other diabetic neurological complication: Secondary | ICD-10-CM

## 2019-11-21 DIAGNOSIS — E1159 Type 2 diabetes mellitus with other circulatory complications: Secondary | ICD-10-CM | POA: Diagnosis not present

## 2019-11-21 DIAGNOSIS — I2510 Atherosclerotic heart disease of native coronary artery without angina pectoris: Secondary | ICD-10-CM

## 2019-11-21 DIAGNOSIS — D649 Anemia, unspecified: Secondary | ICD-10-CM | POA: Diagnosis not present

## 2019-11-21 DIAGNOSIS — E1169 Type 2 diabetes mellitus with other specified complication: Secondary | ICD-10-CM | POA: Diagnosis not present

## 2019-11-21 DIAGNOSIS — Z7189 Other specified counseling: Secondary | ICD-10-CM

## 2019-11-21 DIAGNOSIS — E785 Hyperlipidemia, unspecified: Secondary | ICD-10-CM

## 2019-11-21 DIAGNOSIS — I10 Essential (primary) hypertension: Secondary | ICD-10-CM

## 2019-11-21 DIAGNOSIS — I152 Hypertension secondary to endocrine disorders: Secondary | ICD-10-CM

## 2019-11-21 MED ORDER — LOSARTAN POTASSIUM 25 MG PO TABS: 25.0000 mg | ORAL_TABLET | Freq: Every day | ORAL | 3 refills | Status: DC

## 2019-11-21 NOTE — Telephone Encounter (Signed)
[facility] faxed request for medical records to [facility] Dr. Kallie Edward, MD 11/21/19  [place]

## 2019-11-21 NOTE — Patient Instructions (Signed)
Medication Instructions:  1) START Losartan 25mg  once daily  *If you need a refill on your cardiac medications before your next appointment, please call your pharmacy*  Lab Work: BMET in 1 week  If you have labs (blood work) drawn today and your tests are completely normal, you will receive your results only by: MyChart Message (if you have MyChart) OR . A paper copy in the mail If you have any lab test that is abnormal or we need to change your treatment, we will call you to review the results.  Testing/Procedures: None  Follow-Up: At [facility], you and your health needs are our priority.  As part of our continuing mission to provide you with exceptional heart care, we have created designated Provider Care Teams.  These Care Teams include your primary Cardiologist (physician) and Advanced Practice Providers (APPs -  Physician Assistants and Nurse Practitioners) who all work together to provide you with the care you need, when you need it.  Your next appointment:   2 week(s)  The format for your next appointment:   In Person  Provider:   You may see Dr. [facility] or one of the following Advanced Practice Providers on your designated Care Team:    Verdis Prime, NP  Norma Fredrickson, NP  Nada Boozer, NP   Other Instructions

## 2019-11-25 DIAGNOSIS — R0683 Snoring: Secondary | ICD-10-CM | POA: Diagnosis not present

## 2019-11-25 DIAGNOSIS — I639 Cerebral infarction, unspecified: Secondary | ICD-10-CM | POA: Diagnosis not present

## 2019-11-25 DIAGNOSIS — G43909 Migraine, unspecified, not intractable, without status migrainosus: Secondary | ICD-10-CM | POA: Diagnosis not present

## 2019-11-26 ENCOUNTER — Other Ambulatory Visit: Payer: Self-pay | Admitting: Family Medicine

## 2019-11-26 ENCOUNTER — Encounter: Payer: Self-pay | Admitting: Sports Medicine

## 2019-11-26 ENCOUNTER — Ambulatory Visit (INDEPENDENT_AMBULATORY_CARE_PROVIDER_SITE_OTHER): Payer: Medicare Other | Admitting: Sports Medicine

## 2019-11-26 ENCOUNTER — Other Ambulatory Visit: Payer: Self-pay

## 2019-11-26 VITALS — Temp 97.6°F

## 2019-11-26 DIAGNOSIS — L603 Nail dystrophy: Secondary | ICD-10-CM | POA: Diagnosis not present

## 2019-11-26 DIAGNOSIS — M792 Neuralgia and neuritis, unspecified: Secondary | ICD-10-CM

## 2019-11-26 DIAGNOSIS — M79675 Pain in left toe(s): Secondary | ICD-10-CM

## 2019-11-26 MED ORDER — NEOMYCIN-POLYMYXIN-HC 3.5-10000-1 OT SOLN: OTIC | 0 refills | Status: DC

## 2019-11-26 NOTE — Progress Notes (Signed)
Subjective: Julie Gilbert is a 69 y.o. female patient presents to office today complaining of a moderately painful incurvated, swollen tender nail border of the left first toe especially at the medial aspect that is very thickened patient admits to a history of previously having it cut out twice and reports that it just keeps healing back very thick and tender and growing into the skin patient requests for the total nail to be removed because she is tired of dealing with pain in the toe and around the toenail.  Patient denies fever/chills/nausea/vomitting/any other related constitutional symptoms at this time.  Patient Active Problem List   Diagnosis Date Noted  . Asthma 06/27/2019  . Chronic pain of both knees 02/15/2019  . Coronary artery disease 10/01/2018  . Dyslipidemia associated with type 2 diabetes mellitus ([place]) 05/18/2018  . Anxiety 05/18/2018  . CKD stage 3 due to type 2 diabetes mellitus ([place]) 05/18/2018  . Herniated intervertebral disc of lumbar spine 05/01/2017  . Osteoarthritis of spine 02/03/2017  . Moderate episode of recurrent major depressive disorder ([place]) 01/04/2017  . Chronic anemia 06/29/2016  . Gastroesophageal reflux disease without esophagitis 06/18/2015  . Hypertension associated with diabetes ([place]) 05/13/2015  . Type 2 diabetes mellitus with neurological complications ([place]) 94/85/4627  . IBS (irritable bowel syndrome) 05/13/2015  . Migraine headache 05/13/2015  . Insomnia 05/13/2015    Current Outpatient Medications on File Prior to Visit  Medication Sig Dispense Refill  . albuterol (VENTOLIN HFA) 108 (90 Base) MCG/ACT inhaler Inhale 2 puffs into the lungs every 6 (six) hours as needed for wheezing or shortness of breath. 8 g 6  . amLODipine (NORVASC) 5 MG tablet Take 1 tablet (5 mg total) by mouth daily. 90 tablet 3  . Ascorbic Acid (VITAMIN C) 1000 MG tablet Take 1,000 mg by mouth daily.    Marland Kitchen aspirin 81 MG tablet Take 81 mg by mouth daily.    Marland Kitchen  atorvastatin (LIPITOR) 80 MG tablet Take 80 mg by mouth daily.    . Blood Glucose Monitoring Suppl (ACCU-CHEK AVIVA PLUS) w/Device KIT Use as directed to check blood sugar once daily. E11.9 1 kit 0  . Blood Pressure Monitoring (BLOOD PRESSURE KIT) DEVI 1 application by Does not apply route daily. Use to check blood pressure daily 1 Device 0  . budesonide-formoterol (SYMBICORT) 160-4.5 MCG/ACT inhaler Inhale 2 puffs into the lungs 2 (two) times daily. 1 Inhaler 3  . Cholecalciferol 1000 units tablet Take 1,000 Units by mouth daily.    . clonazePAM (KLONOPIN) 1 MG tablet Take 1 tablet (1 mg total) by mouth 3 (three) times daily. 270 tablet 1  . COD LIVER OIL PO Take 500 Units by mouth daily.    Marland Kitchen DEXILANT 60 MG capsule Take 60 mg by mouth daily.  3  . EMGALITY 120 MG/ML SOAJ Inject as directed See admin instructions.    Marland Kitchen escitalopram (LEXAPRO) 10 MG tablet Take 1 tablet (10 mg total) by mouth daily. 90 tablet 1  . Fish Oil-Cholecalciferol (FISH OIL + D3) 1000-1000 MG-UNIT CAPS Take 1 tablet by mouth daily.    Marland Kitchen glucose blood (ACCU-CHEK AVIVA PLUS) test strip Use as instructed to check blood sugar once daily. E11.9 100 each 12  . Lancets (ACCU-CHEK SOFT TOUCH) lancets Use as instructed to check blood sugar once daily E11.9 100 each 12  . losartan (COZAAR) 25 MG tablet Take 1 tablet (25 mg total) by mouth daily. 90 tablet 3  . MAGNESIUM PO Take 420 mg by  mouth daily. 1 1/2 daily    . meclizine (ANTIVERT) 25 MG tablet TAKE 1 TABLET (25 MG) BY ORAL ROUTE 3 TIMES PER DAY FOR 30 90 tablet 1  . Multiple Vitamin (MULTIVITAMIN WITH MINERALS) TABS tablet Take 1 tablet by mouth daily.    . nitroGLYCERIN (NITROSTAT) 0.4 MG SL tablet Place 1 tablet (0.4 mg total) under the tongue every 5 (five) minutes as needed for chest pain. 50 tablet 3  . predniSONE (DELTASONE) 20 MG tablet Take 1 tablet (20 mg total) by mouth daily with breakfast. 20 tablet 5  . promethazine (PHENERGAN) 12.5 MG tablet Take 12.5-25 mg by  mouth every 6 (six) hours as needed for nausea or vomiting.   4  . Riboflavin (VITAMIN B-2 PO) Take 250 mg by mouth daily.    Marland Kitchen topiramate (TOPAMAX) 50 MG tablet Take 1 tablet (50 mg total) by mouth 2 (two) times daily. 180 tablet 1   No current facility-administered medications on file prior to visit.    Allergies  Allergen Reactions  . Peanuts [Peanut Oil] Anaphylaxis  . Shrimp [Shellfish Allergy] Anaphylaxis  . Latex Hives  . Penicillins Hives    Has patient had a PCN reaction causing immediate rash, facial/tongue/throat swelling, SOB or lightheadedness with hypotension: No Has patient had a PCN reaction causing severe rash involving mucus membranes or skin necrosis: No Has patient had a PCN reaction that required hospitalization: No Has patient had a PCN reaction occurring within the last 10 years: No  If all of the above answers are "NO", then may proceed with Cephalosporin use.    Marland Kitchen Pineapple     Scratchy throat, Tongue swelling    Objective:  There were no vitals filed for this visit.  General: Well developed, nourished, in no acute distress, alert and oriented x3   Dermatology: Skin is warm, dry and supple bilateral.  Left hallux nail appears to be  severely incurvated with hyperkeratosis formation at the distal aspects of  the medial nail border with significant thickening of the nail at the medial aspect and discoloration. (-) Erythema. (+) Edema. (-) serosanguous  drainage present. The remaining nails appear unremarkable at this time. There are no open sores, lesions or other signs of infection present.  Vascular: Dorsalis Pedis artery and Posterior Tibial artery pedal pulses are 1/4 bilateral with immedate capillary fill time. Pedal hair growth present. No lower extremity edema.   Neruologic: Grossly intact via light touch bilateral.  Tingling pain to the toes especially worse at the left first toe history of previous nail avulsion procedures to this area with  significant residual pain at palpation of the toenail.  Musculoskeletal: Tenderness to palpation of the left hallux nail worse at the medial margin. Muscular strength within normal limits in all groups bilateral.   Assesement and Plan: Problem List Items Addressed This Visit    None    Visit Diagnoses    Nail dystrophy    -  Primary   Toe pain, left       Neuritis          -Discussed treatment alternatives and plan of care; Explained permanent/temporary nail avulsion and post procedure course to patient. Patient elects for total removal of left first toenail with application of phenol - After a verbal and written consent, injected 3 ml of a 50:50 mixture of 2% plain  lidocaine and 0.5% plain marcaine in a normal hallux block fashion. Next, a  betadine prep was performed. Anesthesia was tested and found  to be appropriate.  The offending left first toenail completely was then incised from the hyponychium to the epinychium. The offending nail border was removed and cleared from the field. The area was curretted for any remaining nail or spicules. Phenol application performed and the area was then flushed with alcohol and dressed with antibiotic cream and a dry sterile dressing. -Patient was instructed to leave the dressing intact for today and begin soaking  in a weak solution of betadine or Epsom salt and water tomorrow. Patient was instructed to  soak for 15-20 minutes each day and apply neosporin/corticosporin and a gauze or bandaid dressing each day. -Patient was instructed to monitor the toe for signs of infection and return to office if toe becomes red, hot or swollen. -Advised ice, elevation, and tylenol or motrin if needed for pain.  -Dispensed postoperative shoe for patient to use to prevent rubbing or irritation to toe procedure site as this is healing on the left -Patient is to return in 2 weeks for follow up care/nail check or sooner if problems arise.  Landis Martins, DPM

## 2019-11-27 ENCOUNTER — Telehealth: Payer: Self-pay | Admitting: Sports Medicine

## 2019-11-27 ENCOUNTER — Other Ambulatory Visit: Payer: Self-pay | Admitting: Sports Medicine

## 2019-11-27 MED ORDER — TRAMADOL HCL 50 MG PO TABS: 50.0000 mg | ORAL_TABLET | Freq: Three times a day (TID) | ORAL | 0 refills | Status: AC | PRN

## 2019-11-27 NOTE — Progress Notes (Signed)
Sent tramadol for pain after toenail avulsion procedure

## 2019-11-27 NOTE — Telephone Encounter (Signed)
Sent tramadol for pain to patient's pharmacy

## 2019-11-27 NOTE — Telephone Encounter (Signed)
I spoke with Julie Gilbert and she states she is having shooting pains in the toe where the toenail was removed. I asked Julie Gilbert if she could take Ibuprofen and she states no due to her IBS. I told Julie Gilbert I would inform Dr. Marylene Land and to check with her pharmacy later today.

## 2019-11-27 NOTE — Telephone Encounter (Signed)
Pt daughter called to say that the pt was in pain from having her toenail removed 11/26/19 w/stover requesting something for pain

## 2019-11-28 ENCOUNTER — Other Ambulatory Visit: Payer: Medicare Other

## 2019-11-28 NOTE — Telephone Encounter (Signed)
error 

## 2019-11-29 ENCOUNTER — Other Ambulatory Visit: Payer: Medicare Other

## 2019-12-06 ENCOUNTER — Other Ambulatory Visit: Payer: Medicare Other | Admitting: *Deleted

## 2019-12-06 ENCOUNTER — Other Ambulatory Visit: Payer: Self-pay

## 2019-12-06 DIAGNOSIS — I152 Hypertension secondary to endocrine disorders: Secondary | ICD-10-CM

## 2019-12-06 DIAGNOSIS — E1159 Type 2 diabetes mellitus with other circulatory complications: Secondary | ICD-10-CM | POA: Diagnosis not present

## 2019-12-06 DIAGNOSIS — I10 Essential (primary) hypertension: Secondary | ICD-10-CM | POA: Diagnosis not present

## 2019-12-06 LAB — BASIC METABOLIC PANEL
BUN/Creatinine Ratio: 8 — ABNORMAL LOW (ref 12–28)
BUN: 13 mg/dL (ref 8–27)
CO2: 18 mmol/L — ABNORMAL LOW (ref 20–29)
Calcium: 9.7 mg/dL (ref 8.7–10.3)
Chloride: 104 mmol/L (ref 96–106)
Creatinine, Ser: 1.59 mg/dL — ABNORMAL HIGH (ref 0.57–1.00)
GFR calc Af Amer: 39 mL/min/{1.73_m2} — ABNORMAL LOW (ref >59)
GFR calc non Af Amer: 34 mL/min/{1.73_m2} — ABNORMAL LOW (ref >59)
Glucose: 90 mg/dL (ref 65–99)
Potassium: 4.2 mmol/L (ref 3.5–5.2)
Sodium: 137 mmol/L (ref 134–144)

## 2019-12-07 NOTE — Progress Notes (Signed)
Cardiology Office Note:    Date:  12/09/2019   ID:  Julie Gilbert, DOB Jan 13, 1951, MRN [id number]  PCP:  Vivi Barrack, MD  Cardiologist:  Sinclair Grooms, MD   Referring MD: Vivi Barrack, MD   Chief Complaint  Patient presents with  . Coronary Artery Disease  . Hypertension  . Hyperlipidemia    History of Present Illness:    Julie Gilbert is a 70 y.o. female with a hx of CAD, hypertension, type 2 diabetes, transient ischemic attack, hyperlipidemia, asthma and CKD stage III, transferring for cardiology care from Dr Severiano Gilbert in [place] [facility]. Other medical problems are CKD, DM II, hyperlipidemia, essential hypertension, TIA, and asthma.  On the initial visit, the blood pressure was elevated.  We adjusted her medication, adding losartan 25 mg/day for kidney protection.  A basic metabolic panel performed later revealed stable creatinine of 1.59 and potassium of 4.2.  She denies angina and shortness of breath.  We have not yet received information from her primary cardiologist in [place].  Blood pressure recordings from home have been much improved with systolic blood pressure less than 329 and diastolic blood pressure less than 80.  Past Medical History:  Diagnosis Date  . Anxiety 05/18/2018  . Asthma   . Chronic anemia 06/29/2016  . Chronic pain of both knees 02/15/2019  . CKD stage 3 due to type 2 diabetes mellitus ([place]) 05/18/2018  . Coronary artery disease 10/01/2018  . Diabetes mellitus without complication ([place])   . Dyslipidemia associated with type 2 diabetes mellitus ([place]) 05/18/2018   Lab Results Component Value Date  CHOL 198 08/17/2018  HDL 62.40 08/17/2018  LDLCALC 113 (H) 08/17/2018  TRIG 113.0 08/17/2018  CHOLHDL 3 08/17/2018    . Gastroesophageal reflux disease without esophagitis 06/18/2015  . Herniated intervertebral disc of lumbar spine 05/01/2017  . Hypertension   . Hypertension associated with diabetes ([place]) 05/13/2015  . IBS  (irritable bowel syndrome)   . Insomnia 05/13/2015  . Migraine headache 05/13/2015  . Moderate episode of recurrent major depressive disorder ([place]) 01/04/2017  . Osteoarthritis of spine 02/03/2017  . Transient ischemic attack (TIA) 06/28/2016  . Type 2 diabetes mellitus with neurological complications ([place]) 06/19/2682   Lab Results Component Value Date  HGBA1C 5.9 08/17/2018  HGBA1C 5.8 01/04/2017  HGBA1C 6.4 (H) 06/29/2016  Lab Results Component Value Date  MICROALBUR 10.3 (H) 08/17/2018  LDLCALC 113 (H) 08/17/2018  CREATININE 1.56 (H) 08/17/2018      Past Surgical History:  Procedure Laterality Date  . CESAREAN SECTION    . EXPLORATORY LAPAROTOMY      Current Medications: Current Meds  Medication Sig  . albuterol (VENTOLIN HFA) 108 (90 Base) MCG/ACT inhaler Inhale 2 puffs into the lungs every 6 (six) hours as needed for wheezing or shortness of breath.  Marland Kitchen amLODipine (NORVASC) 5 MG tablet Take 1 tablet (5 mg total) by mouth daily.  . Ascorbic Acid (VITAMIN C) 1000 MG tablet Take 1,000 mg by mouth daily.  Marland Kitchen aspirin 81 MG tablet Take 81 mg by mouth daily.  Marland Kitchen atorvastatin (LIPITOR) 80 MG tablet Take 80 mg by mouth daily.  . Blood Glucose Monitoring Suppl (ACCU-CHEK AVIVA PLUS) w/Device KIT Use as directed to check blood sugar once daily. E11.9  . Blood Pressure Monitoring (BLOOD PRESSURE KIT) DEVI 1 application by Does not apply route daily. Use to check blood pressure daily  . budesonide-formoterol (SYMBICORT) 160-4.5 MCG/ACT inhaler Inhale 2 puffs into  the lungs 2 (two) times daily.  . Cholecalciferol 1000 units tablet Take 1,000 Units by mouth daily.  . clonazePAM (KLONOPIN) 1 MG tablet Take 1 tablet (1 mg total) by mouth 3 (three) times daily.  . COD LIVER OIL PO Take 500 Units by mouth daily.  Marland Kitchen DEXILANT 60 MG capsule Take 60 mg by mouth daily.  Marland Kitchen EMGALITY 120 MG/ML SOAJ Inject as directed See admin instructions.  Marland Kitchen escitalopram (LEXAPRO) 10 MG tablet Take 1 tablet (10 mg total) by mouth  daily.  . Fish Oil-Cholecalciferol (FISH OIL + D3) 1000-1000 MG-UNIT CAPS Take 1 tablet by mouth daily.  Marland Kitchen glucose blood (ACCU-CHEK AVIVA PLUS) test strip Use as instructed to check blood sugar once daily. E11.9  . Lancets (ACCU-CHEK SOFT TOUCH) lancets Use as instructed to check blood sugar once daily E11.9  . losartan (COZAAR) 25 MG tablet Take 1 tablet (25 mg total) by mouth daily.  Marland Kitchen MAGNESIUM PO Take 420 mg by mouth daily. 1 1/2 daily  . meclizine (ANTIVERT) 25 MG tablet TAKE 1 TABLET (25 MG) BY ORAL ROUTE 3 TIMES PER DAY FOR 30  . Multiple Vitamin (MULTIVITAMIN WITH MINERALS) TABS tablet Take 1 tablet by mouth daily.  Marland Kitchen neomycin-polymyxin-hydrocortisone (CORTISPORIN) OTIC solution Apply 1-2 drops to the toe after soaking toe twice a day  . nitroGLYCERIN (NITROSTAT) 0.4 MG SL tablet Place 1 tablet (0.4 mg total) under the tongue every 5 (five) minutes as needed for chest pain.  . predniSONE (DELTASONE) 20 MG tablet Take 1 tablet (20 mg total) by mouth daily with breakfast.  . promethazine (PHENERGAN) 12.5 MG tablet Take 12.5-25 mg by mouth every 6 (six) hours as needed for nausea or vomiting.   . Riboflavin (VITAMIN B-2 PO) Take 250 mg by mouth daily.  Marland Kitchen topiramate (TOPAMAX) 50 MG tablet Take 1 tablet (50 mg total) by mouth 2 (two) times daily.     Allergies:   Peanuts [peanut oil], Shrimp [shellfish allergy], Latex, Penicillins, and Pineapple   Social History   Socioeconomic History  . Marital status: Divorced    Spouse name: Not on file  . Number of children: Not on file  . Years of education: Not on file  . Highest education level: Not on file  Occupational History  . Not on file  Tobacco Use  . Smoking status: Former Smoker    Quit date: 09/22/2012    Years since quitting: 7.2  . Smokeless tobacco: Never Used  Substance and Sexual Activity  . Alcohol use: Yes    Alcohol/week: 1.0 standard drinks    Types: 1 Glasses of wine per week    Comment: red wine 1-2 x month  .  Drug use: No  . Sexual activity: Not Currently  Other Topics Concern  . Not on file  Social History Narrative   Lives with daughter Ronetta Molla)    Social Determinants of Health   Financial Resource Strain:   . Difficulty of Paying Living Expenses:   Food Insecurity:   . Worried About Charity fundraiser in the Last Year:   . Arboriculturist in the Last Year:   Transportation Needs:   . Film/video editor (Medical):   Marland Kitchen Lack of Transportation (Non-Medical):   Physical Activity:   . Days of Exercise per Week:   . Minutes of Exercise per Session:   Stress:   . Feeling of Stress :   Social Connections:   . Frequency of Communication with Friends and Family:   .  Frequency of Social Gatherings with Friends and Family:   . Attends Religious Services:   . Active Member of Clubs or Organizations:   . Attends Archivist Meetings:   Marland Kitchen Marital Status:      Family History: The patient's family history includes Cancer in her maternal grandfather; Heart disease in her father; Hypertension in her mother; Kidney failure in her mother. There is no history of Breast cancer.  ROS:   Please see the history of present illness.    Afraid to take the COVID-19 vaccine.  Had a reaction to the influenza vaccine.  All other systems reviewed and are negative.  EKGs/Labs/Other Studies Reviewed:    The following studies were reviewed today: No new data  EKG:  EKG the February EKG was reviewed  Recent Labs: 08/29/2019: ALT 22; Hemoglobin 10.6; Platelets 221.0; TSH 0.79 12/06/2019: BUN 13; Creatinine, Ser 1.59; Potassium 4.2; Sodium 137  Recent Lipid Panel    Component Value Date/Time   CHOL 213 (H) 08/29/2019 0902   CHOL 160 06/30/2017 1054   TRIG 73.0 08/29/2019 0902   HDL 99.00 08/29/2019 0902   HDL 49 06/30/2017 1054   CHOLHDL 2 08/29/2019 0902   VLDL 14.6 08/29/2019 0902   LDLCALC 100 (H) 08/29/2019 0902   LDLCALC 94 06/30/2017 1054    Physical Exam:    VS:  BP (!)  144/84   Pulse 62   Ht '5\' 8"'$  (1.727 m)   Wt 192 lb 6.4 oz (87.3 kg)   LMP  (LMP Unknown) Comment: Menopausal  SpO2 98%   BMI 29.25 kg/m     Wt Readings from Last 3 Encounters:  12/09/19 192 lb 6.4 oz (87.3 kg)  11/21/19 188 lb 12.8 oz (85.6 kg)  08/29/19 184 lb 4 oz (83.6 kg)     Blood pressure recordings from home are running between 120/70 and 135/80 mmHg.  GEN: Mast and mildly overweight. No acute distress HEENT: Normal NECK: No JVD. LYMPHATICS: No lymphadenopathy CARDIAC:  RRR without murmur, gallop, or edema. VASCULAR:  Normal Pulses. No bruits. RESPIRATORY:  Clear to auscultation without rales, wheezing or rhonchi  ABDOMEN: Soft, non-tender, non-distended, No pulsatile mass, MUSCULOSKELETAL: No deformity  SKIN: Warm and dry NEUROLOGIC:  Alert and oriented x 3 PSYCHIATRIC:  Normal affect   ASSESSMENT:    1. Coronary artery disease involving native coronary artery of native heart without angina pectoris   2. Hypertension associated with diabetes ([place])   3. Type 2 diabetes mellitus with neurological complications ([place])   4. Dyslipidemia associated with type 2 diabetes mellitus ([place])   5. CKD stage 3 due to type 2 diabetes mellitus ([place])   6. Educated about COVID-19 virus infection    PLAN:    In order of problems listed above:  1. Secondary prevention discussed but not in detail. 2. Improved blood pressure and now on low-dose ARB for kidney protection 3. We did not discuss her diabetes and she understands to follow-up with primary care. 4. LDL target is 70.  Last LDL performed in December was 100.  She is on Lipitor 80.  If she continues to run in this range we will need to increase intensity of therapy perhaps by switching to PCSK9 5. Low-dose ARB added with good blood pressure control 6. Long discussion concerning the vaccine which she now is amenable to take.  I encouraged her to do it as soon as possible and in the meantime practiced 3W's.  Overall education  and awareness concerning secondary risk  prevention was discussed in detail: LDL less than 70, hemoglobin A1c less than 7, blood pressure target less than 130/80 mmHg, >150 minutes of moderate aerobic activity per week, avoidance of smoking, weight control (via diet and exercise), and continued surveillance/management of/for obstructive sleep apnea.    Medication Adjustments/Labs and Tests Ordered: Current medicines are reviewed at length with the patient today.  Concerns regarding medicines are outlined above.  No orders of the defined types were placed in this encounter.  No orders of the defined types were placed in this encounter.   Patient Instructions  Medication Instructions:  Your physician recommends that you continue on your current medications as directed. Please refer to the Current Medication list given to you today.  *If you need a refill on your cardiac medications before your next appointment, please call your pharmacy*   Lab Work: None If you have labs (blood work) drawn today and your tests are completely normal, you will receive your results only by: Marland Kitchen MyChart Message (if you have MyChart) OR . A paper copy in the mail If you have any lab test that is abnormal or we need to change your treatment, we will call you to review the results.   Testing/Procedures: None   Follow-Up: At [facility], you and your health needs are our priority.  As part of our continuing mission to provide you with exceptional heart care, we have created designated Provider Care Teams.  These Care Teams include your primary Cardiologist (physician) and Advanced Practice Providers (APPs -  Physician Assistants and Nurse Practitioners) who all work together to provide you with the care you need, when you need it.  We recommend signing up for the patient portal called "MyChart".  Sign up information is provided on this After Visit Summary.  MyChart is used to connect with patients for Virtual  Visits (Telemedicine).  Patients are able to view lab/test results, encounter notes, upcoming appointments, etc.  Non-urgent messages can be sent to your provider as well.   To learn more about what you can do with MyChart, go to [email or web address].    Your next appointment:   4-6 month(s)  The format for your next appointment:   In Person  Provider:   You may see Sinclair Grooms, MD or one of the following Advanced Practice Providers on your designated Care Team:    Truitt Merle, NP  Cecilie Kicks, NP  Kathyrn Drown, NP    Other Instructions      Signed, Sinclair Grooms, MD  12/09/2019 2:19 PM    [place]

## 2019-12-09 ENCOUNTER — Ambulatory Visit (INDEPENDENT_AMBULATORY_CARE_PROVIDER_SITE_OTHER): Payer: Medicare Other | Admitting: Interventional Cardiology

## 2019-12-09 ENCOUNTER — Other Ambulatory Visit: Payer: Self-pay

## 2019-12-09 ENCOUNTER — Encounter: Payer: Self-pay | Admitting: Interventional Cardiology

## 2019-12-09 VITALS — BP 144/84 | HR 62 | Ht 68.0 in | Wt 192.4 lb

## 2019-12-09 DIAGNOSIS — E1149 Type 2 diabetes mellitus with other diabetic neurological complication: Secondary | ICD-10-CM

## 2019-12-09 DIAGNOSIS — E1159 Type 2 diabetes mellitus with other circulatory complications: Secondary | ICD-10-CM

## 2019-12-09 DIAGNOSIS — E1169 Type 2 diabetes mellitus with other specified complication: Secondary | ICD-10-CM | POA: Diagnosis not present

## 2019-12-09 DIAGNOSIS — I152 Hypertension secondary to endocrine disorders: Secondary | ICD-10-CM

## 2019-12-09 DIAGNOSIS — Z7189 Other specified counseling: Secondary | ICD-10-CM

## 2019-12-09 DIAGNOSIS — E1122 Type 2 diabetes mellitus with diabetic chronic kidney disease: Secondary | ICD-10-CM | POA: Diagnosis not present

## 2019-12-09 DIAGNOSIS — I2510 Atherosclerotic heart disease of native coronary artery without angina pectoris: Secondary | ICD-10-CM

## 2019-12-09 DIAGNOSIS — I10 Essential (primary) hypertension: Secondary | ICD-10-CM

## 2019-12-09 DIAGNOSIS — N183 Chronic kidney disease, stage 3 unspecified: Secondary | ICD-10-CM

## 2019-12-09 DIAGNOSIS — E785 Hyperlipidemia, unspecified: Secondary | ICD-10-CM

## 2019-12-09 NOTE — Patient Instructions (Signed)
Medication Instructions:  Your physician recommends that you continue on your current medications as directed. Please refer to the Current Medication list given to you today.  *If you need a refill on your cardiac medications before your next appointment, please call your pharmacy*   Lab Work: None If you have labs (blood work) drawn today and your tests are completely normal, you will receive your results only by: . MyChart Message (if you have MyChart) OR . A paper copy in the mail If you have any lab test that is abnormal or we need to change your treatment, we will call you to review the results.   Testing/Procedures: None   Follow-Up: At [facility], you and your health needs are our priority.  As part of our continuing mission to provide you with exceptional heart care, we have created designated Provider Care Teams.  These Care Teams include your primary Cardiologist (physician) and Advanced Practice Providers (APPs -  Physician Assistants and Nurse Practitioners) who all work together to provide you with the care you need, when you need it.  We recommend signing up for the patient portal called "MyChart".  Sign up information is provided on this After Visit Summary.  MyChart is used to connect with patients for Virtual Visits (Telemedicine).  Patients are able to view lab/test results, encounter notes, upcoming appointments, etc.  Non-urgent messages can be sent to your provider as well.   To learn more about what you can do with MyChart, go to [email or web address].    Your next appointment:   4-6 month(s)  The format for your next appointment:   In Person  Provider:   You may see Henry W Smith III, MD or one of the following Advanced Practice Providers on your designated Care Team:    Lori Gerhardt, NP  Laura Ingold, NP  Jill McDaniel, NP    Other Instructions   

## 2019-12-10 LAB — HM DIABETES EYE EXAM

## 2019-12-13 DIAGNOSIS — E119 Type 2 diabetes mellitus without complications: Secondary | ICD-10-CM | POA: Diagnosis not present

## 2019-12-21 ENCOUNTER — Other Ambulatory Visit: Payer: Self-pay | Admitting: Family Medicine

## 2019-12-21 DIAGNOSIS — H8110 Benign paroxysmal vertigo, unspecified ear: Secondary | ICD-10-CM

## 2019-12-24 ENCOUNTER — Encounter: Payer: Self-pay | Admitting: Family Medicine

## 2019-12-28 ENCOUNTER — Other Ambulatory Visit: Payer: Self-pay | Admitting: Family Medicine

## 2019-12-31 ENCOUNTER — Telehealth: Payer: Self-pay

## 2019-12-31 NOTE — Telephone Encounter (Signed)
  LAST APPOINTMENT DATE: 12/28/2019   NEXT APPOINTMENT DATE:@6 /11/2019  MEDICATION:clonazePAM (KLONOPIN) 1 MG tablet PHARMACY: [id number] [place], [place] - [address] Phone:  [phone]  Fax:  [phone]       Patient states that she went to the pharmacy yesterday to pick prescription up and the pharmacy told patient they never received anything from provider. **Let patient know to contact pharmacy at the end of the day to make sure medication is ready. **  ** Please notify patient to allow 48-72 hours to process**  **Encourage patient to contact the pharmacy for refills or they can request refills through [facility]**  CLINICAL FILLS OUT ALL BELOW:   LAST REFILL:  QTY:  REFILL DATE:    OTHER COMMENTS:    Okay for refill?  Please advise

## 2020-01-01 ENCOUNTER — Other Ambulatory Visit: Payer: Self-pay | Admitting: Family Medicine

## 2020-01-01 MED ORDER — CLONAZEPAM 1 MG PO TABS: 1.0000 mg | ORAL_TABLET | Freq: Three times a day (TID) | ORAL | 1 refills | Status: DC

## 2020-01-01 NOTE — Addendum Note (Signed)
Addended by: Ardith Dark on: 01/01/2020 09:21 AM   Modules accepted: Orders

## 2020-01-01 NOTE — Telephone Encounter (Signed)
LAST APPOINTMENT DATE: 02/31/2021 NEXT APPOINTMENT DATE:@6 /11/2019  Rx Klonopin 1mg  LAST REFILL:   QTY:

## 2020-01-01 NOTE — Telephone Encounter (Signed)
Rx request 

## 2020-01-01 NOTE — Telephone Encounter (Signed)
Pt called following up on prescription. States she called pharmacy and they still do not have the order. Please advise.

## 2020-01-01 NOTE — Telephone Encounter (Signed)
Sent in Rx earlier today - please make sure pharmacy received it.  Julie Gilbert. [name], MD 01/01/2020 10:45 AM

## 2020-01-02 ENCOUNTER — Other Ambulatory Visit: Payer: Self-pay | Admitting: Family Medicine

## 2020-01-10 ENCOUNTER — Ambulatory Visit: Payer: Medicare Other | Admitting: Podiatry

## 2020-01-14 ENCOUNTER — Other Ambulatory Visit: Payer: Self-pay | Admitting: Family Medicine

## 2020-01-14 DIAGNOSIS — Z1231 Encounter for screening mammogram for malignant neoplasm of breast: Secondary | ICD-10-CM

## 2020-01-17 ENCOUNTER — Other Ambulatory Visit: Payer: Self-pay | Admitting: Family Medicine

## 2020-01-30 DIAGNOSIS — L720 Epidermal cyst: Secondary | ICD-10-CM | POA: Diagnosis not present

## 2020-01-30 DIAGNOSIS — L820 Inflamed seborrheic keratosis: Secondary | ICD-10-CM | POA: Diagnosis not present

## 2020-02-03 ENCOUNTER — Other Ambulatory Visit: Payer: Self-pay | Admitting: Family Medicine

## 2020-02-05 ENCOUNTER — Ambulatory Visit: Payer: Medicare Other

## 2020-02-06 ENCOUNTER — Other Ambulatory Visit: Payer: Self-pay | Admitting: Family Medicine

## 2020-02-06 NOTE — Telephone Encounter (Signed)
Last visit 10/30/2019  Rx DEXILANT 60 MG  Last refill on 09/20/2017

## 2020-02-06 NOTE — Telephone Encounter (Signed)
MEDICATION: dexilant 60 MG  PHARMACY: [facility] [address]  Comments:  Requesting a 3 month supply. Pt asked if this could be on an automatic refill.   **Let patient know to contact pharmacy at the end of the day to make sure medication is ready. **  ** Please notify patient to allow 48-72 hours to process**  **Encourage patient to contact the pharmacy for refills or they can request refills through [facility]**

## 2020-02-11 ENCOUNTER — Ambulatory Visit (INDEPENDENT_AMBULATORY_CARE_PROVIDER_SITE_OTHER): Payer: Medicare Other

## 2020-02-11 ENCOUNTER — Ambulatory Visit (INDEPENDENT_AMBULATORY_CARE_PROVIDER_SITE_OTHER): Payer: Medicare Other | Admitting: Sports Medicine

## 2020-02-11 ENCOUNTER — Other Ambulatory Visit: Payer: Self-pay

## 2020-02-11 ENCOUNTER — Encounter: Payer: Self-pay | Admitting: Sports Medicine

## 2020-02-11 VITALS — Temp 97.8°F

## 2020-02-11 DIAGNOSIS — M79675 Pain in left toe(s): Secondary | ICD-10-CM

## 2020-02-11 DIAGNOSIS — L600 Ingrowing nail: Secondary | ICD-10-CM | POA: Diagnosis not present

## 2020-02-11 DIAGNOSIS — E538 Deficiency of other specified B group vitamins: Secondary | ICD-10-CM | POA: Insufficient documentation

## 2020-02-11 DIAGNOSIS — E611 Iron deficiency: Secondary | ICD-10-CM | POA: Insufficient documentation

## 2020-02-11 DIAGNOSIS — L603 Nail dystrophy: Secondary | ICD-10-CM | POA: Diagnosis not present

## 2020-02-11 DIAGNOSIS — G8929 Other chronic pain: Secondary | ICD-10-CM

## 2020-02-11 DIAGNOSIS — M898X7 Other specified disorders of bone, ankle and foot: Secondary | ICD-10-CM | POA: Diagnosis not present

## 2020-02-11 NOTE — Patient Instructions (Signed)
Pre-Operative Instructions  Congratulations, you have decided to take an important step towards improving your quality of life.  You can be assured that the doctors and staff at [facility] will be with you every step of the way.  Here are some important things you should know:  1. Plan to be at the surgery center/hospital at least 1 (one) hour prior to your scheduled time, unless otherwise directed by the surgical center/hospital staff.  You must have a responsible adult accompany you, remain during the surgery and drive you home.  Make sure you have directions to the surgical center/hospital to ensure you arrive on time. 2. If you are having surgery at [facility] or [facility], you will need a copy of your medical history and physical form from your family physician within one month prior to the date of surgery. We will give you a form for your primary physician to complete.  3. We make every effort to accommodate the date you request for surgery.  However, there are times where surgery dates or times have to be moved.  We will contact you as soon as possible if a change in schedule is required.   4. No aspirin/ibuprofen for one week before surgery.  If you are on aspirin, any non-steroidal anti-inflammatory medications (Mobic, Aleve, Ibuprofen) should not be taken seven (7) days prior to your surgery.  You make take Tylenol for pain prior to surgery.  5. Medications - If you are taking daily heart and blood pressure medications, seizure, reflux, allergy, asthma, anxiety, pain or diabetes medications, make sure you notify the surgery center/hospital before the day of surgery so they can tell you which medications you should take or avoid the day of surgery. 6. No food or drink after midnight the night before surgery unless directed otherwise by surgical center/hospital staff. 7. No alcoholic beverages 24-hours prior to surgery.  No smoking 24-hours prior or 24-hours after  surgery. 8. Wear loose pants or shorts. They should be loose enough to fit over bandages, boots, and casts. 9. Don't wear slip-on shoes. Sneakers are preferred. 10. Bring your boot with you to the surgery center/hospital.  Also bring crutches or a walker if your physician has prescribed it for you.  If you do not have this equipment, it will be provided for you after surgery. 11. If you have not been contacted by the surgery center/hospital by the day before your surgery, call to confirm the date and time of your surgery. 12. Leave-time from work may vary depending on the type of surgery you have.  Appropriate arrangements should be made prior to surgery with your employer. 13. Prescriptions will be provided immediately following surgery by your doctor.  Fill these as soon as possible after surgery and take the medication as directed. Pain medications will not be refilled on weekends and must be approved by the doctor. 14. Remove nail polish on the operative foot and avoid getting pedicures prior to surgery. 15. Wash the night before surgery.  The night before surgery wash the foot and leg well with water and the antibacterial soap provided. Be sure to pay special attention to beneath the toenails and in between the toes.  Wash for at least three (3) minutes. Rinse thoroughly with water and dry well with a towel.  Perform this wash unless told not to do so by your physician.  Enclosed: 1 Ice pack (please put in freezer the night before surgery)   1 Hibiclens skin cleaner     Pre-op instructions  If you have any questions regarding the instructions, please do not hesitate to call our office.  [place]: [address], , [place] [address] -- [phone]  [place]: [address] [place], [place] [address] -- [phone]  [place]: [address], [place], [place] [address] -- [phone]   Website: [email or web address] 

## 2020-02-11 NOTE — Progress Notes (Signed)
Subjective: Julie Gilbert is a 69 y.o. female patient who presents to office for evaluation of pain at left 1st toe around the nail especially at medial 1st toe as well as she has noticed some regrowth of skin as well as nail at the base of the toe. Patient reports that she is concerned because she keeps having problems with this toe. Patient is diabetic and denies any changes with medications or any other issues at this time.  FBS not recorded today.   Patient Active Problem List   Diagnosis Date Noted  . B12 deficiency 02/11/2020  . Iron deficiency 02/11/2020  . Epidermoid cyst of skin 01/30/2020  . Inflamed seborrheic keratosis 01/30/2020  . Asthma 06/27/2019  . Chronic pain of both knees 02/15/2019  . Coronary artery disease 10/01/2018  . Dyslipidemia associated with type 2 diabetes mellitus ([place]) 05/18/2018  . Anxiety 05/18/2018  . CKD stage 3 due to type 2 diabetes mellitus ([place]) 05/18/2018  . Chest pain syndrome 10/27/2017  . Herniated intervertebral disc of lumbar spine 05/01/2017  . Hyperlipidemia 04/05/2017  . Osteoarthritis of spine 02/03/2017  . Moderate episode of recurrent major depressive disorder ([place]) 01/04/2017  . Chronic anemia 06/29/2016  . Gastroesophageal reflux disease without esophagitis 06/18/2015  . Hypertension associated with diabetes ([place]) 05/13/2015  . Type 2 diabetes mellitus with neurological complications ([place]) 61/60/7371  . IBS (irritable bowel syndrome) 05/13/2015  . Migraine headache 05/13/2015  . Insomnia 05/13/2015    Current Outpatient Medications on File Prior to Visit  Medication Sig Dispense Refill  . albuterol (VENTOLIN HFA) 108 (90 Base) MCG/ACT inhaler Inhale 2 puffs into the lungs every 6 (six) hours as needed for wheezing or shortness of breath. 8 g 6  . amLODipine (NORVASC) 5 MG tablet Take 1 tablet (5 mg total) by mouth daily. 90 tablet 3  . Ascorbic Acid (VITAMIN C) 1000 MG tablet Take 1,000 mg by mouth daily.    Marland Kitchen aspirin 81 MG  tablet Take 81 mg by mouth daily.    Marland Kitchen atorvastatin (LIPITOR) 80 MG tablet Take 80 mg by mouth daily.    . Blood Glucose Monitoring Suppl (ACCU-CHEK AVIVA PLUS) w/Device KIT Use as directed to check blood sugar once daily. E11.9 1 kit 0  . Blood Pressure Monitoring (BLOOD PRESSURE KIT) DEVI 1 application by Does not apply route daily. Use to check blood pressure daily 1 Device 0  . budesonide-formoterol (SYMBICORT) 160-4.5 MCG/ACT inhaler Inhale 2 puffs into the lungs 2 (two) times daily. 1 Inhaler 3  . Cholecalciferol 1000 units tablet Take 1,000 Units by mouth daily.    . clonazePAM (KLONOPIN) 1 MG tablet Take 1 tablet (1 mg total) by mouth 3 (three) times daily. 270 tablet 1  . COD LIVER OIL PO Take 500 Units by mouth daily.    Marland Kitchen DEXILANT 60 MG capsule TAKE 1 CAPSULE BY MOUTH EVERY DAY 90 capsule 4  . doxazosin (CARDURA) 1 MG tablet TAKE 1 TABLET BY MOUTH EVERY DAY 90 tablet 0  . EMGALITY 120 MG/ML SOAJ Inject as directed See admin instructions.    Marland Kitchen escitalopram (LEXAPRO) 10 MG tablet TAKE 1 TABLET BY MOUTH EVERY DAY 90 tablet 1  . Fish Oil-Cholecalciferol (FISH OIL + D3) 1000-1000 MG-UNIT CAPS Take 1 tablet by mouth daily.    Marland Kitchen glucose blood (ACCU-CHEK AVIVA PLUS) test strip Use as instructed to check blood sugar once daily. E11.9 100 each 12  . Lancets (ACCU-CHEK SOFT TOUCH) lancets Use as instructed to check blood  sugar once daily E11.9 100 each 12  . losartan (COZAAR) 25 MG tablet Take 1 tablet (25 mg total) by mouth daily. 90 tablet 3  . MAGNESIUM PO Take 420 mg by mouth daily. 1 1/2 daily    . meclizine (ANTIVERT) 25 MG tablet TAKE 1 TABLET (25 MG) BY ORAL ROUTE 3 TIMES PER DAY FOR 30 90 tablet 1  . Multiple Vitamin (MULTIVITAMIN WITH MINERALS) TABS tablet Take 1 tablet by mouth daily.    Marland Kitchen neomycin-polymyxin-hydrocortisone (CORTISPORIN) OTIC solution Apply 1-2 drops to the toe after soaking toe twice a day 10 mL 0  . nitroGLYCERIN (NITROSTAT) 0.4 MG SL tablet PLACE 1 TABLET (0.4 MG  TOTAL) UNDER THE TONGUE EVERY 5 (FIVE) MINUTES AS NEEDED FOR CHEST PAIN. 90 tablet 2  . predniSONE (DELTASONE) 20 MG tablet TAKE 1 TABLET BY MOUTH DAILY WITH BREAKFAST 20 tablet 5  . promethazine (PHENERGAN) 12.5 MG tablet Take 12.5-25 mg by mouth every 6 (six) hours as needed for nausea or vomiting.   4  . Riboflavin (VITAMIN B-2 PO) Take 250 mg by mouth daily.    Marland Kitchen topiramate (TOPAMAX) 50 MG tablet Take 1 tablet (50 mg total) by mouth 2 (two) times daily. 180 tablet 1  . Cod Liver Oil 10 MINIM CAPS Take by mouth.    Marland Kitchen MAGNESIUM PO Take by mouth.     No current facility-administered medications on file prior to visit.    Allergies  Allergen Reactions  . Peanuts [Peanut Oil] Anaphylaxis  . Shrimp [Shellfish Allergy] Anaphylaxis  . Latex Hives  . Penicillins Hives    Has patient had a PCN reaction causing immediate rash, facial/tongue/throat swelling, SOB or lightheadedness with hypotension: No Has patient had a PCN reaction causing severe rash involving mucus membranes or skin necrosis: No Has patient had a PCN reaction that required hospitalization: No Has patient had a PCN reaction occurring within the last 10 years: No  If all of the above answers are "NO", then may proceed with Cephalosporin use.    Marland Kitchen Pineapple     Scratchy throat, Tongue swelling  . Sulfa Antibiotics   . Sulfur Itching and Swelling   Social History   Socioeconomic History  . Marital status: Divorced    Spouse name: Not on file  . Number of children: Not on file  . Years of education: Not on file  . Highest education level: Not on file  Occupational History  . Not on file  Tobacco Use  . Smoking status: Former Smoker    Quit date: 09/22/2012    Years since quitting: 7.3  . Smokeless tobacco: Never Used  Substance and Sexual Activity  . Alcohol use: Yes    Alcohol/week: 1.0 standard drinks    Types: 1 Glasses of wine per week    Comment: red wine 1-2 x month  . Drug use: No  . Sexual activity:  Not Currently  Other Topics Concern  . Not on file  Social History Narrative   Lives with daughter Fable Huisman)    Social Determinants of Health   Financial Resource Strain:   . Difficulty of Paying Living Expenses:   Food Insecurity:   . Worried About Charity fundraiser in the Last Year:   . Arboriculturist in the Last Year:   Transportation Needs:   . Film/video editor (Medical):   Marland Kitchen Lack of Transportation (Non-Medical):   Physical Activity:   . Days of Exercise per Week:   .  Minutes of Exercise per Session:   Stress:   . Feeling of Stress :   Social Connections:   . Frequency of Communication with Friends and Family:   . Frequency of Social Gatherings with Friends and Family:   . Attends Religious Services:   . Active Member of Clubs or Organizations:   . Attends Archivist Meetings:   Marland Kitchen Marital Status:     Family History  Problem Relation Age of Onset  . Cancer Maternal Grandfather   . Kidney failure Mother   . Hypertension Mother   . Heart disease Father   . Breast cancer Neg Hx    Social History   Socioeconomic History  . Marital status: Divorced    Spouse name: Not on file  . Number of children: Not on file  . Years of education: Not on file  . Highest education level: Not on file  Occupational History  . Not on file  Tobacco Use  . Smoking status: Former Smoker    Quit date: 09/22/2012    Years since quitting: 7.3  . Smokeless tobacco: Never Used  Substance and Sexual Activity  . Alcohol use: Yes    Alcohol/week: 1.0 standard drinks    Types: 1 Glasses of wine per week    Comment: red wine 1-2 x month  . Drug use: No  . Sexual activity: Not Currently  Other Topics Concern  . Not on file  Social History Narrative   Lives with daughter Aiesha Leland)    Social Determinants of Health   Financial Resource Strain:   . Difficulty of Paying Living Expenses:   Food Insecurity:   . Worried About Charity fundraiser in the Last  Year:   . Arboriculturist in the Last Year:   Transportation Needs:   . Film/video editor (Medical):   Marland Kitchen Lack of Transportation (Non-Medical):   Physical Activity:   . Days of Exercise per Week:   . Minutes of Exercise per Session:   Stress:   . Feeling of Stress :   Social Connections:   . Frequency of Communication with Friends and Family:   . Frequency of Social Gatherings with Friends and Family:   . Attends Religious Services:   . Active Member of Clubs or Organizations:   . Attends Archivist Meetings:   Marland Kitchen Marital Status:      Objective:  General: Alert and oriented x3 in no acute distress  Dermatology: No open lesions bilateral lower extremities, no webspace macerations, no ecchymosis bilateral, all nails x 9 elongated, there is callus skin to nail bed at left 1st toe and early nail regrowth at proximal nail fold on left.  Vascular: Dorsalis Pedis and Posterior Tibial pedal pulses palpable 1/4, Capillary Fill Time 3 seconds,(+) pedal hair growth bilateral, no edema bilateral lower extremities, Temperature gradient within normal limits.  Neurology: Johney Maine sensation intact via light touch bilateral.   Musculoskeletal: Mild tenderness with palpation at left 1st toe that increases at medial>lateral nail fold even though there is no nail regrowth at these areas yet just mild callus skin there.   Xrays  Left Foot   Impression: At the left 1st toe at area of concern there is exostosis/bone spur at medial aspect  Assessment and Plan: Problem List Items Addressed This Visit    None    Visit Diagnoses    Chronic toe pain, left foot    -  Primary   Relevant Orders  DG Foot Complete Left   Nail dystrophy       Exostosis of toe       Ingrown nail          -Complete examination performed -Xrays reviewed -Discussed treatment options for exostosis and chronic toe pain and chronic nail problem -At no charge debrided all nails using sterile nail nipper without  incident -Patient opt for surgical management. Consent obtained for left 1st toe bone spur removal and matriexectomy with phenol at 1st toenail fragments on left. Pre and Post op course explained. Risks, benefits, alternatives explained. No guarantees given or implied. Surgical booking slip submitted and provided patient with Surgical packet and info for [facility] to dispense surgical shoe to use post op -Patient to return to office after surgery or sooner if condition worsens.  Landis Martins, DPM

## 2020-02-13 ENCOUNTER — Other Ambulatory Visit: Payer: Self-pay | Admitting: Sports Medicine

## 2020-02-13 DIAGNOSIS — M898X7 Other specified disorders of bone, ankle and foot: Secondary | ICD-10-CM

## 2020-02-17 ENCOUNTER — Telehealth: Payer: Self-pay

## 2020-02-17 NOTE — Telephone Encounter (Signed)
DOS 03/02/2020  EXOSTECTOMY 1ST LT - 28108 EXC NAIL PERM 1ST LT - 11750  [employer] MEDICARE EFFECTIVE DATE - 09/27/2019  PLAN DEDUCTIBLE - $0.00 OUT OF POCKET - $7550.00 W/ $7550.00 REMAINING  CO-INSURANCE 20% / Day OUTPATIENT SURGERY 20% / Day OUTPATIENT HOSPITAL COPAY $0 / Day OUTPATIENT SURGERY $0 / Day OUTPATIENT HOSPITAL  Notification or Prior Authorization is not required for the requested services  This [employer] Medicare Advantage members plan does not currently require a prior authorization for these services. If you have general questions about the prior authorization requirements, please call us at [phone] or visit [email or web address] > Clinician Resources > Advance and Admission Notification Requirements. The number above acknowledges your notification. Please write this number down for future reference. Notification is not a guarantee of coverage or payment.  Decision ID #:[id number]

## 2020-02-27 ENCOUNTER — Ambulatory Visit (INDEPENDENT_AMBULATORY_CARE_PROVIDER_SITE_OTHER): Payer: Medicare Other | Admitting: Family Medicine

## 2020-02-27 ENCOUNTER — Other Ambulatory Visit: Payer: Self-pay

## 2020-02-27 ENCOUNTER — Encounter: Payer: Self-pay | Admitting: Family Medicine

## 2020-02-27 VITALS — BP 118/80 | HR 62 | Temp 97.8°F | Ht 68.0 in | Wt 191.5 lb

## 2020-02-27 DIAGNOSIS — R42 Dizziness and giddiness: Secondary | ICD-10-CM

## 2020-02-27 DIAGNOSIS — J45909 Unspecified asthma, uncomplicated: Secondary | ICD-10-CM | POA: Diagnosis not present

## 2020-02-27 DIAGNOSIS — I10 Essential (primary) hypertension: Secondary | ICD-10-CM

## 2020-02-27 DIAGNOSIS — E1159 Type 2 diabetes mellitus with other circulatory complications: Secondary | ICD-10-CM | POA: Diagnosis not present

## 2020-02-27 DIAGNOSIS — E1149 Type 2 diabetes mellitus with other diabetic neurological complication: Secondary | ICD-10-CM

## 2020-02-27 DIAGNOSIS — I152 Hypertension secondary to endocrine disorders: Secondary | ICD-10-CM

## 2020-02-27 DIAGNOSIS — F0781 Postconcussional syndrome: Secondary | ICD-10-CM | POA: Insufficient documentation

## 2020-02-27 LAB — POCT GLYCOSYLATED HEMOGLOBIN (HGB A1C): Hemoglobin A1C: 6 % — AB (ref 4.0–5.6)

## 2020-02-27 MED ORDER — MONTELUKAST SODIUM 10 MG PO TABS: 10.0000 mg | ORAL_TABLET | Freq: Every day | ORAL | 3 refills | Status: DC

## 2020-02-27 NOTE — Progress Notes (Signed)
   Julie Gilbert is a 69 y.o. female who presents today for an office visit.  Assessment/Plan:  Chronic Problems Addressed Today: Type 2 diabetes mellitus with neurological complications (HCC) A1c stable at 6.0.  Congratulated patient on her dietary changes.  We will continue Januvia 50 mg daily.  She will follow-up in 6 months and we will recheck A1c at that time.  Hypertension associated with diabetes (HCC) At goal.  Continue Norvasc 5 mg daily and Imdur 30 mg daily.  Vertigo Overall stable.  Had one fall since her last visit.  Does not want to do physical therapy at this point.  Asthma She has had some worsening cough and some postnasal drip as well.  We will add Singulair 10 mg daily.  Continue current dose of Symbicort.      Subjective:  HPI:  See A/p.        Objective:  Physical Exam: BP 118/80 (BP Location: Left Arm, Patient Position: Sitting, Cuff Size: Large)   Pulse 62   Temp 97.8 F (36.6 C) (Temporal)   Ht 5\' 8"  (1.727 m)   Wt 191 lb 8 oz (86.9 kg)   LMP  (LMP Unknown) Comment: Menopausal  SpO2 97%   BMI 29.12 kg/m   Wt Readings from Last 3 Encounters:  02/27/20 191 lb 8 oz (86.9 kg)  12/09/19 192 lb 6.4 oz (87.3 kg)  11/21/19 188 lb 12.8 oz (85.6 kg)  Gen: No acute distress, resting comfortably CV: Regular rate and rhythm with no murmurs appreciated Pulm: Normal work of breathing, clear to auscultation bilaterally with no crackles, wheezes, or rhonchi Neuro: Grossly normal, moves all extremities Psych: Normal affect and thought content      Yassen Kinnett M. 11/23/19, MD 02/27/2020 10:00 AM

## 2020-02-27 NOTE — Assessment & Plan Note (Signed)
A1c stable at 6.0.  Congratulated patient on her dietary changes.  We will continue Januvia 50 mg daily.  She will follow-up in 6 months and we will recheck A1c at that time.

## 2020-02-27 NOTE — Assessment & Plan Note (Signed)
She has had some worsening cough and some postnasal drip as well.  We will add Singulair 10 mg daily.  Continue current dose of Symbicort.

## 2020-02-27 NOTE — Assessment & Plan Note (Signed)
At goal.  Continue Norvasc 5 mg daily and Imdur 30 mg daily.

## 2020-02-27 NOTE — Assessment & Plan Note (Signed)
Overall stable.  Had one fall since her last visit.  Does not want to do physical therapy at this point.

## 2020-02-27 NOTE — Patient Instructions (Signed)
It was very nice to see you today!  Please start Singulair.  Keep up the good work with your diet and exercise.  I will see you back in 6 months for your annual physical with blood work.  Please come back to see me sooner if needed.  Take care, Dr [name]  Please try these tips to maintain a healthy lifestyle:   Eat at least 3 REAL meals and 1-2 snacks per day.  Aim for no more than 5 hours between eating.  If you eat breakfast, please do so within one hour of getting up.    Each meal should contain half fruits/vegetables, one quarter protein, and one quarter carbs (no bigger than a computer mouse)   Cut down on sweet beverages. This includes juice, soda, and sweet tea.     Drink at least 1 glass of water with each meal and aim for at least 8 glasses per day   Exercise at least 150 minutes every week.

## 2020-03-02 ENCOUNTER — Encounter: Payer: Self-pay | Admitting: Sports Medicine

## 2020-03-02 ENCOUNTER — Other Ambulatory Visit: Payer: Self-pay | Admitting: Sports Medicine

## 2020-03-02 DIAGNOSIS — M25572 Pain in left ankle and joints of left foot: Secondary | ICD-10-CM | POA: Diagnosis not present

## 2020-03-02 DIAGNOSIS — L03039 Cellulitis of unspecified toe: Secondary | ICD-10-CM | POA: Diagnosis not present

## 2020-03-02 DIAGNOSIS — M898X7 Other specified disorders of bone, ankle and foot: Secondary | ICD-10-CM | POA: Diagnosis not present

## 2020-03-02 DIAGNOSIS — L600 Ingrowing nail: Secondary | ICD-10-CM | POA: Diagnosis not present

## 2020-03-02 DIAGNOSIS — I10 Essential (primary) hypertension: Secondary | ICD-10-CM | POA: Diagnosis not present

## 2020-03-02 DIAGNOSIS — Z9889 Other specified postprocedural states: Secondary | ICD-10-CM

## 2020-03-02 MED ORDER — PROMETHAZINE HCL 12.5 MG PO TABS: 12.5000 mg | ORAL_TABLET | Freq: Three times a day (TID) | ORAL | 0 refills | Status: DC | PRN

## 2020-03-02 MED ORDER — DOCUSATE SODIUM 100 MG PO CAPS: 100.0000 mg | ORAL_CAPSULE | Freq: Two times a day (BID) | ORAL | 0 refills | Status: DC

## 2020-03-02 MED ORDER — HYDROCODONE-ACETAMINOPHEN 5-325 MG PO TABS: 1.0000 | ORAL_TABLET | Freq: Four times a day (QID) | ORAL | 0 refills | Status: AC | PRN

## 2020-03-02 MED ORDER — CLINDAMYCIN HCL 300 MG PO CAPS: 300.0000 mg | ORAL_CAPSULE | Freq: Three times a day (TID) | ORAL | 0 refills | Status: AC

## 2020-03-02 NOTE — Progress Notes (Signed)
Post op meds entered 

## 2020-03-03 ENCOUNTER — Encounter: Payer: Self-pay | Admitting: Pharmacist

## 2020-03-03 ENCOUNTER — Telehealth: Payer: Self-pay | Admitting: Sports Medicine

## 2020-03-03 NOTE — Telephone Encounter (Signed)
Postoperative check phone call made to patient.  Patient daughter answered and reports that her mom had a good night with no problems or issues.  I advised daughter to continue with rest ice elevation and mom taking medications as instructed.  I also advised daughter that I do not want them soaking like I told them on yesterday wait until I see mom on next week for an evaluation and then we will determine if she can start soaking her toe based on how things are healing.  Daughter expressed understanding and advised her if there is anything draining through her dressing may redress with dry gauze and Coban wrap and call/notify office. -Dr. Marylene Land

## 2020-03-10 ENCOUNTER — Encounter: Payer: Self-pay | Admitting: Sports Medicine

## 2020-03-10 ENCOUNTER — Ambulatory Visit (INDEPENDENT_AMBULATORY_CARE_PROVIDER_SITE_OTHER): Payer: Medicare Other | Admitting: Sports Medicine

## 2020-03-10 ENCOUNTER — Other Ambulatory Visit: Payer: Self-pay

## 2020-03-10 ENCOUNTER — Ambulatory Visit (INDEPENDENT_AMBULATORY_CARE_PROVIDER_SITE_OTHER): Payer: Medicare Other

## 2020-03-10 VITALS — BP 137/92 | HR 62 | Temp 97.3°F | Resp 16

## 2020-03-10 DIAGNOSIS — M79675 Pain in left toe(s): Secondary | ICD-10-CM

## 2020-03-10 DIAGNOSIS — L603 Nail dystrophy: Secondary | ICD-10-CM

## 2020-03-10 DIAGNOSIS — M898X7 Other specified disorders of bone, ankle and foot: Secondary | ICD-10-CM

## 2020-03-10 DIAGNOSIS — Z9889 Other specified postprocedural states: Secondary | ICD-10-CM

## 2020-03-10 DIAGNOSIS — G8929 Other chronic pain: Secondary | ICD-10-CM

## 2020-03-10 DIAGNOSIS — L600 Ingrowing nail: Secondary | ICD-10-CM

## 2020-03-10 NOTE — Progress Notes (Signed)
Subjective: Julie Gilbert is a 69 y.o. female patient seen today in office for POV #1 (DOS 03-02-20), S/P Excision of nail and exostectomy left hallux. Patient admits to some throbbing pain at surgical site, denies calf pain, denies headache, chest pain, shortness of breath, nausea, vomiting, fever, or chills. No other issues noted.   Patient Active Problem List   Diagnosis Date Noted  . Vertigo 02/27/2020  . B12 deficiency 02/11/2020  . Iron deficiency 02/11/2020  . Epidermoid cyst of skin 01/30/2020  . Inflamed seborrheic keratosis 01/30/2020  . Asthma 06/27/2019  . Chronic pain of both knees 02/15/2019  . Coronary artery disease 10/01/2018  . Dyslipidemia associated with type 2 diabetes mellitus ([place]) 05/18/2018  . Anxiety 05/18/2018  . CKD stage 3 due to type 2 diabetes mellitus ([place]) 05/18/2018  . Chest pain syndrome 10/27/2017  . Herniated intervertebral disc of lumbar spine 05/01/2017  . Hyperlipidemia 04/05/2017  . Osteoarthritis of spine 02/03/2017  . Moderate episode of recurrent major depressive disorder ([place]) 01/04/2017  . Chronic anemia 06/29/2016  . Gastroesophageal reflux disease without esophagitis 06/18/2015  . Hypertension associated with diabetes ([place]) 05/13/2015  . Type 2 diabetes mellitus with neurological complications ([place]) 80/32/1224  . IBS (irritable bowel syndrome) 05/13/2015  . Migraine headache 05/13/2015  . Insomnia 05/13/2015    Current Outpatient Medications on File Prior to Visit  Medication Sig Dispense Refill  . albuterol (VENTOLIN HFA) 108 (90 Base) MCG/ACT inhaler Inhale 2 puffs into the lungs every 6 (six) hours as needed for wheezing or shortness of breath. 8 g 6  . amLODipine (NORVASC) 5 MG tablet Take 1 tablet (5 mg total) by mouth daily. 90 tablet 3  . Ascorbic Acid (VITAMIN C) 1000 MG tablet Take 1,000 mg by mouth daily.    Marland Kitchen aspirin 81 MG tablet Take 81 mg by mouth daily.    Marland Kitchen atorvastatin (LIPITOR) 80 MG tablet Take 80 mg by mouth daily.     . Blood Glucose Monitoring Suppl (ACCU-CHEK AVIVA PLUS) w/Device KIT Use as directed to check blood sugar once daily. E11.9 1 kit 0  . Blood Pressure Monitoring (BLOOD PRESSURE KIT) DEVI 1 application by Does not apply route daily. Use to check blood pressure daily 1 Device 0  . budesonide-formoterol (SYMBICORT) 160-4.5 MCG/ACT inhaler Inhale 2 puffs into the lungs 2 (two) times daily. 1 Inhaler 3  . Cholecalciferol 1000 units tablet Take 1,000 Units by mouth daily.    . clonazePAM (KLONOPIN) 1 MG tablet Take 1 tablet (1 mg total) by mouth 3 (three) times daily. 270 tablet 1  . COD LIVER OIL PO Take 500 Units by mouth daily.    Marland Kitchen DEXILANT 60 MG capsule TAKE 1 CAPSULE BY MOUTH EVERY DAY 90 capsule 4  . docusate sodium (COLACE) 100 MG capsule Take 1 capsule (100 mg total) by mouth 2 (two) times daily. 10 capsule 0  . EMGALITY 120 MG/ML SOAJ Inject as directed See admin instructions.    Marland Kitchen escitalopram (LEXAPRO) 10 MG tablet TAKE 1 TABLET BY MOUTH EVERY DAY 90 tablet 1  . Fish Oil-Cholecalciferol (FISH OIL + D3) 1000-1000 MG-UNIT CAPS Take 1 tablet by mouth daily.    Marland Kitchen glucose blood (ACCU-CHEK AVIVA PLUS) test strip Use as instructed to check blood sugar once daily. E11.9 100 each 12  . Lancets (ACCU-CHEK SOFT TOUCH) lancets Use as instructed to check blood sugar once daily E11.9 100 each 12  . losartan (COZAAR) 25 MG tablet Take 1 tablet (25 mg total)  by mouth daily. 90 tablet 3  . MAGNESIUM PO Take 840 mg by mouth daily. 1 1/2 daily     . meclizine (ANTIVERT) 25 MG tablet TAKE 1 TABLET (25 MG) BY ORAL ROUTE 3 TIMES PER DAY FOR 30 90 tablet 1  . montelukast (SINGULAIR) 10 MG tablet Take 1 tablet (10 mg total) by mouth at bedtime. 30 tablet 3  . Multiple Vitamin (MULTIVITAMIN WITH MINERALS) TABS tablet Take 1 tablet by mouth daily.    Marland Kitchen neomycin-polymyxin-hydrocortisone (CORTISPORIN) OTIC solution Apply 1-2 drops to the toe after soaking toe twice a day 10 mL 0  . nitroGLYCERIN (NITROSTAT) 0.4 MG  SL tablet PLACE 1 TABLET (0.4 MG TOTAL) UNDER THE TONGUE EVERY 5 (FIVE) MINUTES AS NEEDED FOR CHEST PAIN. 90 tablet 2  . promethazine (PHENERGAN) 12.5 MG tablet Take 1 tablet (12.5 mg total) by mouth every 8 (eight) hours as needed for nausea or vomiting. 20 tablet 0  . Riboflavin (VITAMIN B-2 PO) Take 250 mg by mouth daily.    Marland Kitchen topiramate (TOPAMAX) 50 MG tablet Take 1 tablet (50 mg total) by mouth 2 (two) times daily. (Patient taking differently: Take 150 mg by mouth 2 (two) times daily. ) 180 tablet 1   No current facility-administered medications on file prior to visit.    Allergies  Allergen Reactions  . Peanuts [Peanut Oil] Anaphylaxis  . Shrimp [Shellfish Allergy] Anaphylaxis  . Latex Hives  . Penicillins Hives    Has patient had a PCN reaction causing immediate rash, facial/tongue/throat swelling, SOB or lightheadedness with hypotension: No Has patient had a PCN reaction causing severe rash involving mucus membranes or skin necrosis: No Has patient had a PCN reaction that required hospitalization: No Has patient had a PCN reaction occurring within the last 10 years: No  If all of the above answers are "NO", then may proceed with Cephalosporin use.    Marland Kitchen Pineapple     Scratchy throat, Tongue swelling  . Sulfa Antibiotics   . Sulfur Itching and Swelling    Objective: There were no vitals filed for this visit.  General: No acute distress, AAOx3  Left foot: Sutures intact with no gapping or dehiscence at surgical site, nail bed granular with tissue in nail bed, mild maceration, mild swelling to left no erythema, no warmth, no drainage, no signs of infection noted, Capillary fill time <3 seconds in all digits, gross sensation present via light touch to left foot. No pain or crepitation with range of motion left foot.  No pain with calf compression.   Post Op Xray,Left foot: S/p exostectomy hallux. Soft tissue swelling within normal limits for post op status.   Assessment and  Plan:  Problem List Items Addressed This Visit    None    Visit Diagnoses    Exostosis of toe    -  Primary   Relevant Orders   DG Foot Complete Left   Nail dystrophy       Chronic toe pain, left foot       Ingrown nail       S/P foot surgery, left          -Patient seen and evaluated -Xrays reviewed -Applied dry sterile dressing to surgical site left foot secured with ACE wrap and stockinet  -Advised patient may change dressing daily using antibiotic cream and guaze dressing as instructed  -Advised patient to continue with post-op shoe on left foot   -Advised patient to limit activity to necessity  -Advised patient  to ice and elevate as necessary  -Will plan for suture removal at next office visit. In the meantime, patient to call office if any issues or problems arise.   Landis Martins, DPM

## 2020-03-13 ENCOUNTER — Telehealth: Payer: Self-pay | Admitting: Family Medicine

## 2020-03-13 NOTE — Telephone Encounter (Signed)
Referral placed.

## 2020-03-13 NOTE — Telephone Encounter (Signed)
Patient called in and stated that she had called in to ask about her daughter referral patient stated she was on the phone and then was hungup on but blair stated the phone dropped. Patient wanted me to send a message that she would like to speak to Dr. [name] about a few things.

## 2020-03-13 NOTE — Telephone Encounter (Signed)
error 

## 2020-03-17 ENCOUNTER — Ambulatory Visit (INDEPENDENT_AMBULATORY_CARE_PROVIDER_SITE_OTHER): Payer: Medicare Other | Admitting: Sports Medicine

## 2020-03-17 ENCOUNTER — Other Ambulatory Visit: Payer: Self-pay

## 2020-03-17 ENCOUNTER — Other Ambulatory Visit: Payer: Self-pay | Admitting: Family Medicine

## 2020-03-17 ENCOUNTER — Encounter: Payer: Self-pay | Admitting: Sports Medicine

## 2020-03-17 DIAGNOSIS — G8929 Other chronic pain: Secondary | ICD-10-CM

## 2020-03-17 DIAGNOSIS — M898X7 Other specified disorders of bone, ankle and foot: Secondary | ICD-10-CM

## 2020-03-17 DIAGNOSIS — H8110 Benign paroxysmal vertigo, unspecified ear: Secondary | ICD-10-CM

## 2020-03-17 DIAGNOSIS — L600 Ingrowing nail: Secondary | ICD-10-CM

## 2020-03-17 DIAGNOSIS — Z9889 Other specified postprocedural states: Secondary | ICD-10-CM

## 2020-03-17 DIAGNOSIS — M79675 Pain in left toe(s): Secondary | ICD-10-CM

## 2020-03-17 DIAGNOSIS — L603 Nail dystrophy: Secondary | ICD-10-CM

## 2020-03-17 NOTE — Progress Notes (Signed)
Subjective: Julie Gilbert is a 69 y.o. female patient seen today in office for POV #2 (DOS 03-02-20), S/P Excision of nail and exostectomy left hallux. Patient admits that pain is better, nervous about sutures, denies calf pain, denies headache, chest pain, shortness of breath, nausea, vomiting, fever, or chills. No other issues noted.   Patient Active Problem List   Diagnosis Date Noted   Vertigo 02/27/2020   B12 deficiency 02/11/2020   Iron deficiency 02/11/2020   Epidermoid cyst of skin 01/30/2020   Inflamed seborrheic keratosis 01/30/2020   Asthma 06/27/2019   Chronic pain of both knees 02/15/2019   Coronary artery disease 10/01/2018   Dyslipidemia associated with type 2 diabetes mellitus ([place]) 05/18/2018   Anxiety 05/18/2018   CKD stage 3 due to type 2 diabetes mellitus ([place]) 05/18/2018   Chest pain syndrome 10/27/2017   Herniated intervertebral disc of lumbar spine 05/01/2017   Hyperlipidemia 04/05/2017   Osteoarthritis of spine 02/03/2017   Moderate episode of recurrent major depressive disorder ([place]) 01/04/2017   Chronic anemia 06/29/2016   Gastroesophageal reflux disease without esophagitis 06/18/2015   Hypertension associated with diabetes ([place]) 05/13/2015   Type 2 diabetes mellitus with neurological complications ([place]) 56/81/2751   IBS (irritable bowel syndrome) 05/13/2015   Migraine headache 05/13/2015   Insomnia 05/13/2015    Current Outpatient Medications on File Prior to Visit  Medication Sig Dispense Refill   albuterol (VENTOLIN HFA) 108 (90 Base) MCG/ACT inhaler Inhale 2 puffs into the lungs every 6 (six) hours as needed for wheezing or shortness of breath. 8 g 6   amLODipine (NORVASC) 5 MG tablet Take 1 tablet (5 mg total) by mouth daily. 90 tablet 3   Ascorbic Acid (VITAMIN C) 1000 MG tablet Take 1,000 mg by mouth daily.     aspirin 81 MG tablet Take 81 mg by mouth daily.     atorvastatin (LIPITOR) 80 MG tablet Take 80 mg by mouth  daily.     Blood Glucose Monitoring Suppl (ACCU-CHEK AVIVA PLUS) w/Device KIT Use as directed to check blood sugar once daily. E11.9 1 kit 0   Blood Pressure Monitoring (BLOOD PRESSURE KIT) DEVI 1 application by Does not apply route daily. Use to check blood pressure daily 1 Device 0   budesonide-formoterol (SYMBICORT) 160-4.5 MCG/ACT inhaler Inhale 2 puffs into the lungs 2 (two) times daily. 1 Inhaler 3   Cholecalciferol 1000 units tablet Take 1,000 Units by mouth daily.     clonazePAM (KLONOPIN) 1 MG tablet Take 1 tablet (1 mg total) by mouth 3 (three) times daily. 270 tablet 1   COD LIVER OIL PO Take 500 Units by mouth daily.     DEXILANT 60 MG capsule TAKE 1 CAPSULE BY MOUTH EVERY DAY 90 capsule 4   docusate sodium (COLACE) 100 MG capsule Take 1 capsule (100 mg total) by mouth 2 (two) times daily. 10 capsule 0   EMGALITY 120 MG/ML SOAJ Inject as directed See admin instructions.     escitalopram (LEXAPRO) 10 MG tablet TAKE 1 TABLET BY MOUTH EVERY DAY 90 tablet 1   Fish Oil-Cholecalciferol (FISH OIL + D3) 1000-1000 MG-UNIT CAPS Take 1 tablet by mouth daily.     glucose blood (ACCU-CHEK AVIVA PLUS) test strip Use as instructed to check blood sugar once daily. E11.9 100 each 12   Lancets (ACCU-CHEK SOFT TOUCH) lancets Use as instructed to check blood sugar once daily E11.9 100 each 12   losartan (COZAAR) 25 MG tablet Take 1 tablet (25 mg total)  by mouth daily. 90 tablet 3   MAGNESIUM PO Take 840 mg by mouth daily. 1 1/2 daily      meclizine (ANTIVERT) 25 MG tablet TAKE 1 TABLET (25 MG) BY ORAL ROUTE 3 TIMES PER DAY FOR 30 90 tablet 1   montelukast (SINGULAIR) 10 MG tablet Take 1 tablet (10 mg total) by mouth at bedtime. 30 tablet 3   Multiple Vitamin (MULTIVITAMIN WITH MINERALS) TABS tablet Take 1 tablet by mouth daily.     neomycin-polymyxin-hydrocortisone (CORTISPORIN) OTIC solution Apply 1-2 drops to the toe after soaking toe twice a day 10 mL 0   nitroGLYCERIN (NITROSTAT)  0.4 MG SL tablet PLACE 1 TABLET (0.4 MG TOTAL) UNDER THE TONGUE EVERY 5 (FIVE) MINUTES AS NEEDED FOR CHEST PAIN. 90 tablet 2   promethazine (PHENERGAN) 12.5 MG tablet Take 1 tablet (12.5 mg total) by mouth every 8 (eight) hours as needed for nausea or vomiting. 20 tablet 0   Riboflavin (VITAMIN B-2 PO) Take 250 mg by mouth daily.     topiramate (TOPAMAX) 50 MG tablet Take 1 tablet (50 mg total) by mouth 2 (two) times daily. (Patient taking differently: Take 150 mg by mouth 2 (two) times daily. ) 180 tablet 1   No current facility-administered medications on file prior to visit.    Allergies  Allergen Reactions   Peanuts [Peanut Oil] Anaphylaxis   Shrimp [Shellfish Allergy] Anaphylaxis   Latex Hives   Penicillins Hives    Has patient had a PCN reaction causing immediate rash, facial/tongue/throat swelling, SOB or lightheadedness with hypotension: No Has patient had a PCN reaction causing severe rash involving mucus membranes or skin necrosis: No Has patient had a PCN reaction that required hospitalization: No Has patient had a PCN reaction occurring within the last 10 years: No  If all of the above answers are "NO", then may proceed with Cephalosporin use.     Pineapple     Scratchy throat, Tongue swelling   Sulfa Antibiotics    Sulfur Itching and Swelling    Objective: There were no vitals filed for this visit.  General: No acute distress, AAOx3  Left foot: Sutures intact with no gapping or dehiscence at surgical site, nail bed granular with tissue in nail bed,  Resolved maceration, mild swelling to left no erythema, no warmth, no drainage, no signs of infection noted, Capillary fill time <3 seconds in all digits, gross sensation present via light touch to left foot. No pain or crepitation with range of motion left foot except with guarding to the toe.  No pain with calf compression.   Assessment and Plan:  Problem List Items Addressed This Visit    None    Visit  Diagnoses    Exostosis of toe    -  Primary   Nail dystrophy       Chronic toe pain, left foot       Ingrown nail       S/P foot surgery, left          -Patient seen and evaluated -Sutures removed -Applied dry sterile dressing to surgical site left foot  -Advised patient and daughter to continue with daily dressing changes and antibiotic cream to the area may leave open to air periodically and may try once weekly soaking with warm water and Epson salt however if there is increased pain redness or drainage to discontinue -Advised patient to continue with post-op shoe on left foot   -Advised patient to limit activity to necessity  -  Advised patient to ice and elevate as necessary  -Will plan for toe check at next office visit. In the meantime, patient to call office if any issues or problems arise.   Landis Martins, DPM

## 2020-04-07 ENCOUNTER — Encounter: Payer: Self-pay | Admitting: Sports Medicine

## 2020-04-07 ENCOUNTER — Other Ambulatory Visit: Payer: Self-pay

## 2020-04-07 ENCOUNTER — Ambulatory Visit (INDEPENDENT_AMBULATORY_CARE_PROVIDER_SITE_OTHER): Payer: Medicare Other | Admitting: Sports Medicine

## 2020-04-07 ENCOUNTER — Ambulatory Visit
Admission: RE | Admit: 2020-04-07 | Discharge: 2020-04-07 | Disposition: A | Discharge status: Home / Self Care | Payer: Medicare Other | Source: Ambulatory Visit | Attending: Family Medicine | Admitting: Family Medicine

## 2020-04-07 DIAGNOSIS — Z1231 Encounter for screening mammogram for malignant neoplasm of breast: Secondary | ICD-10-CM | POA: Diagnosis not present

## 2020-04-07 DIAGNOSIS — G8929 Other chronic pain: Secondary | ICD-10-CM

## 2020-04-07 DIAGNOSIS — Z9889 Other specified postprocedural states: Secondary | ICD-10-CM

## 2020-04-07 DIAGNOSIS — M79675 Pain in left toe(s): Secondary | ICD-10-CM

## 2020-04-07 DIAGNOSIS — M898X7 Other specified disorders of bone, ankle and foot: Secondary | ICD-10-CM

## 2020-04-07 DIAGNOSIS — L603 Nail dystrophy: Secondary | ICD-10-CM

## 2020-04-07 NOTE — Progress Notes (Signed)
Subjective: Julie Gilbert is a 69 y.o. female patient seen today in office for POV #3 (DOS 03-02-20), S/P Excision of nail and exostectomy left hallux. Patient admits that she is starting to feel better and that she had 1 episode of fever but otherwise is doing good, denies calf pain, denies headache, chest pain, shortness of breath, nausea, vomiting, or chills. No other issues noted.   Patient is assisted by daughter this visit.   Patient Active Problem List   Diagnosis Date Noted  . Vertigo 02/27/2020  . B12 deficiency 02/11/2020  . Iron deficiency 02/11/2020  . Epidermoid cyst of skin 01/30/2020  . Inflamed seborrheic keratosis 01/30/2020  . Asthma 06/27/2019  . Chronic pain of both knees 02/15/2019  . Coronary artery disease 10/01/2018  . Dyslipidemia associated with type 2 diabetes mellitus ([place]) 05/18/2018  . Anxiety 05/18/2018  . CKD stage 3 due to type 2 diabetes mellitus ([place]) 05/18/2018  . Chest pain syndrome 10/27/2017  . Herniated intervertebral disc of lumbar spine 05/01/2017  . Hyperlipidemia 04/05/2017  . Osteoarthritis of spine 02/03/2017  . Moderate episode of recurrent major depressive disorder ([place]) 01/04/2017  . Chronic anemia 06/29/2016  . Gastroesophageal reflux disease without esophagitis 06/18/2015  . Hypertension associated with diabetes ([place]) 05/13/2015  . Type 2 diabetes mellitus with neurological complications ([place]) 54/00/8676  . IBS (irritable bowel syndrome) 05/13/2015  . Migraine headache 05/13/2015  . Insomnia 05/13/2015    Current Outpatient Medications on File Prior to Visit  Medication Sig Dispense Refill  . albuterol (VENTOLIN HFA) 108 (90 Base) MCG/ACT inhaler Inhale 2 puffs into the lungs every 6 (six) hours as needed for wheezing or shortness of breath. 8 g 6  . amLODipine (NORVASC) 5 MG tablet Take 1 tablet (5 mg total) by mouth daily. 90 tablet 3  . Ascorbic Acid (VITAMIN C) 1000 MG tablet Take 1,000 mg by mouth daily.    Marland Kitchen aspirin 81 MG  tablet Take 81 mg by mouth daily.    Marland Kitchen atorvastatin (LIPITOR) 80 MG tablet Take 80 mg by mouth daily.    . Blood Glucose Monitoring Suppl (ACCU-CHEK AVIVA PLUS) w/Device KIT Use as directed to check blood sugar once daily. E11.9 1 kit 0  . Blood Pressure Monitoring (BLOOD PRESSURE KIT) DEVI 1 application by Does not apply route daily. Use to check blood pressure daily 1 Device 0  . budesonide-formoterol (SYMBICORT) 160-4.5 MCG/ACT inhaler Inhale 2 puffs into the lungs 2 (two) times daily. 1 Inhaler 3  . Cholecalciferol 1000 units tablet Take 1,000 Units by mouth daily.    . clonazePAM (KLONOPIN) 1 MG tablet Take 1 tablet (1 mg total) by mouth 3 (three) times daily. 270 tablet 1  . COD LIVER OIL PO Take 500 Units by mouth daily.    Marland Kitchen DEXILANT 60 MG capsule TAKE 1 CAPSULE BY MOUTH EVERY DAY 90 capsule 4  . docusate sodium (COLACE) 100 MG capsule Take 1 capsule (100 mg total) by mouth 2 (two) times daily. 10 capsule 0  . EMGALITY 120 MG/ML SOAJ Inject as directed See admin instructions.    Marland Kitchen escitalopram (LEXAPRO) 10 MG tablet TAKE 1 TABLET BY MOUTH EVERY DAY 90 tablet 1  . Fish Oil-Cholecalciferol (FISH OIL + D3) 1000-1000 MG-UNIT CAPS Take 1 tablet by mouth daily.    Marland Kitchen glucose blood (ACCU-CHEK AVIVA PLUS) test strip Use as instructed to check blood sugar once daily. E11.9 100 each 12  . Lancets (ACCU-CHEK SOFT TOUCH) lancets Use as instructed to check  blood sugar once daily E11.9 100 each 12  . losartan (COZAAR) 25 MG tablet Take 1 tablet (25 mg total) by mouth daily. 90 tablet 3  . MAGNESIUM PO Take 840 mg by mouth daily. 1 1/2 daily     . meclizine (ANTIVERT) 25 MG tablet TAKE 1 TABLET (25 MG) BY ORAL ROUTE 3 TIMES PER DAY FOR 30 90 tablet 1  . montelukast (SINGULAIR) 10 MG tablet Take 1 tablet (10 mg total) by mouth at bedtime. 30 tablet 3  . Multiple Vitamin (MULTIVITAMIN WITH MINERALS) TABS tablet Take 1 tablet by mouth daily.    Marland Kitchen neomycin-polymyxin-hydrocortisone (CORTISPORIN) OTIC solution  Apply 1-2 drops to the toe after soaking toe twice a day 10 mL 0  . nitroGLYCERIN (NITROSTAT) 0.4 MG SL tablet PLACE 1 TABLET (0.4 MG TOTAL) UNDER THE TONGUE EVERY 5 (FIVE) MINUTES AS NEEDED FOR CHEST PAIN. 90 tablet 2  . promethazine (PHENERGAN) 12.5 MG tablet Take 1 tablet (12.5 mg total) by mouth every 8 (eight) hours as needed for nausea or vomiting. 20 tablet 0  . Riboflavin (VITAMIN B-2 PO) Take 250 mg by mouth daily.    Marland Kitchen topiramate (TOPAMAX) 50 MG tablet Take 1 tablet (50 mg total) by mouth 2 (two) times daily. (Patient taking differently: Take 150 mg by mouth 2 (two) times daily. ) 180 tablet 1   No current facility-administered medications on file prior to visit.    Allergies  Allergen Reactions  . Peanuts [Peanut Oil] Anaphylaxis  . Shrimp [Shellfish Allergy] Anaphylaxis  . Latex Hives  . Penicillins Hives    Has patient had a PCN reaction causing immediate rash, facial/tongue/throat swelling, SOB or lightheadedness with hypotension: No Has patient had a PCN reaction causing severe rash involving mucus membranes or skin necrosis: No Has patient had a PCN reaction that required hospitalization: No Has patient had a PCN reaction occurring within the last 10 years: No  If all of the above answers are "NO", then may proceed with Cephalosporin use.    Marland Kitchen Pineapple     Scratchy throat, Tongue swelling  . Sulfa Antibiotics   . Sulfur Itching and Swelling    Objective: There were no vitals filed for this visit.  General: No acute distress, AAOx3  Left foot: Surgical site intact with no gapping or dehiscence, nail bed granular with tissue in nail bed, mild swelling to left no erythema, no warmth, no drainage, no signs of infection noted, Capillary fill time <3 seconds in all digits, gross sensation present via light touch to left foot. No pain or crepitation with range of motion left foot except with mild guarding to the toe.  No pain with calf compression.   Assessment and Plan:   Problem List Items Addressed This Visit    None    Visit Diagnoses    Exostosis of toe    -  Primary   Nail dystrophy       Chronic toe pain, left foot       S/P foot surgery, left          -Patient seen and evaluated -Toe check performed  -Advised patient to leave bandaid off for brief moments to allow area to dry and scab; continue with soaking until scab has formed  -Advised patient may use normal shoe as tolerated  -Advised patient to limit activity to necessity  -Advised patient to ice and elevate as necessary  -Will plan for final toe check at next office visit. In the meantime, patient  to call office if any issues or problems arise.   Landis Martins, DPM

## 2020-04-14 ENCOUNTER — Encounter: Payer: Medicare Other | Admitting: Sports Medicine

## 2020-04-17 ENCOUNTER — Other Ambulatory Visit: Payer: Self-pay | Admitting: Sports Medicine

## 2020-04-17 DIAGNOSIS — Z9889 Other specified postprocedural states: Secondary | ICD-10-CM

## 2020-04-30 ENCOUNTER — Encounter: Payer: Self-pay | Admitting: Sports Medicine

## 2020-04-30 ENCOUNTER — Ambulatory Visit (INDEPENDENT_AMBULATORY_CARE_PROVIDER_SITE_OTHER): Payer: Medicare Other

## 2020-04-30 ENCOUNTER — Ambulatory Visit (INDEPENDENT_AMBULATORY_CARE_PROVIDER_SITE_OTHER): Payer: Medicare Other | Admitting: Sports Medicine

## 2020-04-30 ENCOUNTER — Other Ambulatory Visit: Payer: Self-pay

## 2020-04-30 VITALS — Temp 99.3°F

## 2020-04-30 DIAGNOSIS — M79675 Pain in left toe(s): Secondary | ICD-10-CM

## 2020-04-30 DIAGNOSIS — M898X7 Other specified disorders of bone, ankle and foot: Secondary | ICD-10-CM

## 2020-04-30 DIAGNOSIS — G8929 Other chronic pain: Secondary | ICD-10-CM

## 2020-04-30 DIAGNOSIS — L929 Granulomatous disorder of the skin and subcutaneous tissue, unspecified: Secondary | ICD-10-CM

## 2020-04-30 MED ORDER — SILVER NITRATE-POT NITRATE 75-25 % EX MISC: CUTANEOUS | 0 refills | Status: DC

## 2020-04-30 MED ORDER — CLINDAMYCIN HCL 300 MG PO CAPS
300.0000 mg | ORAL_CAPSULE | Freq: Three times a day (TID) | ORAL | 0 refills | Status: DC
Start: 2020-04-30 — End: 2020-07-27

## 2020-04-30 NOTE — Progress Notes (Signed)
Subjective: Julie Gilbert is a 69 y.o. female patient seen today in office for POV #4 (DOS 03-02-20), S/P Excision of nail and exostectomy left hallux. Patient admits that she hit the toe and had growth on top, denies calf pain, denies headache, chest pain, shortness of breath, nausea, vomiting, or chills. No other issues noted.   Patient Active Problem List   Diagnosis Date Noted  . Vertigo 02/27/2020  . B12 deficiency 02/11/2020  . Iron deficiency 02/11/2020  . Epidermoid cyst of skin 01/30/2020  . Inflamed seborrheic keratosis 01/30/2020  . Asthma 06/27/2019  . Chronic pain of both knees 02/15/2019  . Coronary artery disease 10/01/2018  . Dyslipidemia associated with type 2 diabetes mellitus ([place]) 05/18/2018  . Anxiety 05/18/2018  . CKD stage 3 due to type 2 diabetes mellitus ([place]) 05/18/2018  . Chest pain syndrome 10/27/2017  . Herniated intervertebral disc of lumbar spine 05/01/2017  . Hyperlipidemia 04/05/2017  . Osteoarthritis of spine 02/03/2017  . Moderate episode of recurrent major depressive disorder ([place]) 01/04/2017  . Chronic anemia 06/29/2016  . Gastroesophageal reflux disease without esophagitis 06/18/2015  . Hypertension associated with diabetes ([place]) 05/13/2015  . Type 2 diabetes mellitus with neurological complications ([place]) 85/27/7824  . IBS (irritable bowel syndrome) 05/13/2015  . Migraine headache 05/13/2015  . Insomnia 05/13/2015    Current Outpatient Medications on File Prior to Visit  Medication Sig Dispense Refill  . albuterol (VENTOLIN HFA) 108 (90 Base) MCG/ACT inhaler Inhale 2 puffs into the lungs every 6 (six) hours as needed for wheezing or shortness of breath. 8 g 6  . amLODipine (NORVASC) 5 MG tablet Take 1 tablet (5 mg total) by mouth daily. 90 tablet 3  . Ascorbic Acid (VITAMIN C) 1000 MG tablet Take 1,000 mg by mouth daily.    Marland Kitchen aspirin 81 MG tablet Take 81 mg by mouth daily.    Marland Kitchen atorvastatin (LIPITOR) 80 MG tablet Take 80 mg by mouth daily.     Marland Kitchen azelastine (ASTELIN) 0.1 % nasal spray Place 2 sprays into both nostrils 2 (two) times daily.    . Blood Glucose Monitoring Suppl (ACCU-CHEK AVIVA PLUS) w/Device KIT Use as directed to check blood sugar once daily. E11.9 1 kit 0  . Blood Pressure Monitoring (BLOOD PRESSURE KIT) DEVI 1 application by Does not apply route daily. Use to check blood pressure daily 1 Device 0  . budesonide-formoterol (SYMBICORT) 160-4.5 MCG/ACT inhaler Inhale 2 puffs into the lungs 2 (two) times daily. 1 Inhaler 3  . Cholecalciferol 1000 units tablet Take 1,000 Units by mouth daily.    . clonazePAM (KLONOPIN) 1 MG tablet Take 1 tablet (1 mg total) by mouth 3 (three) times daily. 270 tablet 1  . COD LIVER OIL PO Take 500 Units by mouth daily.    Marland Kitchen DEXILANT 60 MG capsule TAKE 1 CAPSULE BY MOUTH EVERY DAY 90 capsule 4  . docusate sodium (COLACE) 100 MG capsule Take 1 capsule (100 mg total) by mouth 2 (two) times daily. 10 capsule 0  . EMGALITY 120 MG/ML SOAJ Inject as directed See admin instructions.    Marland Kitchen escitalopram (LEXAPRO) 10 MG tablet TAKE 1 TABLET BY MOUTH EVERY DAY 90 tablet 1  . Fish Oil-Cholecalciferol (FISH OIL + D3) 1000-1000 MG-UNIT CAPS Take 1 tablet by mouth daily.    Marland Kitchen glucose blood (ACCU-CHEK AVIVA PLUS) test strip Use as instructed to check blood sugar once daily. E11.9 100 each 12  . Lancets (ACCU-CHEK SOFT TOUCH) lancets Use as instructed to  check blood sugar once daily E11.9 100 each 12  . losartan (COZAAR) 25 MG tablet Take 1 tablet (25 mg total) by mouth daily. 90 tablet 3  . MAGNESIUM PO Take 840 mg by mouth daily. 1 1/2 daily     . meclizine (ANTIVERT) 25 MG tablet TAKE 1 TABLET (25 MG) BY ORAL ROUTE 3 TIMES PER DAY FOR 30 90 tablet 1  . montelukast (SINGULAIR) 10 MG tablet Take 1 tablet (10 mg total) by mouth at bedtime. 30 tablet 3  . Multiple Vitamin (MULTIVITAMIN WITH MINERALS) TABS tablet Take 1 tablet by mouth daily.    Marland Kitchen neomycin-polymyxin-hydrocortisone (CORTISPORIN) OTIC solution  Apply 1-2 drops to the toe after soaking toe twice a day 10 mL 0  . nitroGLYCERIN (NITROSTAT) 0.4 MG SL tablet PLACE 1 TABLET (0.4 MG TOTAL) UNDER THE TONGUE EVERY 5 (FIVE) MINUTES AS NEEDED FOR CHEST PAIN. 90 tablet 2  . predniSONE (DELTASONE) 20 MG tablet Take 20 mg by mouth every morning.    . promethazine (PHENERGAN) 12.5 MG tablet Take 1 tablet (12.5 mg total) by mouth every 8 (eight) hours as needed for nausea or vomiting. 20 tablet 0  . Riboflavin (VITAMIN B-2 PO) Take 250 mg by mouth daily.    Marland Kitchen topiramate (TOPAMAX) 50 MG tablet Take 1 tablet (50 mg total) by mouth 2 (two) times daily. (Patient taking differently: Take 150 mg by mouth 2 (two) times daily. ) 180 tablet 1   No current facility-administered medications on file prior to visit.    Allergies  Allergen Reactions  . Peanuts [Peanut Oil] Anaphylaxis  . Shrimp [Shellfish Allergy] Anaphylaxis  . Latex Hives  . Penicillins Hives    Has patient had a PCN reaction causing immediate rash, facial/tongue/throat swelling, SOB or lightheadedness with hypotension: No Has patient had a PCN reaction causing severe rash involving mucus membranes or skin necrosis: No Has patient had a PCN reaction that required hospitalization: No Has patient had a PCN reaction occurring within the last 10 years: No  If all of the above answers are "NO", then may proceed with Cephalosporin use.    Marland Kitchen Pineapple     Scratchy throat, Tongue swelling  . Sulfa Antibiotics   . Sulfur Itching and Swelling    Objective: There were no vitals filed for this visit.  General: No acute distress, AAOx3  Left foot: Hypergranular nail bed, mild swelling to left no erythema, no warmth, no drainage, no signs of infection noted, Capillary fill time <3 seconds in all digits, gross sensation present via light touch to left foot. No pain or crepitation with range of motion left foot except with mild guarding to the toe.  No pain with calf compression.   Xrays negative    Assessment and Plan:  Problem List Items Addressed This Visit    None    Visit Diagnoses    Granuloma, skin    -  Primary   Exostosis of toe       Relevant Orders   DG Foot Complete Left   Chronic toe pain, left foot       Relevant Medications   predniSONE (DELTASONE) 20 MG tablet      -Patient seen and evaluated -Xrays reviewed -Debrided granuloma using sterile tissue nipper and applied silver nitrate and advised patient to do the same 3x per week -Rx Clindamycin   -Advised patient to refrain from bumping her toes again -Return in 2 weeks for toe check. In the meantime, patient to call office  if any issues or problems arise.   Landis Martins, DPM

## 2020-05-01 ENCOUNTER — Telehealth: Payer: Self-pay | Admitting: *Deleted

## 2020-05-01 NOTE — Telephone Encounter (Signed)
Val She can pay the cash price for them at the pharmacy or if she runs out she can stop by the office to get more. -Dr. Kathie Rhodes

## 2020-05-01 NOTE — Telephone Encounter (Signed)
Pt states the sticks are not covered by insurance.

## 2020-05-01 NOTE — Telephone Encounter (Signed)
Left message informing pt that Dr. Marylene Land states she can pay cash price and at [facility] they would be between $25.00 and $40.00.

## 2020-05-14 ENCOUNTER — Other Ambulatory Visit: Payer: Self-pay

## 2020-05-14 ENCOUNTER — Encounter: Payer: Self-pay | Admitting: Sports Medicine

## 2020-05-14 ENCOUNTER — Ambulatory Visit (INDEPENDENT_AMBULATORY_CARE_PROVIDER_SITE_OTHER): Payer: Medicare Other | Admitting: Sports Medicine

## 2020-05-14 DIAGNOSIS — M79675 Pain in left toe(s): Secondary | ICD-10-CM

## 2020-05-14 DIAGNOSIS — M898X7 Other specified disorders of bone, ankle and foot: Secondary | ICD-10-CM

## 2020-05-14 DIAGNOSIS — Z9889 Other specified postprocedural states: Secondary | ICD-10-CM

## 2020-05-14 DIAGNOSIS — L929 Granulomatous disorder of the skin and subcutaneous tissue, unspecified: Secondary | ICD-10-CM

## 2020-05-14 DIAGNOSIS — G8929 Other chronic pain: Secondary | ICD-10-CM

## 2020-05-14 NOTE — Progress Notes (Signed)
Subjective: Julie Gilbert is a 69 y.o. female patient seen today in office for POV # 5 (DOS 03-02-20), S/P Excision of nail and exostectomy left hallux. Patient admits that pain is doing better, denies calf pain, denies headache, chest pain, shortness of breath, nausea, vomiting, or chills. No other issues noted.   Patient Active Problem List   Diagnosis Date Noted  . Vertigo 02/27/2020  . B12 deficiency 02/11/2020  . Iron deficiency 02/11/2020  . Epidermoid cyst of skin 01/30/2020  . Inflamed seborrheic keratosis 01/30/2020  . Asthma 06/27/2019  . Chronic pain of both knees 02/15/2019  . Coronary artery disease 10/01/2018  . Dyslipidemia associated with type 2 diabetes mellitus ([place]) 05/18/2018  . Anxiety 05/18/2018  . CKD stage 3 due to type 2 diabetes mellitus ([place]) 05/18/2018  . Chest pain syndrome 10/27/2017  . Herniated intervertebral disc of lumbar spine 05/01/2017  . Hyperlipidemia 04/05/2017  . Osteoarthritis of spine 02/03/2017  . Moderate episode of recurrent major depressive disorder ([place]) 01/04/2017  . Chronic anemia 06/29/2016  . Gastroesophageal reflux disease without esophagitis 06/18/2015  . Hypertension associated with diabetes ([place]) 05/13/2015  . Type 2 diabetes mellitus with neurological complications ([place]) 10/04/3233  . IBS (irritable bowel syndrome) 05/13/2015  . Migraine headache 05/13/2015  . Insomnia 05/13/2015    Current Outpatient Medications on File Prior to Visit  Medication Sig Dispense Refill  . albuterol (VENTOLIN HFA) 108 (90 Base) MCG/ACT inhaler Inhale 2 puffs into the lungs every 6 (six) hours as needed for wheezing or shortness of breath. 8 g 6  . amLODipine (NORVASC) 5 MG tablet Take 1 tablet (5 mg total) by mouth daily. 90 tablet 3  . Ascorbic Acid (VITAMIN C) 1000 MG tablet Take 1,000 mg by mouth daily.    Marland Kitchen aspirin 81 MG tablet Take 81 mg by mouth daily.    Marland Kitchen atorvastatin (LIPITOR) 80 MG tablet Take 80 mg by mouth daily.    Marland Kitchen azelastine  (ASTELIN) 0.1 % nasal spray Place 2 sprays into both nostrils 2 (two) times daily.    . Blood Glucose Monitoring Suppl (ACCU-CHEK AVIVA PLUS) w/Device KIT Use as directed to check blood sugar once daily. E11.9 1 kit 0  . Blood Pressure Monitoring (BLOOD PRESSURE KIT) DEVI 1 application by Does not apply route daily. Use to check blood pressure daily 1 Device 0  . budesonide-formoterol (SYMBICORT) 160-4.5 MCG/ACT inhaler Inhale 2 puffs into the lungs 2 (two) times daily. 1 Inhaler 3  . Cholecalciferol 1000 units tablet Take 1,000 Units by mouth daily.    . clindamycin (CLEOCIN) 300 MG capsule Take 1 capsule (300 mg total) by mouth 3 (three) times daily. 30 capsule 0  . clonazePAM (KLONOPIN) 1 MG tablet Take 1 tablet (1 mg total) by mouth 3 (three) times daily. 270 tablet 1  . COD LIVER OIL PO Take 500 Units by mouth daily.    Marland Kitchen DEXILANT 60 MG capsule TAKE 1 CAPSULE BY MOUTH EVERY DAY 90 capsule 4  . docusate sodium (COLACE) 100 MG capsule Take 1 capsule (100 mg total) by mouth 2 (two) times daily. 10 capsule 0  . EMGALITY 120 MG/ML SOAJ Inject as directed See admin instructions.    Marland Kitchen escitalopram (LEXAPRO) 10 MG tablet TAKE 1 TABLET BY MOUTH EVERY DAY 90 tablet 1  . Fish Oil-Cholecalciferol (FISH OIL + D3) 1000-1000 MG-UNIT CAPS Take 1 tablet by mouth daily.    Marland Kitchen glucose blood (ACCU-CHEK AVIVA PLUS) test strip Use as instructed to check blood  sugar once daily. E11.9 100 each 12  . Lancets (ACCU-CHEK SOFT TOUCH) lancets Use as instructed to check blood sugar once daily E11.9 100 each 12  . losartan (COZAAR) 25 MG tablet Take 1 tablet (25 mg total) by mouth daily. 90 tablet 3  . MAGNESIUM PO Take 840 mg by mouth daily. 1 1/2 daily     . meclizine (ANTIVERT) 25 MG tablet TAKE 1 TABLET (25 MG) BY ORAL ROUTE 3 TIMES PER DAY FOR 30 90 tablet 1  . montelukast (SINGULAIR) 10 MG tablet Take 1 tablet (10 mg total) by mouth at bedtime. 30 tablet 3  . Multiple Vitamin (MULTIVITAMIN WITH MINERALS) TABS tablet  Take 1 tablet by mouth daily.    Marland Kitchen neomycin-polymyxin-hydrocortisone (CORTISPORIN) OTIC solution Apply 1-2 drops to the toe after soaking toe twice a day 10 mL 0  . nitroGLYCERIN (NITROSTAT) 0.4 MG SL tablet PLACE 1 TABLET (0.4 MG TOTAL) UNDER THE TONGUE EVERY 5 (FIVE) MINUTES AS NEEDED FOR CHEST PAIN. 90 tablet 2  . predniSONE (DELTASONE) 20 MG tablet Take 20 mg by mouth every morning.    . promethazine (PHENERGAN) 12.5 MG tablet Take 1 tablet (12.5 mg total) by mouth every 8 (eight) hours as needed for nausea or vomiting. 20 tablet 0  . Riboflavin (VITAMIN B-2 PO) Take 250 mg by mouth daily.    . silver nitrate applicators 34-19 % applicator Apply topically 3 (three) times a week. 100 each 0  . topiramate (TOPAMAX) 50 MG tablet Take 1 tablet (50 mg total) by mouth 2 (two) times daily. (Patient taking differently: Take 150 mg by mouth 2 (two) times daily. ) 180 tablet 1   No current facility-administered medications on file prior to visit.    Allergies  Allergen Reactions  . Peanuts [Peanut Oil] Anaphylaxis  . Shrimp [Shellfish Allergy] Anaphylaxis  . Latex Hives  . Penicillins Hives    Has patient had a PCN reaction causing immediate rash, facial/tongue/throat swelling, SOB or lightheadedness with hypotension: No Has patient had a PCN reaction causing severe rash involving mucus membranes or skin necrosis: No Has patient had a PCN reaction that required hospitalization: No Has patient had a PCN reaction occurring within the last 10 years: No  If all of the above answers are "NO", then may proceed with Cephalosporin use.    Marland Kitchen Pineapple     Scratchy throat, Tongue swelling  . Sulfa Antibiotics   . Sulfur Itching and Swelling    Objective: There were no vitals filed for this visit.  General: No acute distress, AAOx3  Left foot: Hypergranular nail bed, slowly improving as compared to last visit, mild swelling to left no erythema, no warmth, no drainage, no signs of infection noted,  Capillary fill time <3 seconds in all digits, gross sensation present via light touch to left foot. No pain or crepitation with range of motion left foot except with mild guarding to the toe like previous.  No pain with calf compression.   Assessment and Plan:  Problem List Items Addressed This Visit    None    Visit Diagnoses    Granuloma, skin    -  Primary   Exostosis of toe       Chronic toe pain, left foot       S/P foot surgery, left          -Patient seen and evaluated -Using a dermal curette debrided granuloma using sterile tissue nipper and applied silver nitrate and advised patient to do  the same every 4 days per week -Continue with clindamycin until completed -Continue with surgical shoe to prevent rubbing or bumping the toe again -Advised patient to return to office in 2 weeks for follow-up evaluation  Landis Martins, DPM

## 2020-05-21 ENCOUNTER — Other Ambulatory Visit: Payer: Self-pay | Admitting: Family Medicine

## 2020-05-24 ENCOUNTER — Other Ambulatory Visit: Payer: Self-pay | Admitting: Family Medicine

## 2020-05-24 DIAGNOSIS — H8110 Benign paroxysmal vertigo, unspecified ear: Secondary | ICD-10-CM

## 2020-05-28 ENCOUNTER — Encounter: Payer: Medicare Other | Admitting: Sports Medicine

## 2020-06-03 ENCOUNTER — Other Ambulatory Visit: Payer: Self-pay | Admitting: Family Medicine

## 2020-06-04 ENCOUNTER — Encounter: Payer: Self-pay | Admitting: Sports Medicine

## 2020-06-04 ENCOUNTER — Other Ambulatory Visit: Payer: Self-pay

## 2020-06-04 ENCOUNTER — Ambulatory Visit (INDEPENDENT_AMBULATORY_CARE_PROVIDER_SITE_OTHER): Payer: Medicare Other | Admitting: Sports Medicine

## 2020-06-04 DIAGNOSIS — Z9889 Other specified postprocedural states: Secondary | ICD-10-CM

## 2020-06-04 DIAGNOSIS — M898X7 Other specified disorders of bone, ankle and foot: Secondary | ICD-10-CM

## 2020-06-04 DIAGNOSIS — M79675 Pain in left toe(s): Secondary | ICD-10-CM

## 2020-06-04 DIAGNOSIS — L929 Granulomatous disorder of the skin and subcutaneous tissue, unspecified: Secondary | ICD-10-CM

## 2020-06-04 DIAGNOSIS — G8929 Other chronic pain: Secondary | ICD-10-CM

## 2020-06-04 NOTE — Progress Notes (Signed)
Subjective: Julie Gilbert is a 69 y.o. female patient seen today in office for POV # 6 (DOS 03-02-20), S/P Excision of nail and exostectomy left hallux. Patient admits that her toe is doing good but otherwise she is not having a good day to personal reasons, "if she told me she would cry", denies calf pain, denies headache, chest pain, shortness of breath, nausea, vomiting, or chills. No other issues noted.   Patient Active Problem List   Diagnosis Date Noted  . Vertigo 02/27/2020  . B12 deficiency 02/11/2020  . Iron deficiency 02/11/2020  . Epidermoid cyst of skin 01/30/2020  . Inflamed seborrheic keratosis 01/30/2020  . Asthma 06/27/2019  . Chronic pain of both knees 02/15/2019  . Coronary artery disease 10/01/2018  . Dyslipidemia associated with type 2 diabetes mellitus ([place]) 05/18/2018  . Anxiety 05/18/2018  . CKD stage 3 due to type 2 diabetes mellitus ([place]) 05/18/2018  . Chest pain syndrome 10/27/2017  . Herniated intervertebral disc of lumbar spine 05/01/2017  . Hyperlipidemia 04/05/2017  . Osteoarthritis of spine 02/03/2017  . Moderate episode of recurrent major depressive disorder ([place]) 01/04/2017  . Chronic anemia 06/29/2016  . Gastroesophageal reflux disease without esophagitis 06/18/2015  . Hypertension associated with diabetes ([place]) 05/13/2015  . Type 2 diabetes mellitus with neurological complications ([place]) 16/06/9603  . IBS (irritable bowel syndrome) 05/13/2015  . Migraine headache 05/13/2015  . Insomnia 05/13/2015    Current Outpatient Medications on File Prior to Visit  Medication Sig Dispense Refill  . albuterol (VENTOLIN HFA) 108 (90 Base) MCG/ACT inhaler Inhale 2 puffs into the lungs every 6 (six) hours as needed for wheezing or shortness of breath. 8 g 6  . amLODipine (NORVASC) 5 MG tablet Take 1 tablet (5 mg total) by mouth daily. 90 tablet 3  . Ascorbic Acid (VITAMIN C) 1000 MG tablet Take 1,000 mg by mouth daily.    Marland Kitchen aspirin 81 MG tablet Take 81 mg by mouth  daily.    Marland Kitchen atorvastatin (LIPITOR) 80 MG tablet Take 80 mg by mouth daily.    Marland Kitchen azelastine (ASTELIN) 0.1 % nasal spray Place 2 sprays into both nostrils 2 (two) times daily.    . Blood Glucose Monitoring Suppl (ACCU-CHEK AVIVA PLUS) w/Device KIT Use as directed to check blood sugar once daily. E11.9 1 kit 0  . Blood Pressure Monitoring (BLOOD PRESSURE KIT) DEVI 1 application by Does not apply route daily. Use to check blood pressure daily 1 Device 0  . budesonide-formoterol (SYMBICORT) 160-4.5 MCG/ACT inhaler Inhale 2 puffs into the lungs 2 (two) times daily. 1 Inhaler 3  . Cholecalciferol 1000 units tablet Take 1,000 Units by mouth daily.    . clindamycin (CLEOCIN) 300 MG capsule Take 1 capsule (300 mg total) by mouth 3 (three) times daily. 30 capsule 0  . clonazePAM (KLONOPIN) 1 MG tablet Take 1 tablet (1 mg total) by mouth 3 (three) times daily. 270 tablet 1  . COD LIVER OIL PO Take 500 Units by mouth daily.    Marland Kitchen DEXILANT 60 MG capsule TAKE 1 CAPSULE BY MOUTH EVERY DAY 90 capsule 4  . docusate sodium (COLACE) 100 MG capsule Take 1 capsule (100 mg total) by mouth 2 (two) times daily. 10 capsule 0  . EMGALITY 120 MG/ML SOAJ Inject as directed See admin instructions.    Marland Kitchen escitalopram (LEXAPRO) 10 MG tablet TAKE 1 TABLET BY MOUTH EVERY DAY 90 tablet 0  . Fish Oil-Cholecalciferol (FISH OIL + D3) 1000-1000 MG-UNIT CAPS Take 1 tablet  by mouth daily.    Marland Kitchen glucose blood (ACCU-CHEK AVIVA PLUS) test strip Use as instructed to check blood sugar once daily. E11.9 100 each 12  . Lancets (ACCU-CHEK SOFT TOUCH) lancets Use as instructed to check blood sugar once daily E11.9 100 each 12  . losartan (COZAAR) 25 MG tablet Take 1 tablet (25 mg total) by mouth daily. 90 tablet 3  . MAGNESIUM PO Take 840 mg by mouth daily. 1 1/2 daily     . meclizine (ANTIVERT) 25 MG tablet TAKE 1 TABLET (25 MG) BY ORAL ROUTE 3 TIMES PER DAY FOR 30 90 tablet 1  . montelukast (SINGULAIR) 10 MG tablet TAKE 1 TABLET BY MOUTH EVERYDAY  AT BEDTIME 90 tablet 2  . Multiple Vitamin (MULTIVITAMIN WITH MINERALS) TABS tablet Take 1 tablet by mouth daily.    Marland Kitchen neomycin-polymyxin-hydrocortisone (CORTISPORIN) OTIC solution Apply 1-2 drops to the toe after soaking toe twice a day 10 mL 0  . nitroGLYCERIN (NITROSTAT) 0.4 MG SL tablet PLACE 1 TABLET (0.4 MG TOTAL) UNDER THE TONGUE EVERY 5 (FIVE) MINUTES AS NEEDED FOR CHEST PAIN. 90 tablet 2  . predniSONE (DELTASONE) 20 MG tablet Take 20 mg by mouth every morning.    . promethazine (PHENERGAN) 12.5 MG tablet Take 1 tablet (12.5 mg total) by mouth every 8 (eight) hours as needed for nausea or vomiting. 20 tablet 0  . Riboflavin (VITAMIN B-2 PO) Take 250 mg by mouth daily.    . silver nitrate applicators 00-92 % applicator Apply topically 3 (three) times a week. 100 each 0  . topiramate (TOPAMAX) 50 MG tablet Take 1 tablet (50 mg total) by mouth 2 (two) times daily. (Patient taking differently: Take 150 mg by mouth 2 (two) times daily. ) 180 tablet 1   No current facility-administered medications on file prior to visit.    Allergies  Allergen Reactions  . Peanuts [Peanut Oil] Anaphylaxis  . Shrimp [Shellfish Allergy] Anaphylaxis  . Latex Hives  . Penicillins Hives    Has patient had a PCN reaction causing immediate rash, facial/tongue/throat swelling, SOB or lightheadedness with hypotension: No Has patient had a PCN reaction causing severe rash involving mucus membranes or skin necrosis: No Has patient had a PCN reaction that required hospitalization: No Has patient had a PCN reaction occurring within the last 10 years: No  If all of the above answers are "NO", then may proceed with Cephalosporin use.    Marland Kitchen Pineapple     Scratchy throat, Tongue swelling  . Sulfa Antibiotics   . Sulfur Itching and Swelling    Objective: There were no vitals filed for this visit.  General: No acute distress, AAOx3  Left foot:Dry scab to nail bed, mild swelling to left 1st toe, no erythema, no  warmth, no drainage, no signs of infection noted, Capillary fill time <3 seconds in all digits, gross sensation present via light touch to left foot. No pain or crepitation with range of motion left foot except with mild guarding to the toe like previous.  No pain with calf compression.   Assessment and Plan:  Problem List Items Addressed This Visit    None    Visit Diagnoses    Granuloma, skin    -  Primary   Exostosis of toe       Chronic toe pain, left foot       S/P foot surgery, left          -Patient seen and evaluated -Dry scab noted at nail  bed  -Advised patient to keep open to air at bedtime and to protect with bandaid during day or when in shoe -Patient may slowly transition to a normal shoe that is open and does not rub -Advised patient to return to office in 4 weeks for follow-up evaluation  Landis Martins, DPM

## 2020-06-17 ENCOUNTER — Other Ambulatory Visit: Payer: Self-pay | Admitting: Family Medicine

## 2020-06-18 ENCOUNTER — Other Ambulatory Visit: Payer: Self-pay | Admitting: Family Medicine

## 2020-07-02 ENCOUNTER — Ambulatory Visit (INDEPENDENT_AMBULATORY_CARE_PROVIDER_SITE_OTHER): Payer: Medicare Other | Admitting: Sports Medicine

## 2020-07-02 ENCOUNTER — Other Ambulatory Visit: Payer: Self-pay

## 2020-07-02 ENCOUNTER — Encounter: Payer: Self-pay | Admitting: Sports Medicine

## 2020-07-02 DIAGNOSIS — Z9889 Other specified postprocedural states: Secondary | ICD-10-CM | POA: Diagnosis not present

## 2020-07-02 DIAGNOSIS — M79675 Pain in left toe(s): Secondary | ICD-10-CM | POA: Diagnosis not present

## 2020-07-02 DIAGNOSIS — G8929 Other chronic pain: Secondary | ICD-10-CM

## 2020-07-02 DIAGNOSIS — M898X7 Other specified disorders of bone, ankle and foot: Secondary | ICD-10-CM | POA: Diagnosis not present

## 2020-07-02 DIAGNOSIS — L929 Granulomatous disorder of the skin and subcutaneous tissue, unspecified: Secondary | ICD-10-CM

## 2020-07-02 NOTE — Progress Notes (Signed)
Subjective: Julie Gilbert is a 69 y.o. female patient seen today in office for POV # 7 (DOS 03-02-20), S/P Excision of nail and exostectomy left hallux. Patient admits that her toe is doing good no issues, chest pain, shortness of breath, nausea, vomiting, or chills. No other issues noted.   Patient Active Problem List   Diagnosis Date Noted  . Vertigo 02/27/2020  . B12 deficiency 02/11/2020  . Iron deficiency 02/11/2020  . Epidermoid cyst of skin 01/30/2020  . Inflamed seborrheic keratosis 01/30/2020  . Asthma 06/27/2019  . Chronic pain of both knees 02/15/2019  . Coronary artery disease 10/01/2018  . Dyslipidemia associated with type 2 diabetes mellitus ([place]) 05/18/2018  . Anxiety 05/18/2018  . CKD stage 3 due to type 2 diabetes mellitus ([place]) 05/18/2018  . Chest pain syndrome 10/27/2017  . Herniated intervertebral disc of lumbar spine 05/01/2017  . Hyperlipidemia 04/05/2017  . Osteoarthritis of spine 02/03/2017  . Moderate episode of recurrent major depressive disorder ([place]) 01/04/2017  . Chronic anemia 06/29/2016  . Gastroesophageal reflux disease without esophagitis 06/18/2015  . Hypertension associated with diabetes ([place]) 05/13/2015  . Type 2 diabetes mellitus with neurological complications ([place]) 53/29/9242  . IBS (irritable bowel syndrome) 05/13/2015  . Migraine headache 05/13/2015  . Insomnia 05/13/2015    Current Outpatient Medications on File Prior to Visit  Medication Sig Dispense Refill  . albuterol (VENTOLIN HFA) 108 (90 Base) MCG/ACT inhaler Inhale 2 puffs into the lungs every 6 (six) hours as needed for wheezing or shortness of breath. 8 g 6  . amLODipine (NORVASC) 5 MG tablet TAKE 1 TABLET BY MOUTH EVERY DAY 90 tablet 3  . Ascorbic Acid (VITAMIN C) 1000 MG tablet Take 1,000 mg by mouth daily.    Marland Kitchen aspirin 81 MG tablet Take 81 mg by mouth daily.    Marland Kitchen atorvastatin (LIPITOR) 80 MG tablet Take 80 mg by mouth daily.    Marland Kitchen azelastine (ASTELIN) 0.1 % nasal spray PLACE 2  SPRAYS INTO BOTH NOSTRILS 2 (TWO) TIMES DAILY. 30 mL 4  . Blood Glucose Monitoring Suppl (ACCU-CHEK AVIVA PLUS) w/Device KIT Use as directed to check blood sugar once daily. E11.9 1 kit 0  . Blood Pressure Monitoring (BLOOD PRESSURE KIT) DEVI 1 application by Does not apply route daily. Use to check blood pressure daily 1 Device 0  . budesonide-formoterol (SYMBICORT) 160-4.5 MCG/ACT inhaler Inhale 2 puffs into the lungs 2 (two) times daily. 1 Inhaler 3  . Cholecalciferol 1000 units tablet Take 1,000 Units by mouth daily.    . clindamycin (CLEOCIN) 300 MG capsule Take 1 capsule (300 mg total) by mouth 3 (three) times daily. 30 capsule 0  . clonazePAM (KLONOPIN) 1 MG tablet Take 1 tablet (1 mg total) by mouth 3 (three) times daily. 270 tablet 1  . COD LIVER OIL PO Take 500 Units by mouth daily.    Marland Kitchen DEXILANT 60 MG capsule TAKE 1 CAPSULE BY MOUTH EVERY DAY 90 capsule 4  . docusate sodium (COLACE) 100 MG capsule Take 1 capsule (100 mg total) by mouth 2 (two) times daily. 10 capsule 0  . EMGALITY 120 MG/ML SOAJ Inject as directed See admin instructions.    Marland Kitchen escitalopram (LEXAPRO) 10 MG tablet TAKE 1 TABLET BY MOUTH EVERY DAY 90 tablet 0  . Fish Oil-Cholecalciferol (FISH OIL + D3) 1000-1000 MG-UNIT CAPS Take 1 tablet by mouth daily.    Marland Kitchen glucose blood (ACCU-CHEK AVIVA PLUS) test strip Use as instructed to check blood sugar once daily.  E11.9 100 each 12  . Lancets (ACCU-CHEK SOFT TOUCH) lancets Use as instructed to check blood sugar once daily E11.9 100 each 12  . losartan (COZAAR) 25 MG tablet Take 1 tablet (25 mg total) by mouth daily. 90 tablet 3  . MAGNESIUM PO Take 840 mg by mouth daily. 1 1/2 daily     . meclizine (ANTIVERT) 25 MG tablet TAKE 1 TABLET (25 MG) BY ORAL ROUTE 3 TIMES PER DAY FOR 30 90 tablet 1  . montelukast (SINGULAIR) 10 MG tablet TAKE 1 TABLET BY MOUTH EVERYDAY AT BEDTIME 90 tablet 2  . Multiple Vitamin (MULTIVITAMIN WITH MINERALS) TABS tablet Take 1 tablet by mouth daily.    Marland Kitchen  neomycin-polymyxin-hydrocortisone (CORTISPORIN) OTIC solution Apply 1-2 drops to the toe after soaking toe twice a day 10 mL 0  . nitroGLYCERIN (NITROSTAT) 0.4 MG SL tablet PLACE 1 TABLET (0.4 MG TOTAL) UNDER THE TONGUE EVERY 5 (FIVE) MINUTES AS NEEDED FOR CHEST PAIN. 90 tablet 2  . predniSONE (DELTASONE) 20 MG tablet Take 20 mg by mouth every morning.    . promethazine (PHENERGAN) 12.5 MG tablet Take 1 tablet (12.5 mg total) by mouth every 8 (eight) hours as needed for nausea or vomiting. 20 tablet 0  . Riboflavin (VITAMIN B-2 PO) Take 250 mg by mouth daily.    . silver nitrate applicators 78-93 % applicator Apply topically 3 (three) times a week. 100 each 0  . topiramate (TOPAMAX) 50 MG tablet Take 1 tablet (50 mg total) by mouth 2 (two) times daily. (Patient taking differently: Take 150 mg by mouth 2 (two) times daily. ) 180 tablet 1   No current facility-administered medications on file prior to visit.    Allergies  Allergen Reactions  . Peanuts [Peanut Oil] Anaphylaxis  . Shrimp [Shellfish Allergy] Anaphylaxis  . Latex Hives  . Penicillins Hives    Has patient had a PCN reaction causing immediate rash, facial/tongue/throat swelling, SOB or lightheadedness with hypotension: No Has patient had a PCN reaction causing severe rash involving mucus membranes or skin necrosis: No Has patient had a PCN reaction that required hospitalization: No Has patient had a PCN reaction occurring within the last 10 years: No  If all of the above answers are "NO", then may proceed with Cephalosporin use.    Marland Kitchen Pineapple     Scratchy throat, Tongue swelling  . Sulfa Antibiotics   . Sulfur Itching and Swelling    Objective: There were no vitals filed for this visit.  General: No acute distress, AAOx3  Left foot: Scab to nail bed, mild swelling to left 1st toe, no erythema, no warmth, no drainage, no signs of infection noted, Capillary fill time <3 seconds in all digits, gross sensation present via  light touch to left foot. No pain or crepitation with range of motion left foot except with mild guarding to the toe like previous.  No pain with calf compression.   Assessment and Plan:  Problem List Items Addressed This Visit    None    Visit Diagnoses    Granuloma, skin    -  Primary   Exostosis of toe       Chronic toe pain, left foot       S/P foot surgery, left          -Patient seen and evaluated -Dry scab noted at nail bed healing well  -Continue with activities to tolerance in a normal shoe -Advised patient to return to office as needed [employer],  DPM

## 2020-07-20 ENCOUNTER — Other Ambulatory Visit: Payer: Self-pay

## 2020-07-20 MED ORDER — CLONAZEPAM 1 MG PO TABS: 1.0000 mg | ORAL_TABLET | Freq: Three times a day (TID) | ORAL | 1 refills | Status: DC

## 2020-07-20 NOTE — Telephone Encounter (Signed)
.   LAST APPOINTMENT DATE: 06/18/2020   NEXT APPOINTMENT DATE:@11 /09/2019  MEDICATION:clonazePAM (KLONOPIN) 1 MG tablet

## 2020-07-20 NOTE — Telephone Encounter (Signed)
Clonazepam last rx 01/01/20 #270 1 RF LOV: 06/18/20 Next appt: 07/27/20

## 2020-07-27 ENCOUNTER — Telehealth (INDEPENDENT_AMBULATORY_CARE_PROVIDER_SITE_OTHER): Payer: Medicare Other | Admitting: Family Medicine

## 2020-07-27 VITALS — Ht 68.0 in | Wt 193.0 lb

## 2020-07-27 DIAGNOSIS — J45909 Unspecified asthma, uncomplicated: Secondary | ICD-10-CM

## 2020-07-27 DIAGNOSIS — E1149 Type 2 diabetes mellitus with other diabetic neurological complication: Secondary | ICD-10-CM | POA: Diagnosis not present

## 2020-07-27 MED ORDER — GUAIFENESIN-CODEINE 100-10 MG/5ML PO SOLN: 5.0000 mL | Freq: Three times a day (TID) | ORAL | 0 refills | Status: DC | PRN

## 2020-07-27 MED ORDER — AZITHROMYCIN 250 MG PO TABS
ORAL_TABLET | ORAL | 0 refills | Status: DC
Start: 2020-07-27 — End: 2020-08-28

## 2020-07-27 NOTE — Assessment & Plan Note (Signed)
Overall stable although cough has been persistent.  We recently started Singulair which has not had much benefit.  She is still on Symbicort as well.  If cough persists would consider referral to pulmonology.

## 2020-07-27 NOTE — Assessment & Plan Note (Addendum)
A1c recently well controlled though will hold off on prednisone for now.

## 2020-07-27 NOTE — Progress Notes (Signed)
   Julie Gilbert is a 69 y.o. female who presents today for a virtual office visit.  Assessment/Plan:  New/Acute Problems: Cough / sinusitis Discussed limitations of virtual visit and inability to perform physical exam.  Given length of symptoms concern for possible sinus infection.  Will start azithromycin.  Also start guaifenesin-codeine for cough.  If not improving in the next several days would consider chest x-ray.  Discussed reasons return to care.  Encourage good oral hydration.  Chronic Problems Addressed Today: Asthma Overall stable although cough has been persistent.  We recently started Singulair which has not had much benefit.  She is still on Symbicort as well.  If cough persists would consider referral to pulmonology.  Type 2 diabetes mellitus with neurological complications (HCC) A1c recently well controlled though will hold off on prednisone for now.       Subjective:  HPI:  Patient with cough for about 2 weeks. Associated with post nasal drip Has been getting a little better.  No chest pain or shortness of breath.  Some sore throat.  She has been taking Aleve occasionally due to pain in her foot related to a granuloma secondary to exostosis excision.  She has been trying natural cough syrup which seems to be helping.       Objective/Observations  Physical Exam: Gen: NAD, resting comfortably Pulm: Normal work of breathing Neuro: Grossly normal, moves all extremities Psych: Normal affect and thought content  Virtual Visit via Video   I connected with Julie Gilbert on 07/27/20 at  3:40 PM EDT by a video enabled telemedicine application and verified that I am speaking with the correct person using two identifiers. The limitations of evaluation and management by telemedicine and the availability of in person appointments were discussed. The patient expressed understanding and agreed to proceed.   Patient location: Home Provider location: [place] [employer] Persons participating in the virtual visit: Myself and Patient     Katina Degree. [name], MD 07/27/2020 4:11 PM

## 2020-08-09 ENCOUNTER — Other Ambulatory Visit: Payer: Self-pay | Admitting: Family Medicine

## 2020-08-09 DIAGNOSIS — H8110 Benign paroxysmal vertigo, unspecified ear: Secondary | ICD-10-CM

## 2020-08-12 ENCOUNTER — Telehealth: Payer: Self-pay

## 2020-08-12 MED ORDER — TOPIRAMATE 50 MG PO TABS
50.0000 mg | ORAL_TABLET | Freq: Two times a day (BID) | ORAL | 1 refills | Status: DC
Start: 2020-08-12 — End: 2021-05-20

## 2020-08-12 NOTE — Telephone Encounter (Signed)
MEDICATION: topiramate (TOPAMAX) 50 MG tablet  PHARMACY:  [id number] [place], [place] - [address] Phone:  [phone]  Fax:  [phone]       Comments: Patient is completely   **Let patient know to contact pharmacy at the end of the day to make sure medication is ready. **  ** Please notify patient to allow 48-72 hours to process**  **Encourage patient to contact the pharmacy for refills or they can request refills through [facility]**

## 2020-08-12 NOTE — Telephone Encounter (Signed)
Rx sent 

## 2020-08-26 ENCOUNTER — Other Ambulatory Visit: Payer: Self-pay | Admitting: Family Medicine

## 2020-08-27 DIAGNOSIS — I639 Cerebral infarction, unspecified: Secondary | ICD-10-CM | POA: Diagnosis not present

## 2020-08-27 DIAGNOSIS — G43909 Migraine, unspecified, not intractable, without status migrainosus: Secondary | ICD-10-CM | POA: Diagnosis not present

## 2020-08-27 DIAGNOSIS — R0683 Snoring: Secondary | ICD-10-CM | POA: Diagnosis not present

## 2020-08-28 ENCOUNTER — Other Ambulatory Visit: Payer: Self-pay | Admitting: Family Medicine

## 2020-08-28 ENCOUNTER — Ambulatory Visit (INDEPENDENT_AMBULATORY_CARE_PROVIDER_SITE_OTHER): Payer: Medicare Other | Admitting: Family Medicine

## 2020-08-28 ENCOUNTER — Encounter: Payer: Self-pay | Admitting: Family Medicine

## 2020-08-28 ENCOUNTER — Other Ambulatory Visit: Payer: Self-pay | Admitting: Interventional Cardiology

## 2020-08-28 VITALS — Temp 95.6°F | Ht 68.0 in | Wt 190.0 lb

## 2020-08-28 DIAGNOSIS — J45909 Unspecified asthma, uncomplicated: Secondary | ICD-10-CM | POA: Diagnosis not present

## 2020-08-28 DIAGNOSIS — R509 Fever, unspecified: Secondary | ICD-10-CM | POA: Diagnosis not present

## 2020-08-28 MED ORDER — AZITHROMYCIN 250 MG PO TABS
ORAL_TABLET | ORAL | 0 refills | Status: DC
Start: 2020-08-28 — End: 2021-02-11

## 2020-08-28 MED ORDER — GUAIFENESIN-CODEINE 100-10 MG/5ML PO SOLN: 5.0000 mL | Freq: Three times a day (TID) | ORAL | 0 refills | Status: DC | PRN

## 2020-08-28 NOTE — Progress Notes (Signed)
   Julie Gilbert is a 69 y.o. female who presents today for a virtual office visit.  Assessment/Plan:  New/Acute Problems: Fever Concern for possible pulmonary infection given her other respiratory symptoms.  Need to check chest x-ray.  We will also check Covid test though this is less likely given the length of symptoms.  Check CBC, CMET, CRP, and sed rate.   Chronic Problems Addressed Today: Asthma Still with flare.  Concern for acute respiratory infection.  Will check chest x-ray to rule out pneumonia or other possible causes.  Will start azithromycin and guaifenesin-codeine cough syrup as needed about a month ago.  If symptoms persist may need referral to pulmonology depending on results of labs.     Subjective:  HPI:  Patient here for follow-up.  Seen about a month ago.  Was having respiratory infection symptoms at that time including cough, wheeze, congestion.  We treated her with course of codeine/guaifenesin cough syrup and azithromycin.  She had some improvement of symptoms however they have returned over the last couple weeks.  Patient states that she has had intermittent fevers since receiving Covid vaccine about 8 months ago.  Reports having a fever up to 103 this morning.  This is since resolved.  No reported chest pain.      Objective/Observations  Physical Exam: Gen: NAD, resting comfortably Pulm: Normal work of breathing Neuro: Grossly normal, moves all extremities Psych: Normal affect and thought content  Virtual Visit via Video   I connected with [name] on 08/28/20 at 10:00 AM EST by a video enabled telemedicine application and verified that I am speaking with the correct person using two identifiers. The limitations of evaluation and management by telemedicine and the availability of in person appointments were discussed. The patient expressed understanding and agreed to proceed.   Patient location: Home Provider location: [place] [employer] Persons participating in the virtual visit: Myself and Patient     Katina Degree. [name], MD 08/28/2020 12:24 PM

## 2020-08-28 NOTE — Assessment & Plan Note (Signed)
Still with flare.  Concern for acute respiratory infection.  Will check chest x-ray to rule out pneumonia or other possible causes.  Will start azithromycin and guaifenesin-codeine cough syrup as needed about a month ago.  If symptoms persist may need referral to pulmonology depending on results of labs.

## 2020-09-08 ENCOUNTER — Telehealth: Payer: Self-pay | Admitting: Family Medicine

## 2020-09-08 NOTE — Telephone Encounter (Signed)
Left message for patient to call back and schedule Medicare Annual Wellness Visit (AWV) either virtually OR in office.   Last AWV 08/09/19; please schedule at anytime with [facility]Nurse Health Advisor at [facility].  This should be a 45 minute visit.

## 2020-09-09 ENCOUNTER — Telehealth: Payer: Self-pay | Admitting: *Deleted

## 2020-09-09 NOTE — Telephone Encounter (Signed)
Patient call today stating Dr [name] was able to see her after hours and labs. Explain to patient. We do after hours Covid test pm mondays and thursday only. Last visit on 12/03 was told if symptoms not change we can do Covid test. Pt decline appointment. Advise she had future labs decline lab appointment

## 2020-10-16 ENCOUNTER — Other Ambulatory Visit: Payer: Self-pay | Admitting: Family Medicine

## 2020-10-16 ENCOUNTER — Other Ambulatory Visit: Payer: Self-pay | Admitting: Sports Medicine

## 2020-10-16 DIAGNOSIS — H8110 Benign paroxysmal vertigo, unspecified ear: Secondary | ICD-10-CM

## 2020-10-19 NOTE — Telephone Encounter (Signed)
Please advise 

## 2020-11-05 ENCOUNTER — Other Ambulatory Visit: Payer: Self-pay | Admitting: Family Medicine

## 2020-11-10 ENCOUNTER — Other Ambulatory Visit: Payer: Self-pay | Admitting: Family Medicine

## 2020-11-10 NOTE — Telephone Encounter (Signed)
Requesting Alternative medication

## 2020-11-10 NOTE — Telephone Encounter (Signed)
Requesting alternative.

## 2020-11-11 ENCOUNTER — Other Ambulatory Visit: Payer: Self-pay

## 2020-11-11 ENCOUNTER — Other Ambulatory Visit: Payer: Self-pay | Admitting: *Deleted

## 2020-11-11 MED ORDER — PANTOPRAZOLE SODIUM 40 MG PO TBEC: 40.0000 mg | DELAYED_RELEASE_TABLET | Freq: Every day | ORAL | 2 refills | Status: DC

## 2020-11-11 NOTE — Telephone Encounter (Signed)
Rx send to pharmacy  

## 2020-11-11 NOTE — Telephone Encounter (Signed)
Ok to send in protonix 40mg  daily but she will need to check with her insurance to see what is covered if protonix is not.

## 2020-11-12 ENCOUNTER — Other Ambulatory Visit: Payer: Self-pay | Admitting: Interventional Cardiology

## 2020-11-12 ENCOUNTER — Other Ambulatory Visit: Payer: Self-pay | Admitting: Family Medicine

## 2020-11-12 DIAGNOSIS — H8110 Benign paroxysmal vertigo, unspecified ear: Secondary | ICD-10-CM

## 2020-11-13 ENCOUNTER — Telehealth: Payer: Self-pay

## 2020-11-13 NOTE — Telephone Encounter (Signed)
Nurse Assessment Nurse: Egbert Garibaldi, RN, Stephan Minister Date/Time Lamount Cohen Time): 11/12/2020 7:15:09 PM Confirm and document reason for call. If symptomatic, describe symptoms. ---Caller states she has been out of her vertigo medication for 2 weeks and she has fallen twice. States she has a bruise on her shoulder. States she fell when she got up in the middle of the night to go to the bathroom. States she takes Meclizine 25mg  TID and Losartan 25mg  Daily. [phone] Does the patient have any new or worsening symptoms? ---No Nurse: , RN, [phone] Date/Time (Eastern Time): 11/12/2020 7:25:22 PM Please select the assessment type ---Refill Additional Documentation ---Meclizine 25mg  TID and Losartan 25mg  Daily. [facility] [phone] Does the patient have enough medication to last until the office opens? ---No Disp. Time 11/14/2020 Time) Disposition Final User 11/12/2020 6:49:13 PM Attempt made - message left 11/12/2020 7:34:29 PM Pharmacy Call Lamount Cohen, RN, 11/14/2020 Reason: Refills 11/12/2020 7:35:23 PM Attempt made - message left 11/14/2020 RN, [facility] 11/12/2020 7:41:59 PM Attempt made - message left 11/14/2020 11/12/2020 7:47:03 PM Attempt made - line busy [facility], RN, 11/14/2020 11/12/2020 7:49:08 PM Send To RN Personal 11/14/2020, RN, Egbert Garibaldi PLEASE NOTE: All timestamps contained within this report are represented as Stephan Minister Standard Time. CONFIDENTIALTY NOTICE: This fax transmission is intended only for the addressee. It contains information that is legally privileged, confidential or otherwise protected from use or disclosure. If you are not the intended recipient, you are strictly prohibited from reviewing, disclosing, copying using or disseminating any of this information or taking any action in reliance on or regarding this information. If you have received this fax in error, please notify 11/14/2020 immediately by telephone so that we can arrange for its return to Egbert Garibaldi. Phone: [phone], Toll-Free:  [phone], Fax: [phone] Page: 2 of 3 Call Id: [place] 11/12/2020 7:46:54 PM Clinical Call Yes [phone], RN, [phone] Verbal Orders/Maintenance Medications Medication Refill Route Dosage Regime Duration Admin Instructions User Name Meclizine Yes Oral 25mg  4 Days Take 1 tablet three times per day [id number], RN, Felisha Losartan Yes Oral 25mg  4 Days Take 1 tablet daily 11/14/2020, RN, Egbert Garibaldi Comments User: Stephan Minister, RN Date/Time (Eastern Time): 11/12/2020 7:54:08 PM Called patient back to relay that her prescription was called in; message left on her voicemail

## 2020-11-13 NOTE — Telephone Encounter (Signed)
FYI

## 2020-11-16 ENCOUNTER — Telehealth: Payer: Self-pay

## 2020-11-16 NOTE — Telephone Encounter (Signed)
MEDICATION: dexlansoprazole (DEXILANT) 60 MG capsule    meclizine (ANTIVERT) 25 MG tablet   PHARMACY:   [id number] [place],  - [address] Phone:  [phone]  Fax:  [phone]       Comments: Patient is completely out.   **Let patient know to contact pharmacy at the end of the day to make sure medication is ready. **  ** Please notify patient to allow 48-72 hours to process**  **Encourage patient to contact the pharmacy for refills or they can request refills through [facility]**

## 2020-11-17 ENCOUNTER — Other Ambulatory Visit: Payer: Self-pay | Admitting: Family Medicine

## 2020-11-17 DIAGNOSIS — H8110 Benign paroxysmal vertigo, unspecified ear: Secondary | ICD-10-CM

## 2020-11-17 NOTE — Telephone Encounter (Signed)
PA send today °

## 2020-11-17 NOTE — Telephone Encounter (Signed)
Rx was send  

## 2020-11-17 NOTE — Telephone Encounter (Signed)
Patient called in and stated that [facility] from [facility] [name]. Patient is very confused and would like a call back she said this has never happened before for these prescriptions.

## 2020-11-18 NOTE — Telephone Encounter (Signed)
[facility]#[id number] Dexlansoprazole 60MG  dr capsules     Status: Sent to Plan waiting for determination

## 2020-11-27 ENCOUNTER — Telehealth: Payer: Self-pay | Admitting: *Deleted

## 2020-11-27 ENCOUNTER — Other Ambulatory Visit: Payer: Self-pay | Admitting: Family Medicine

## 2020-11-27 DIAGNOSIS — H8110 Benign paroxysmal vertigo, unspecified ear: Secondary | ICD-10-CM

## 2020-11-27 NOTE — Telephone Encounter (Signed)
PA Rx Drug Dexlansoprazole 60MG  dr capsules Was denied per insurance

## 2020-11-28 NOTE — Telephone Encounter (Signed)
Noted. Please make sure patient is aware.  Julie Gilbert. [name], MD 11/28/2020 11:05 AM

## 2020-12-01 ENCOUNTER — Other Ambulatory Visit: Payer: Self-pay | Admitting: Family Medicine

## 2020-12-01 ENCOUNTER — Telehealth: Payer: Self-pay

## 2020-12-01 DIAGNOSIS — H8110 Benign paroxysmal vertigo, unspecified ear: Secondary | ICD-10-CM

## 2020-12-01 NOTE — Telephone Encounter (Signed)
Patient notified

## 2020-12-01 NOTE — Telephone Encounter (Signed)
Rx was send in on 11/14/2019 #90 with 1 refill

## 2020-12-01 NOTE — Telephone Encounter (Signed)
..   LAST APPOINTMENT DATE: 12/01/2020   NEXT APPOINTMENT DATE:@Visit  date not found  MEDICATION:meclizine (ANTIVERT) 25 MG tablet

## 2020-12-10 ENCOUNTER — Other Ambulatory Visit: Payer: Self-pay | Admitting: Family Medicine

## 2020-12-10 NOTE — Telephone Encounter (Signed)
Last order by Lyn Records, MD

## 2020-12-25 DIAGNOSIS — E119 Type 2 diabetes mellitus without complications: Secondary | ICD-10-CM | POA: Diagnosis not present

## 2021-01-04 ENCOUNTER — Ambulatory Visit: Payer: Medicare Other | Admitting: Interventional Cardiology

## 2021-01-07 ENCOUNTER — Ambulatory Visit: Payer: Medicare Other | Admitting: Interventional Cardiology

## 2021-01-07 ENCOUNTER — Telehealth: Payer: Self-pay

## 2021-01-07 NOTE — Telephone Encounter (Signed)
They request that we give the patient a call back and clarify of what all is going on with this medication.

## 2021-01-07 NOTE — Telephone Encounter (Signed)
[facility] is calling in stating the patient reached out to them as to why she isnt able to get any more meclizine (ANTIVERT) 25 MG tablet, Jazell states that this is not even covered under her insurance and does not know how or why she got this medication. Wondering if someone is able to give them a call back. To clarify.

## 2021-01-11 NOTE — Telephone Encounter (Signed)
Left message for patient to call clinic

## 2021-01-12 NOTE — Telephone Encounter (Signed)
Pt returned call

## 2021-01-12 NOTE — Telephone Encounter (Signed)
Left detailed message for patient to schedule appt to discuss medication

## 2021-01-13 ENCOUNTER — Telehealth: Payer: Self-pay | Admitting: Family Medicine

## 2021-01-13 NOTE — Progress Notes (Signed)
  Chronic Care Management   Outreach Note  01/13/2021 Name: ANEISHA SKYLES MRN: [id number] DOB: 06-20-1951  Referred by: Ardith Dark, MD Reason for referral : No chief complaint on file.   An unsuccessful telephone outreach was attempted today. The patient was referred to the pharmacist for assistance with care management and care coordination.   Follow Up Plan:   Carmell [place] Up[employer] Scheduler

## 2021-01-13 NOTE — Telephone Encounter (Signed)
Patient is calling in wondering if someone is able to call back. Asked if someone calls back and she doesn't answer to leave a detailed message. Deeksha wanted to update [place] that she spoke with [facility] yesterday and she was told that the medication was for hospice patients, not something they prescribe for patients like her. Patient explained that she was told from them that Dr.Parker is going to have to call and get more refills in order to cover.

## 2021-01-14 NOTE — Telephone Encounter (Signed)
Left detailed message for patient to schedule appt to discuss medications

## 2021-01-17 ENCOUNTER — Other Ambulatory Visit: Payer: Self-pay | Admitting: Sports Medicine

## 2021-01-17 ENCOUNTER — Other Ambulatory Visit: Payer: Self-pay | Admitting: Family Medicine

## 2021-01-17 DIAGNOSIS — H8110 Benign paroxysmal vertigo, unspecified ear: Secondary | ICD-10-CM

## 2021-01-18 NOTE — Telephone Encounter (Signed)
CAn we clarify the prednisone? I have approved everything else. I have not sent in prednisone in the past.

## 2021-01-18 NOTE — Telephone Encounter (Signed)
Please advise 

## 2021-01-18 NOTE — Telephone Encounter (Signed)
Pt requesting refills.

## 2021-01-19 ENCOUNTER — Other Ambulatory Visit: Payer: Self-pay | Admitting: *Deleted

## 2021-01-19 DIAGNOSIS — H8110 Benign paroxysmal vertigo, unspecified ear: Secondary | ICD-10-CM

## 2021-01-19 MED ORDER — MECLIZINE HCL 25 MG PO TABS: 25.0000 mg | ORAL_TABLET | Freq: Three times a day (TID) | ORAL | 0 refills | Status: DC

## 2021-01-19 NOTE — Telephone Encounter (Signed)
LVM to return call.

## 2021-01-19 NOTE — Telephone Encounter (Signed)
Patient stated did not requested prednisone  Unsure why pharmacy send request

## 2021-01-19 NOTE — Telephone Encounter (Signed)
Please see Dr. Lavone Neri response.

## 2021-01-25 ENCOUNTER — Other Ambulatory Visit: Payer: Self-pay | Admitting: Family Medicine

## 2021-01-25 DIAGNOSIS — H8110 Benign paroxysmal vertigo, unspecified ear: Secondary | ICD-10-CM

## 2021-01-26 ENCOUNTER — Telehealth: Payer: Self-pay | Admitting: Family Medicine

## 2021-01-26 NOTE — Chronic Care Management (AMB) (Signed)
  Chronic Care Management   Outreach Note  01/26/2021 Name: DELONA CLASBY MRN: [id number] DOB: 08-03-1951  Referred by: Ardith Dark, MD Reason for referral : No chief complaint on file.   A second unsuccessful telephone outreach was attempted today. The patient was referred to pharmacist for assistance with care management and care coordination.  Follow Up Plan:   Carmell [place] Upstream Scheduler

## 2021-02-07 ENCOUNTER — Other Ambulatory Visit: Payer: Self-pay | Admitting: Family Medicine

## 2021-02-11 ENCOUNTER — Other Ambulatory Visit: Payer: Self-pay

## 2021-02-11 ENCOUNTER — Ambulatory Visit (INDEPENDENT_AMBULATORY_CARE_PROVIDER_SITE_OTHER): Payer: Medicare Other | Admitting: Family Medicine

## 2021-02-11 VITALS — BP 149/97 | HR 61 | Temp 97.7°F | Ht 68.0 in | Wt 194.6 lb

## 2021-02-11 DIAGNOSIS — I152 Hypertension secondary to endocrine disorders: Secondary | ICD-10-CM

## 2021-02-11 DIAGNOSIS — Z9889 Other specified postprocedural states: Secondary | ICD-10-CM

## 2021-02-11 DIAGNOSIS — G43909 Migraine, unspecified, not intractable, without status migrainosus: Secondary | ICD-10-CM

## 2021-02-11 DIAGNOSIS — E1149 Type 2 diabetes mellitus with other diabetic neurological complication: Secondary | ICD-10-CM

## 2021-02-11 DIAGNOSIS — E1122 Type 2 diabetes mellitus with diabetic chronic kidney disease: Secondary | ICD-10-CM | POA: Diagnosis not present

## 2021-02-11 DIAGNOSIS — E1169 Type 2 diabetes mellitus with other specified complication: Secondary | ICD-10-CM | POA: Diagnosis not present

## 2021-02-11 DIAGNOSIS — E559 Vitamin D deficiency, unspecified: Secondary | ICD-10-CM

## 2021-02-11 DIAGNOSIS — E785 Hyperlipidemia, unspecified: Secondary | ICD-10-CM | POA: Diagnosis not present

## 2021-02-11 DIAGNOSIS — E1159 Type 2 diabetes mellitus with other circulatory complications: Secondary | ICD-10-CM | POA: Diagnosis not present

## 2021-02-11 DIAGNOSIS — N183 Chronic kidney disease, stage 3 unspecified: Secondary | ICD-10-CM

## 2021-02-11 DIAGNOSIS — Z6829 Body mass index (BMI) 29.0-29.9, adult: Secondary | ICD-10-CM

## 2021-02-11 DIAGNOSIS — E663 Overweight: Secondary | ICD-10-CM

## 2021-02-11 DIAGNOSIS — E538 Deficiency of other specified B group vitamins: Secondary | ICD-10-CM

## 2021-02-11 DIAGNOSIS — J309 Allergic rhinitis, unspecified: Secondary | ICD-10-CM

## 2021-02-11 DIAGNOSIS — K219 Gastro-esophageal reflux disease without esophagitis: Secondary | ICD-10-CM | POA: Diagnosis not present

## 2021-02-11 DIAGNOSIS — R42 Dizziness and giddiness: Secondary | ICD-10-CM

## 2021-02-11 DIAGNOSIS — F331 Major depressive disorder, recurrent, moderate: Secondary | ICD-10-CM

## 2021-02-11 DIAGNOSIS — J45909 Unspecified asthma, uncomplicated: Secondary | ICD-10-CM | POA: Diagnosis not present

## 2021-02-11 DIAGNOSIS — Z0001 Encounter for general adult medical examination with abnormal findings: Secondary | ICD-10-CM | POA: Diagnosis not present

## 2021-02-11 MED ORDER — OLOPATADINE HCL 0.2 % OP SOLN: OPHTHALMIC | 0 refills | Status: DC

## 2021-02-11 MED ORDER — PROMETHAZINE HCL 12.5 MG PO TABS
12.5000 mg | ORAL_TABLET | Freq: Three times a day (TID) | ORAL | 0 refills | Status: DC | PRN
Start: 2021-02-11 — End: 2022-02-17

## 2021-02-11 NOTE — Assessment & Plan Note (Signed)
Not currently on any medications.  We will recheck A1c today blood work.

## 2021-02-11 NOTE — Assessment & Plan Note (Signed)
Doing well on Lexapro 10 mg daily.  PHQ 2 score 0.

## 2021-02-11 NOTE — Patient Instructions (Signed)
It was very nice to see you today!  I will send in Pataday drops and refill your Phenergan today.  We will check blood work.  I will see back in 6 months.  Come back to see me sooner if needed.  Take care, Dr Jerline Pain  PLEASE NOTE:  If you had any lab tests please let us know if you have not heard back within a few days. You may see your results on mychart before we have a chance to review them but we will give you a call once they are reviewed by [place]. If we ordered any referrals today, please let us know if you have not heard from their office within the next week.   Please try these tips to maintain a healthy lifestyle:   Eat at least 3 REAL meals and 1-2 snacks per day.  Aim for no more than 5 hours between eating.  If you eat breakfast, please do so within one hour of getting up.    Each meal should contain half fruits/vegetables, one quarter protein, and one quarter carbs (no bigger than a computer mouse)   Cut down on sweet beverages. This includes juice, soda, and sweet tea.     Drink at least 1 glass of water with each meal and aim for at least 8 glasses per day   Exercise at least 150 minutes every week.    Preventive Care 41 Years and Older, Female Preventive care refers to lifestyle choices and visits with your health care provider that can promote health and wellness. This includes:  A yearly physical exam. This is also called an annual wellness visit.  Regular dental and eye exams.  Immunizations.  Screening for certain conditions.  Healthy lifestyle choices, such as: ? Eating a healthy diet. ? Getting regular exercise. ? Not using drugs or products that contain nicotine and tobacco. ? Limiting alcohol use. What can I expect for my preventive care visit? Physical exam Your health care provider will check your:  Height and weight. These may be used to calculate your BMI (body mass index). BMI is a measurement that tells if you are at a healthy  weight.  Heart rate and blood pressure.  Body temperature.  Skin for abnormal spots. Counseling Your health care provider may ask you questions about your:  Past medical problems.  Family's medical history.  Alcohol, tobacco, and drug use.  Emotional well-being.  Home life and relationship well-being.  Sexual activity.  Diet, exercise, and sleep habits.  History of falls.  Memory and ability to understand (cognition).  Work and work Statistician.  Pregnancy and menstrual history.  Access to firearms. What immunizations do I need? Vaccines are usually given at various ages, according to a schedule. Your health care provider will recommend vaccines for you based on your age, medical history, and lifestyle or other factors, such as travel or where you work.   What tests do I need? Blood tests  Lipid and cholesterol levels. These may be checked every 5 years, or more often depending on your overall health.  Hepatitis C test.  Hepatitis B test. Screening  Lung cancer screening. You may have this screening every year starting at age 44 if you have a 30-pack-year history of smoking and currently smoke or have quit within the past 15 years.  Colorectal cancer screening. ? All adults should have this screening starting at age 81 and continuing until age 7. ? Your health care provider may recommend screening at age 5  if you are at increased risk. ? You will have tests every 1-10 years, depending on your results and the type of screening test.  Diabetes screening. ? This is done by checking your blood sugar (glucose) after you have not eaten for a while (fasting). ? You may have this done every 1-3 years.  Mammogram. ? This may be done every 1-2 years. ? Talk with your health care provider about how often you should have regular mammograms.  Abdominal aortic aneurysm (AAA) screening. You may need this if you are a current or former smoker.  BRCA-related cancer  screening. This may be done if you have a family history of breast, ovarian, tubal, or peritoneal cancers. Other tests  STD (sexually transmitted disease) testing, if you are at risk.  Bone density scan. This is done to screen for osteoporosis. You may have this done starting at age 12. Talk with your health care provider about your test results, treatment options, and if necessary, the need for more tests. Follow these instructions at home: Eating and drinking  Eat a diet that includes fresh fruits and vegetables, whole grains, lean protein, and low-fat dairy products. Limit your intake of foods with high amounts of sugar, saturated fats, and salt.  Take vitamin and mineral supplements as recommended by your health care provider.  Do not drink alcohol if your health care provider tells you not to drink.  If you drink alcohol: ? Limit how much you have to 0-1 drink a day. ? Be aware of how much alcohol is in your drink. In the [place] one drink equals one 12 oz bottle of beer (355 mL), one 5 oz glass of wine (148 mL), or one 1 oz glass of hard liquor (44 mL).   Lifestyle  Take daily care of your teeth and gums. Brush your teeth every morning and night with fluoride toothpaste. Floss one time each day.  Stay active. Exercise for at least 30 minutes 5 or more days each week.  Do not use any products that contain nicotine or tobacco, such as cigarettes, e-cigarettes, and chewing tobacco. If you need help quitting, ask your health care provider.  Do not use drugs.  If you are sexually active, practice safe sex. Use a condom or other form of protection in order to prevent STIs (sexually transmitted infections).  Talk with your health care provider about taking a low-dose aspirin or statin.  Find healthy ways to cope with stress, such as: ? Meditation, yoga, or listening to music. ? Journaling. ? Talking to a trusted person. ? Spending time with friends and family. Safety  Always  wear your seat belt while driving or riding in a vehicle.  Do not drive: ? If you have been drinking alcohol. Do not ride with someone who has been drinking. ? When you are tired or distracted. ? While texting.  Wear a helmet and other protective equipment during sports activities.  If you have firearms in your house, make sure you follow all gun safety procedures. What's next?  Visit your health care provider once a year for an annual wellness visit.  Ask your health care provider how often you should have your eyes and teeth checked.  Stay up to date on all vaccines. This information is not intended to replace advice given to you by your health care provider. Make sure you discuss any questions you have with your health care provider. Document Revised: 09/02/2020 Document Reviewed: 09/06/2018 Elsevier Patient Education  2021 [employer].

## 2021-02-11 NOTE — Progress Notes (Signed)
Chief Complaint:  DICIE EDELEN is a 70 y.o. female who presents today for her annual comprehensive physical exam.    Assessment/Plan:  Chronic Problems Addressed Today: Gastroesophageal reflux disease without esophagitis Stable on Protonix 40 mg daily.  Migraine headache Follows with neurology.  She is on [employer].  Will be transferring care to a local neurologist.  Type 2 diabetes mellitus with neurological complications [facility]) Not currently on any medications.  We will recheck A1c today blood work.  Hypertension associated with diabetes ([place]) Slightly above goal.  Has previously been well controlled.  Continue Norvasc 5 mg daily and Imdur 30 mg daily.  Allergic rhinitis Did not improve with Singulair.  We will stop Singulair today.  Conjunctivitis seems to be the most predominant symptom at this point.  We will start Pataday drops.  Vertigo Again discussed physical therapy referral.  Deferred for today though will let me know if they would like to have referral placed.  B12 deficiency Check B12 level.  Asthma Stable.  Uses albuterol as needed.  Also on Symbicort daily.  Declined referral to pulmonology or allergy at this point.  CKD stage 3 due to type 2 diabetes mellitus (HCC) Check c-Met.  Dyslipidemia associated with type 2 diabetes mellitus (HCC) Continue Lipitor 80 mg daily.  We will check lipids today.x  Moderate episode of recurrent major depressive disorder (HCC) Doing well on Lexapro 10 mg daily.  PHQ 2 score 0.   Body mass index is 29.59 kg/m. / Overweight  BMI Metric Follow Up - 02/11/21 1504      BMI Metric Follow Up-Please document annually   BMI Metric Follow Up Education provided             Preventative Healthcare: We will check labs today.  Up-to-date on colon cancer screening.  Gets mammogram later this year.  Patient Counseling(The following topics were reviewed and/or handout was given):  -Nutrition: Stressed importance of moderation  in sodium/caffeine intake, saturated fat and cholesterol, caloric balance, sufficient intake of fresh fruits, vegetables, and fiber.  -Stressed the importance of regular exercise.   -Substance Abuse: Discussed cessation/primary prevention of tobacco, alcohol, or other drug use; driving or other dangerous activities under the influence; availability of treatment for abuse.   -Injury prevention: Discussed safety belts, safety helmets, smoke detector, smoking near bedding or upholstery.   -Sexuality: Discussed sexually transmitted diseases, partner selection, use of condoms, avoidance of unintended pregnancy and contraceptive alternatives.   -Dental health: Discussed importance of regular tooth brushing, flossing, and dental visits.  -Health maintenance and immunizations reviewed. Please refer to Health maintenance section.  Return to care in 1 year for next preventative visit.     Subjective:  HPI:  She has no acute complaints today. See A/p.   Lifestyle Diet: Balanced. Exercise: Likes to walk daily.  Depression screen [facility] 2/9 02/27/2020  Decreased Interest 0  Down, Depressed, Hopeless 0  PHQ - 2 Score 0  Altered sleeping 1  Tired, decreased energy 1  Change in appetite 1  Feeling bad or failure about yourself  1  Trouble concentrating 0  Moving slowly or fidgety/restless 1  Suicidal thoughts 0  PHQ-9 Score 5  Difficult doing work/chores Somewhat difficult    Health Maintenance Due  Topic Date Due  . FOOT EXAM  12/13/2016  . DEXA SCAN  Never done  . PNA vac Low Risk Adult (1 of 2 - PCV13) Never done  . HEMOGLOBIN A1C  08/28/2020  . OPHTHALMOLOGY EXAM  12/09/2020  ROS: Per HPI, otherwise a complete review of systems was negative.   PMH:  The following were reviewed and entered/updated in epic: Past Medical History:  Diagnosis Date  . Anxiety 05/18/2018  . Asthma   . Chronic anemia 06/29/2016  . Chronic pain of both knees 02/15/2019  . CKD stage 3 due to type 2  diabetes mellitus ([place]) 05/18/2018  . Coronary artery disease 10/01/2018  . Diabetes mellitus without complication ([place])   . Dyslipidemia associated with type 2 diabetes mellitus ([place]) 05/18/2018   Lab Results Component Value Date  CHOL 198 08/17/2018  HDL 62.40 08/17/2018  LDLCALC 113 (H) 08/17/2018  TRIG 113.0 08/17/2018  CHOLHDL 3 08/17/2018    . Gastroesophageal reflux disease without esophagitis 06/18/2015  . Herniated intervertebral disc of lumbar spine 05/01/2017  . Hypertension   . Hypertension associated with diabetes ([place]) 05/13/2015  . IBS (irritable bowel syndrome)   . Insomnia 05/13/2015  . Migraine headache 05/13/2015  . Moderate episode of recurrent major depressive disorder ([place]) 01/04/2017  . Osteoarthritis of spine 02/03/2017  . Transient ischemic attack (TIA) 06/28/2016  . Type 2 diabetes mellitus with neurological complications ([place]) 0/35/5974   Lab Results Component Value Date  HGBA1C 5.9 08/17/2018  HGBA1C 5.8 01/04/2017  HGBA1C 6.4 (H) 06/29/2016  Lab Results Component Value Date  MICROALBUR 10.3 (H) 08/17/2018  LDLCALC 113 (H) 08/17/2018  CREATININE 1.56 (H) 08/17/2018     Patient Active Problem List   Diagnosis Date Noted  . Allergic rhinitis 02/11/2021  . Vertigo 02/27/2020  . B12 deficiency 02/11/2020  . Iron deficiency 02/11/2020  . Inflamed seborrheic keratosis 01/30/2020  . Asthma 06/27/2019  . Chronic pain of both knees 02/15/2019  . Coronary artery disease 10/01/2018  . Dyslipidemia associated with type 2 diabetes mellitus ([place]) 05/18/2018  . Anxiety 05/18/2018  . CKD stage 3 due to type 2 diabetes mellitus ([place]) 05/18/2018  . Chest pain syndrome 10/27/2017  . Herniated intervertebral disc of lumbar spine 05/01/2017  . Osteoarthritis of spine 02/03/2017  . Moderate episode of recurrent major depressive disorder ([place]) 01/04/2017  . Chronic anemia 06/29/2016  . Gastroesophageal reflux disease without esophagitis 06/18/2015  . Hypertension associated with diabetes  ([place]) 05/13/2015  . Type 2 diabetes mellitus with neurological complications ([place]) 16/38/4536  . IBS (irritable bowel syndrome) 05/13/2015  . Migraine headache 05/13/2015  . Insomnia 05/13/2015   Past Surgical History:  Procedure Laterality Date  . CESAREAN SECTION    . EXPLORATORY LAPAROTOMY      Family History  Problem Relation Age of Onset  . Cancer Maternal Grandfather   . Kidney failure Mother   . Hypertension Mother   . Heart disease Father   . Breast cancer Neg Hx     Medications- reviewed and updated Current Outpatient Medications  Medication Sig Dispense Refill  . albuterol (VENTOLIN HFA) 108 (90 Base) MCG/ACT inhaler TAKE 2 PUFFS BY MOUTH EVERY 6 HOURS AS NEEDED FOR WHEEZE OR SHORTNESS OF BREATH 6.7 each 6  . amLODipine (NORVASC) 5 MG tablet TAKE 1 TABLET BY MOUTH EVERY DAY 90 tablet 3  . Ascorbic Acid (VITAMIN C) 1000 MG tablet Take 1,000 mg by mouth daily.    Marland Kitchen aspirin 81 MG tablet Take 81 mg by mouth daily.    Marland Kitchen atorvastatin (LIPITOR) 80 MG tablet Take 80 mg by mouth daily.    . Blood Glucose Monitoring Suppl (ACCU-CHEK AVIVA PLUS) w/Device KIT Use as directed to check blood sugar once daily. E11.9 1 kit 0  .  Blood Pressure Monitoring (BLOOD PRESSURE KIT) DEVI 1 application by Does not apply route daily. Use to check blood pressure daily 1 Device 0  . Cholecalciferol 1000 units tablet Take 1,000 Units by mouth daily.    . clonazePAM (KLONOPIN) 1 MG tablet TAKE 1 TABLET BY MOUTH 3 TIMES DAILY. 270 tablet 3  . COD LIVER OIL PO Take 500 Units by mouth daily.    Marland Kitchen docusate sodium (COLACE) 100 MG capsule Take 1 capsule (100 mg total) by mouth 2 (two) times daily. 10 capsule 0  . EMGALITY 120 MG/ML SOAJ Inject as directed See admin instructions.    Marland Kitchen escitalopram (LEXAPRO) 10 MG tablet TAKE 1 TABLET BY MOUTH EVERY DAY 90 tablet 0  . Fish Oil-Cholecalciferol (FISH OIL + D3) 1000-1000 MG-UNIT CAPS Take 1 tablet by mouth daily.    Marland Kitchen glucose blood (ACCU-CHEK AVIVA PLUS) test  strip Use as instructed to check blood sugar once daily. E11.9 100 each 12  . Lancets (ACCU-CHEK SOFT TOUCH) lancets Use as instructed to check blood sugar once daily E11.9 100 each 12  . losartan (COZAAR) 25 MG tablet TAKE 1 TABLET BY MOUTH EVERY DAY 90 tablet 0  . MAGNESIUM PO Take 840 mg by mouth daily. 1 1/2 daily    . meclizine (ANTIVERT) 25 MG tablet Take 1 tablet (25 mg total) by mouth 3 (three) times daily. 180 tablet 0  . Multiple Vitamin (MULTIVITAMIN WITH MINERALS) TABS tablet Take 1 tablet by mouth daily.    Marland Kitchen neomycin-polymyxin-hydrocortisone (CORTISPORIN) OTIC solution Apply 1-2 drops to the toe after soaking toe twice a day 10 mL 0  . nitroGLYCERIN (NITROSTAT) 0.4 MG SL tablet PLACE 1 TABLET (0.4 MG TOTAL) UNDER THE TONGUE EVERY 5 (FIVE) MINUTES AS NEEDED FOR CHEST PAIN. 25 tablet 10  . Olopatadine HCl (PATADAY) 0.2 % SOLN Apply once daily 2.5 mL 0  . pantoprazole (PROTONIX) 40 MG tablet TAKE 1 TABLET BY MOUTH EVERY DAY 90 tablet 0  . predniSONE (DELTASONE) 20 MG tablet Take 20 mg by mouth every morning.    . Riboflavin (VITAMIN B-2 PO) Take 250 mg by mouth daily.    . silver nitrate applicators 56-21 % applicator Apply topically 3 (three) times a week. 100 each 0  . SYMBICORT 160-4.5 MCG/ACT inhaler TAKE 2 PUFFS BY MOUTH TWICE A DAY 30.6 each 3  . topiramate (TOPAMAX) 50 MG tablet Take 1 tablet (50 mg total) by mouth 2 (two) times daily. 180 tablet 1  . promethazine (PHENERGAN) 12.5 MG tablet Take 1 tablet (12.5 mg total) by mouth every 8 (eight) hours as needed for nausea or vomiting. 20 tablet 0   No current facility-administered medications for this visit.    Allergies-reviewed and updated Allergies  Allergen Reactions  . Peanuts [Peanut Oil] Anaphylaxis  . Shrimp [Shellfish Allergy] Anaphylaxis  . Latex Hives  . Elemental Sulfur Itching and Swelling  . Penicillins Hives    Has patient had a PCN reaction causing immediate rash, facial/tongue/throat swelling, SOB or  lightheadedness with hypotension: No Has patient had a PCN reaction causing severe rash involving mucus membranes or skin necrosis: No Has patient had a PCN reaction that required hospitalization: No Has patient had a PCN reaction occurring within the last 10 years: No  If all of the above answers are "NO", then may proceed with Cephalosporin use.    Marland Kitchen Pineapple     Scratchy throat, Tongue swelling  . Sulfa Antibiotics     Social History   Socioeconomic  History  . Marital status: Divorced    Spouse name: Not on file  . Number of children: Not on file  . Years of education: Not on file  . Highest education level: Not on file  Occupational History  . Not on file  Tobacco Use  . Smoking status: Former Smoker    Quit date: 09/22/2012    Years since quitting: 8.3  . Smokeless tobacco: Never Used  Vaping Use  . Vaping Use: Never used  Substance and Sexual Activity  . Alcohol use: Yes    Alcohol/week: 1.0 standard drink    Types: 1 Glasses of wine per week    Comment: red wine 1-2 x month  . Drug use: No  . Sexual activity: Not Currently  Other Topics Concern  . Not on file  Social History Narrative   Lives with daughter Brayah Urquilla)    Social Determinants of Health   Financial Resource Strain: Not on file  Food Insecurity: Not on file  Transportation Needs: Not on file  Physical Activity: Not on file  Stress: Not on file  Social Connections: Not on file        Objective:  Physical Exam: BP (!) 149/97   Pulse 61   Temp 97.7 F (36.5 C) (Temporal)   Ht 5' 8" (1.727 m)   Wt 194 lb 9.6 oz (88.3 kg)   LMP  (LMP Unknown) Comment: Menopausal  SpO2 99%   BMI 29.59 kg/m   Body mass index is 29.59 kg/m. Wt Readings from Last 3 Encounters:  02/11/21 194 lb 9.6 oz (88.3 kg)  08/28/20 190 lb (86.2 kg)  07/27/20 193 lb (87.5 kg)   Gen: NAD, resting comfortably HEENT: TMs normal bilaterally. OP clear. No thyromegaly noted.  CV: RRR with no murmurs  appreciated Pulm: NWOB, CTAB with no crackles, wheezes, or rhonchi GI: Normal bowel sounds present. Soft, Nontender, Nondistended. MSK: no edema, cyanosis, or clubbing noted Skin: warm, dry Neuro: CN2-12 grossly intact. Strength 5/5 in upper and lower extremities. Reflexes symmetric and intact bilaterally.  Psych: Normal affect and thought content     Caleb M. Jerline Pain, MD 02/11/2021 3:04 PM

## 2021-02-11 NOTE — Assessment & Plan Note (Signed)
Follows with neurology.  She is on [employer].  Will be transferring care to a local neurologist.

## 2021-02-11 NOTE — Assessment & Plan Note (Signed)
Check c-Met.

## 2021-02-11 NOTE — Assessment & Plan Note (Signed)
Did not improve with Singulair.  We will stop Singulair today.  Conjunctivitis seems to be the most predominant symptom at this point.  We will start Pataday drops.

## 2021-02-11 NOTE — Assessment & Plan Note (Signed)
Stable on Protonix 40 mg daily. 

## 2021-02-11 NOTE — Assessment & Plan Note (Signed)
Slightly above goal.  Has previously been well controlled.  Continue Norvasc 5 mg daily and Imdur 30 mg daily.

## 2021-02-11 NOTE — Assessment & Plan Note (Signed)
Check B12 level. 

## 2021-02-11 NOTE — Assessment & Plan Note (Signed)
Stable.  Uses albuterol as needed.  Also on Symbicort daily.  Declined referral to pulmonology or allergy at this point.

## 2021-02-11 NOTE — Assessment & Plan Note (Signed)
Again discussed physical therapy referral.  Deferred for today though will let me know if they would like to have referral placed.

## 2021-02-11 NOTE — Assessment & Plan Note (Signed)
Continue Lipitor 80 mg daily.  We will check lipids today.x

## 2021-02-12 ENCOUNTER — Other Ambulatory Visit: Payer: Self-pay | Admitting: Family Medicine

## 2021-02-12 LAB — COMPREHENSIVE METABOLIC PANEL
ALT: 8 U/L (ref 0–35)
AST: 13 U/L (ref 0–37)
Albumin: 4.4 g/dL (ref 3.5–5.2)
Alkaline Phosphatase: 64 U/L (ref 39–117)
BUN: 18 mg/dL (ref 6–23)
CO2: 23 mEq/L (ref 19–32)
Calcium: 9.1 mg/dL (ref 8.4–10.5)
Chloride: 108 mEq/L (ref 96–112)
Creatinine, Ser: 1.83 mg/dL — ABNORMAL HIGH (ref 0.40–1.20)
GFR: 28.13 mL/min — ABNORMAL LOW (ref >60.00)
Glucose, Bld: 121 mg/dL — ABNORMAL HIGH (ref 70–99)
Potassium: 3.7 mEq/L (ref 3.5–5.1)
Sodium: 141 mEq/L (ref 135–145)
Total Bilirubin: 0.5 mg/dL (ref 0.2–1.2)
Total Protein: 7.3 g/dL (ref 6.0–8.3)

## 2021-02-12 LAB — LIPID PANEL
Cholesterol: 355 mg/dL — ABNORMAL HIGH (ref 0–200)
HDL: 71.3 mg/dL (ref >39.00)
LDL Cholesterol: 246 mg/dL — ABNORMAL HIGH (ref 0–99)
NonHDL: 283.75
Total CHOL/HDL Ratio: 5
Triglycerides: 189 mg/dL — ABNORMAL HIGH (ref 0.0–149.0)
VLDL: 37.8 mg/dL (ref 0.0–40.0)

## 2021-02-12 LAB — CBC
HCT: 36.9 % (ref 36.0–46.0)
Hemoglobin: 12.3 g/dL (ref 12.0–15.0)
MCHC: 33.2 g/dL (ref 30.0–36.0)
MCV: 91.1 fl (ref 78.0–100.0)
Platelets: 180 10*3/uL (ref 150.0–400.0)
RBC: 4.05 Mil/uL (ref 3.87–5.11)
RDW: 14.6 % (ref 11.5–15.5)
WBC: 4.5 10*3/uL (ref 4.0–10.5)

## 2021-02-12 LAB — TSH: TSH: 2.48 u[IU]/mL (ref 0.35–4.50)

## 2021-02-12 LAB — VITAMIN D 25 HYDROXY (VIT D DEFICIENCY, FRACTURES): VITD: 67.46 ng/mL (ref 30.00–100.00)

## 2021-02-12 LAB — HEMOGLOBIN A1C: Hgb A1c MFr Bld: 6 % (ref 4.6–6.5)

## 2021-02-12 LAB — VITAMIN B12: Vitamin B-12: >1550 pg/mL — ABNORMAL HIGH (ref 211–911)

## 2021-02-12 NOTE — Progress Notes (Signed)
Please inform patient of the following:  Her LDL is extremely high. Please make sure that she has been taking her lipitor daily. If she has been then I would recommend we get her to the lipid clinic to discuss other ways to lower her cholesterol levels. Her kidney numbers are up a bit also. Recommend she get plenty of fluids and come back in 1-2 weeks to recheck BMET.  Everything else is NORMAL.  Julie Gilbert. [name], MD 02/12/2021 4:10 PM

## 2021-02-15 NOTE — Telephone Encounter (Signed)
Pt requesting refill for Azelastine 0.1% please advise if okay to fill?

## 2021-02-18 ENCOUNTER — Ambulatory Visit: Payer: Medicare Other | Admitting: Family Medicine

## 2021-02-18 ENCOUNTER — Other Ambulatory Visit: Payer: Self-pay | Admitting: *Deleted

## 2021-02-18 DIAGNOSIS — E1159 Type 2 diabetes mellitus with other circulatory complications: Secondary | ICD-10-CM

## 2021-02-26 ENCOUNTER — Telehealth: Payer: Self-pay | Admitting: Family Medicine

## 2021-02-26 NOTE — Progress Notes (Signed)
  Chronic Care Management   Outreach Note  02/26/2021 Name: Julie Gilbert MRN: [id number] DOB: 02-03-1951  Referred by: Ardith Dark, MD Reason for referral : No chief complaint on file.   Third unsuccessful telephone outreach was attempted today. The patient was referred to the pharmacist for assistance with care management and care coordination.   Follow Up Plan:   Carmell [place] [employer] Scheduler

## 2021-03-01 ENCOUNTER — Other Ambulatory Visit: Payer: Self-pay | Admitting: Family Medicine

## 2021-03-01 DIAGNOSIS — Z1231 Encounter for screening mammogram for malignant neoplasm of breast: Secondary | ICD-10-CM

## 2021-03-05 ENCOUNTER — Other Ambulatory Visit: Payer: Self-pay | Admitting: Family Medicine

## 2021-03-12 ENCOUNTER — Other Ambulatory Visit: Payer: Self-pay | Admitting: Family Medicine

## 2021-03-22 ENCOUNTER — Telehealth: Payer: Self-pay

## 2021-03-22 ENCOUNTER — Other Ambulatory Visit: Payer: Self-pay | Admitting: Family Medicine

## 2021-03-22 DIAGNOSIS — H8110 Benign paroxysmal vertigo, unspecified ear: Secondary | ICD-10-CM

## 2021-03-22 MED ORDER — LOSARTAN POTASSIUM 25 MG PO TABS: 25.0000 mg | ORAL_TABLET | Freq: Every day | ORAL | 3 refills | Status: DC

## 2021-03-22 MED ORDER — MECLIZINE HCL 25 MG PO TABS: 25.0000 mg | ORAL_TABLET | Freq: Three times a day (TID) | ORAL | 3 refills | Status: DC

## 2021-03-22 MED ORDER — AMLODIPINE BESYLATE 5 MG PO TABS: 5.0000 mg | ORAL_TABLET | Freq: Every day | ORAL | 3 refills | Status: DC

## 2021-03-22 MED ORDER — ESCITALOPRAM OXALATE 10 MG PO TABS: 10.0000 mg | ORAL_TABLET | Freq: Every day | ORAL | 3 refills | Status: DC

## 2021-03-22 NOTE — Telephone Encounter (Signed)
Please make sure her neurologist isnt prescribing this.  Julie Gilbert. [name], MD 03/22/2021 3:59 PM

## 2021-03-22 NOTE — Telephone Encounter (Signed)
.   LAST APPOINTMENT DATE: 03/22/2021   NEXT APPOINTMENT DATE:@Visit  date not found  MEDICATION:  losartan (COZAAR) 25 MG tablet  meclizine (ANTIVERT) 25 MG tablet  escitalopram (LEXAPRO) 10 MG tablet  amLODipine (NORVASC) 5 MG tablet  DEXILANT 60MG   EMGALITY 120 MG/ML SOAJ   PHARMACY:[id number] - [place], [place] - [address]

## 2021-03-22 NOTE — Telephone Encounter (Signed)
Emaglity last filled by historical provider, ok to refill? What should the instructions be?

## 2021-03-23 MED ORDER — EMGALITY 120 MG/ML ~~LOC~~ SOAJ: SUBCUTANEOUS | 6 refills | Status: DC

## 2021-03-23 NOTE — Telephone Encounter (Signed)
Patient stated she is not getting it from neurologist.

## 2021-03-23 NOTE — Telephone Encounter (Signed)
Ok to send in emgality inject 120mg  monthly.  . Katina Degree, MD 03/23/2021 2:10 PM

## 2021-03-23 NOTE — Telephone Encounter (Signed)
Lm for pt tcb. 

## 2021-03-23 NOTE — Telephone Encounter (Signed)
Rx filled

## 2021-03-23 NOTE — Addendum Note (Signed)
Addended by: Lieutenant Diego A on: 03/23/2021 03:03 PM   Modules accepted: Orders

## 2021-03-23 NOTE — Telephone Encounter (Signed)
See below

## 2021-04-09 ENCOUNTER — Telehealth: Payer: Self-pay | Admitting: Family Medicine

## 2021-04-09 NOTE — Telephone Encounter (Signed)
Copied from CRM [id number]. Topic: Medicare AWV >> Apr 09, 2021 11:43 AM Harris[place], [place]on Gully wrote: Reason for CRM: Left message for patient to schedule Annual Wellness Visit.  Please schedule with Nurse Health Advisor Lanier Ensign, RN at [facility].

## 2021-04-14 ENCOUNTER — Telehealth: Payer: Self-pay | Admitting: Interventional Cardiology

## 2021-04-14 NOTE — Telephone Encounter (Signed)
Spoke with the patient who reports that she has been having pain on the left side of her body for about 6 months now. She states that pain is located under her arm beside her breast. She states that pain has been constant and describes it as a pressure. She states that she has difficulty using the left side of her body. She is unable to lay on that side at night due to the pain. She has to prop her arm up on a pillow in order to sleep comfortable. When asked about being able to reproduce the pain by pressing on the area she states that she avoids putting any pressure on the area so is not sure however it does hurt to sleep on. Reports that it takes a lot of effort to use upper left side of her body. Denies any numbness or tingling.  Patient also reports that she has been having shortness of breath for about 6 months now. She reports SOB with minimal exertion. SOB improved with rest. Denies any swelling.  Patient also complains of fatigue. States that she does not have as much energy as she used to.  Patient reports that home health nurse checked her blood pressure this week and it was 100/48. She denies any dizziness.  Patient reports that she has been taking nitroglycerin for her pain. She states that it helps some. Patient has been educated on indications and use of nitroglycerin as well as when to call 911. Patient verbalized understanding.  Patient currently has a one year follow up with Dr. Katrinka Blazing on 8/18.

## 2021-04-14 NOTE — Telephone Encounter (Signed)
In pain from the blockage he she removed. She very weak.  Pt c/o Shortness Of Breath: STAT if SOB developed within the last 24 hours or pt is noticeably SOB on the phone  1. Are you currently SOB (can you hear that pt is SOB on the phone)? yes  2. How long have you been experiencing SOB? For while   3. Are you SOB when sitting or when up moving around? both  4. Are you currently experiencing any other symptoms? Headaches, cant put any pressure on her left side he hurts too much. In pain from the blockage he she removed. She very weak.

## 2021-04-16 NOTE — Telephone Encounter (Signed)
Unless I am missing something, we have not seen her is greater than a year. I don't understand what blockage she refers to and am doubtful symptoms are cardiac. However we need to see her and she should work with PCP.

## 2021-04-16 NOTE — Telephone Encounter (Signed)
Left message to call back  

## 2021-04-19 ENCOUNTER — Ambulatory Visit: Payer: Medicare Other

## 2021-04-19 NOTE — Telephone Encounter (Signed)
Left message to call back  

## 2021-04-22 ENCOUNTER — Ambulatory Visit (INDEPENDENT_AMBULATORY_CARE_PROVIDER_SITE_OTHER): Payer: Medicare Other | Admitting: Family Medicine

## 2021-04-22 ENCOUNTER — Encounter: Payer: Self-pay | Admitting: Family Medicine

## 2021-04-22 VITALS — BP 119/80 | HR 56 | Temp 98.1°F | Ht 68.0 in | Wt 185.0 lb

## 2021-04-22 DIAGNOSIS — G8929 Other chronic pain: Secondary | ICD-10-CM

## 2021-04-22 DIAGNOSIS — M25561 Pain in right knee: Secondary | ICD-10-CM | POA: Diagnosis not present

## 2021-04-22 DIAGNOSIS — M25562 Pain in left knee: Secondary | ICD-10-CM | POA: Diagnosis not present

## 2021-04-22 MED ORDER — DICLOFENAC SODIUM 75 MG PO TBEC: 75.0000 mg | DELAYED_RELEASE_TABLET | Freq: Two times a day (BID) | ORAL | 0 refills | Status: DC

## 2021-04-22 NOTE — Assessment & Plan Note (Signed)
She has underlying osteoarthritis though has had worsening of pain in her left knee due to trauma this morning after striking it on a bedpost.  Given her degree of pain in addition to instability with gait we will check x-ray to rule out fracture or any other abnormality.  We will start diclofenac for pain.  Advised her to keep the area with compression and ice.  May need referral to sports medicine or orthopedics depending on xray result.

## 2021-04-22 NOTE — Patient Instructions (Addendum)
It was very nice to see you today!  We will get an x-ray of your knee.  Please use ice and compression to the area.  We will start diclofenac.  Let us know if your symptoms worsen.  We may need to have you see a sports medicine doctor or orthopedist depending on results of your x-ray.  Take care, Dr [name]  PLEASE NOTE:  If you had any lab tests please let us know if you have not heard back within a few days. You may see your results on mychart before we have a chance to review them but we will give you a call once they are reviewed by [place]. If we ordered any referrals today, please let us know if you have not heard from their office within the next week.   Please try these tips to maintain a healthy lifestyle:  Eat at least 3 REAL meals and 1-2 snacks per day.  Aim for no more than 5 hours between eating.  If you eat breakfast, please do so within one hour of getting up.   Each meal should contain half fruits/vegetables, one quarter protein, and one quarter carbs (no bigger than a computer mouse)  Cut down on sweet beverages. This includes juice, soda, and sweet tea.   Drink at least 1 glass of water with each meal and aim for at least 8 glasses per day  Exercise at least 150 minutes every week.

## 2021-04-22 NOTE — Addendum Note (Signed)
Addended by: Dyann Kief on: 04/22/2021 10:49 AM   Modules accepted: Orders

## 2021-04-22 NOTE — Telephone Encounter (Signed)
Left message to call back  

## 2021-04-22 NOTE — Progress Notes (Signed)
   Julie Gilbert is a 70 y.o. female who presents today for an office visit.  Assessment/Plan:  Chronic Problems Addressed Today: Chronic pain of both knees She has underlying osteoarthritis though has had worsening of pain in her left knee due to trauma this morning after striking it on a bedpost.  Given her Gilbert of pain in addition to instability with gait we will check x-ray to rule out fracture or any other abnormality.  We will start diclofenac for pain.  Advised her to keep the area with compression and ice.  May need referral to sports medicine or orthopedics depending on xray result.     Subjective:  HPI:  Patient here today as a work in visits. She is accompanied by her daughter.   She complains of falling and hurting her left knee from bed corner this morning. She states that she was walking fine after the fall. However she have trouble walking later and the pain is not bad when lay down. Associated symptoms are swelling. No pain or injury on the right knee. She states that pain wakes her up. No other injuries or trauma.  Sometimes feels like her knee is going to give out.         Objective:  Physical Exam: BP 119/80   Pulse (!) 56   Temp 98.1 F (36.7 C) (Temporal)   Ht 5\' 8"  (1.727 m)   Wt 185 lb (83.9 kg)   LMP  (LMP Unknown) Comment: Menopausal  SpO2 97%   BMI 28.13 kg/m   Gen: No acute distress, resting comfortably MSK:  - Left Knee: Gross effusion noted.  Tenderness to palpation along right joint line.  Antalgic gait noted. Neuro: Grossly normal, moves all extremities Psych: Normal affect and thought content       I,Julie Gilbert,acting as a scribe for , MD.,have documented all relevant documentation on the behalf of Julie Doe, MD,as directed by  Julie Doe, MD while in the presence of Julie Doe, MD.   I, Julie Doe, MD, have reviewed all documentation for this visit. The documentation on 04/22/21 for the exam, diagnosis,  procedures, and orders are all accurate and complete.  04/24/21. Julie Degree, MD 04/22/2021 10:43 AM

## 2021-04-23 ENCOUNTER — Other Ambulatory Visit: Payer: Self-pay

## 2021-04-23 ENCOUNTER — Ambulatory Visit (INDEPENDENT_AMBULATORY_CARE_PROVIDER_SITE_OTHER)
Admission: RE | Admit: 2021-04-23 | Discharge: 2021-04-23 | Disposition: A | Discharge status: Home / Self Care | Payer: Medicare Other | Source: Ambulatory Visit | Attending: Family Medicine | Admitting: Family Medicine

## 2021-04-23 ENCOUNTER — Ambulatory Visit
Admission: RE | Admit: 2021-04-23 | Discharge: 2021-04-23 | Disposition: A | Discharge status: Home / Self Care | Payer: Medicare Other | Source: Ambulatory Visit | Attending: Family Medicine | Admitting: Family Medicine

## 2021-04-23 DIAGNOSIS — M25562 Pain in left knee: Secondary | ICD-10-CM

## 2021-04-23 DIAGNOSIS — G8929 Other chronic pain: Secondary | ICD-10-CM

## 2021-04-23 DIAGNOSIS — Z1231 Encounter for screening mammogram for malignant neoplasm of breast: Secondary | ICD-10-CM | POA: Diagnosis not present

## 2021-04-23 DIAGNOSIS — M25561 Pain in right knee: Secondary | ICD-10-CM

## 2021-04-26 ENCOUNTER — Ambulatory Visit: Payer: Medicare Other

## 2021-04-26 NOTE — Progress Notes (Signed)
Please inform patient of the following:  X-ray shows arthritis.  No fracture.  Would like for her to let us know if symptoms or not improving.

## 2021-04-26 NOTE — Telephone Encounter (Signed)
Left message to call back  

## 2021-04-28 ENCOUNTER — Other Ambulatory Visit: Payer: Self-pay | Admitting: Family Medicine

## 2021-04-28 NOTE — Telephone Encounter (Signed)
    Pt called back, she said she already got medical advise for someone else and the nurse don't need to call her back

## 2021-05-10 ENCOUNTER — Telehealth: Payer: Self-pay

## 2021-05-10 DIAGNOSIS — G8929 Other chronic pain: Secondary | ICD-10-CM

## 2021-05-10 NOTE — Telephone Encounter (Signed)
Patient is calling in stating that she hasnt heard back from anyone about her knee x-ray. Wanting to know if we have received them or not.

## 2021-05-10 NOTE — Telephone Encounter (Signed)
Date of birth verified by patient  Xray results given,Pt verbalized understanding   X-ray shows arthritis.  No fracture.  Would like for her to let us know if symptoms or not improving.  Patient stated: Still with pain most time pain is #10 on pain scale  Please advise

## 2021-05-10 NOTE — Telephone Encounter (Signed)
Recommend sports med referral.  Julie Gilbert. [name], MD 05/10/2021 4:14 PM

## 2021-05-10 NOTE — Telephone Encounter (Signed)
Referral to sports med has been placed.

## 2021-05-11 NOTE — Progress Notes (Signed)
Cardiology Office Note:    Date:  05/13/2021   ID:  Julie Gilbert, DOB Jun 02, 1951, MRN [id number]  PCP:  Vivi Barrack, MD  Cardiologist:  Sinclair Grooms, MD   Referring MD: Vivi Barrack, MD   Chief Complaint  Patient presents with   Coronary Artery Disease   Follow-up    History of stroke/TIA   Irregular Heart Beat    Tachycardia palpitations     History of Present Illness:    Julie Gilbert is a 70 y.o. female with a hx of CAD, hypertension, type 2 diabetes, transient ischemic attack, hyperlipidemia, asthma and CKD stage III, transferring for cardiology care from Dr Severiano Gilbert in [place] [facility]. Other medical problems are CKD, DM II, hyperlipidemia, essential hypertension, TIA, and asthma.   The patient is accompanied by her daughter.  They are coming here from orthopedist office where she received bilateral knee steroid injections.  The patient states that amlodipine 5 mg that she had been taking for quite some time was causing her to feel tired, precipitating palpitations, migraine headaches, and overall weakness.  The daughter states that she decided to stop the medication approximately 2 weeks ago and has put her on natural therapies to try to help bring the blood pressure down.  This was done without medical supervision.  They have a blood pressure cuff at home but have not generally monitored blood pressure to determine what impact stopping amlodipine would have.  On check-in today the blood pressure by the nurses noted below.  On my evaluation the patient's right arm blood pressure was 190/102 mmHg and left arm blood pressure 180/100 mmHg.  Since the palpitations was a reason that amlodipine was stopped, I asked if palpitations that she was having have resolved off the medication.  They state that she is still having the palpitations but not as frequent or as prolonged.  Her stroke was evaluated prior to moving to [place].  They did not have  knowledge of etiology.  Also mention that imaging was unremarkable.  Atrial fibrillation was never mentioned as a particular problem.  Currently the palpitations that she experiences occur off and on.  They occur at least once or twice a week.  When the heart starts racing it can last up to 3 minutes.  When it occurs, she becomes short of breath, feels she needs to sit down, and does not feel well until it goes away.  The palpitations also associated with chest tightness.  Chest tightness starts after the heart begins racing.   Past Medical History:  Diagnosis Date   Anxiety 05/18/2018   Asthma    Chronic anemia 06/29/2016   Chronic pain of both knees 02/15/2019   CKD stage 3 due to type 2 diabetes mellitus ([place]) 05/18/2018   Coronary artery disease 10/01/2018   Diabetes mellitus without complication ([place])    Dyslipidemia associated with type 2 diabetes mellitus ([place]) 05/18/2018   Lab Results Component Value Date  CHOL 198 08/17/2018  HDL 62.40 08/17/2018  LDLCALC 113 (H) 08/17/2018  TRIG 113.0 08/17/2018  CHOLHDL 3 08/17/2018     Gastroesophageal reflux disease without esophagitis 06/18/2015   Herniated intervertebral disc of lumbar spine 05/01/2017   Hypertension    Hypertension associated with diabetes ([place]) 05/13/2015   IBS (irritable bowel syndrome)    Insomnia 05/13/2015   Migraine headache 05/13/2015   Moderate episode of recurrent major depressive disorder ([place]) 01/04/2017   Osteoarthritis of spine 02/03/2017  Transient ischemic attack (TIA) 06/28/2016   Type 2 diabetes mellitus with neurological complications ([place]) 04/10/9677   Lab Results Component Value Date  HGBA1C 5.9 08/17/2018  HGBA1C 5.8 01/04/2017  HGBA1C 6.4 (H) 06/29/2016  Lab Results Component Value Date  MICROALBUR 10.3 (H) 08/17/2018  LDLCALC 113 (H) 08/17/2018  CREATININE 1.56 (H) 08/17/2018      Past Surgical History:  Procedure Laterality Date   CESAREAN SECTION     EXPLORATORY LAPAROTOMY      Current  Medications: Current Meds  Medication Sig   albuterol (VENTOLIN HFA) 108 (90 Base) MCG/ACT inhaler TAKE 2 PUFFS BY MOUTH EVERY 6 HOURS AS NEEDED FOR WHEEZE OR SHORTNESS OF BREATH   Ascorbic Acid (VITAMIN C) 1000 MG tablet Take 1,000 mg by mouth daily.   aspirin 81 MG tablet Take 81 mg by mouth daily.   Azelastine-Fluticasone 137-50 MCG/ACT SUSP Place 1 spray into the nose every 12 (twelve) hours.   Blood Glucose Monitoring Suppl (ACCU-CHEK AVIVA PLUS) w/Device KIT Use as directed to check blood sugar once daily. E11.9   Blood Pressure Monitoring (BLOOD PRESSURE KIT) DEVI 1 application by Does not apply route daily. Use to check blood pressure daily   Calcium Carbonate-Vitamin D 600-125 MG-UNIT TABS Take 600 Units by mouth daily.   clonazePAM (KLONOPIN) 1 MG tablet TAKE 1 TABLET BY MOUTH 3 TIMES DAILY.   COD LIVER OIL PO Take 500 Units by mouth daily.   [employer] OLOPATADINE HCL 0.2 % SOLN APPLY TO AFFECTED AREA EVERY DAY   DEXILANT 60 MG capsule Take 1 capsule by mouth daily.   diclofenac (VOLTAREN) 75 MG EC tablet Take 1 tablet (75 mg total) by mouth 2 (two) times daily.   docusate sodium (COLACE) 100 MG capsule Take 1 capsule (100 mg total) by mouth 2 (two) times daily.   EMGALITY 120 MG/ML SOAJ Inject 120 ml monthly.   escitalopram (LEXAPRO) 10 MG tablet Take 1 tablet (10 mg total) by mouth daily.   Fish Oil-Cholecalciferol (FISH OIL + D3) 1000-1000 MG-UNIT CAPS Take 1 tablet by mouth daily.   glucose blood (ACCU-CHEK AVIVA PLUS) test strip Use as instructed to check blood sugar once daily. E11.9   Lancets (ACCU-CHEK SOFT TOUCH) lancets Use as instructed to check blood sugar once daily E11.9   losartan (COZAAR) 25 MG tablet Take 1 tablet (25 mg total) by mouth daily.   meclizine (ANTIVERT) 25 MG tablet Take 1 tablet (25 mg total) by mouth 3 (three) times daily.   Multiple Vitamin (MULTIVITAMIN WITH MINERALS) TABS tablet Take 1 tablet by mouth daily.   nitroGLYCERIN (NITROSTAT) 0.4 MG SL tablet  PLACE 1 TABLET (0.4 MG TOTAL) UNDER THE TONGUE EVERY 5 (FIVE) MINUTES AS NEEDED FOR CHEST PAIN.   OVER THE COUNTER MEDICATION 6.25 mg daily. [place]   predniSONE (DELTASONE) 20 MG tablet Take 20 mg by mouth every morning.   promethazine (PHENERGAN) 12.5 MG tablet Take 1 tablet (12.5 mg total) by mouth every 8 (eight) hours as needed for nausea or vomiting.   Riboflavin (VITAMIN B-2 PO) Take 250 mg by mouth daily.   SYMBICORT 160-4.5 MCG/ACT inhaler TAKE 2 PUFFS BY MOUTH TWICE A DAY   topiramate (TOPAMAX) 50 MG tablet Take 1 tablet (50 mg total) by mouth 2 (two) times daily.   vitamin B-12 (CYANOCOBALAMIN) 100 MCG tablet Take 100 mcg by mouth daily.     Allergies:   Peanuts [peanut oil], Shrimp [shellfish allergy], Latex, Elemental sulfur, Penicillins, Pineapple, and Sulfa antibiotics   Social  History   Socioeconomic History   Marital status: Divorced    Spouse name: Not on file   Number of children: Not on file   Years of education: Not on file   Highest education level: Not on file  Occupational History   Not on file  Tobacco Use   Smoking status: Former    Types: Cigarettes    Quit date: 09/22/2012    Years since quitting: 8.6   Smokeless tobacco: Never  Vaping Use   Vaping Use: Never used  Substance and Sexual Activity   Alcohol use: Yes    Alcohol/week: 1.0 standard drink    Types: 1 Glasses of wine per week    Comment: red wine 1-2 x month   Drug use: No   Sexual activity: Not Currently  Other Topics Concern   Not on file  Social History Narrative   Lives with daughter Julie Gilbert)    Social Determinants of Health   Financial Resource Strain: Not on file  Food Insecurity: Not on file  Transportation Needs: Not on file  Physical Activity: Not on file  Stress: Not on file  Social Connections: Not on file     Family History: The patient's family history includes Cancer in her maternal grandfather; Heart disease in her father; Hypertension in her mother; Kidney  failure in her mother. There is no history of Breast cancer.  ROS:   Please see the history of present illness.    She is very anxious about having another stroke.  She commented that she is having a hard time swallowing and is worried that it may cause her to have a stroke.  All other systems reviewed and are negative.  EKGs/Labs/Other Studies Reviewed:    The following studies were reviewed today: No new or recent cardiac data/imaging.  EKG:  EKG sinus bradycardia with anterior T wave abnormality also inferior T wave abnormality.  Left atrial abnormality is noted as well.  When compared to the tracing from February 2021, the heart rate on the current EKG is slightly faster.  Recent Labs: 02/11/2021: ALT 8; BUN 18; Creatinine, Ser 1.83; Hemoglobin 12.3; Platelets 180.0; Potassium 3.7; Sodium 141; TSH 2.48  Recent Lipid Panel    Component Value Date/Time   CHOL 355 (H) 02/11/2021 1459   CHOL 160 06/30/2017 1054   TRIG 189.0 (H) 02/11/2021 1459   HDL 71.30 02/11/2021 1459   HDL 49 06/30/2017 1054   CHOLHDL 5 02/11/2021 1459   VLDL 37.8 02/11/2021 1459   LDLCALC 246 (H) 02/11/2021 1459   LDLCALC 94 06/30/2017 1054    Physical Exam:    VS:  BP 132/90   Pulse (!) 57   Ht _0  (1.702 m)   Wt 184 lb 6.4 oz (83.6 kg)   LMP  (LMP Unknown) Comment: Menopausal  SpO2 97%   BMI 28.88 kg/m     Wt Readings from Last 3 Encounters:  05/13/21 184 lb 6.4 oz (83.6 kg)  05/13/21 185 lb (83.9 kg)  04/22/21 185 lb (83.9 kg)     GEN: Appears frustrated or anxious, difficult for me to tell.. No acute distress HEENT: Normal NECK: No JVD. LYMPHATICS: No lymphadenopathy CARDIAC: No murmur. RRR S4 gallop is present but without  edema. VASCULAR:  Normal Pulses. No bruits. RESPIRATORY:  Clear to auscultation without rales, wheezing or rhonchi  ABDOMEN: Soft, non-tender, non-distended, No pulsatile mass, MUSCULOSKELETAL: No deformity  SKIN: Warm and dry NEUROLOGIC:  Alert and oriented x  3 PSYCHIATRIC:  Normal affect   ASSESSMENT:    1. Coronary artery disease involving native coronary artery of native heart without angina pectoris   2. Hypertension associated with diabetes ([place])   3. Type 2 diabetes mellitus with neurological complications ([place])   4. Dyslipidemia associated with type 2 diabetes mellitus ([place])   5. CKD stage 3 due to type 2 diabetes mellitus ([facility])   6. Palpitations    PLAN:    In order of problems listed above:  Secondary prevention discussed especially the importance of excellent blood pressure control. I recommended that they measure blood pressure daily and after several days but no later than 2 weeks to call and the results of routine blood pressure monitoring.  Target blood pressure given her risk profile is 130/80 mmHg or less. Being controlled by primary care. Not taking statin.  This is a medication that is also been discontinued without medical advice. Current renal profile is not known.  Most recent creatinine in May was 1.8, consistent with stage IIIb CKD. A 30-day monitor will be done to rule out atrial fibrillation.    Overall, I am concerned about her prognosis.  The blood pressure is not well controlled.  Medications that decrease her risk have been discontinued without medical advice.  We will make sure she is not having atrial fibrillation.  We will monitor the blood pressures and make adjustments as indicated based upon data.  The blood pressure may be somewhat elevated this afternoon as she has just received bilateral knee steroid injections earlier today.   Medication Adjustments/Labs and Tests Ordered: Current medicines are reviewed at length with the patient today.  Concerns regarding medicines are outlined above.  Orders Placed This Encounter  Procedures   Cardiac event monitor   EKG 12-Lead    No orders of the defined types were placed in this encounter.   Patient Instructions  Medication Instructions:  Your physician  recommends that you continue on your current medications as directed. Please refer to the Current Medication list given to you today.  *If you need a refill on your cardiac medications before your next appointment, please call your pharmacy*   Lab Work: None If you have labs (blood work) drawn today and your tests are completely normal, you will receive your results only by: [place] (if you have MyChart) OR A paper copy in the mail If you have any lab test that is abnormal or we need to change your treatment, we will call you to review the results.   Testing/Procedures: Your physician has recommended that you wear an event monitor. Event monitors are medical devices that record the heart's electrical activity. Doctors most often [place] these monitors to diagnose arrhythmias. Arrhythmias are problems with the speed or rhythm of the heartbeat. The monitor is a small, portable device. You can wear one while you do your normal daily activities. This is usually used to diagnose what is causing palpitations/syncope (passing out).   Follow-Up: At [facility], you and your health needs are our priority.  As part of our continuing mission to provide you with exceptional heart care, we have created designated Provider Care Teams.  These Care Teams include your primary Cardiologist (physician) and Advanced Practice Providers (APPs -  Physician Assistants and Nurse Practitioners) who all work together to provide you with the care you need, when you need it.  We recommend signing up for the patient portal called "MyChart".  Sign up information is provided on this After Visit Summary.  MyChart  is used to connect with patients for Virtual Visits (Telemedicine).  Patients are able to view lab/test results, encounter notes, upcoming appointments, etc.  Non-urgent messages can be sent to your provider as well.   To learn more about what you can do with MyChart, go to [email or web address].    Your next  appointment:   3 month(s)  The format for your next appointment:   In Person  Provider:   You may see Sinclair Grooms, MD or one of the following Advanced Practice Providers on your designated Care Team:   Cecilie Kicks, NP   Other Instructions  [employer] Cardiac Event Monitor Instructions Your physician has requested you wear your cardiac event monitor for _____ days, (1-30). [employer] may call or text to confirm a shipping address. The monitor will be sent to a land address via UPS. [employer] will not ship a monitor to a PO BOX. It typically takes 3-5 days to receive your monitor after it has been enrolled. [employer] will assist with USPS tracking if your package is delayed. The telephone number for [employer] is [phone]. Once you have received your monitor, please review the enclosed instructions. Instruction tutorials can also be viewed under help and settings on the enclosed cell phone. Your monitor has already been registered assigning a specific monitor serial # to you.  Applying the monitor Remove cell phone from case and turn it on. The cell phone works as Dealer and needs to be within [employer] of you at all times. The cell phone will need to be charged on a daily basis. We recommend you plug the cell phone into the enclosed charger at your bedside table every night.  Monitor batteries: You will receive two monitor batteries labelled #1 and #2. These are your recorders. Plug battery #2 onto the second connection on the enclosed charger. Keep one battery on the charger at all times. This will keep the monitor battery deactivated. It will also keep it fully charged for when you need to switch your monitor batteries. A small light will be blinking on the battery emblem when it is charging. The light on the battery emblem will remain on when the battery is fully charged.  Open package of a Monitor strip. Insert battery #1 into black hood on strip and gently  squeeze monitor battery onto connection as indicated in instruction booklet. Set aside while preparing skin.  Choose location for your strip, vertical or horizontal, as indicated in the instruction booklet. Shave to remove all hair from location. There cannot be any lotions, oils, powders, or colognes on skin where monitor is to be applied. Wipe skin clean with enclosed Saline wipe. Dry skin completely.  Peel paper labeled #1 off the back of the Monitor strip exposing the adhesive. Place the monitor on the chest in the vertical or horizontal position shown in the instruction booklet. One arrow on the monitor strip must be pointing upward. Carefully remove paper labeled #2, attaching remainder of strip to your skin. Try not to create any folds or wrinkles in the strip as you apply it.  Firmly press and release the circle in the center of the monitor battery. You will hear a small beep. This is turning the monitor battery on. The heart emblem on the monitor battery will light up every 5 seconds if the monitor battery in turned on and connected to the patient securely. Do not push and hold the circle down as this turns the monitor battery off. The cell phone  will locate the monitor battery. A screen will appear on the cell phone checking the connection of your monitor strip. This may read poor connection initially but change to good connection within the next minute. Once your monitor accepts the connection you will hear a series of 3 beeps followed by a climbing crescendo of beeps. A screen will appear on the cell phone showing the two monitor strip placement options. Touch the picture that demonstrates where you applied the monitor strip.  Your monitor strip and battery are waterproof. You are able to shower, bathe, or swim with the monitor on. They just ask you do not submerge deeper than 3 feet underwater. We recommend removing the monitor if you are swimming in a lake, river, or  ocean.  Your monitor battery will need to be switched to a fully charged monitor battery approximately once a week. The cell phone will alert you of an action which needs to be made.  On the cell phone, tap for details to reveal connection status, monitor battery status, and cell phone battery status. The green dots indicates your monitor is in good status. A red dot indicates there is something that needs your attention.  To record a symptom, click the circle on the monitor battery. In 30-60 seconds a list of symptoms will appear on the cell phone. Select your symptom and tap save. Your monitor will record a sustained or significant arrhythmia regardless of you clicking the button. Some patients do not feel the heart rhythm irregularities. [employer] will notify us of any serious or critical events.  Refer to instruction booklet for instructions on switching batteries, changing strips, the Do not disturb or Pause features, or any additional questions.  Call [employer] at [phone], to confirm your monitor is transmitting and record your baseline. They will answer any questions you may have regarding the monitor instructions at that time.  Returning the monitor to [facility] all equipment back into blue box. Peel off strip of paper to expose adhesive and close box securely. There is a prepaid UPS shipping label on this box. Drop in a UPS drop box, or at a UPS facility like [employer]. You may also contact [employer] to arrange UPS to pick up monitor package at your home.   Signed, Sinclair Grooms, MD  05/13/2021 5:49 PM    [place]

## 2021-05-13 ENCOUNTER — Encounter: Payer: Self-pay | Admitting: Interventional Cardiology

## 2021-05-13 ENCOUNTER — Ambulatory Visit: Payer: Self-pay

## 2021-05-13 ENCOUNTER — Encounter: Payer: Self-pay | Admitting: Radiology

## 2021-05-13 ENCOUNTER — Encounter: Payer: Self-pay | Admitting: Family Medicine

## 2021-05-13 ENCOUNTER — Ambulatory Visit (INDEPENDENT_AMBULATORY_CARE_PROVIDER_SITE_OTHER): Payer: Medicare Other | Admitting: Interventional Cardiology

## 2021-05-13 ENCOUNTER — Ambulatory Visit (INDEPENDENT_AMBULATORY_CARE_PROVIDER_SITE_OTHER): Payer: Medicare Other | Admitting: Family Medicine

## 2021-05-13 ENCOUNTER — Other Ambulatory Visit: Payer: Self-pay

## 2021-05-13 VITALS — BP 136/88 | HR 59 | Ht 68.0 in | Wt 185.0 lb

## 2021-05-13 VITALS — BP 132/90 | HR 57 | Ht 67.0 in | Wt 184.4 lb

## 2021-05-13 DIAGNOSIS — E1169 Type 2 diabetes mellitus with other specified complication: Secondary | ICD-10-CM | POA: Diagnosis not present

## 2021-05-13 DIAGNOSIS — E1159 Type 2 diabetes mellitus with other circulatory complications: Secondary | ICD-10-CM

## 2021-05-13 DIAGNOSIS — M25561 Pain in right knee: Secondary | ICD-10-CM

## 2021-05-13 DIAGNOSIS — N183 Chronic kidney disease, stage 3 unspecified: Secondary | ICD-10-CM | POA: Diagnosis not present

## 2021-05-13 DIAGNOSIS — R002 Palpitations: Secondary | ICD-10-CM | POA: Diagnosis not present

## 2021-05-13 DIAGNOSIS — E785 Hyperlipidemia, unspecified: Secondary | ICD-10-CM

## 2021-05-13 DIAGNOSIS — M25562 Pain in left knee: Secondary | ICD-10-CM | POA: Diagnosis not present

## 2021-05-13 DIAGNOSIS — G8929 Other chronic pain: Secondary | ICD-10-CM | POA: Diagnosis not present

## 2021-05-13 DIAGNOSIS — E1149 Type 2 diabetes mellitus with other diabetic neurological complication: Secondary | ICD-10-CM

## 2021-05-13 DIAGNOSIS — I2510 Atherosclerotic heart disease of native coronary artery without angina pectoris: Secondary | ICD-10-CM | POA: Diagnosis not present

## 2021-05-13 DIAGNOSIS — E1122 Type 2 diabetes mellitus with diabetic chronic kidney disease: Secondary | ICD-10-CM

## 2021-05-13 DIAGNOSIS — I152 Hypertension secondary to endocrine disorders: Secondary | ICD-10-CM | POA: Diagnosis not present

## 2021-05-13 NOTE — Patient Instructions (Signed)
Medication Instructions:  Your physician recommends that you continue on your current medications as directed. Please refer to the Current Medication list given to you today.  *If you need a refill on your cardiac medications before your next appointment, please call your pharmacy*   Lab Work: None If you have labs (blood work) drawn today and your tests are completely normal, you will receive your results only by: MyChart Message (if you have MyChart) OR A paper copy in the mail If you have any lab test that is abnormal or we need to change your treatment, we will call you to review the results.   Testing/Procedures: Your physician has recommended that you wear an event monitor. Event monitors are medical devices that record the heart's electrical activity. Doctors most often [place] these monitors to diagnose arrhythmias. Arrhythmias are problems with the speed or rhythm of the heartbeat. The monitor is a small, portable device. You can wear one while you do your normal daily activities. This is usually used to diagnose what is causing palpitations/syncope (passing out).   Follow-Up: At [facility], you and your health needs are our priority.  As part of our continuing mission to provide you with exceptional heart care, we have created designated Provider Care Teams.  These Care Teams include your primary Cardiologist (physician) and Advanced Practice Providers (APPs -  Physician Assistants and Nurse Practitioners) who all work together to provide you with the care you need, when you need it.  We recommend signing up for the patient portal called "MyChart".  Sign up information is provided on this After Visit Summary.  MyChart is used to connect with patients for Virtual Visits (Telemedicine).  Patients are able to view lab/test results, encounter notes, upcoming appointments, etc.  Non-urgent messages can be sent to your provider as well.   To learn more about what you can do with MyChart, go  to [email or web address].    Your next appointment:   3 month(s)  The format for your next appointment:   In Person  Provider:   You may see Lesleigh Noe, MD or one of the following Advanced Practice Providers on your designated Care Team:   Nada Boozer, NP   Other Instructions  [employer] Cardiac Event Monitor Instructions Your physician has requested you wear your cardiac event monitor for _____ days, (1-30). [employer] may call or text to confirm a shipping address. The monitor will be sent to a land address via UPS. [employer] will not ship a monitor to a PO BOX. It typically takes 3-5 days to receive your monitor after it has been enrolled. [employer] will assist with USPS tracking if your package is delayed. The telephone number for [employer] is [phone]. Once you have received your monitor, please review the enclosed instructions. Instruction tutorials can also be viewed under help and settings on the enclosed cell phone. Your monitor has already been registered assigning a specific monitor serial # to you.  Applying the monitor Remove cell phone from case and turn it on. The cell phone works as IT consultant and needs to be within [employer] of you at all times. The cell phone will need to be charged on a daily basis. We recommend you plug the cell phone into the enclosed charger at your bedside table every night.  Monitor batteries: You will receive two monitor batteries labelled #1 and #2. These are your recorders. Plug battery #2 onto the second connection on the enclosed charger. Keep one battery on the charger  at all times. This will keep the monitor battery deactivated. It will also keep it fully charged for when you need to switch your monitor batteries. A small light will be blinking on the battery emblem when it is charging. The light on the battery emblem will remain on when the battery is fully charged.  Open package of a Monitor strip. Insert  battery #1 into black hood on strip and gently squeeze monitor battery onto connection as indicated in instruction booklet. Set aside while preparing skin.  Choose location for your strip, vertical or horizontal, as indicated in the instruction booklet. Shave to remove all hair from location. There cannot be any lotions, oils, powders, or colognes on skin where monitor is to be applied. Wipe skin clean with enclosed Saline wipe. Dry skin completely.  Peel paper labeled #1 off the back of the Monitor strip exposing the adhesive. Place the monitor on the chest in the vertical or horizontal position shown in the instruction booklet. One arrow on the monitor strip must be pointing upward. Carefully remove paper labeled #2, attaching remainder of strip to your skin. Try not to create any folds or wrinkles in the strip as you apply it.  Firmly press and release the circle in the center of the monitor battery. You will hear a small beep. This is turning the monitor battery on. The heart emblem on the monitor battery will light up every 5 seconds if the monitor battery in turned on and connected to the patient securely. Do not push and hold the circle down as this turns the monitor battery off. The cell phone will locate the monitor battery. A screen will appear on the cell phone checking the connection of your monitor strip. This may read poor connection initially but change to good connection within the next minute. Once your monitor accepts the connection you will hear a series of 3 beeps followed by a climbing crescendo of beeps. A screen will appear on the cell phone showing the two monitor strip placement options. Touch the picture that demonstrates where you applied the monitor strip.  Your monitor strip and battery are waterproof. You are able to shower, bathe, or swim with the monitor on. They just ask you do not submerge deeper than 3 feet underwater. We recommend removing the monitor if you  are swimming in a lake, river, or ocean.  Your monitor battery will need to be switched to a fully charged monitor battery approximately once a week. The cell phone will alert you of an action which needs to be made.  On the cell phone, tap for details to reveal connection status, monitor battery status, and cell phone battery status. The green dots indicates your monitor is in good status. A red dot indicates there is something that needs your attention.  To record a symptom, click the circle on the monitor battery. In 30-60 seconds a list of symptoms will appear on the cell phone. Select your symptom and tap save. Your monitor will record a sustained or significant arrhythmia regardless of you clicking the button. Some patients do not feel the heart rhythm irregularities. [employer] will notify us of any serious or critical events.  Refer to instruction booklet for instructions on switching batteries, changing strips, the Do not disturb or Pause features, or any additional questions.  Call [employer] at [phone], to confirm your monitor is transmitting and record your baseline. They will answer any questions you may have regarding the monitor instructions at that time.  Returning the monitor to [facility] all equipment back into blue box. Peel off strip of paper to expose adhesive and close box securely. There is a prepaid UPS shipping label on this box. Drop in a UPS drop box, or at a UPS facility like [employer]. You may also contact [employer] to arrange UPS to pick up monitor package at your home.

## 2021-05-13 NOTE — Progress Notes (Signed)
Enrolled patient for a 30 day [employer] Event Monitor to be mailed to patients home  

## 2021-05-13 NOTE — Patient Instructions (Signed)
Thank you for coming in today.   Call or go to the ER if you develop a large red swollen joint with extreme pain or oozing puss.    If not doing well I would recommend PT.   If you would like to see Hessie Diener let me know.   I can do the cortisone shots every 3 months.   If they dont last I can try the gel shots.  I will need a little advanced warning so I can get them approved.

## 2021-05-13 NOTE — Progress Notes (Signed)
I, Christoper Fabian, LAT, ATC, am serving as scribe for Dr. [name].   Subjective:    I'm seeing this patient as a consultation for: Dr. [name]. Note will be routed back to referring provider/PCP.  CC: Bilat knee pain  HPI: Pt is a 70 y/o female c/o B chronic knee pain w/ exacerbated L knee pain since 8/15 due to striking it on a bedpost. Pt locates pain to her B ant knees.  Swelling: yes Mechanical symptoms: yes Aggravates: walking; climbing stairs; transitioning from sit-to-stand Treatments tried: ice, compression, diclofenac; prior L knee injection  Dx imaging: 04/23/21 L knee XR  Past medical history, Surgical history, Family history, Social history, Allergies, and medications have been entered into the medical record, reviewed.   Review of Systems: No new headache, visual changes, nausea, vomiting, diarrhea, constipation, dizziness, abdominal pain, skin rash, fevers, chills, night sweats, weight loss, swollen lymph nodes, body aches, joint swelling, muscle aches, chest pain, shortness of breath, mood changes, visual or auditory hallucinations.   Objective:    Vitals:   05/13/21 1344  BP: 136/88  Pulse: (!) 59  SpO2: 98%   General: Well Developed, well nourished, and in no acute distress.  Neuro/Psych: Alert and oriented x3, extra-ocular muscles intact, able to move all 4 extremities, sensation grossly intact. Skin: Warm and dry, no rashes noted.  Respiratory: Not using accessory muscles, speaking in full sentences, trachea midline.  Cardiovascular: Pulses palpable, no extremity edema. Abdomen: Does not appear distended. MSK:  Right knee normal-appearing Normal motion with crepitation.  Mildly tender palpation medial joint line. Stable ligamentous exam. Intact strength.  Left knee normal-appearing Normal motion with crepitation. Mildly tender palpation anterior knee and medial joint line. Stable ligamentous exam. Intact strength.  Lab and Radiology  Results  Procedure: Real-time Ultrasound Guided Injection of right knee superior lateral patellar space Device: Philips Affiniti 50G Images permanently stored and available for review in PACS Verbal informed consent obtained.  Discussed risks and benefits of procedure. Warned about infection bleeding damage to structures skin hypopigmentation and fat atrophy among others. Patient expresses understanding and agreement Time-out conducted.   Noted no overlying erythema, induration, or other signs of local infection.   Skin prepped in a sterile fashion.   Local anesthesia: Topical Ethyl chloride.   With sterile technique and under real time ultrasound guidance: 40 mg of Kenalog and 2 ml of Marcaine injected into knee joint. Fluid seen entering the joint capsule.   Completed without difficulty   Pain immediately resolved suggesting accurate placement of the medication.   Advised to call if fevers/chills, erythema, induration, drainage, or persistent bleeding.   Images permanently stored and available for review in the ultrasound unit.  Impression: Technically successful ultrasound guided injection.    Procedure: Real-time Ultrasound Guided Injection of left knee superior lateral patellar space Device: Philips Affiniti 50G Images permanently stored and available for review in PACS Verbal informed consent obtained.  Discussed risks and benefits of procedure. Warned about infection bleeding damage to structures skin hypopigmentation and fat atrophy among others. Patient expresses understanding and agreement Time-out conducted.   Noted no overlying erythema, induration, or other signs of local infection.   Skin prepped in a sterile fashion.   Local anesthesia: Topical Ethyl chloride.   With sterile technique and under real time ultrasound guidance: 40 mg of Kenalog and 2 mL of Marcaine injected into knee joint. Fluid seen entering the joint capsule.   Completed without difficulty   Pain  immediately resolved suggesting  accurate placement of the medication.   Advised to call if fevers/chills, erythema, induration, drainage, or persistent bleeding.   Images permanently stored and available for review in the ultrasound unit.  Impression: Technically successful ultrasound guided injection.   EXAM: LEFT KNEE - 1-2 VIEW   COMPARISON:  None.   FINDINGS: No joint effusion or fracture. Medial and patellofemoral compartment osteophytosis. Mild peaking of the tibial spines.   IMPRESSION: 1. No acute findings. 2. Mild degenerative changes.     Electronically Signed   By: Leanna Battles M.D.   On: 04/26/2021 14:12 I, [name], personally (independently) visualized and performed the interpretation of the images attached in this note.    Impression and Recommendations:    Assessment and Plan: 70 y.o. 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  Plan for steroid injections bilaterally.  She is already tried Voltaren gel which has not helped and cannot tolerate oral NSAIDs very well.  She is an excellent candidate for trial of physical therapy but would like to avoid that for now.  If needed would refer to [facility] as she would like to see Zebedee Iba PT. can repeat steroid injections every 3 months and certainly could do hyaluronic acid injections in the future if needed.  Recheck back as needed.  PDMP not reviewed this encounter. Orders Placed This Encounter  Procedures   [place] LIMITED JOINT SPACE STRUCTURES LOW BILAT(NO LINKED CHARGES)    Order Specific Question:   Reason for Exam (SYMPTOM  OR DIAGNOSIS REQUIRED)    Answer:   B knee pain    Order Specific Question:   Preferred imaging location?    Answer:   [facility]   No orders of the defined types were placed in this encounter.   Discussed warning signs or symptoms. Please see discharge instructions. Patient expresses understanding.   The above  documentation has been reviewed and is accurate and complete [name], M.D.

## 2021-05-14 ENCOUNTER — Other Ambulatory Visit: Payer: Self-pay | Admitting: Family Medicine

## 2021-05-20 ENCOUNTER — Ambulatory Visit (INDEPENDENT_AMBULATORY_CARE_PROVIDER_SITE_OTHER): Payer: Medicare Other | Admitting: Neurology

## 2021-05-20 ENCOUNTER — Encounter: Payer: Self-pay | Admitting: Neurology

## 2021-05-20 ENCOUNTER — Other Ambulatory Visit: Payer: Self-pay | Admitting: Neurology

## 2021-05-20 ENCOUNTER — Ambulatory Visit (INDEPENDENT_AMBULATORY_CARE_PROVIDER_SITE_OTHER): Payer: Medicare Other

## 2021-05-20 VITALS — BP 132/85 | HR 56 | Ht 66.0 in | Wt 182.0 lb

## 2021-05-20 DIAGNOSIS — G43709 Chronic migraine without aura, not intractable, without status migrainosus: Secondary | ICD-10-CM | POA: Diagnosis not present

## 2021-05-20 DIAGNOSIS — G43909 Migraine, unspecified, not intractable, without status migrainosus: Secondary | ICD-10-CM

## 2021-05-20 DIAGNOSIS — G250 Essential tremor: Secondary | ICD-10-CM | POA: Diagnosis not present

## 2021-05-20 DIAGNOSIS — R002 Palpitations: Secondary | ICD-10-CM

## 2021-05-20 MED ORDER — TOPIRAMATE 50 MG PO TABS: 50.0000 mg | ORAL_TABLET | Freq: Two times a day (BID) | ORAL | 4 refills | Status: DC

## 2021-05-20 MED ORDER — EMGALITY 120 MG/ML ~~LOC~~ SOAJ: SUBCUTANEOUS | 11 refills | Status: DC

## 2021-05-20 MED ORDER — UBRELVY 100 MG PO TABS: 100.0000 mg | ORAL_TABLET | ORAL | 11 refills | Status: DC | PRN

## 2021-05-20 NOTE — Patient Instructions (Addendum)
Prevention: Continue Emgality and Topamax Actue/Emergency: [name] Consider botox: call us F/u 4 months  Essential Tremor A tremor is trembling or shaking that a person cannot control. Most tremors affect the hands or arms. Tremors can also affect the head, vocal cords, legs, and other parts of the body. Essential tremor is a tremor without a known cause. Usually, it occurs while a person is trying to perform an action. Ittends to get worse gradually as a person ages. What are the causes? The cause of this condition is not known. What increases the risk? You are more likely to develop this condition if: You have a family member with essential tremor. You are age 56 or older. You take certain medicines. What are the signs or symptoms? The main sign of a tremor is a rhythmic shaking of certain parts of your body that is uncontrolled and unintentional. You may: Have difficulty eating with a spoon or fork. Have difficulty writing. Nod your head up and down or side to side. Have a quivering voice. The shaking may: Get worse over time. Come and go. Be more noticeable on one side of your body. Get worse due to stress, fatigue, caffeine, and extreme heat or cold. How is this diagnosed? This condition may be diagnosed based on: Your symptoms and medical history. A physical exam. There is no single test to diagnose an essential tremor. However, your health care provider may order tests to rule out other causes of your condition. These may include: Blood and urine tests. Imaging studies of your brain, such as CT scan and MRI. A test that measures involuntary muscle movement (electromyogram). How is this treated? Treatment for essential tremor depends on the severity of the condition. Some tremors may go away without treatment. Mild tremors may not need treatment if they do not affect your day-to-day life. Severe tremors may need to be treated using one or more of the following  options: Medicines. Lifestyle changes. Occupational or physical therapy. Follow these instructions at home: Lifestyle  Do not use any products that contain nicotine or tobacco, such as cigarettes and e-cigarettes. If you need help quitting, ask your health care provider. Limit your caffeine intake as told by your health care provider. Try to get 8 hours of sleep each night. Find ways to manage your stress that fits your lifestyle and personality. Consider trying meditation or yoga. Try to anticipate stressful situations and allow extra time to manage them. If you are struggling emotionally with the effects of your tremor, consider working with a mental health provider.  General instructions Take over-the-counter and prescription medicines only as told by your health care provider. Avoid extreme heat and extreme cold. Keep all follow-up visits as told by your health care provider. This is important. Visits may include physical therapy visits. Contact a health care provider if: You experience any changes in the location or intensity of your tremors. You start having a tremor after starting a new medicine. You have tremor with other symptoms, such as: Numbness. Tingling. Pain. Weakness. Your tremor gets worse. Your tremor interferes with your daily life. You feel down, blue, or sad for at least 2 weeks in a row. Worrying about your tremor and what other people think about you interferes with your everyday life functions, including relationships, work, or school. Summary Essential tremor is a tremor without a known cause. Usually, it occurs when you are trying to perform an action. You are more likely to develop this condition if you have a family  member with essential tremor. The main sign of a tremor is a rhythmic shaking of certain parts of your body that is uncontrolled and unintentional. Treatment for essential tremor depends on the severity of the condition. This information is  not intended to replace advice given to you by your health care provider. Make sure you discuss any questions you have with your healthcare provider. Document Revised: 06/05/2020 Document Reviewed: 06/05/2020 Elsevier Patient Education  2022 [employer] Ubrogepant tablets What is this medication? UBROGEPANT (ue BROE je pant) is used to treat migraine headaches with or without aura. An aura is a strange feeling or visual disturbance that warns you of an attack. It is not used to prevent migraines. This medicine may be used for other purposes; ask your health care provider or pharmacist if you have questions. COMMON BRAND NAME(S): Bernita RaisinUbrelvy What should I tell my care team before I take this medication? They need to know if you have any of these conditions: kidney disease liver disease an unusual or allergic reaction to ubrogepant, other medicines, foods, dyes, or preservatives pregnant or trying to get pregnant breast-feeding How should I use this medication? Take this medicine by mouth with a glass of water. Follow the directions on the prescription label. You can take it with or without food. If it upsets your stomach, take it with food. Take your medicine at regular intervals. Do not take it more often than directed. Do not stop taking except on your doctor's advice. Talk to your pediatrician about the use of this medicine in children. Special care may be needed. Overdosage: If you think you have taken too much of this medicine contact a poison control center or emergency room at once. NOTE: This medicine is only for you. Do not share this medicine with others. What if I miss a dose? This does not apply. This medicine is not for regular use. What may interact with this medication? Do not take this medicine with any of the following medicines: ceritinib certain antibiotics like chloramphenicol, clarithromycin, telithromycin certain antivirals for HIV like atazanavir, cobicistat, darunavir,  delavirdine, fosamprenavir, indinavir, ritonavir certain medicines for fungal infections like itraconazole, ketoconazole, posaconazole, voriconazole conivaptan grapefruit idelalisib mifepristone nefazodone ribociclib This medicine may also interact with the following medications: carvedilol certain medicines for seizures like phenobarbital, phenytoin ciprofloxacin cyclosporine eltrombopag fluconazole fluvoxamine quinidine rifampin [facility]. John's wort verapamil This list may not describe all possible interactions. Give your health care provider a list of all the medicines, herbs, non-prescription drugs, or dietary supplements you use. Also tell them if you smoke, drink alcohol, or use illegal drugs. Some items may interact with your medicine. What should I watch for while using this medication? Visit your health care professional for regular checks on your progress. Tell your health care professional if your symptoms do not start to get better or if they get worse. Your mouth may get dry. Chewing sugarless gum or sucking hard candy and drinking plenty of water may help. Contact your health care professional if the problem does not go away or is severe. What side effects may I notice from receiving this medication? Side effects that you should report to your doctor or health care professional as soon as possible: allergic reactions like skin rash, itching or hives; swelling of the face, lips, or tongue Side effects that usually do not require medical attention (report these to your doctor or health care professional if they continue or are bothersome): drowsiness dry mouth nausea tiredness This list may not describe  all possible side effects. Call your doctor for medical advice about side effects. You may report side effects to FDA at 1-800-FDA-1088. Where should I keep my medication? Keep out of the reach of children. Store at room temperature between 15 and 30 degrees C (59 and 86  degrees F). Throw away any unused medicine after the expiration date. NOTE: This sheet is a summary. It may not cover all possible information. If you have questions about this medicine, talk to your doctor, pharmacist, or health care provider.  2022 Elsevier/Gold Standard (2018-11-29 08:50:55) OnabotulinumtoxinA injection (Medical Use) What is this medication? ONABOTULINUMTOXINA (o na BOTT you lye num tox in eh) is a neuro-muscular blocker. This medicine is used to treat crossed eyes, eyelid spasms, severe neck muscle spasms, ankle and toe muscle spasms, and elbow, wrist, and finger muscle spasms. It is also used to treat excessive underarm sweating, to prevent chronic migraine headaches, and to treat loss of bladder control due toneurologic conditions such as multiple sclerosis or spinal cord injury. This medicine may be used for other purposes; ask your health care provider orpharmacist if you have questions. COMMON BRAND NAME(S): Botox What should I tell my care team before I take this medication? They need to know if you have any of these conditions: breathing problems cerebral palsy spasms difficulty urinating heart problems history of surgery where this medicine is going to be used infection at the site where this medicine is going to be used myasthenia gravis or other neurologic disease nerve or muscle disease surgery plans take medicines that treat or prevent blood clots thyroid problems an unusual or allergic reaction to botulinum toxin, albumin, other medicines, foods, dyes, or preservatives pregnant or trying to get pregnant breast-feeding How should I use this medication? This medicine is for injection into a muscle. It is given by a health careprofessional in a hospital or clinic setting. Talk to your pediatrician regarding the use of this medicine in children. While this drug may be prescribed for children as young as 11 years old for selectedconditions, precautions do  apply. Overdosage: If you think you have taken too much of this medicine contact apoison control center or emergency room at once. NOTE: This medicine is only for you. Do not share this medicine with others. What if I miss a dose? This does not apply. What may interact with this medication? aminoglycoside antibiotics like gentamicin, neomycin, tobramycin muscle relaxants other botulinum toxin injections This list may not describe all possible interactions. Give your health care provider a list of all the medicines, herbs, non-prescription drugs, or dietary supplements you use. Also tell them if you smoke, drink alcohol, or use illegaldrugs. Some items may interact with your medicine. What should I watch for while using this medication? Visit your doctor for regular check ups. This medicine will cause weakness in the muscle where it is injected. Tell your doctor if you feel unusually weak in other muscles. Get medical help right awayif you have problems with breathing, swallowing, or talking. This medicine might make your eyelids droop or make you see blurry or double. If you have weak muscles or trouble seeing do not drive a car, use machinery,or do other dangerous activities. This medicine contains albumin from human blood. It may be possible to pass an infection in this medicine, but no cases have been reported. Talk to yourdoctor about the risks and benefits of this medicine. If your activities have been limited by your condition, go back to your regularroutine slowly after treatment  with this medicine. What side effects may I notice from receiving this medication? Side effects that you should report to your doctor or health care professionalas soon as possible: allergic reactions like skin rash, itching or hives, swelling of the face, lips, or tongue breathing problems changes in vision chest pain or tightness eye irritation, pain fast, irregular heartbeat infection numbness speech  problems swallowing problems unusual weakness Side effects that usually do not require medical attention (report to yourdoctor or health care professional if they continue or are bothersome): bruising or pain at site where injected drooping eyelid dry eyes or mouth headache muscles aches, pains sensitivity to light tearing This list may not describe all possible side effects. Call your doctor for medical advice about side effects. You may report side effects to FDA at1-800-FDA-1088. Where should I keep my medication? This drug is given in a hospital or clinic and will not be stored at home. NOTE: This sheet is a summary. It may not cover all possible information. If you have questions about this medicine, talk to your doctor, pharmacist, orhealth care provider.  2022 Elsevier/Gold Standard (2018-03-19 14:21:42)

## 2021-05-20 NOTE — Progress Notes (Signed)
[facility]    Provider:  Dr Julie Gilbert Requesting Provider: Vivi Barrack, MD Primary Care Provider:  Vivi Barrack, MD  CC:  Migraines  HPI:  Julie Gilbert is a 70 y.o. female here as requested by Julie Barrack, MD for migraines. PMHx migraine, anxiety, asthma, chronic kidney disease, coronary artery disease, diabetes, dyslipidemia, hypertension, depression, osteoarthritis, lacunar stroke, B12 deficiency, vertigo. Here with dauhter who also provides information. She is doing a lot better with her migraines. She has a cardiologist. She is on a heart monitor. Her migraines cn be severe in the left side back and side, pulsating/pounding/throbbing/, pulsating/pounding/throbbing, photophobia/phonophobia, most severe pain is the back in the left, the Emgality helps a lot, she is also on Topamax. Extremely severe. Nausea, she would pass out it was so severe. Used to have daily headaches, weather can be a trigger, a lot less maybe about 6 a month, was not able to get the [place].  Mother had tremors. With action and posture. Order Roselyn Meier for acute management. Preventative Topamax and emgality. Discussed botox.No other focal neurologic deficits, associated symptoms, inciting events or modifiable factors.  Reviewed notes, labs and imaging from outside physicians, which showed:  From a thorough review of records, medications tried that can be used in migraine management include: Amlodipine (calcium channel blocker similar to verapamil), aspirin, Fioricet, Voltaren tablet, Benadryl, Emgality, Lexapro, gabapentin, meclizine, melatonin, Reglan injections, naproxen, Zofran, prednisone tablets, Phenergan tablets, Imitrex, Topamax, trazodone, propranolol contraindicated due to asthma, amitriptyline/nortrio contraindicated due to being on lexapro, TRIPTANS CONTRAINDICATED due to strokes  B12 > 1550 Vit d 75 Hgba1c 6   MRI brain 11/2017: Brain: reviewed images and agree: Stable cerebral  volume since 2017. No restricted diffusion to suggest acute infarction. No midline shift, mass effect, evidence of mass lesion, ventriculomegaly, extra-axial collection or acute intracranial hemorrhage. Cervicomedullary junction and pituitary are within normal limits.   Small chronic lacunar infarcts in the right caudate and left cerebellum are stable since 2017. T2 heterogeneity elsewhere in the deep gray matter nuclei is stable and appears largely due to increased perivascular spaces. The brainstem and right cerebellum remain normal.   Minimal to mild for age scattered nonspecific cerebral white matter T2 and FLAIR hyperintensity is stable. A chronic microhemorrhage in the right anterior subinsular white matter is stable since 2017, and is located near a developmental venous anomaly (normal variant). A small chronic microhemorrhage in the left parietal lobe subcortical white matter on series 13, image 62 may be new since that time. No other chronic cerebral blood products identified.   No abnormal enhancement identified.  No dural thickening   Vascular: Major intracranial vascular flow voids are stable since 2017. Mildly dominant distal right vertebral artery with mild generalized intracranial artery tortuosity, and possible fetal type PCA origins. The major dural venous sinuses are enhancing and appear patent.   Skull and upper cervical spine: Negative visible cervical spine and spinal cord. Normal bone marrow signal. Incidental hyperostosis of the calvarium (normal variant).   Sinuses/Orbits: Orbits soft tissues appear stable and normal. Paranasal Visualized paranasal sinuses and mastoids are stable and well pneumatized.   Other: Visible internal auditory structures appear normal. Scalp and face soft tissues appear negative.   IMPRESSION: 1.  No acute intracranial abnormality. 2. 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    Review of Systems: Patient complains of symptoms per HPI as well as  the following symptoms chest pain. Pertinent negatives and positives per HPI. All others negative.   Social History   Socioeconomic History   Marital status: Divorced    Spouse name: Not on file   Number of children: Not on file   Years of education: Not on file   Highest education level: Not on file  Occupational History   Not on file  Tobacco Use   Smoking status: Former    Types: Cigarettes    Quit date: 09/22/2012    Years since quitting: 8.6   Smokeless tobacco: Never  Vaping Use   Vaping Use: Never used  Substance and Sexual Activity   Alcohol use: Yes    Alcohol/week: 1.0 standard drink    Types: 1 Glasses of wine per week    Comment: red wine 1-2 x month   Drug use: No   Sexual activity: Not Currently  Other Topics Concern   Not on file  Social History Narrative   Lives with daughter Julie Gilbert)    Right handed   Caffeine: none    Social Determinants of Health   Financial Resource Strain: Not on file  Food Insecurity: Not on file  Transportation Needs: Not on file  Physical Activity: Not on file  Stress: Not on file  Social Connections: Not on file  Intimate Partner Violence: Not on file    Family History  Problem Relation Age of Onset   Kidney failure Mother    Hypertension Mother    Heart disease Father    Cancer Maternal Grandfather    Migraines Other    Breast cancer Neg Hx     Past Medical History:  Diagnosis Date   Anxiety 05/18/2018   Asthma    Chronic anemia 06/29/2016   Chronic pain of both knees 02/15/2019   CKD stage 3 due to type 2 diabetes mellitus ([place]) 05/18/2018   Coronary artery disease 10/01/2018   Diabetes mellitus without complication ([place])    Dyslipidemia associated with type 2 diabetes mellitus ([place]) 05/18/2018   Lab Results Component Value Date  CHOL 198 08/17/2018  HDL 62.40 08/17/2018  LDLCALC 113  (H) 08/17/2018  TRIG 113.0 08/17/2018  CHOLHDL 3 08/17/2018     Gastroesophageal reflux disease without esophagitis 06/18/2015   Herniated intervertebral disc of lumbar spine 05/01/2017   Hypertension    Hypertension associated with diabetes ([place]) 05/13/2015   IBS (irritable bowel syndrome)    Insomnia 05/13/2015   Migraine headache 05/13/2015   Moderate episode of recurrent major depressive disorder ([place]) 01/04/2017   Osteoarthritis of spine 02/03/2017   Transient ischemic attack (TIA) 06/28/2016   Type 2 diabetes mellitus with neurological complications ([place]) 8/67/5449   Lab Results Component Value Date  HGBA1C 5.9 08/17/2018  HGBA1C 5.8 01/04/2017  HGBA1C 6.4 (H) 06/29/2016  Lab Results Component Value Date  MICROALBUR 10.3 (H) 08/17/2018  LDLCALC 113 (H) 08/17/2018  CREATININE 1.56 (H) 08/17/2018      Patient Active Problem List   Diagnosis Date Noted   Chronic migraine without aura without status migrainosus, not intractable 05/20/2021   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   Allergic rhinitis 02/11/2021   Vertigo 02/27/2020   B12 deficiency 02/11/2020   Iron deficiency 02/11/2020   Inflamed seborrheic keratosis 01/30/2020   Asthma 06/27/2019   Chronic pain of both knees 02/15/2019   Coronary artery disease 10/01/2018   Dyslipidemia associated with type 2 diabetes mellitus ([place]) 05/18/2018   Anxiety 05/18/2018   CKD stage 3 due to type 2 diabetes  mellitus ([place]) 05/18/2018   Chest pain syndrome 10/27/2017   Herniated intervertebral disc of lumbar spine 05/01/2017   Osteoarthritis of spine 02/03/2017   Moderate episode of recurrent major depressive disorder ([place]) 01/04/2017   Chronic anemia 06/29/2016   Gastroesophageal reflux disease without esophagitis 06/18/2015   Hypertension associated with diabetes ([place]) 05/13/2015   Type 2 diabetes mellitus with neurological complications ([place]) 74/94/4967   IBS (irritable bowel syndrome) 05/13/2015   Migraine headache 05/13/2015   Insomnia  05/13/2015    Past Surgical History:  Procedure Laterality Date   CESAREAN SECTION     EXPLORATORY LAPAROTOMY      Current Outpatient Medications  Medication Sig Dispense Refill   albuterol (VENTOLIN HFA) 108 (90 Base) MCG/ACT inhaler TAKE 2 PUFFS BY MOUTH EVERY 6 HOURS AS NEEDED FOR WHEEZE OR SHORTNESS OF BREATH 6.7 each 6   Ascorbic Acid (VITAMIN C) 1000 MG tablet Take 1,000 mg by mouth daily.     aspirin 81 MG tablet Take 81 mg by mouth daily.     atorvastatin (LIPITOR) 80 MG tablet Take 80 mg by mouth daily.     Blood Glucose Monitoring Suppl (ACCU-CHEK AVIVA PLUS) w/Device KIT Use as directed to check blood sugar once daily. E11.9 1 kit 0   Blood Pressure Monitoring (BLOOD PRESSURE KIT) DEVI 1 application by Does not apply route daily. Use to check blood pressure daily 1 Device 0   Calcium Carbonate-Vitamin D 600-125 MG-UNIT TABS Take 600 Units by mouth daily.     Cholecalciferol 1000 units tablet Take 1,000 Units by mouth daily.     clonazePAM (KLONOPIN) 1 MG tablet TAKE 1 TABLET BY MOUTH 3 TIMES DAILY. 270 tablet 3   COD LIVER OIL PO Take 500 Units by mouth daily.     [facility] OLOPATADINE HCL 0.2 % SOLN APPLY TO AFFECTED AREA EVERY DAY 2.5 mL 0   DEXILANT 60 MG capsule Take 1 capsule by mouth daily.     diclofenac (VOLTAREN) 75 MG EC tablet TAKE 1 TABLET BY MOUTH TWICE A DAY 60 tablet 0   docusate sodium (COLACE) 100 MG capsule Take 1 capsule (100 mg total) by mouth 2 (two) times daily. 10 capsule 0   escitalopram (LEXAPRO) 10 MG tablet Take 1 tablet (10 mg total) by mouth daily. 90 tablet 3   Fish Oil-Cholecalciferol (FISH OIL + D3) 1000-1000 MG-UNIT CAPS Take 1 tablet by mouth daily.     glucose blood (ACCU-CHEK AVIVA PLUS) test strip Use as instructed to check blood sugar once daily. E11.9 100 each 12   Lancets (ACCU-CHEK SOFT TOUCH) lancets Use as instructed to check blood sugar once daily E11.9 100 each 12   losartan (COZAAR) 25 MG tablet Take 1 tablet (25 mg total) by mouth  daily. 90 tablet 3   MAGNESIUM PO Take 840 mg by mouth daily. 1 1/2 daily     meclizine (ANTIVERT) 25 MG tablet Take 1 tablet (25 mg total) by mouth 3 (three) times daily. 180 tablet 3   Multiple Vitamin (MULTIVITAMIN WITH MINERALS) TABS tablet Take 1 tablet by mouth daily.     neomycin-polymyxin-hydrocortisone (CORTISPORIN) OTIC solution Apply 1-2 drops to the toe after soaking toe twice a day 10 mL 0   nitroGLYCERIN (NITROSTAT) 0.4 MG SL tablet PLACE 1 TABLET (0.4 MG TOTAL) UNDER THE TONGUE EVERY 5 (FIVE) MINUTES AS NEEDED FOR CHEST PAIN. 25 tablet 10   OVER THE COUNTER MEDICATION 6.25 mg daily. [place]     pantoprazole (PROTONIX) 40 MG tablet  TAKE 1 TABLET BY MOUTH EVERY DAY 90 tablet 1   predniSONE (DELTASONE) 20 MG tablet Take 20 mg by mouth every morning.     promethazine (PHENERGAN) 12.5 MG tablet Take 1 tablet (12.5 mg total) by mouth every 8 (eight) hours as needed for nausea or vomiting. 20 tablet 0   Riboflavin (VITAMIN B-2 PO) Take 250 mg by mouth daily.     silver nitrate applicators 41-32 % applicator Apply topically 3 (three) times a week. 100 each 0   SYMBICORT 160-4.5 MCG/ACT inhaler TAKE 2 PUFFS BY MOUTH TWICE A DAY 30.6 each 3   Ubrogepant (UBRELVY) 100 MG TABS Take 100 mg by mouth every 2 (two) hours as needed. Maximum $RemoveBeforeD'200mg'dLzWwCjnMIEDja$  a day. 16 tablet 11   vitamin B-12 (CYANOCOBALAMIN) 100 MCG tablet Take 100 mcg by mouth daily.     EMGALITY 120 MG/ML SOAJ Inject 120 ml monthly. 1.12 mL 11   topiramate (TOPAMAX) 50 MG tablet Take 1 tablet (50 mg total) by mouth 2 (two) times daily. 180 tablet 4   No current facility-administered medications for this visit.    Allergies as of 05/20/2021 - Review Complete 05/20/2021  Allergen Reaction Noted   Peanuts [peanut oil] Anaphylaxis 04/08/2015   Shrimp [shellfish allergy] Anaphylaxis 04/08/2015   Latex Hives 04/08/2015   Elemental sulfur Itching and Swelling 01/30/2020   Penicillins Hives 09/27/2017   Pineapple  05/01/2017   Sulfa  antibiotics  02/11/2020    Vitals: BP 132/85 (BP Location: Left Arm, Patient Position: Sitting)   Pulse (!) 56   Ht $R'5\' 6"'CM$  (1.676 m)   Wt 182 lb (82.6 kg)   LMP  (LMP Unknown) Comment: Menopausal  BMI 29.38 kg/m  Last Weight:  Wt Readings from Last 1 Encounters:  05/20/21 182 lb (82.6 kg)   Last Height:   Ht Readings from Last 1 Encounters:  05/20/21 $RemoveB'5\' 6"'GJOuWUwz$  (1.676 m)     Physical exam: Exam: Gen: NAD, conversant, well nourised, well groomed                     CV: RRR, no MRG. No Carotid Bruits. No peripheral edema, warm, nontender Eyes: Conjunctivae clear without exudates or hemorrhage  Neuro: Detailed Neurologic Exam  Speech:    Speech is normal; fluent and spontaneous with normal comprehension.  Cognition:    The patient is oriented to person, place, and time;     recent and remote memory intact;     language fluent;     normal attention, concentration,     fund of knowledge Cranial Nerves:    The pupils are equal, round, and reactive to light. The fundi are normal and spontaneous venous pulsations are present. Visual fields are full to finger confrontation. Extraocular movements are intact. Trigeminal sensation is intact and the muscles of mastication are normal. The face is symmetric. The palate elevates in the midline. Hearing intact. Voice is normal. Shoulder shrug is normal. The tongue has normal motion without fasciculations.   Coordination:    Normal finger to nose   Gait:    Heel-toe gait are normal.   Motor Observation:    No asymmetry, no atrophy, postural and action tremor Tone:    Normal muscle tone.    Posture:    Posture is normal. normal erect    Strength:    Strength is V/V in the upper and lower limbs.      Sensation: intact to LT     Reflex Exam:  DTR's:  Deep tendon reflexes in the upper and lower extremities are symmetrical  bilaterally.   Toes:    The toes are downgoing bilaterally.   Clonus:    Clonus is absent.     Assessment/Plan:  Chronic migraine without aura doing better on current management  Prevention: Continue Emgality and Topamax Actue/Emergency: Roselyn Meier Consider botox: call us Essential tremor: discussed (has Fhx of tremor) F/u 4 months   Meds ordered this encounter  Medications   EMGALITY 120 MG/ML SOAJ    Sig: Inject 120 ml monthly.    Dispense:  1.12 mL    Refill:  11   topiramate (TOPAMAX) 50 MG tablet    Sig: Take 1 tablet (50 mg total) by mouth 2 (two) times daily.    Dispense:  180 tablet    Refill:  4   Ubrogepant (UBRELVY) 100 MG TABS    Sig: Take 100 mg by mouth every 2 (two) hours as needed. Maximum $RemoveBeforeD'200mg'GpEQEyvbXCozZy$  a day.    Dispense:  16 tablet    Refill:  11    Triptans contraindicated due to strokes.    Cc: Julie Barrack, MD,  Julie Barrack, MD  Sarina Ill, MD  [facility] [address] [place], [place] [address]  Phone [phone] Fax [phone]

## 2021-06-14 ENCOUNTER — Other Ambulatory Visit: Payer: Self-pay | Admitting: Family Medicine

## 2021-06-17 ENCOUNTER — Telehealth: Payer: Self-pay | Admitting: *Deleted

## 2021-06-17 NOTE — Telephone Encounter (Signed)
-----   Message from Anson Fret, MD sent at 06/16/2021 11:41 AM EDT ----- Cardiac event monitor was normal, great news thanks

## 2021-06-17 NOTE — Telephone Encounter (Signed)
I called the patient and LVM (ok per DPR) advising the cardiac event monitor was normal which is great news.  I did advise the patient that the cardiologist noted the monitor was only worn for < 5 days.  I asked the patient to give [place] a call back and let us know if she had any symptoms while she was wearing the monitor. Left office number in message.

## 2021-06-17 NOTE — Telephone Encounter (Signed)
Patient returned my call.  We discussed the results of the cardiac event monitor.  Patient stated the reason why she took it off early was due to the adhesive "pulling skin off" her chest.  Patient also advised that she did not tolerate the samples that Dr. Lucia Gaskins gave her.  She states they gave her a severe migraine.  She is continuing to take her Topamax.  She does not think that the new medication " corresponds well" with all the other medications she is on and she will not continue taking it.  Adhesive added to allergy list.

## 2021-06-28 ENCOUNTER — Telehealth: Payer: Self-pay

## 2021-06-28 ENCOUNTER — Encounter: Payer: Self-pay | Admitting: Family Medicine

## 2021-06-28 ENCOUNTER — Telehealth (INDEPENDENT_AMBULATORY_CARE_PROVIDER_SITE_OTHER): Payer: Medicare Other | Admitting: Family Medicine

## 2021-06-28 VITALS — Ht 66.0 in | Wt 183.0 lb

## 2021-06-28 DIAGNOSIS — J329 Chronic sinusitis, unspecified: Secondary | ICD-10-CM | POA: Diagnosis not present

## 2021-06-28 DIAGNOSIS — J309 Allergic rhinitis, unspecified: Secondary | ICD-10-CM

## 2021-06-28 DIAGNOSIS — Z01419 Encounter for gynecological examination (general) (routine) without abnormal findings: Secondary | ICD-10-CM

## 2021-06-28 MED ORDER — AZITHROMYCIN 250 MG PO TABS: ORAL_TABLET | ORAL | 0 refills | Status: DC

## 2021-06-28 NOTE — Telephone Encounter (Signed)
See below. Appointments are needed now for referrals per LIsa.

## 2021-06-28 NOTE — Progress Notes (Signed)
   Julie Gilbert is a 70 y.o. female who presents today for a virtual office visit.  Assessment/Plan:  New/Acute Problems: Sinusitis We will start azithromycin.  Due to recurrent nature and poorly controlled allergic rhinitis we will also place referral to ENT for further management and evaluation.  Chronic Problems Addressed Today: Allergic rhinitis Flared up recently.  He has tried several nasal sprays and Singulair without significant improvement in the past.  Will place referral to ENT for further evaluation per patient request.  Preventative Healthcare Will place referral to OBGYN    Subjective:  HPI:  See A/P for status of chronic conditions.  Patient has had concern for possible sinus infection for the past several weeks.  Has noticed more congestion, facial burning, and ear pulling/plugging.  She has tried over-the-counter meds without significant improvement.  She had something similar in the past.  She would like to see ear nose and throat.  This has become a recurrent issue for her multiple times per year.       Objective/Observations  Physical Exam: Gen: NAD, resting comfortably Pulm: Normal work of breathing Neuro: Grossly normal, moves all extremities Psych: Normal affect and thought content  Virtual Visit via Video   I connected with [name] on 06/28/21 at  3:40 PM EDT by a video enabled telemedicine application and verified that I am speaking with the correct person using two identifiers. The limitations of evaluation and management by telemedicine and the availability of in person appointments were discussed. The patient expressed understanding and agreed to proceed.   Patient location: Home Provider location: [place] [employer] Persons participating in the virtual visit: Myself and Patient and her daughter     Katina Degree. [name], MD 06/28/2021 3:47 PM

## 2021-06-28 NOTE — Telephone Encounter (Signed)
Patient and her daughter called in wanting a referral for ENT and OB/GYN. Advised to keep appt for today to have an appt note to attach to referral. Patient's daughter states they do not need that - just need the referral. Advised I would send message back to Dr. [name], but no promises a referral will be accepted with no appt notes to attach. Daughter voiced understanding.

## 2021-06-28 NOTE — Assessment & Plan Note (Addendum)
Flared up recently.  He has tried several nasal sprays and Singulair without significant improvement in the past.  Will place referral to ENT for further evaluation per patient request.

## 2021-07-02 ENCOUNTER — Telehealth: Payer: Self-pay

## 2021-07-02 NOTE — Telephone Encounter (Signed)
LAST APPOINTMENT DATE:  06/28/21  NEXT APPOINTMENT DATE: none  MEDICATION: Alma Friendly  PHARMACY: [id number] - [place], [place] - [address]

## 2021-07-02 NOTE — Telephone Encounter (Signed)
This was discontinued last year. Do not need to restart. Recommend office visit to discuss and check A1c.   Katina Degree. [name], MD 07/02/2021 4:05 PM

## 2021-07-02 NOTE — Telephone Encounter (Signed)
Januvia not on current med list, ok to refill?

## 2021-07-05 ENCOUNTER — Telehealth: Payer: Self-pay

## 2021-07-05 NOTE — Telephone Encounter (Signed)
Patient is calling in wanting to get scheduled for a lab appointment to check her A1c levels. Okay for orders?

## 2021-07-05 NOTE — Telephone Encounter (Signed)
Lvm advising to schedule an appointment.

## 2021-07-05 NOTE — Telephone Encounter (Signed)
See below, please schedule pt for follow up on a1c.

## 2021-07-05 NOTE — Telephone Encounter (Signed)
Better if she comes in for OV that way we can discuss at her visit.  Katina Degree. [name], MD 07/05/2021 2:43 PM

## 2021-07-05 NOTE — Telephone Encounter (Signed)
Is this ok? Last a1c 02/11/21. Does she need ov or can this be checked without ov?

## 2021-07-05 NOTE — Telephone Encounter (Signed)
LVM for pt to call back for an appt.

## 2021-07-05 NOTE — Telephone Encounter (Signed)
Please schedule pt for ov to f/u on a1c.

## 2021-07-06 ENCOUNTER — Other Ambulatory Visit: Payer: Self-pay

## 2021-07-06 ENCOUNTER — Ambulatory Visit (INDEPENDENT_AMBULATORY_CARE_PROVIDER_SITE_OTHER): Payer: Medicare Other | Admitting: Family Medicine

## 2021-07-06 ENCOUNTER — Ambulatory Visit: Payer: Medicare Other

## 2021-07-06 VITALS — BP 143/90 | HR 61 | Temp 97.4°F | Ht 66.0 in | Wt 182.4 lb

## 2021-07-06 DIAGNOSIS — G4700 Insomnia, unspecified: Secondary | ICD-10-CM | POA: Diagnosis not present

## 2021-07-06 DIAGNOSIS — J45909 Unspecified asthma, uncomplicated: Secondary | ICD-10-CM | POA: Diagnosis not present

## 2021-07-06 DIAGNOSIS — E1149 Type 2 diabetes mellitus with other diabetic neurological complication: Secondary | ICD-10-CM | POA: Diagnosis not present

## 2021-07-06 DIAGNOSIS — E1159 Type 2 diabetes mellitus with other circulatory complications: Secondary | ICD-10-CM

## 2021-07-06 DIAGNOSIS — I152 Hypertension secondary to endocrine disorders: Secondary | ICD-10-CM | POA: Diagnosis not present

## 2021-07-06 LAB — POCT GLYCOSYLATED HEMOGLOBIN (HGB A1C): Hemoglobin A1C: 5.1 % (ref 4.0–5.6)

## 2021-07-06 MED ORDER — ALBUTEROL SULFATE HFA 108 (90 BASE) MCG/ACT IN AERS: INHALATION_SPRAY | RESPIRATORY_TRACT | 6 refills | Status: DC

## 2021-07-06 MED ORDER — MIRTAZAPINE 7.5 MG PO TABS
7.5000 mg | ORAL_TABLET | Freq: Every day | ORAL | 5 refills | Status: DC
Start: 2021-07-06 — End: 2021-07-28

## 2021-07-06 NOTE — Patient Instructions (Signed)
It was very nice to see you today!  A1c looks great today.  Please continue the good work with your diet and exercise.  Please try the Remeron at night to help you with sleep.  Please send a message in a week or 2 to let me know how this is working for you.  I will see you back in 3 to 6 months for your regular follow-up.  Please come back to see me sooner if needed.  Take care, Dr [name]  PLEASE NOTE:  If you had any lab tests please let us know if you have not heard back within a few days. You may see your results on mychart before we have a chance to review them but we will give you a call once they are reviewed by [place]. If we ordered any referrals today, please let us know if you have not heard from their office within the next week.   Please try these tips to maintain a healthy lifestyle:  Eat at least 3 REAL meals and 1-2 snacks per day.  Aim for no more than 5 hours between eating.  If you eat breakfast, please do so within one hour of getting up.   Each meal should contain half fruits/vegetables, one quarter protein, and one quarter carbs (no bigger than a computer mouse)  Cut down on sweet beverages. This includes juice, soda, and sweet tea.   Drink at least 1 glass of water with each meal and aim for at least 8 glasses per day  Exercise at least 150 minutes every week.

## 2021-07-06 NOTE — Assessment & Plan Note (Signed)
Mildly worsened since last visit.  She attributes this to the change in seasons.  Will refill albuterol today.  Continue Symbicort.

## 2021-07-06 NOTE — Assessment & Plan Note (Signed)
Discussed options.  We will start low-dose Remeron 7.5 mg nightly.  Patient is also concerned about low appetite.  Therapist will help some with this as well.  She will check in with me in 1 to 2 weeks via MyChart.  We can adjust the dose as needed.

## 2021-07-06 NOTE — Progress Notes (Signed)
   Julie Gilbert is a 70 y.o. female who presents today for an office visit.  Assessment/Plan:  Chronic Problems Addressed Today: Insomnia Discussed options.  We will start low-dose Remeron 7.5 mg nightly.  Patient is also concerned about low appetite.  Therapist will help some with this as well.  She will check in with me in 1 to 2 weeks via MyChart.  We can adjust the dose as needed.  Type 2 diabetes mellitus with neurological complications (HCC) A1c 5.1 off meds.  Congratulated patient on lifestyle changes.  Check again in 3 to 6 months.  Hypertension associated with diabetes (HCC) At goal per JNC 8.  Continue losartan 25 mg daily  Asthma Mildly worsened since last visit.  She attributes this to the change in seasons.  Will refill albuterol today.  Continue Symbicort.     Subjective:  HPI:  In addition to this, she is trying to lose weight. She is following a healthy diet. She reports she have loss of appetite since she started the diet. She is requesting a refill of Albuterol for her asthma. She still have some cough.  She also have hard time sleeping at night. This has been a long standing issue. Worsened recently. Clonazepam does not seem to help        Objective:  Physical Exam: BP (!) 143/90   Pulse 61   Temp (!) 97.4 F (36.3 C) (Temporal)   Ht 5\' 6"  (1.676 m)   Wt 182 lb 6.4 oz (82.7 kg)   LMP  (LMP Unknown) Comment: Menopausal  SpO2 100%   BMI 29.44 kg/m   Gen: No acute distress, resting comfortably CV: Regular rate and rhythm with no murmurs appreciated Pulm: Normal work of breathing, clear to auscultation bilaterally with no crackles, wheezes, or rhonchi Neuro: Grossly normal, moves all extremities Psych: Normal affect and thought content       I,Julie Gilbert,acting as a scribe for , MD.,have documented all relevant documentation on the behalf of Julie Doe, MD,as directed by  Julie Doe, MD while in the presence of Julie Doe, MD.    I, Julie Doe, MD, have reviewed all documentation for this visit. The documentation on 07/06/21 for the exam, diagnosis, procedures, and orders are all accurate and complete.  09/05/21. Katina Degree, MD 07/06/2021 10:18 AM

## 2021-07-06 NOTE — Assessment & Plan Note (Signed)
At goal per JNC 8.  Continue losartan 25 mg daily

## 2021-07-06 NOTE — Assessment & Plan Note (Signed)
A1c 5.1 off meds.  Congratulated patient on lifestyle changes.  Check again in 3 to 6 months.

## 2021-07-20 ENCOUNTER — Other Ambulatory Visit: Payer: Self-pay | Admitting: Family Medicine

## 2021-07-27 ENCOUNTER — Telehealth: Payer: Self-pay

## 2021-07-27 ENCOUNTER — Telehealth: Payer: Self-pay | Admitting: Family Medicine

## 2021-07-27 DIAGNOSIS — Z9189 Other specified personal risk factors, not elsewhere classified: Secondary | ICD-10-CM

## 2021-07-27 NOTE — Progress Notes (Signed)
[facility])                                            [facility] Quality Pharmacy Team                                        Statin Quality Measure Assessment    07/27/2021  Julie Gilbert 12/21/1950 [id number]  Next appointment with cardiologist: 08/10/2021  Per review of chart and payor information, this patient has been flagged for non-adherence to the following CMS Quality Measure:   []  Statin Use in Persons with Diabetes  [x]  Statin Use in Persons with Cardiovascular Disease  The ASCVD Risk score (Arnett DK, et al., 2019) failed to calculate for the following reasons:   The valid total cholesterol range is 130 to 320 mg/dL   Currently prescribed statin:  [x]  Yes []  No     Comments: Atorvastatin 80 mg currently on file but I called [facility] in which I confirmed medication was last picked up 08/31/2019. Given last LDL of 246 back in May of this year, PCP instructed patient to continue with atorvastatin since she reported she had not been taking medication. In was noted at the last cardiology visit in August, statin was discontinued without medical supervision. Per chart review, it is unclear why patient discontinued atorvastatin.  I called patient to discuss whether she has resumed atorvastatin but unable to reach - LVM. If deemed therapeutically appropriate, please consider medication adherence discussion. If atorvastatin was stopped d/t in tolerance please consider statin alternative or associating exclusion code (see options below) at the next O/v.   Please consider ONE of the following recommendations:      Initiate high intensity statin Atorvastatin 40mg  once daily, #90, 3 refills   Rosuvastatin 20mg  once daily, #90, 3 refills     Initiate moderate intensity          statin with reduced frequency if prior          statin intolerance 1x weekly, #13, 3 refills   2x weekly, #26, 3 refills   3x weekly, #39, 3 refills      Code for past statin intolerance or  other exclusions (required annually)   Provider Requirements: Associate code during an office visit or telehealth encounter  Drug Induced Myopathy G72.0   Myopathy, unspecified G72.9   Myositis, unspecified M60.9   Rhabdomyolysis M62.82   Myalgia - SPC ONLY  M79.1   Alcoholic fatty liver K70.0   Cirrhosis of liver K74.69   Prediabetes R73.03   Other abnormal blood glucose OR Elevated hemoglobin A1c R73.09   PCOS E28.2   Toxic liver disease, unspecified K71.9   Adverse effect of antihyperlipidemic and antiarteriosclerotic drugs, initial encounter - SUPD ONLY T46.6X5A     Thank you for your time,  , PharmD Clinical Pharmacist [facility] Cell: [phone]

## 2021-07-28 ENCOUNTER — Telehealth: Payer: Self-pay

## 2021-07-28 ENCOUNTER — Other Ambulatory Visit: Payer: Self-pay | Admitting: Family Medicine

## 2021-07-28 NOTE — Telephone Encounter (Signed)
Looks like this medication was submitted to the pharmacy.  States she forgot Dr. [name] had prescribed a different medication for sleep.  Patient states she will not pick this medication up.

## 2021-07-28 NOTE — Telephone Encounter (Signed)
Left message to return call to our office if refills needed  Patient has refills at her pharmacy

## 2021-07-28 NOTE — Telephone Encounter (Signed)
Rx was send on10/25/2022 Advise to call pharmacy

## 2021-07-28 NOTE — Telephone Encounter (Signed)
..   Encourage patient to contact the pharmacy for refills or they can request refills through [facility]  LAST APPOINTMENT DATE:  07/06/21  NEXT APPOINTMENT DATE:  10/11/21  MEDICATION:  Diclofenac   Is the patient out of medication?   [facility]    Let patient know to contact pharmacy at the end of the day to make sure medication is ready.  Please notify patient to allow 48-72 hours to process  CLINICAL FILLS OUT ALL BELOW:   LAST REFILL:  QTY:  REFILL DATE:    OTHER COMMENTS:    Okay for refill?  Please advise

## 2021-07-28 NOTE — Telephone Encounter (Signed)
FYI

## 2021-07-28 NOTE — Telephone Encounter (Signed)
Patient called back.  States she does have that script at the pharmacy.  States she was mistaken.  States she needs a refill on nitroglycerin.

## 2021-07-29 ENCOUNTER — Other Ambulatory Visit: Payer: Self-pay

## 2021-07-29 ENCOUNTER — Ambulatory Visit (INDEPENDENT_AMBULATORY_CARE_PROVIDER_SITE_OTHER): Payer: Medicare Other

## 2021-07-29 DIAGNOSIS — Z23 Encounter for immunization: Secondary | ICD-10-CM | POA: Diagnosis not present

## 2021-07-29 NOTE — Progress Notes (Signed)
Pt had both Tdap and Shingles #1 vaccine in the office today. She will make an appointment for #2 shingles vaccine for 2-6 months.

## 2021-08-02 ENCOUNTER — Telehealth: Payer: Self-pay

## 2021-08-02 NOTE — Telephone Encounter (Signed)
Nurse Assessment Nurse: Annye English RN, Denise Date/Time (Eastern Time): 08/02/2021 2:36:47 PM Confirm and document reason for call. If symptomatic, describe symptoms. ---Caller states she has had a reaction from her tetanus and shingles vaccination on 11/3. Symptoms started on 11/4 and has had dizziness every since, diarrhea and last pm had temp of 102 but afebrile today  Disp. Time Lamount Cohen Time) Disposition Final User  08/02/2021 1:57:34 PM -- Diamantina Monks To Nurse Williams Sink, RN, April 08/02/2021 2:51:22 PM -- Home Care Yes Carmon, RN, Angelique Blonder  Does the patient have any new or worsening symptoms? ---Yes Will a triage be completed? ---Yes Related visit to physician within the last 2 weeks? ---No Does the PT have any chronic conditions? (i.e. diabetes, asthma, this includes High risk factors for pregnancy, etc.) ---Yes List chronic conditions. ---IBS, Arrythmia EUK per dtr and pt Is this a behavioral health or substance abuse call? ---No  Guideline Title Affirmed Question Affirmed Notes Nurse Date/Time (Eastern Time) Immunization Reactions: Shingles (Herpes zoster; Shingrix) vaccine reactions Carmon, RN, Angelique Blonder 08/02/2021 2:43:58 PM

## 2021-08-03 NOTE — Telephone Encounter (Signed)
See note

## 2021-08-04 ENCOUNTER — Other Ambulatory Visit: Payer: Self-pay | Admitting: Family Medicine

## 2021-08-05 ENCOUNTER — Other Ambulatory Visit: Payer: Self-pay

## 2021-08-05 ENCOUNTER — Ambulatory Visit (INDEPENDENT_AMBULATORY_CARE_PROVIDER_SITE_OTHER): Payer: Medicare Other | Admitting: Sports Medicine

## 2021-08-05 DIAGNOSIS — M79675 Pain in left toe(s): Secondary | ICD-10-CM

## 2021-08-05 DIAGNOSIS — Z9889 Other specified postprocedural states: Secondary | ICD-10-CM

## 2021-08-05 DIAGNOSIS — G8929 Other chronic pain: Secondary | ICD-10-CM

## 2021-08-05 MED ORDER — GABAPENTIN 300 MG PO CAPS: 300.0000 mg | ORAL_CAPSULE | Freq: Every day | ORAL | 3 refills | Status: DC

## 2021-08-05 NOTE — Telephone Encounter (Signed)
See note

## 2021-08-05 NOTE — Telephone Encounter (Signed)
She is not listening to medical advice and it is manipulative behavior to state she will find another provider if she cannot be seen today when we have other providers and I have openings tomorrow.   We will not be able to see her today.  Julie Gilbert. [name], MD 08/05/2021 12:25 PM

## 2021-08-05 NOTE — Patient Instructions (Signed)
Carpe OTC lotion or spray for sweaty feet

## 2021-08-05 NOTE — Progress Notes (Signed)
Subjective: Julie Gilbert is a 70 y.o. female patient seen today in office for pain at left great toe. Patient has sharp pain and the left great toe is very sensitive getting worse over the last 6 months.  Patient reports that is very sensitive to touch sometimes has swelling cannot tolerate his shoe and the pain in the left great toe is keeping her up at night.  Patient is also status post last year permanent nail avulsion and exostectomy.  No other issues noted.   Patient Active Problem List   Diagnosis Date Noted   Chronic migraine without aura without status migrainosus, not intractable 05/20/2021   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   Allergic rhinitis 02/11/2021   Vertigo 02/27/2020   B12 deficiency 02/11/2020   Iron deficiency 02/11/2020   Inflamed seborrheic keratosis 01/30/2020   Asthma 06/27/2019   Chronic pain of both knees 02/15/2019   Coronary artery disease 10/01/2018   Dyslipidemia associated with type 2 diabetes mellitus ([place]) 05/18/2018   Anxiety 05/18/2018   CKD stage 3 due to type 2 diabetes mellitus ([place]) 05/18/2018   Chest pain syndrome 10/27/2017   Herniated intervertebral disc of lumbar spine 05/01/2017   Osteoarthritis of spine 02/03/2017   Moderate episode of recurrent major depressive disorder ([place]) 01/04/2017   Chronic anemia 06/29/2016   Gastroesophageal reflux disease without esophagitis 06/18/2015   Hypertension associated with diabetes ([place]) 05/13/2015   Type 2 diabetes mellitus with neurological complications ([place]) 33/54/5625   IBS (irritable bowel syndrome) 05/13/2015   Migraine headache 05/13/2015   Insomnia 05/13/2015    Current Outpatient Medications on File Prior to Visit  Medication Sig Dispense Refill   albuterol (VENTOLIN HFA) 108 (90 Base) MCG/ACT inhaler TAKE 2 PUFFS BY MOUTH EVERY 6 HOURS AS NEEDED FOR WHEEZE OR SHORTNESS OF BREATH 6.7 each 6   Ascorbic Acid (VITAMIN C) 1000 MG tablet Take 1,000 mg by mouth daily.     aspirin 81 MG  tablet Take 81 mg by mouth daily.     atorvastatin (LIPITOR) 80 MG tablet Take 80 mg by mouth daily.     Blood Glucose Monitoring Suppl (ACCU-CHEK AVIVA PLUS) w/Device KIT Use as directed to check blood sugar once daily. E11.9 1 kit 0   Blood Pressure Monitoring (BLOOD PRESSURE KIT) DEVI 1 application by Does not apply route daily. Use to check blood pressure daily 1 Device 0   Calcium Carbonate-Vitamin D 600-125 MG-UNIT TABS Take 600 Units by mouth daily.     Cholecalciferol 1000 units tablet Take 1,000 Units by mouth daily.     clonazePAM (KLONOPIN) 1 MG tablet TAKE 1 TABLET BY MOUTH THREE TIMES A DAY 270 tablet 1   COD LIVER OIL PO Take 500 Units by mouth daily.     [facility] OLOPATADINE HCL 0.2 % SOLN APPLY TO AFFECTED AREA EVERY DAY 2.5 mL 0   DEXILANT 60 MG capsule Take 1 capsule by mouth daily.     diclofenac (VOLTAREN) 75 MG EC tablet TAKE 1 TABLET BY MOUTH TWICE A DAY 60 tablet 0   docusate sodium (COLACE) 100 MG capsule Take 1 capsule (100 mg total) by mouth 2 (two) times daily. 10 capsule 0   EMGALITY 120 MG/ML SOAJ Inject 120 ml monthly. 1.12 mL 11   escitalopram (LEXAPRO) 10 MG tablet Take 1 tablet (10 mg total) by mouth daily. 90 tablet 3   Fish Oil-Cholecalciferol (FISH OIL + D3) 1000-1000 MG-UNIT CAPS Take 1 tablet by mouth daily.     glucose blood (  ACCU-CHEK AVIVA PLUS) test strip Use as instructed to check blood sugar once daily. E11.9 100 each 12   Lancets (ACCU-CHEK SOFT TOUCH) lancets Use as instructed to check blood sugar once daily E11.9 100 each 12   losartan (COZAAR) 25 MG tablet Take 1 tablet (25 mg total) by mouth daily. 90 tablet 3   MAGNESIUM PO Take 840 mg by mouth daily. 1 1/2 daily     meclizine (ANTIVERT) 25 MG tablet Take 1 tablet (25 mg total) by mouth 3 (three) times daily. 180 tablet 3   mirtazapine (REMERON) 7.5 MG tablet TAKE 1 TABLET BY MOUTH AT BEDTIME. 90 tablet 2   Multiple Vitamin (MULTIVITAMIN WITH MINERALS) TABS tablet Take 1 tablet by mouth daily.      nitroGLYCERIN (NITROSTAT) 0.4 MG SL tablet PLACE 1 TABLET (0.4 MG TOTAL) UNDER THE TONGUE EVERY 5 (FIVE) MINUTES AS NEEDED FOR CHEST PAIN. 25 tablet 10   OVER THE COUNTER MEDICATION 6.25 mg daily. Coleus     pantoprazole (PROTONIX) 40 MG tablet TAKE 1 TABLET BY MOUTH EVERY DAY 90 tablet 1   promethazine (PHENERGAN) 12.5 MG tablet Take 1 tablet (12.5 mg total) by mouth every 8 (eight) hours as needed for nausea or vomiting. 20 tablet 0   Riboflavin (VITAMIN B-2 PO) Take 250 mg by mouth daily.     SYMBICORT 160-4.5 MCG/ACT inhaler TAKE 2 PUFFS BY MOUTH TWICE A DAY 30.6 each 3   topiramate (TOPAMAX) 50 MG tablet Take 1 tablet (50 mg total) by mouth 2 (two) times daily. 180 tablet 4   Ubrogepant (UBRELVY) 100 MG TABS Take 100 mg by mouth every 2 (two) hours as needed. Maximum 237m a day. 16 tablet 11   vitamin B-12 (CYANOCOBALAMIN) 100 MCG tablet Take 100 mcg by mouth daily.     No current facility-administered medications on file prior to visit.    Allergies  Allergen Reactions   Peanuts [Peanut Oil] Anaphylaxis   Shrimp [Shellfish Allergy] Anaphylaxis   Latex Hives   Elemental Sulfur Itching and Swelling   Other     Adhesive on cardiac monitor irritated skin   Penicillins Hives    Has patient had a PCN reaction causing immediate rash, facial/tongue/throat swelling, SOB or lightheadedness with hypotension: No Has patient had a PCN reaction causing severe rash involving mucus membranes or skin necrosis: No Has patient had a PCN reaction that required hospitalization: No Has patient had a PCN reaction occurring within the last 10 years: No  If all of the above answers are "NO", then may proceed with Cephalosporin use.     Pineapple     Scratchy throat, Tongue swelling   Sulfa Antibiotics     Objective: There were no vitals filed for this visit.  Objective:  General: Well developed, nourished, in no acute distress, alert and oriented x3   Dermatological: Skin is warm, dry and  supple bilateral. Left hallux well-healed. There are no open sores, no preulcerative lesions, no rash or signs of infection present.  Vascular: Dorsalis Pedis artery and Posterior Tibial artery pedal pulses are 2/4 bilateral with immedate capillary fill time. Pedal hair growth present. No varicosities and no lower extremity edema present bilateral.   Neruologic: Grossly intact via light touch bilateral.  Increased hypersensitivity to left great toe.   Musculoskeletal: Pain diffusely with palpation to the left great toe    Assessment and Plan:  Problem List Items Addressed This Visit   None Visit Diagnoses     Chronic toe pain,  left foot    -  Primary   S/P nail surgery           -Patient seen and evaluated -Discussed with patient likely pain related to her nerves of the left great toe advised her at this time I do not recommend any further procedures since she still has pain even after nail removal and advised her that nerves can be very sensitive after having this procedure done -Rx gabapentin to see if this will help with any of her pain to the toe -Continue with good supportive shoes that do not rub the toe -Patient to return in 6 to 8 weeks for medication check or sooner if issues arise  Landis Martins, DPM

## 2021-08-05 NOTE — Telephone Encounter (Signed)
Patient has called back in stating she has had diarrhea, Dizziness, fever of 102, and now shaking.    States this has been going on since she got her shots in our office but now believes the shaking is being caused from a new medication she has been placed on.  After speaking with Hasna we have advised patient to be seen today for evaluation.  Patient has refused to be treated today by any provider.    Offered patient OV for in the morning with [facility] for evaluation as soon as possible since Dr. [name] was book until late tomorrow afternoon.  Patient stated she did not want to see any other providers.  Then stated she wanted a call from Dr. [name] today and only Dr. [name].  Stated that if she does not received a call from Dr. [name] today, she will then find another provider.

## 2021-08-09 ENCOUNTER — Other Ambulatory Visit: Payer: Self-pay | Admitting: Family Medicine

## 2021-08-09 NOTE — Progress Notes (Addendum)
Cardiology Office Note:    Date:  08/10/2021   ID:  Julie Gilbert, DOB 22-Nov-1950, MRN [id number]  PCP:  Vivi Barrack, MD  Cardiologist:  Sinclair Grooms, MD   Referring MD: Vivi Barrack, MD   Chief Complaint  Patient presents with   Coronary Artery Disease   Hypertension   Hyperlipidemia    History of Present Illness:    Julie Gilbert is a 70 y.o. female with a hx of CAD, hypertension, type 2 diabetes, transient ischemic attack, hyperlipidemia, asthma and CKD stage III, transferring for cardiology care from Dr Severiano Gilbert in [place] at [facility].   Patient and daughter discontinued amlodipine due to possible side effects. Cannot articulate what specifically was wrong. Statin was discontinued by primary care, Dr. Dimas Chyle, according to the daughter.  The most recent lipid panel demonstrated an LDL of 246 in May 2022.  According to patient,no reason was given other than that the "cholesterol is okay".  She denies angina.  Denies orthopnea,DOE, and PND.  Daughter complains that she snores loudly.  Has had a prior sleep study at neurology that did not meet criteria for the diagnosis of sleep apnea.  Past Medical History:  Diagnosis Date   Anxiety 05/18/2018   Asthma    Chronic anemia 06/29/2016   Chronic pain of both knees 02/15/2019   CKD stage 3 due to type 2 diabetes mellitus ([place]) 05/18/2018   Coronary artery disease 10/01/2018   Diabetes mellitus without complication ([place])    Dyslipidemia associated with type 2 diabetes mellitus ([place]) 05/18/2018   Lab Results Component Value Date  CHOL 198 08/17/2018  HDL 62.40 08/17/2018  LDLCALC 113 (H) 08/17/2018  TRIG 113.0 08/17/2018  CHOLHDL 3 08/17/2018     Gastroesophageal reflux disease without esophagitis 06/18/2015   Herniated intervertebral disc of lumbar spine 05/01/2017   Hypertension    Hypertension associated with diabetes ([place]) 05/13/2015   IBS (irritable bowel syndrome)    Insomnia 05/13/2015   Migraine  headache 05/13/2015   Moderate episode of recurrent major depressive disorder ([place]) 01/04/2017   Osteoarthritis of spine 02/03/2017   Transient ischemic attack (TIA) 06/28/2016   Type 2 diabetes mellitus with neurological complications ([place]) 03/25/1600   Lab Results Component Value Date  HGBA1C 5.9 08/17/2018  HGBA1C 5.8 01/04/2017  HGBA1C 6.4 (H) 06/29/2016  Lab Results Component Value Date  MICROALBUR 10.3 (H) 08/17/2018  LDLCALC 113 (H) 08/17/2018  CREATININE 1.56 (H) 08/17/2018      Past Surgical History:  Procedure Laterality Date   CESAREAN SECTION     EXPLORATORY LAPAROTOMY      Current Medications: Current Meds  Medication Sig   albuterol (VENTOLIN HFA) 108 (90 Base) MCG/ACT inhaler TAKE 2 PUFFS BY MOUTH EVERY 6 HOURS AS NEEDED FOR WHEEZE OR SHORTNESS OF BREATH   Ascorbic Acid (VITAMIN C) 1000 MG tablet Take 1,000 mg by mouth daily.   aspirin 81 MG tablet Take 81 mg by mouth daily.   Blood Glucose Monitoring Suppl (ACCU-CHEK AVIVA PLUS) w/Device KIT Use as directed to check blood sugar once daily. E11.9   Calcium Carbonate-Vitamin D 600-125 MG-UNIT TABS Take 600 Units by mouth daily.   Cholecalciferol 1000 units tablet Take 1,000 Units by mouth daily.   clonazePAM (KLONOPIN) 1 MG tablet TAKE 1 TABLET BY MOUTH THREE TIMES A DAY   COD LIVER OIL PO Take 500 Units by mouth daily.   [employer] OLOPATADINE HCL 0.2 % SOLN APPLY TO AFFECTED AREA  EVERY DAY   DEXILANT 60 MG capsule Take 1 capsule by mouth daily.   docusate sodium (COLACE) 100 MG capsule Take 1 capsule (100 mg total) by mouth 2 (two) times daily.   EMGALITY 120 MG/ML SOAJ Inject 120 ml monthly.   escitalopram (LEXAPRO) 10 MG tablet Take 1 tablet (10 mg total) by mouth daily.   Fish Oil-Cholecalciferol (FISH OIL + D3) 1000-1000 MG-UNIT CAPS Take 1 tablet by mouth daily.   gabapentin (NEURONTIN) 300 MG capsule Take 1 capsule (300 mg total) by mouth at bedtime.   glucose blood (ACCU-CHEK AVIVA PLUS) test strip Use as instructed to  check blood sugar once daily. E11.9   Lancets (ACCU-CHEK SOFT TOUCH) lancets Use as instructed to check blood sugar once daily E11.9   losartan (COZAAR) 25 MG tablet Take 1 tablet (25 mg total) by mouth daily.   meclizine (ANTIVERT) 25 MG tablet Take 1 tablet (25 mg total) by mouth 3 (three) times daily.   Multiple Vitamin (MULTIVITAMIN WITH MINERALS) TABS tablet Take 1 tablet by mouth daily.   nitroGLYCERIN (NITROSTAT) 0.4 MG SL tablet PLACE 1 TABLET (0.4 MG TOTAL) UNDER THE TONGUE EVERY 5 (FIVE) MINUTES AS NEEDED FOR CHEST PAIN.   OVER THE COUNTER MEDICATION 6.25 mg daily. [place]   Riboflavin (VITAMIN B-2 PO) Take 250 mg by mouth daily.   rosuvastatin (CRESTOR) 40 MG tablet Take 1 tablet (40 mg total) by mouth daily.   SYMBICORT 160-4.5 MCG/ACT inhaler TAKE 2 PUFFS BY MOUTH TWICE A DAY   topiramate (TOPAMAX) 50 MG tablet Take 1 tablet (50 mg total) by mouth 2 (two) times daily.   Ubrogepant (UBRELVY) 100 MG TABS Take 100 mg by mouth every 2 (two) hours as needed. Maximum 253m a day.   vitamin B-12 (CYANOCOBALAMIN) 100 MCG tablet Take 100 mcg by mouth daily.   [DISCONTINUED] atorvastatin (LIPITOR) 80 MG tablet Take 80 mg by mouth daily.     Allergies:   Peanuts [peanut oil], Shrimp [shellfish allergy], Latex, Elemental sulfur, Other, Penicillins, Pineapple, and Sulfa antibiotics   Social History   Socioeconomic History   Marital status: Divorced    Spouse name: Not on file   Number of children: Not on file   Years of education: Not on file   Highest education level: Not on file  Occupational History   Not on file  Tobacco Use   Smoking status: Former    Types: Cigarettes    Quit date: 09/22/2012    Years since quitting: 8.8   Smokeless tobacco: Never  Vaping Use   Vaping Use: Never used  Substance and Sexual Activity   Alcohol use: Yes    Alcohol/week: 1.0 standard drink    Types: 1 Glasses of wine per week    Comment: red wine 1-2 x month   Drug use: No   Sexual activity:  Not Currently  Other Topics Concern   Not on file  Social History Narrative   Lives with daughter (Julie Gilbert    Right handed   Caffeine: none    Social Determinants of Health   Financial Resource Strain: Not on file  Food Insecurity: Not on file  Transportation Needs: Not on file  Physical Activity: Not on file  Stress: Not on file  Social Connections: Not on file     Family History: The patient's family history includes Cancer in her maternal grandfather; Heart disease in her father; Hypertension in her mother; Kidney failure in her mother; Migraines in an other family member.  There is no history of Breast cancer.  ROS:   Please see the history of present illness.    Troubled by migraine headaches.  Was unable to wear the monitor for longer than 2 days because of skin eruption.  She was noted to have rare PACs and PVCs.  All other systems reviewed and are negative.  EKGs/Labs/Other Studies Reviewed:    The following studies were reviewed today:  LONG CARDIAC MONITOR 05/2021: Study Highlights  Normal sinus rhythm Rare PAC's and PVC's  EKG:  EKG not repeated  Recent Labs: 02/11/2021: ALT 8; BUN 18; Creatinine, Ser 1.83; Hemoglobin 12.3; Platelets 180.0; Potassium 3.7; Sodium 141; TSH 2.48  Recent Lipid Panel    Component Value Date/Time   CHOL 355 (H) 02/11/2021 1459   CHOL 160 06/30/2017 1054   TRIG 189.0 (H) 02/11/2021 1459   HDL 71.30 02/11/2021 1459   HDL 49 06/30/2017 1054   CHOLHDL 5 02/11/2021 1459   VLDL 37.8 02/11/2021 1459   LDLCALC 246 (H) 02/11/2021 1459   LDLCALC 94 06/30/2017 1054    Physical Exam:    VS:  BP (!) 142/92   Pulse 73   Ht _0  (1.676 m)   Wt 179 lb 3.2 oz (81.3 kg)   LMP  (LMP Unknown) Comment: Menopausal  SpO2 97%   BMI 28.92 kg/m     Wt Readings from Last 3 Encounters:  08/10/21 179 lb 3.2 oz (81.3 kg)  07/06/21 182 lb 6.4 oz (82.7 kg)  06/28/21 183 lb (83 kg)     GEN: Slightly overweight. No acute distress HEENT:  Normal NECK: No JVD. LYMPHATICS: No lymphadenopathy CARDIAC: No murmur. RRR no gallop, or edema. VASCULAR:  Normal Pulses. No bruits. RESPIRATORY:  Clear to auscultation without rales, wheezing or rhonchi  ABDOMEN: Soft, non-tender, non-distended, No pulsatile mass, MUSCULOSKELETAL: No deformity  SKIN: Warm and dry NEUROLOGIC:  Alert and oriented x 3 PSYCHIATRIC:  Normal affect   ASSESSMENT:    1. Coronary artery disease involving native coronary artery of native heart without angina pectoris   2. Type 2 diabetes mellitus with neurological complications ([place])   3. Dyslipidemia associated with type 2 diabetes mellitus ([place])   4. CKD stage 3 due to type 2 diabetes mellitus ([place])   5. Primary hypertension    PLAN:    In order of problems listed above:  Secondary prevention discussed.  The unintended significant risk factor is hyperlipidemia.  See below.  Encouraged 30 minutes of moderate activity 5 days/week. Hemoglobin A1c was noted to be 5.1 July 06, 2021.  Continue current management that includes diet, exercise, and medication. Start rosuvastatin 40 mg/day with liver and lipid panel in 6 weeks.  Will likely need PCSK9 therapy. Stage IV CKD.  Needs assessment by nephrology and chronic follow-up. Blood pressure target 130/80 mmHg.  Elevated today above where it should be.  At home, both her daughter and patient state the blood pressure is always less than 130/80.  She will continue diet, decrease salt, and exercise.  Prolonged visit related to multiple questions answered and counseling concerning the importance of risk factor modification as a CV prevention tool.  Visit time > 55 minutes   Medication Adjustments/Labs and Tests Ordered: Current medicines are reviewed at length with the patient today.  Concerns regarding medicines are outlined above.  Orders Placed This Encounter  Procedures   Lipid panel   Hepatic function panel   Meds ordered this encounter  Medications    rosuvastatin (CRESTOR) 40 MG  tablet    Sig: Take 1 tablet (40 mg total) by mouth daily.    Dispense:  90 tablet    Refill:  3    Patient Instructions  Medication Instructions:  1) START Rosuvastatin 12m once daily  *If you need a refill on your cardiac medications before your next appointment, please call your pharmacy*   Lab Work: Lipid and Liver in 6 weeks.  You will need to be fasting for these labs (nothing to eat or drink after midnight except water and black coffee).  If you have labs (blood work) drawn today and your tests are completely normal, you will receive your results only by: MMagnet(if you have MyChart) OR A paper copy in the mail If you have any lab test that is abnormal or we need to change your treatment, we will call you to review the results.   Testing/Procedures: None   Follow-Up: [facility] and your [facility] needs are our priority.  As part of our continuing mission to provide you with exceptional heart care, we have created designated Provider Care Teams.  These Care Teams include your primary Cardiologist (physician) and Advanced Practice Providers (APPs -  Physician Assistants and Nurse Practitioners) who all work together to provide you with the care you need, when you need it.  We recommend signing up for the patient portal called "MyChart".  Sign up information is provided on this After Visit Summary.  MyChart is used to connect with patients for Virtual Visits (Telemedicine).  Patients are able to view lab/test results, encounter notes, upcoming appointments, etc.  Non-urgent messages can be sent to your provider as well.   To learn more about what you can do with MyChart, go to [email or web address]    Your next appointment:   6 month(s)  The format for your next appointment:   In Person  Provider:   HSinclair Grooms MD     Other Instructions     Signed, HSinclair Grooms MD  08/10/2021 2:16 PM    [place]

## 2021-08-10 ENCOUNTER — Encounter: Payer: Self-pay | Admitting: Interventional Cardiology

## 2021-08-10 ENCOUNTER — Ambulatory Visit (INDEPENDENT_AMBULATORY_CARE_PROVIDER_SITE_OTHER): Payer: Medicare Other | Admitting: Interventional Cardiology

## 2021-08-10 VITALS — BP 142/92 | HR 73 | Ht 66.0 in | Wt 179.2 lb

## 2021-08-10 DIAGNOSIS — E785 Hyperlipidemia, unspecified: Secondary | ICD-10-CM | POA: Diagnosis not present

## 2021-08-10 DIAGNOSIS — E1122 Type 2 diabetes mellitus with diabetic chronic kidney disease: Secondary | ICD-10-CM

## 2021-08-10 DIAGNOSIS — E1169 Type 2 diabetes mellitus with other specified complication: Secondary | ICD-10-CM | POA: Diagnosis not present

## 2021-08-10 DIAGNOSIS — I10 Essential (primary) hypertension: Secondary | ICD-10-CM

## 2021-08-10 DIAGNOSIS — E1149 Type 2 diabetes mellitus with other diabetic neurological complication: Secondary | ICD-10-CM

## 2021-08-10 DIAGNOSIS — N183 Chronic kidney disease, stage 3 unspecified: Secondary | ICD-10-CM | POA: Diagnosis not present

## 2021-08-10 DIAGNOSIS — I2510 Atherosclerotic heart disease of native coronary artery without angina pectoris: Secondary | ICD-10-CM | POA: Diagnosis not present

## 2021-08-10 MED ORDER — ROSUVASTATIN CALCIUM 40 MG PO TABS: 40.0000 mg | ORAL_TABLET | Freq: Every day | ORAL | 3 refills | Status: DC

## 2021-08-10 NOTE — Patient Instructions (Signed)
Medication Instructions:  1) START Rosuvastatin 40mg  once daily  *If you need a refill on your cardiac medications before your next appointment, please call your pharmacy*   Lab Work: Lipid and Liver in 6 weeks.  You will need to be fasting for these labs (nothing to eat or drink after midnight except water and black coffee).  If you have labs (blood work) drawn today and your tests are completely normal, you will receive your results only by: MyChart Message (if you have MyChart) OR A paper copy in the mail If you have any lab test that is abnormal or we need to change your treatment, we will call you to review the results.   Testing/Procedures: None   Follow-Up: At [facility], you and your health needs are our priority.  As part of our continuing mission to provide you with exceptional heart care, we have created designated Provider Care Teams.  These Care Teams include your primary Cardiologist (physician) and Advanced Practice Providers (APPs -  Physician Assistants and Nurse Practitioners) who all work together to provide you with the care you need, when you need it.  We recommend signing up for the patient portal called "MyChart".  Sign up information is provided on this After Visit Summary.  MyChart is used to connect with patients for Virtual Visits (Telemedicine).  Patients are able to view lab/test results, encounter notes, upcoming appointments, etc.  Non-urgent messages can be sent to your provider as well.   To learn more about what you can do with MyChart, go to [facility].    Your next appointment:   6 month(s)  The format for your next appointment:   In Person  Provider:   [email or web address], MD     Other Instructions

## 2021-08-11 DIAGNOSIS — J310 Chronic rhinitis: Secondary | ICD-10-CM | POA: Diagnosis not present

## 2021-08-11 DIAGNOSIS — H903 Sensorineural hearing loss, bilateral: Secondary | ICD-10-CM | POA: Diagnosis not present

## 2021-08-11 DIAGNOSIS — J343 Hypertrophy of nasal turbinates: Secondary | ICD-10-CM | POA: Diagnosis not present

## 2021-08-11 DIAGNOSIS — H6983 Other specified disorders of Eustachian tube, bilateral: Secondary | ICD-10-CM | POA: Diagnosis not present

## 2021-08-18 NOTE — Addendum Note (Signed)
Addended by: Tabias Swayze E on: 08/18/2021 03:18 PM   Modules accepted: Orders

## 2021-08-23 ENCOUNTER — Telehealth: Payer: Self-pay | Admitting: Interventional Cardiology

## 2021-08-23 NOTE — Telephone Encounter (Signed)
Patient states she is returning Dr. Michaelle Copas call.

## 2021-08-23 NOTE — Telephone Encounter (Signed)
Spoke with pt and she states she received a call from someone last week stating they had been in contact with Dr. Katrinka Blazing.  Advised pt that it looks like the [facility] reached out to Dr. Katrinka Blazing about her kidney function and statin medication.  Advised we added some additional labs to her appt in December and depending on what those showed, we would declare next steps.  Advised he did want her to come in to see the Lipid clinic.  Scheduled her to come in the day after her labs.  Pt in agreement with plan.

## 2021-08-31 ENCOUNTER — Other Ambulatory Visit: Payer: Self-pay | Admitting: Family Medicine

## 2021-09-01 ENCOUNTER — Other Ambulatory Visit: Payer: Self-pay | Admitting: Family Medicine

## 2021-09-02 NOTE — Telephone Encounter (Signed)
Pharmacy note The original prescription was discontinued on 05/20/2021 by Anson Fret, MD. Renewing this prescription may not be appropriate. Not longer our patient

## 2021-09-21 ENCOUNTER — Other Ambulatory Visit: Payer: Medicare Other | Admitting: *Deleted

## 2021-09-21 ENCOUNTER — Other Ambulatory Visit: Payer: Self-pay

## 2021-09-21 DIAGNOSIS — E1169 Type 2 diabetes mellitus with other specified complication: Secondary | ICD-10-CM | POA: Diagnosis not present

## 2021-09-21 DIAGNOSIS — N183 Chronic kidney disease, stage 3 unspecified: Secondary | ICD-10-CM

## 2021-09-21 DIAGNOSIS — I2510 Atherosclerotic heart disease of native coronary artery without angina pectoris: Secondary | ICD-10-CM | POA: Diagnosis not present

## 2021-09-21 DIAGNOSIS — E1122 Type 2 diabetes mellitus with diabetic chronic kidney disease: Secondary | ICD-10-CM | POA: Diagnosis not present

## 2021-09-21 DIAGNOSIS — E785 Hyperlipidemia, unspecified: Secondary | ICD-10-CM | POA: Diagnosis not present

## 2021-09-21 LAB — COMPREHENSIVE METABOLIC PANEL
ALT: 19 IU/L (ref 0–32)
AST: 20 IU/L (ref 0–40)
Albumin/Globulin Ratio: 1.8 (ref 1.2–2.2)
Albumin: 4.4 g/dL (ref 3.8–4.8)
Alkaline Phosphatase: 78 IU/L (ref 44–121)
BUN/Creatinine Ratio: 11 — ABNORMAL LOW (ref 12–28)
BUN: 22 mg/dL (ref 8–27)
Bilirubin Total: 0.3 mg/dL (ref 0.0–1.2)
CO2: 21 mmol/L (ref 20–29)
Calcium: 9.7 mg/dL (ref 8.7–10.3)
Chloride: 109 mmol/L — ABNORMAL HIGH (ref 96–106)
Creatinine, Ser: 2.05 mg/dL — ABNORMAL HIGH (ref 0.57–1.00)
Globulin, Total: 2.4 g/dL (ref 1.5–4.5)
Glucose: 77 mg/dL (ref 70–99)
Potassium: 4.7 mmol/L (ref 3.5–5.2)
Sodium: 145 mmol/L — ABNORMAL HIGH (ref 134–144)
Total Protein: 6.8 g/dL (ref 6.0–8.5)
eGFR: 26 mL/min/{1.73_m2} — ABNORMAL LOW (ref >59)

## 2021-09-21 LAB — LIPID PANEL
Chol/HDL Ratio: 2.8 ratio (ref 0.0–4.4)
Cholesterol, Total: 196 mg/dL (ref 100–199)
HDL: 69 mg/dL (ref >39)
LDL Chol Calc (NIH): 111 mg/dL — ABNORMAL HIGH (ref 0–99)
Triglycerides: 92 mg/dL (ref 0–149)
VLDL Cholesterol Cal: 16 mg/dL (ref 5–40)

## 2021-09-21 LAB — HEPATIC FUNCTION PANEL: Bilirubin, Direct: <0.1 mg/dL (ref 0.00–0.40)

## 2021-09-21 NOTE — Progress Notes (Signed)
I, Julie Gilbert, LAT, ATC acting as a scribe for Julie Graham, MD.  Julie Gilbert is a 70 y.o. female who presents to [facility] at [facility] today for cont bilat knee pain. Pt was last seen by Dr. Denyse Amass on 05/13/21 and was given a bilat knee steroid injection. Today, pt reports she only got about 1 month of relief from prior steroid injections. Swelling present intermittently.  Mechanical symptoms present.  Dx imaging: 04/23/21 L knee   Pertinent review of systems: No fevers or chills  Relevant historical information: Hypertension and diabetes.   Exam:  BP (!) 172/112    Pulse 70    Ht 5\' 6"  (1.676 m)    Wt 184 lb (83.5 kg)    LMP  (LMP Unknown) Comment: Menopausal   SpO2 97%    BMI 29.70 kg/m  General: Well Developed, well nourished, and in no acute distress.   MSK: Right knee minimal effusion normal-appearing otherwise.  Normal motion with crepitation.  Left knee minimal effusion normal-appearing otherwise. Normal motion with crepitation.    Lab and Radiology Results  Procedure: Real-time Ultrasound Guided Injection of right knee superior lateral patellar space Device: Philips Affiniti 50G Images permanently stored and available for review in PACS Verbal informed consent obtained.  Discussed risks and benefits of procedure. Warned about infection bleeding damage to structures skin hypopigmentation and fat atrophy among others. Patient expresses understanding and agreement Time-out conducted.   Noted no overlying erythema, induration, or other signs of local infection.   Skin prepped in a sterile fashion.   Local anesthesia: Topical Ethyl chloride.   With sterile technique and under real time ultrasound guidance: 40 mg of Kenalog and 2 mL of Marcaine injected into knee joint. Fluid seen entering the joint capsule.   Completed without difficulty   Pain immediately resolved suggesting accurate placement of the medication.   Advised to call if  fevers/chills, erythema, induration, drainage, or persistent bleeding.   Images permanently stored and available for review in the ultrasound unit.  Impression: Technically successful ultrasound guided injection.    Procedure: Real-time Ultrasound Guided Injection of left knee superior lateral patellar space Device: Philips Affiniti 50G Images permanently stored and available for review in PACS Verbal informed consent obtained.  Discussed risks and benefits of procedure. Warned about infection bleeding damage to structures skin hypopigmentation and fat atrophy among others. Patient expresses understanding and agreement Time-out conducted.   Noted no overlying erythema, induration, or other signs of local infection.   Skin prepped in a sterile fashion.   Local anesthesia: Topical Ethyl chloride.   With sterile technique and under real time ultrasound guidance: 40 mg of Kenalog and 2 mL of Marcaine injected into knee joint. Fluid seen entering the joint capsule.   Completed without difficulty   Pain immediately resolved suggesting accurate placement of the medication.   Advised to call if fevers/chills, erythema, induration, drainage, or persistent bleeding.   Images permanently stored and available for review in the ultrasound unit.  Impression: Technically successful ultrasound guided injection.   X-ray images right knee obtained today personally and independently interpreted Mild DJD.  No acute fractures. Await formal radiology review   EXAM: LEFT KNEE - 1-2 VIEW   COMPARISON:  None.   FINDINGS: No joint effusion or fracture. Medial and patellofemoral compartment osteophytosis. Mild peaking of the tibial spines.   IMPRESSION: 1. No acute findings. 2. Mild degenerative changes.     Electronically Signed   By:  M.D.   On: 04/26/2021 14:12 I, Julie Gilbert, personally (independently) visualized and performed the interpretation of the images attached in this  note.      Assessment and Plan: 71 y.o. female with bilateral knee pain thought to be due to DJD.  Plan for bilateral steroid injection.  Previous injection did not last 3 months.  We will repeat injection today but work on authorization for hyaluronic acid injections.  Also recommend quad strengthening.   PDMP not reviewed this encounter. Orders Placed This Encounter  Procedures   [place] LIMITED JOINT SPACE STRUCTURES LOW BILAT(NO LINKED CHARGES)    Standing Status:   Future    Number of Occurrences:   1    Standing Expiration Date:   03/23/2022    Order Specific Question:   Reason for Exam (SYMPTOM  OR DIAGNOSIS REQUIRED)    Answer:   bilateral knee pain    Order Specific Question:   Preferred imaging location?    Answer:   [facility] Knee AP/LAT W/[place] Right    Standing Status:   Future    Number of Occurrences:   1    Standing Expiration Date:   09/22/2022    Order Specific Question:   Reason for Exam (SYMPTOM  OR DIAGNOSIS REQUIRED)    Answer:   right knee pain    Order Specific Question:   Preferred imaging location?    Answer:   [facility]   No orders of the defined types were placed in this encounter.    Discussed warning signs or symptoms. Please see discharge instructions. Patient expresses understanding.   The above documentation has been reviewed and is accurate and complete Julie Gilbert, M.D.

## 2021-09-22 ENCOUNTER — Ambulatory Visit (INDEPENDENT_AMBULATORY_CARE_PROVIDER_SITE_OTHER): Payer: Medicare Other

## 2021-09-22 ENCOUNTER — Other Ambulatory Visit: Payer: Self-pay

## 2021-09-22 ENCOUNTER — Ambulatory Visit: Payer: Self-pay

## 2021-09-22 ENCOUNTER — Ambulatory Visit (INDEPENDENT_AMBULATORY_CARE_PROVIDER_SITE_OTHER): Payer: Medicare Other | Admitting: Pharmacist

## 2021-09-22 ENCOUNTER — Ambulatory Visit (INDEPENDENT_AMBULATORY_CARE_PROVIDER_SITE_OTHER): Payer: Medicare Other | Admitting: Family Medicine

## 2021-09-22 VITALS — BP 172/112 | HR 70 | Ht 66.0 in | Wt 184.0 lb

## 2021-09-22 DIAGNOSIS — E785 Hyperlipidemia, unspecified: Secondary | ICD-10-CM | POA: Diagnosis not present

## 2021-09-22 DIAGNOSIS — M25561 Pain in right knee: Secondary | ICD-10-CM | POA: Diagnosis not present

## 2021-09-22 DIAGNOSIS — G8929 Other chronic pain: Secondary | ICD-10-CM

## 2021-09-22 DIAGNOSIS — M25562 Pain in left knee: Secondary | ICD-10-CM

## 2021-09-22 DIAGNOSIS — M1712 Unilateral primary osteoarthritis, left knee: Secondary | ICD-10-CM | POA: Diagnosis not present

## 2021-09-22 DIAGNOSIS — E1169 Type 2 diabetes mellitus with other specified complication: Secondary | ICD-10-CM | POA: Diagnosis not present

## 2021-09-22 DIAGNOSIS — M170 Bilateral primary osteoarthritis of knee: Secondary | ICD-10-CM

## 2021-09-22 NOTE — Patient Instructions (Addendum)
Thank you for coming in today.   Please get an Xray today before you leave   You received an injection today. Seek immediate medical attention if the joint becomes red, extremely painful, or is oozing fluid.   Let us know when your knees start hurting again and we can authorize the gel shots

## 2021-09-22 NOTE — Patient Instructions (Addendum)
Stop taking diclofenac I will submit a prior authorization for Repatha I will call you once its approved.  Call me at [phone] with any questions

## 2021-09-22 NOTE — Progress Notes (Signed)
Patient ID: Julie Gilbert                 DOB: 12-Nov-1950                    MRN: [id number]     HPI: Julie Gilbert is a 70 y.o. female patient referred to lipid clinic by [facility]. PMH is significant for  CAD, hypertension, type 2 diabetes, transient ischemic attack, hyperlipidemia, asthma and CKD stage III. Statin was discontinued for unknown reasons. LDL-C was 246. Rosuvastatin 40mg  daily was started by Dr. Tamala Julian on 11/15. Repeat lipid panel showed an LDL-C of 111.  Patient presents today to lipid clinic to discuss PCSK9i. Patient has a lot of social stressors. She feels as though her grown daughter just take from her. Her one daughter lives with her and she feels like she has no privacy. States she needs to find a therapist. Wants to move out of her apartment. Toilets are too low and feels like it will become an unsafe place to live. Doesn't exercise except leg raises because of knee pain. Is getting a cortisone shot in her knees today. Taking diclofenac.   Current Medications: rosuvastatin 40mg  daily Risk Factors: CVA, CAD, DM, HTN LDL goal: <55  Diet: does eat much meat  Exercise: not much bc of knees, does leg lifts, crunches, has fallen several time  Family History: The patient's family history includes Cancer in her maternal grandfather; Heart disease in her father; Hypertension in her mother; Kidney failure in her mother; Migraines in an other family member. There is no history of Breast cancer.  Social History:  Social History   Socioeconomic History   Marital status: Divorced    Spouse name: Not on file   Number of children: Not on file   Years of education: Not on file   Highest education level: Not on file  Occupational History   Not on file  Tobacco Use   Smoking status: Former    Types: Cigarettes    Quit date: 09/22/2012    Years since quitting: 9.0   Smokeless tobacco: Never  Vaping Use   Vaping Use: Never used  Substance and Sexual Activity   Alcohol use: Yes     Alcohol/week: 1.0 standard drink    Types: 1 Glasses of wine per week    Comment: red wine 1-2 x month   Drug use: No   Sexual activity: Not Currently  Other Topics Concern   Not on file  Social History Narrative   Lives with daughter Julie Gilbert)    Right handed   Caffeine: none    Social Determinants of Health   Financial Resource Strain: Not on file  Food Insecurity: Not on file  Transportation Needs: Not on file  Physical Activity: Not on file  Stress: Not on file  Social Connections: Not on file  Intimate Partner Violence: Not on file     Labs: 09/21/21 TC 196, TG 92, HDL 69, LDL-C 111  Past Medical History:  Diagnosis Date   Anxiety 05/18/2018   Asthma    Chronic anemia 06/29/2016   Chronic pain of both knees 02/15/2019   CKD stage 3 due to type 2 diabetes mellitus ([place]) 05/18/2018   Coronary artery disease 10/01/2018   Diabetes mellitus without complication ([place])    Dyslipidemia associated with type 2 diabetes mellitus ([place]) 05/18/2018   Lab Results Component Value Date  CHOL 198 08/17/2018  HDL 62.40 08/17/2018  LDLCALC 113 (H)  08/17/2018  TRIG 113.0 08/17/2018  CHOLHDL 3 08/17/2018     Gastroesophageal reflux disease without esophagitis 06/18/2015   Herniated intervertebral disc of lumbar spine 05/01/2017   Hypertension    Hypertension associated with diabetes (HCC) 05/13/2015   IBS (irritable bowel syndrome)    Insomnia 05/13/2015   Migraine headache 05/13/2015   Moderate episode of recurrent major depressive disorder (HCC) 01/04/2017   Osteoarthritis of spine 02/03/2017   Transient ischemic attack (TIA) 06/28/2016   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   Lab Results Component Value Date  HGBA1C 5.9 08/17/2018  HGBA1C 5.8 01/04/2017  HGBA1C 6.4 (H) 06/29/2016  Lab Results Component Value Date  MICROALBUR 10.3 (H) 08/17/2018  LDLCALC 113 (H) 08/17/2018  CREATININE 1.56 (H) 08/17/2018      Current Outpatient Medications on File  Prior to Visit  Medication Sig Dispense Refill   Azelastine-Fluticasone 137-50 MCG/ACT SUSP PLACE 1 SPRAY INTO THE NOSE EVERY 12 (TWELVE) HOURS. 23 g 5   albuterol (VENTOLIN HFA) 108 (90 Base) MCG/ACT inhaler TAKE 2 PUFFS BY MOUTH EVERY 6 HOURS AS NEEDED FOR WHEEZE OR SHORTNESS OF BREATH 6.7 each 6   Ascorbic Acid (VITAMIN C) 1000 MG tablet Take 1,000 mg by mouth daily.     aspirin 81 MG tablet Take 81 mg by mouth daily.     Blood Glucose Monitoring Suppl (ACCU-CHEK AVIVA PLUS) w/Device KIT Use as directed to check blood sugar once daily. E11.9 1 kit 0   Blood Pressure Monitoring (BLOOD PRESSURE KIT) DEVI 1 application by Does not apply route daily. Use to check blood pressure daily (Patient not taking: Reported on 08/10/2021) 1 Device 0   Calcium Carbonate-Vitamin D 600-125 MG-UNIT TABS Take 600 Units by mouth daily.     Cholecalciferol 1000 units tablet Take 1,000 Units by mouth daily.     clonazePAM (KLONOPIN) 1 MG tablet TAKE 1 TABLET BY MOUTH THREE TIMES A DAY 270 tablet 1   COD LIVER OIL PO Take 500 Units by mouth daily.     [employer] OLOPATADINE HCL 0.2 % SOLN APPLY TO AFFECTED AREA EVERY DAY 2.5 mL 0   DEXILANT 60 MG capsule Take 1 capsule by mouth daily.     diclofenac (VOLTAREN) 75 MG EC tablet TAKE 1 TABLET BY MOUTH TWICE A DAY 60 tablet 0   docusate sodium (COLACE) 100 MG capsule Take 1 capsule (100 mg total) by mouth 2 (two) times daily. 10 capsule 0   EMGALITY 120 MG/ML SOAJ Inject 120 ml monthly. 1.12 mL 11   escitalopram (LEXAPRO) 10 MG tablet Take 1 tablet (10 mg total) by mouth daily. 90 tablet 3   Fish Oil-Cholecalciferol (FISH OIL + D3) 1000-1000 MG-UNIT CAPS Take 1 tablet by mouth daily.     gabapentin (NEURONTIN) 300 MG capsule Take 1 capsule (300 mg total) by mouth at bedtime. 90 capsule 3   glucose blood (ACCU-CHEK AVIVA PLUS) test strip Use as instructed to check blood sugar once daily. E11.9 100 each 12   Lancets (ACCU-CHEK SOFT TOUCH) lancets Use as instructed to check blood  sugar once daily E11.9 100 each 12   losartan (COZAAR) 25 MG tablet Take 1 tablet (25 mg total) by mouth daily. 90 tablet 3   MAGNESIUM PO Take 840 mg by mouth daily. 1 1/2 daily (Patient not taking: Reported on 08/10/2021)     meclizine (ANTIVERT) 25 MG tablet Take 1 tablet (25 mg total) by mouth 3 (three) times daily. 180 tablet 3  mirtazapine (REMERON) 7.5 MG tablet TAKE 1 TABLET BY MOUTH AT BEDTIME. (Patient not taking: Reported on 08/10/2021) 90 tablet 2   Multiple Vitamin (MULTIVITAMIN WITH MINERALS) TABS tablet Take 1 tablet by mouth daily.     nitroGLYCERIN (NITROSTAT) 0.4 MG SL tablet PLACE 1 TABLET (0.4 MG TOTAL) UNDER THE TONGUE EVERY 5 (FIVE) MINUTES AS NEEDED FOR CHEST PAIN. 25 tablet 10   OVER THE COUNTER MEDICATION 6.25 mg daily. [place]     pantoprazole (PROTONIX) 40 MG tablet TAKE 1 TABLET BY MOUTH EVERY DAY (Patient not taking: Reported on 08/10/2021) 90 tablet 1   promethazine (PHENERGAN) 12.5 MG tablet Take 1 tablet (12.5 mg total) by mouth every 8 (eight) hours as needed for nausea or vomiting. (Patient not taking: Reported on 08/10/2021) 20 tablet 0   Riboflavin (VITAMIN B-2 PO) Take 250 mg by mouth daily.     rosuvastatin (CRESTOR) 40 MG tablet Take 1 tablet (40 mg total) by mouth daily. 90 tablet 3   SYMBICORT 160-4.5 MCG/ACT inhaler TAKE 2 PUFFS BY MOUTH TWICE A DAY 30.6 each 3   topiramate (TOPAMAX) 50 MG tablet Take 1 tablet (50 mg total) by mouth 2 (two) times daily. 180 tablet 4   Ubrogepant (UBRELVY) 100 MG TABS Take 100 mg by mouth every 2 (two) hours as needed. Maximum 200mg  a day. 16 tablet 11   vitamin B-12 (CYANOCOBALAMIN) 100 MCG tablet Take 100 mcg by mouth daily.     No current facility-administered medications on file prior to visit.    Allergies  Allergen Reactions   Peanuts [Peanut Oil] Anaphylaxis   Shrimp [Shellfish Allergy] Anaphylaxis   Latex Hives   Elemental Sulfur Itching and Swelling   Other     Adhesive on cardiac monitor irritated skin    Penicillins Hives    Has patient had a PCN reaction causing immediate rash, facial/tongue/throat swelling, SOB or lightheadedness with hypotension: No Has patient had a PCN reaction causing severe rash involving mucus membranes or skin necrosis: No Has patient had a PCN reaction that required hospitalization: No Has patient had a PCN reaction occurring within the last 10 years: No  If all of the above answers are "NO", then may proceed with Cephalosporin use.     Pineapple     Scratchy throat, Tongue swelling   Sulfa Antibiotics     Assessment/Plan:  1. Hyperlipidemia - LDL is above goal of <55. Patient is agreeable to starting PCSK9i. I reviewed injection technique with patient. She is dual medicare/medicaid, therefore should be affordable. Continue rosuvastatin 40mg  daily for now. May need to decrease dose if kidney function does not improve with stopping NSAIDS.  2. CKD - Kidney function continues to decline. I have asked her to stop taking diclofenac. If Kidney function does not improve, will need to adjust rosuvastatin dose down to 10mg  or change to atorvastatin 40mg . Should see nephrologist. Will defer to PCP.   3. Decreased mood/stress- Advised patient see a therapist. She has not found one she likes yet. Wants to do virtual visits.   Thank you,   Ramond Dial, Pharm.D, BCPS, CPP [place]  [address] [place], [place] [address]  Phone: [phone]; Fax: [phone]

## 2021-09-23 ENCOUNTER — Telehealth: Payer: Self-pay | Admitting: Pharmacist

## 2021-09-23 DIAGNOSIS — E1122 Type 2 diabetes mellitus with diabetic chronic kidney disease: Secondary | ICD-10-CM

## 2021-09-23 DIAGNOSIS — I2510 Atherosclerotic heart disease of native coronary artery without angina pectoris: Secondary | ICD-10-CM

## 2021-09-23 MED ORDER — REPATHA SURECLICK 140 MG/ML ~~LOC~~ SOAJ: 1.0000 "pen " | SUBCUTANEOUS | 11 refills | Status: DC

## 2021-09-23 NOTE — Telephone Encounter (Signed)
Repatha approved through 03/23/22. Left VM for patient to call back. Rx sent to pharmacy.

## 2021-09-24 NOTE — Telephone Encounter (Signed)
Spoke with patient. Advised medication was approved and should be ready for pick up at pharmacy soon. Scheduled repeat lipids in 3 months. She will come 1/9 for repeat BMP to reassess kidney function and rosuvastatin dosing. She said she stopped the dicofenac.

## 2021-09-24 NOTE — Progress Notes (Signed)
Right knee x-ray shows some arthritis changes in the knee.

## 2021-09-30 ENCOUNTER — Telehealth: Payer: Self-pay

## 2021-09-30 NOTE — Telephone Encounter (Signed)
Left pt a VM informing her she's been approved for gel shots and to call the office to schedule whenever he knees start hurting again.

## 2021-10-04 ENCOUNTER — Other Ambulatory Visit: Payer: Commercial Managed Care - HMO | Admitting: *Deleted

## 2021-10-04 ENCOUNTER — Other Ambulatory Visit: Payer: Self-pay

## 2021-10-04 DIAGNOSIS — E1122 Type 2 diabetes mellitus with diabetic chronic kidney disease: Secondary | ICD-10-CM | POA: Diagnosis not present

## 2021-10-04 DIAGNOSIS — N183 Chronic kidney disease, stage 3 unspecified: Secondary | ICD-10-CM | POA: Diagnosis not present

## 2021-10-04 LAB — BASIC METABOLIC PANEL
BUN/Creatinine Ratio: 12 (ref 12–28)
BUN: 23 mg/dL (ref 8–27)
CO2: 19 mmol/L — ABNORMAL LOW (ref 20–29)
Calcium: 9 mg/dL (ref 8.7–10.3)
Chloride: 109 mmol/L — ABNORMAL HIGH (ref 96–106)
Creatinine, Ser: 1.87 mg/dL — ABNORMAL HIGH (ref 0.57–1.00)
Glucose: 92 mg/dL (ref 70–99)
Potassium: 4.6 mmol/L (ref 3.5–5.2)
Sodium: 140 mmol/L (ref 134–144)
eGFR: 29 mL/min/{1.73_m2} — ABNORMAL LOW (ref >59)

## 2021-10-05 ENCOUNTER — Telehealth: Payer: Self-pay | Admitting: Pharmacist

## 2021-10-05 ENCOUNTER — Ambulatory Visit: Payer: Commercial Managed Care - HMO | Attending: Internal Medicine | Admitting: Internal Medicine

## 2021-10-05 NOTE — Telephone Encounter (Signed)
Scr improved after stopping diclofenac. Crcl is now 29ml/min ok to stay of rosuvastatin 40mg . LVM for pt to call back and discuss.

## 2021-10-06 MED ORDER — ATORVASTATIN CALCIUM 40 MG PO TABS: 40.0000 mg | ORAL_TABLET | Freq: Every day | ORAL | 3 refills | Status: DC

## 2021-10-06 NOTE — Telephone Encounter (Signed)
Belva Crome, MD  10/02/2021 12:32 PM EST     Let the patient know please refer to Lipid clinic for LDL < 70 and preferably< 55. Refer to Nephrology for CKD stage 4. A copy will be sent to Ladell Pier, MD   Called patient in regards to lab results. She is already followed by lipid clinic.  Advised patient on recommendations from [facility], [facility] to change rosuvastatin to atorvastatin 40 mg daily. Patient is in agreement with plan.

## 2021-10-06 NOTE — Telephone Encounter (Signed)
After further thought, with patients scr so close to the cut off for high dose rosuvastatin, will change rosuvastatin to atorvastatin 40mg  daily.  Tried to call patient. Call went right to voicemail.

## 2021-10-07 ENCOUNTER — Ambulatory Visit: Payer: Medicare Other | Admitting: Sports Medicine

## 2021-10-07 ENCOUNTER — Telehealth: Payer: Self-pay | Admitting: Neurology

## 2021-10-07 ENCOUNTER — Telehealth (INDEPENDENT_AMBULATORY_CARE_PROVIDER_SITE_OTHER): Payer: Self-pay | Admitting: Neurology

## 2021-10-07 DIAGNOSIS — Z91199 Patient's noncompliance with other medical treatment and regimen due to unspecified reason: Secondary | ICD-10-CM

## 2021-10-07 NOTE — Progress Notes (Signed)
NO SHOW

## 2021-10-07 NOTE — Telephone Encounter (Signed)
Patient no-showed her f/u appointment today.  She has an 11% no-show rate in epic.  If patient calls back please have a talk with her, we have a lot of patients awaiting appointments and its really not fair to them when people just do not show up, please ask her not to make an appointment unless she intends on coming.  Something could have happened today and in that case we totally understand and we are happy to see her back but if she no-shows again she may be dismissed from our practice.

## 2021-10-11 ENCOUNTER — Encounter: Payer: Medicare Other | Admitting: Family Medicine

## 2021-10-11 ENCOUNTER — Encounter: Payer: Self-pay | Admitting: Neurology

## 2021-10-19 ENCOUNTER — Ambulatory Visit: Payer: Medicaid Other | Attending: Internal Medicine | Admitting: Internal Medicine

## 2021-10-19 ENCOUNTER — Telehealth: Payer: Self-pay | Admitting: Neurology

## 2021-10-19 ENCOUNTER — Ambulatory Visit (INDEPENDENT_AMBULATORY_CARE_PROVIDER_SITE_OTHER): Payer: Medicare Other | Admitting: Neurology

## 2021-10-19 ENCOUNTER — Encounter: Payer: Self-pay | Admitting: Neurology

## 2021-10-19 VITALS — BP 152/93 | HR 59 | Ht 67.0 in | Wt 180.4 lb

## 2021-10-19 DIAGNOSIS — G43909 Migraine, unspecified, not intractable, without status migrainosus: Secondary | ICD-10-CM

## 2021-10-19 DIAGNOSIS — Z91199 Patient's noncompliance with other medical treatment and regimen due to unspecified reason: Secondary | ICD-10-CM

## 2021-10-19 DIAGNOSIS — G43709 Chronic migraine without aura, not intractable, without status migrainosus: Secondary | ICD-10-CM | POA: Diagnosis not present

## 2021-10-19 MED ORDER — EMGALITY 120 MG/ML ~~LOC~~ SOAJ: SUBCUTANEOUS | 11 refills | Status: DC

## 2021-10-19 MED ORDER — TOPIRAMATE 50 MG PO TABS: 50.0000 mg | ORAL_TABLET | Freq: Two times a day (BID) | ORAL | 4 refills | Status: DC

## 2021-10-19 NOTE — Telephone Encounter (Signed)
Will you set her up with megan or amy in 6 months please? Thanks!

## 2021-10-19 NOTE — Progress Notes (Signed)
[facility]    Provider:  Dr Jaynee Eagles Requesting Provider: Vivi Barrack, MD Primary Care Provider:  Ladell Pier, MD  CC:  Migraines  10/19/2021: Emgality has been working great. Roselyn Meier made her sick. She has been doing excellent on the [employer]. She was a little dizzy on standing, her BP slightly elevated, we had to get her a wheelchair. Today she feels bad and has been feeling bad since the repatha. Roselyn Meier does not work when she has a migraine. She stayed on her Topamax and she was doing well. Patient not feeling well after taking repatha, looks ill today, says she has been feeling unwell since taking it, her BP is slightly elevated, she appears to be tremulous, I called [facility] and they have an 8 hour wait. [facility] and no wait time but I was able to call her pcp Dr. Wynetta Emery and got her an appointment at 330. Daughter is here as well and provides information, she has not been feeling well since Repatha. She has been doing fantastic on Emgality, barely 2 total headaches a month  HPI 05/20/2021:  ANILAH HUCK is a 71 y.o. female here as requested by Vivi Barrack, MD for migraines. PMHx migraine, anxiety, asthma, chronic kidney disease, coronary artery disease, diabetes, dyslipidemia, hypertension, depression, osteoarthritis, lacunar stroke, B12 deficiency, vertigo. Here with dauhter who also provides information. She is doing a lot better with her migraines. She has a cardiologist. She is on a heart monitor. Her migraines cn be severe in the left side back and side, pulsating/pounding/throbbing/, pulsating/pounding/throbbing, photophobia/phonophobia, most severe pain is the back in the left, the Emgality helps a lot, she is also on Topamax. Extremely severe. Nausea, she would pass out it was so severe. Used to have daily headaches, weather can be a trigger, a lot less maybe about 6 a month, was not able to get the [place].  Mother had tremors. With action and  posture. Order Roselyn Meier for acute management. Preventative Topamax and emgality. Discussed botox.No other focal neurologic deficits, associated symptoms, inciting events or modifiable factors.  Reviewed notes, labs and imaging from outside physicians, which showed:  From a thorough review of records, medications tried that can be used in migraine management include: Amlodipine (calcium channel blocker similar to verapamil), aspirin, Fioricet, Voltaren tablet, Benadryl, Emgality, Lexapro, gabapentin, meclizine, melatonin, Reglan injections, naproxen, Zofran, prednisone tablets, Phenergan tablets, Imitrex, Topamax, trazodone, propranolol contraindicated due to asthma, amitriptyline/nortrio contraindicated due to being on lexapro, TRIPTANS CONTRAINDICATED due to strokes  B12 > 1550 Vit d 73 Hgba1c 6   MRI brain 11/2017: Brain: reviewed images and agree: Stable cerebral volume since 2017. No restricted diffusion to suggest acute infarction. No midline shift, mass effect, evidence of mass lesion, ventriculomegaly, extra-axial collection or acute intracranial hemorrhage. Cervicomedullary junction and pituitary are within normal limits.   Small chronic lacunar infarcts in the right caudate and left cerebellum are stable since 2017. T2 heterogeneity elsewhere in the deep gray matter nuclei is stable and appears largely due to increased perivascular spaces. The brainstem and right cerebellum remain normal.   Minimal to mild for age scattered nonspecific cerebral white matter T2 and FLAIR hyperintensity is stable. A chronic microhemorrhage in the right anterior subinsular white matter is stable since 2017, and is located near a developmental venous anomaly (normal variant). A small chronic microhemorrhage in the left parietal lobe subcortical white matter on series 13, image 62 may be new since that time. No other chronic cerebral blood products identified.  No abnormal enhancement identified.   No dural thickening   Vascular: Major intracranial vascular flow voids are stable since 2017. Mildly dominant distal right vertebral artery with mild generalized intracranial artery tortuosity, and possible fetal type PCA origins. The major dural venous sinuses are enhancing and appear patent.   Skull and upper cervical spine: Negative visible cervical spine and spinal cord. Normal bone marrow signal. Incidental hyperostosis of the calvarium (normal variant).   Sinuses/Orbits: Orbits soft tissues appear stable and normal. Paranasal Visualized paranasal sinuses and mastoids are stable and well pneumatized.   Other: Visible internal auditory structures appear normal. Scalp and face soft tissues appear negative.   IMPRESSION: 1.  No acute intracranial abnormality. 2. 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    Review of Systems: Patient complains of symptoms per HPI as well as the following symptoms chest pain. Pertinent negatives and positives per HPI. All others negative.   Social History   Socioeconomic History   Marital status: Divorced    Spouse name: Not on file   Number of children: Not on file   Years of education: Not on file   Highest education level: Not on file  Occupational History   Not on file  Tobacco Use   Smoking status: Former    Types: Cigarettes    Quit date: 09/22/2012    Years since quitting: 9.0   Smokeless tobacco: Never  Vaping Use   Vaping Use: Never used  Substance and Sexual Activity   Alcohol use: Yes    Alcohol/week: 1.0 standard drink    Types: 1 Glasses of wine per week    Comment: red wine 1-2 x month   Drug use: No   Sexual activity: Not Currently  Other Topics Concern   Not on file  Social History Narrative   Lives with daughter Terrea Bruster)    Right handed   Caffeine: none    Social Determinants of Health   Financial Resource  Strain: Not on file  Food Insecurity: Not on file  Transportation Needs: Not on file  Physical Activity: Not on file  Stress: Not on file  Social Connections: Not on file  Intimate Partner Violence: Not on file    Family History  Problem Relation Age of Onset   Kidney failure Mother    Hypertension Mother    Heart disease Father    Cancer Maternal Grandfather    Migraines Other    Breast cancer Neg Hx     Past Medical History:  Diagnosis Date   Anxiety 05/18/2018   Asthma    Chronic anemia 06/29/2016   Chronic pain of both knees 02/15/2019   CKD stage 3 due to type 2 diabetes mellitus ([place]) 05/18/2018   Coronary artery disease 10/01/2018   Diabetes mellitus without complication ([place])    Dyslipidemia associated with type 2 diabetes mellitus ([place]) 05/18/2018   Lab Results Component Value Date  CHOL 198 08/17/2018  HDL 62.40 08/17/2018  LDLCALC 113 (H) 08/17/2018  TRIG 113.0 08/17/2018  CHOLHDL 3 08/17/2018     Gastroesophageal reflux disease without esophagitis 06/18/2015   Herniated intervertebral disc of lumbar spine 05/01/2017   Hypertension    Hypertension associated with diabetes ([place]) 05/13/2015   IBS (irritable bowel syndrome)    Insomnia 05/13/2015   Migraine headache 05/13/2015   Moderate episode of recurrent major depressive disorder ([place]) 01/04/2017   Osteoarthritis of spine 02/03/2017  Transient ischemic attack (TIA) 06/28/2016   Type 2 diabetes mellitus with neurological complications ([place]) 3/00/9233   Lab Results Component Value Date  HGBA1C 5.9 08/17/2018  HGBA1C 5.8 01/04/2017  HGBA1C 6.4 (H) 06/29/2016  Lab Results Component Value Date  MICROALBUR 10.3 (H) 08/17/2018  LDLCALC 113 (H) 08/17/2018  CREATININE 1.56 (H) 08/17/2018      Patient Active Problem List   Diagnosis Date Noted   Chronic migraine without aura without status migrainosus, not intractable 05/20/2021   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   Allergic rhinitis 02/11/2021   Vertigo 02/27/2020   B12  deficiency 02/11/2020   Iron deficiency 02/11/2020   Inflamed seborrheic keratosis 01/30/2020   Asthma 06/27/2019   Chronic pain of both knees 02/15/2019   Coronary artery disease 10/01/2018   Dyslipidemia associated with type 2 diabetes mellitus ([place]) 05/18/2018   Anxiety 05/18/2018   CKD stage 3 due to type 2 diabetes mellitus ([place]) 05/18/2018   Chest pain syndrome 10/27/2017   Herniated intervertebral disc of lumbar spine 05/01/2017   Osteoarthritis of spine 02/03/2017   Moderate episode of recurrent major depressive disorder ([place]) 01/04/2017   Chronic anemia 06/29/2016   Gastroesophageal reflux disease without esophagitis 06/18/2015   Hypertension associated with diabetes ([place]) 05/13/2015   Type 2 diabetes mellitus with neurological complications ([place]) 00/76/2263   IBS (irritable bowel syndrome) 05/13/2015   Migraine headache 05/13/2015   Insomnia 05/13/2015    Past Surgical History:  Procedure Laterality Date   CESAREAN SECTION     EXPLORATORY LAPAROTOMY      Current Outpatient Medications  Medication Sig Dispense Refill   albuterol (VENTOLIN HFA) 108 (90 Base) MCG/ACT inhaler TAKE 2 PUFFS BY MOUTH EVERY 6 HOURS AS NEEDED FOR WHEEZE OR SHORTNESS OF BREATH 6.7 each 6   Ascorbic Acid (VITAMIN C) 1000 MG tablet Take 1,000 mg by mouth daily.     aspirin 81 MG tablet Take 81 mg by mouth daily.     atorvastatin (LIPITOR) 40 MG tablet Take 1 tablet (40 mg total) by mouth daily. 90 tablet 3   Azelastine-Fluticasone 137-50 MCG/ACT SUSP PLACE 1 SPRAY INTO THE NOSE EVERY 12 (TWELVE) HOURS. 23 g 5   Blood Glucose Monitoring Suppl (ACCU-CHEK AVIVA PLUS) w/Device KIT Use as directed to check blood sugar once daily. E11.9 1 kit 0   Blood Pressure Monitoring (BLOOD PRESSURE KIT) DEVI 1 application by Does not apply route daily. Use to check blood pressure daily 1 Device 0   Cholecalciferol 1000 units tablet Take 1,000 Units by mouth daily.     clonazePAM (KLONOPIN) 1 MG tablet TAKE 1  TABLET BY MOUTH THREE TIMES A DAY 270 tablet 1   COD LIVER OIL PO Take 500 Units by mouth daily.     [employer] OLOPATADINE HCL 0.2 % SOLN APPLY TO AFFECTED AREA EVERY DAY 2.5 mL 0   DEXILANT 60 MG capsule Take 1 capsule by mouth daily.     diclofenac (VOLTAREN) 75 MG EC tablet TAKE 1 TABLET BY MOUTH TWICE A DAY 60 tablet 0   escitalopram (LEXAPRO) 10 MG tablet Take 1 tablet (10 mg total) by mouth daily. 90 tablet 3   Evolocumab (REPATHA SURECLICK) 335 MG/ML SOAJ Inject 1 pen into the skin every 14 (fourteen) days. 2 mL 11   Fish Oil-Cholecalciferol (FISH OIL + D3) 1000-1000 MG-UNIT CAPS Take 1 tablet by mouth daily.     glucose blood (ACCU-CHEK AVIVA PLUS) test strip Use as instructed to check blood sugar once daily. E11.9 100 each  12   Lancets (ACCU-CHEK SOFT TOUCH) lancets Use as instructed to check blood sugar once daily E11.9 100 each 12   losartan (COZAAR) 25 MG tablet Take 1 tablet (25 mg total) by mouth daily. 90 tablet 3   meclizine (ANTIVERT) 25 MG tablet Take 1 tablet (25 mg total) by mouth 3 (three) times daily. 180 tablet 3   Multiple Vitamin (MULTIVITAMIN WITH MINERALS) TABS tablet Take 1 tablet by mouth daily.     nitroGLYCERIN (NITROSTAT) 0.4 MG SL tablet PLACE 1 TABLET (0.4 MG TOTAL) UNDER THE TONGUE EVERY 5 (FIVE) MINUTES AS NEEDED FOR CHEST PAIN. 25 tablet 10   OVER THE COUNTER MEDICATION 6.25 mg daily. [place]     promethazine (PHENERGAN) 12.5 MG tablet Take 1 tablet (12.5 mg total) by mouth every 8 (eight) hours as needed for nausea or vomiting. 20 tablet 0   Riboflavin (VITAMIN B-2 PO) Take 250 mg by mouth daily.     SYMBICORT 160-4.5 MCG/ACT inhaler TAKE 2 PUFFS BY MOUTH TWICE A DAY 30.6 each 3   vitamin B-12 (CYANOCOBALAMIN) 100 MCG tablet Take 100 mcg by mouth daily.     EMGALITY 120 MG/ML SOAJ Inject 120 ml monthly. 1.12 mL 11   topiramate (TOPAMAX) 50 MG tablet Take 1 tablet (50 mg total) by mouth 2 (two) times daily. 180 tablet 4   No current facility-administered  medications for this visit.    Allergies as of 10/19/2021 - Review Complete 10/19/2021  Allergen Reaction Noted   Peanuts [peanut oil] Anaphylaxis 04/08/2015   Shrimp [shellfish allergy] Anaphylaxis 04/08/2015   Latex Hives 04/08/2015   Elemental sulfur Itching and Swelling 01/30/2020   Other  04/08/2015   Penicillins Hives 09/27/2017   Pineapple  05/01/2017   Sulfa antibiotics  02/11/2020    Vitals: BP (!) 152/93    Pulse (!) 59    Ht _0  (1.702 m)    Wt 180 lb 6.4 oz (81.8 kg)    LMP  (LMP Unknown) Comment: Menopausal   BMI 28.25 kg/m  Last Weight:  Wt Readings from Last 1 Encounters:  10/19/21 180 lb 6.4 oz (81.8 kg)   Last Height:   Ht Readings from Last 1 Encounters:  10/19/21 _1  (1.702 m)   Exam: Not feeling well today, dizzy, had to use a wheelchair to get her to her car, pleasant                  Speech:    Speech is normal; fluent and spontaneous with normal comprehension.  Cognition:    The patient is oriented to person, place, and time;     recent and remote memory intact;     language fluent;    Cranial Nerves:    The pupils are equal, round, and reactive to light.Trigeminal sensation is intact and the muscles of mastication are normal. The face is symmetric. The palate elevates in the midline. Hearing intact. Voice is normal. Shoulder shrug is normal. The tongue has normal motion without fasciculations.   Coordination:  No dysmetria  Motor Observation:    No asymmetry, no atrophy, and no involuntary movements noted. Tone:    Normal muscle tone.     Strength:    Strength is V/V in the upper and lower limbs.      Sensation: intact to LT     Assessment/Plan:  Chronic migraine without aura doing well on current management. Doing great on Emgality, barely 4 headaches a month may need some tylenol for 2,  Roselyn Meier with side effects, emgality doing great.   Prevention: Continue Emgality and Topamax Actue/Emergency: Carrie Mew her sick, she has so few  migraines now she can use tylenol 2x a month as needed Consider botox in the future if needed Essential tremor: discussed (has Fhx of tremor), she does need medications at this time Triptans contraindicated due to stroke. Next can try nurtec if needed F/u 6 months   Meds ordered this encounter  Medications   topiramate (TOPAMAX) 50 MG tablet    Sig: Take 1 tablet (50 mg total) by mouth 2 (two) times daily.    Dispense:  180 tablet    Refill:  4   EMGALITY 120 MG/ML SOAJ    Sig: Inject 120 ml monthly.    Dispense:  1.12 mL    Refill:  11    Cc: Vivi Barrack, MD,  Ladell Pier, MD  Sarina Ill, MD  [facility] [address] [place], [place] [address]  Phone [phone] Fax [phone]  I spent 30 minutes of face-to-face and non-face-to-face time with patient on the  1. Chronic migraine without aura without status migrainosus, not intractable   2. Acute migraine    diagnosis.  This included previsit chart review, lab review, study review, order entry, electronic health record documentation, patient education on the different diagnostic and therapeutic options, counseling and coordination of care, risks and benefits of management, compliance, or risk factor reduction

## 2021-10-19 NOTE — Patient Instructions (Signed)
Continue current meds 

## 2021-10-19 NOTE — Progress Notes (Signed)
No showed this telephone appt

## 2021-10-20 ENCOUNTER — Telehealth: Payer: Self-pay | Admitting: Interventional Cardiology

## 2021-10-20 NOTE — Telephone Encounter (Signed)
LDL previously 111 on rosuvastatin 40mg  daily, goal < 55 given ASCVD + DM. She was started on Repatha 140mg  Q2W at 12/28 visit with Melissa. Statin also subsequently changed to atorvastatin 40mg  daily due to her CKD.  Spoke with pt, she reports feeling dizzy, scratchy eyes/throat, and palpitations that started as soon as she gave her first injection of Repatha, has had 2 injections so far. Discussed that none of these are side effects commonly seen with Repatha. States she has already made up her mind and decided to stop Repatha for her health because of the way she felt on it. She states she prefers to focus on diet and exercise because the "medication is killing" me. States she has been trying to call in to her PCP and cardiologist and no one would see her. Do not see any documented phone notes where she has called into [facility].  Discussed trying Nexlizet or Praluent to bring her LDL to goal. Pt does not want to add on additional medication at this time and states she will focus on diet and exercise. She is tolerating the atorvastatin and will continue taking that. Advised her to call clinic if she changes her mind and wishes to try either Nexlizet or Praluent.

## 2021-10-20 NOTE — Telephone Encounter (Signed)
°  Per MyChart scheduling message:  The recent prescription of Repatha is giving adverse reactions. Dr. Katrinka Blazing and I need to discuss alternatives to lower my cholesterol.

## 2021-10-30 ENCOUNTER — Other Ambulatory Visit: Payer: Self-pay | Admitting: Family Medicine

## 2021-11-13 ENCOUNTER — Other Ambulatory Visit: Payer: Self-pay | Admitting: Family Medicine

## 2021-11-15 NOTE — Telephone Encounter (Signed)
Last done by Historical Provider, MD

## 2021-11-22 ENCOUNTER — Other Ambulatory Visit: Payer: Self-pay

## 2021-11-22 ENCOUNTER — Telehealth: Payer: Self-pay | Admitting: Interventional Cardiology

## 2021-11-22 NOTE — Telephone Encounter (Signed)
Needs to come from PCP

## 2021-11-22 NOTE — Telephone Encounter (Signed)
That would be perfect.  Is anything I need to do?

## 2021-11-22 NOTE — Telephone Encounter (Signed)
°  Patient would like to switch from Dr Tamala Julian to Dr Oval Linsey.

## 2021-11-22 NOTE — Telephone Encounter (Signed)
Pt calling requesting a refill on dexilant. Would Dr. Katrinka Blazing like to refill this medication? Please address

## 2021-11-24 NOTE — Telephone Encounter (Signed)
Called pt and left message informing pt per Constance Holster, RN, Dr. Michaelle Copas nurse, that pt needed to refill medication with PCP and if pt has any other cardiac problems, questions or concerns, to give our office a call back.  ?

## 2021-11-30 ENCOUNTER — Other Ambulatory Visit: Payer: Self-pay | Admitting: Family Medicine

## 2021-12-15 ENCOUNTER — Other Ambulatory Visit: Payer: Medicare Other

## 2021-12-16 ENCOUNTER — Ambulatory Visit (INDEPENDENT_AMBULATORY_CARE_PROVIDER_SITE_OTHER): Payer: Medicare Other | Admitting: Sports Medicine

## 2021-12-16 ENCOUNTER — Other Ambulatory Visit: Payer: Self-pay

## 2021-12-16 DIAGNOSIS — G8929 Other chronic pain: Secondary | ICD-10-CM | POA: Diagnosis not present

## 2021-12-16 DIAGNOSIS — L603 Nail dystrophy: Secondary | ICD-10-CM | POA: Diagnosis not present

## 2021-12-16 DIAGNOSIS — Z9889 Other specified postprocedural states: Secondary | ICD-10-CM

## 2021-12-16 DIAGNOSIS — M79675 Pain in left toe(s): Secondary | ICD-10-CM

## 2021-12-16 NOTE — Progress Notes (Signed)
Subjective: ?Julie Gilbert is a 71 y.o. female patient seen today in office for pain at left great toe. Patient still gets reports she still gets some occasional sharp pain at the tip of the toe but otherwise doing okay.  Patient also states that she feels like a small piece of the nail may be trying to grow back.  Patient states that she also has issues with tripping and falling but she thinks it is related to her knees. ? ?Patient Active Problem List  ? Diagnosis Date Noted  ? Chronic migraine without aura without status migrainosus, not intractable 05/20/2021  ? 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  ? Allergic rhinitis 02/11/2021  ? Vertigo 02/27/2020  ? B12 deficiency 02/11/2020  ? Iron deficiency 02/11/2020  ? Inflamed seborrheic keratosis 01/30/2020  ? Asthma 06/27/2019  ? Chronic pain of both knees 02/15/2019  ? Coronary artery disease 10/01/2018  ? Dyslipidemia associated with type 2 diabetes mellitus ([place]) 05/18/2018  ? Anxiety 05/18/2018  ? CKD stage 3 due to type 2 diabetes mellitus ([place]) 05/18/2018  ? Chest pain syndrome 10/27/2017  ? Herniated intervertebral disc of lumbar spine 05/01/2017  ? Osteoarthritis of spine 02/03/2017  ? Moderate episode of recurrent major depressive disorder ([place]) 01/04/2017  ? Chronic anemia 06/29/2016  ? Gastroesophageal reflux disease without esophagitis 06/18/2015  ? Hypertension associated with diabetes ([place]) 05/13/2015  ? Type 2 diabetes mellitus with neurological complications ([place]) 74/08/8785  ? IBS (irritable bowel syndrome) 05/13/2015  ? Migraine headache 05/13/2015  ? Insomnia 05/13/2015  ? ? ?Current Outpatient Medications on File Prior to Visit  ?Medication Sig Dispense Refill  ? albuterol (VENTOLIN HFA) 108 (90 Base) MCG/ACT inhaler TAKE 2 PUFFS BY MOUTH EVERY 6 HOURS AS NEEDED FOR WHEEZE OR SHORTNESS OF BREATH 6.7 each 6  ? Ascorbic Acid (VITAMIN C) 1000 MG tablet Take 1,000 mg by mouth daily.    ? aspirin 81 MG tablet Take 81 mg by mouth daily.    ?  atorvastatin (LIPITOR) 40 MG tablet Take 1 tablet (40 mg total) by mouth daily. 90 tablet 3  ? Azelastine-Fluticasone 137-50 MCG/ACT SUSP PLACE 1 SPRAY INTO THE NOSE EVERY 12 (TWELVE) HOURS. 23 g 5  ? Blood Glucose Monitoring Suppl (ACCU-CHEK AVIVA PLUS) w/Device KIT Use as directed to check blood sugar once daily. E11.9 1 kit 0  ? Blood Pressure Monitoring (BLOOD PRESSURE KIT) DEVI 1 application by Does not apply route daily. Use to check blood pressure daily 1 Device 0  ? Cholecalciferol 1000 units tablet Take 1,000 Units by mouth daily.    ? clonazePAM (KLONOPIN) 1 MG tablet TAKE 1 TABLET BY MOUTH THREE TIMES A DAY 270 tablet 1  ? COD LIVER OIL PO Take 500 Units by mouth daily.    ? [facility]S OLOPATADINE HCL 0.2 % SOLN APPLY TO AFFECTED AREA EVERY DAY 2.5 mL 0  ? DEXILANT 60 MG capsule Take 1 capsule by mouth daily.    ? diclofenac (VOLTAREN) 75 MG EC tablet TAKE 1 TABLET BY MOUTH TWICE A DAY 60 tablet 0  ? EMGALITY 120 MG/ML SOAJ Inject 120 ml monthly. 1.12 mL 11  ? escitalopram (LEXAPRO) 10 MG tablet Take 1 tablet (10 mg total) by mouth daily. 90 tablet 3  ? Fish Oil-Cholecalciferol (FISH OIL + D3) 1000-1000 MG-UNIT CAPS Take 1 tablet by mouth daily.    ? glucose blood (ACCU-CHEK AVIVA PLUS) test strip Use as instructed to check blood sugar once daily. E11.9 100 each 12  ? Lancets (ACCU-CHEK  SOFT TOUCH) lancets Use as instructed to check blood sugar once daily E11.9 100 each 12  ? losartan (COZAAR) 25 MG tablet Take 1 tablet (25 mg total) by mouth daily. 90 tablet 3  ? meclizine (ANTIVERT) 25 MG tablet Take 1 tablet (25 mg total) by mouth 3 (three) times daily. 180 tablet 3  ? Multiple Vitamin (MULTIVITAMIN WITH MINERALS) TABS tablet Take 1 tablet by mouth daily.    ? nitroGLYCERIN (NITROSTAT) 0.4 MG SL tablet PLACE 1 TABLET (0.4 MG TOTAL) UNDER THE TONGUE EVERY 5 (FIVE) MINUTES AS NEEDED FOR CHEST PAIN. 25 tablet 10  ? OVER THE COUNTER MEDICATION 6.25 mg daily. Coleus    ? promethazine (PHENERGAN) 12.5 MG tablet  Take 1 tablet (12.5 mg total) by mouth every 8 (eight) hours as needed for nausea or vomiting. 20 tablet 0  ? SYMBICORT 160-4.5 MCG/ACT inhaler TAKE 2 PUFFS BY MOUTH TWICE A DAY 30.6 each 3  ? topiramate (TOPAMAX) 50 MG tablet Take 1 tablet (50 mg total) by mouth 2 (two) times daily. 180 tablet 4  ? vitamin B-12 (CYANOCOBALAMIN) 100 MCG tablet Take 100 mcg by mouth daily.    ? ?No current facility-administered medications on file prior to visit.  ? ? ?Allergies  ?Allergen Reactions  ? Peanuts [Peanut Oil] Anaphylaxis  ? Shrimp [Shellfish Allergy] Anaphylaxis  ? Latex Hives  ? Elemental Sulfur Itching and Swelling  ? Other   ?  Adhesive on cardiac monitor irritated skin  ? Penicillins Hives  ?  Has patient had a PCN reaction causing immediate rash, facial/tongue/throat swelling, SOB or lightheadedness with hypotension: No ?Has patient had a PCN reaction causing severe rash involving mucus membranes or skin necrosis: No ?Has patient had a PCN reaction that required hospitalization: No ?Has patient had a PCN reaction occurring within the last 10 years: No ? ?If all of the above answers are "NO", then may proceed with Cephalosporin use. ? ?  ? Pineapple   ?  Scratchy throat, Tongue swelling  ? Repatha [Evolocumab]   ?  Dizzy, scratchy eyes/throat, palpitations  ? Sulfa Antibiotics   ? ? ?Objective: ?There were no vitals filed for this visit. ? ?Objective:  ?General: Well developed, nourished, in no acute distress, alert and oriented x3  ? ?Dermatological: Skin is warm, dry and supple bilateral. Left hallux well-healed. Small spicule left hallux nail bed. There are no open sores, no preulcerative lesions, no rash or signs of infection present. ? ?Vascular: Dorsalis Pedis artery and Posterior Tibial artery pedal pulses are 2/4 bilateral with immedate capillary fill time. Pedal hair growth present. No varicosities and no lower extremity edema present bilateral.  ? ?Neruologic: Grossly intact via light touch bilateral.   Increased hypersensitivity to left great toe.  ? ?Musculoskeletal: Pain with palpation to the left great toe.  Strength appears to be within normal limits however there may be slight weakness on the left as compared to the right.  History of orthopedic knee problems. ? ? ? ?Assessment and Plan:  ?Problem List Items Addressed This Visit   ?None ?Visit Diagnoses   ? ? Chronic toe pain, left foot    -  Primary  ? Nail dystrophy      ? S/P nail surgery      ? ?  ?  ?  ?-Patient seen and evaluated ?-Discussed with patient long-term after left great toenail removal ?-Advised patient that there is a small spicule but at this time because it took her so long to  heal from a total avulsion I do not recommend a repeat nail avulsion; using a sterile nail nipper I mechanically debrided the mild spicule without incident and advised patient to try applying daily skin emollient to her dry skin as well as to the nailbed to prevent the spicule from growing so quickly or snagging; sample of foot miracle cream was provided ?-Advised patient to further talk to her orthopedic doctor about her knees which could be related to the reason why she is tripping and falling advised patient if this continues may benefit from physical therapy since there was mild decreased strength on the left ?-Patient to return in 9 weeks for toe check or sooner if issues arise ? ?Landis Martins, DPM ? ?

## 2021-12-21 ENCOUNTER — Other Ambulatory Visit: Payer: Self-pay | Admitting: Family Medicine

## 2021-12-22 NOTE — Telephone Encounter (Signed)
Filled by historical provider on 05/04/21 ?

## 2021-12-24 ENCOUNTER — Other Ambulatory Visit: Payer: Self-pay | Admitting: Family Medicine

## 2021-12-27 NOTE — Telephone Encounter (Signed)
No PCP 

## 2021-12-28 ENCOUNTER — Ambulatory Visit (INDEPENDENT_AMBULATORY_CARE_PROVIDER_SITE_OTHER): Payer: Medicare Other | Admitting: Family Medicine

## 2021-12-28 ENCOUNTER — Ambulatory Visit: Payer: Self-pay

## 2021-12-28 DIAGNOSIS — M170 Bilateral primary osteoarthritis of knee: Secondary | ICD-10-CM

## 2021-12-28 DIAGNOSIS — G8929 Other chronic pain: Secondary | ICD-10-CM | POA: Diagnosis not present

## 2021-12-28 DIAGNOSIS — M25562 Pain in left knee: Secondary | ICD-10-CM | POA: Diagnosis not present

## 2021-12-28 DIAGNOSIS — M25561 Pain in right knee: Secondary | ICD-10-CM | POA: Diagnosis not present

## 2021-12-28 NOTE — Progress Notes (Signed)
Julie Gilbert presents to clinic today for Gelsyn injection bilateral knees 1/3 ? ?Procedure: Real-time Ultrasound Guided Injection of right knee superior lateral patellar space ?Device: Philips Affiniti 50G ?Images permanently stored and available for review in PACS ?Verbal informed consent obtained.  Discussed risks and benefits of procedure. Warned about infection, bleeding, hyperglycemia damage to structures among others. ?Patient expresses understanding and agreement ?Time-out conducted.   ?Noted no overlying erythema, induration, or other signs of local infection.   ?Skin prepped in a sterile fashion.   ?Local anesthesia: Topical Ethyl chloride.   ?With sterile technique and under real time ultrasound guidance: Gelsyn 2 mL injected into  joint capsule fluid seen entering the knee joint.   ?Completed without difficulty  \   ?Advised to call if fevers/chills, erythema, induration, drainage, or persistent bleeding.   ?Images permanently stored and available for review in the ultrasound unit.  ?Impression: Technically successful ultrasound guided injection. ? ? ? ?Procedure: Real-time Ultrasound Guided Injection of left knee superior lateral patellar space ?Device: Philips Affiniti 50G ?Images permanently stored and available for review in PACS ?Verbal informed consent obtained.  Discussed risks and benefits of procedure. Warned about infection, bleeding, hyperglycemia damage to structures among others. ?Patient expresses understanding and agreement ?Time-out conducted.   ?Noted no overlying erythema, induration, or other signs of local infection.   ?Skin prepped in a sterile fashion.   ?Local anesthesia: Topical Ethyl chloride.   ?With sterile technique and under real time ultrasound guidance: Gelsyn 2 mL injected into knee joint. Fluid seen entering the joint capsule.   ?Completed without difficulty   ?Advised to call if fevers/chills, erythema, induration, drainage, or persistent bleeding.   ?Images permanently stored  and available for review in the ultrasound unit.  ?Impression: Technically successful ultrasound guided injection. ? ? ? ?Lot number: [id number] ? ?Return in 1 week for Gelsyn injection bilateral knees 2/3 ?

## 2021-12-28 NOTE — Patient Instructions (Addendum)
Good to see you today. ? ?You had B knee Gelsyn injections.  Call or go to the ER if you develop a large red swollen joint with extreme pain or oozing puss.  ? ?Please schedule 2 follow-up visits (1/week for the next 2 weeks) to complete the [place] series. ? ? ?

## 2022-01-04 ENCOUNTER — Ambulatory Visit (INDEPENDENT_AMBULATORY_CARE_PROVIDER_SITE_OTHER): Payer: Medicare Other | Admitting: Family Medicine

## 2022-01-04 ENCOUNTER — Ambulatory Visit: Payer: Self-pay

## 2022-01-04 DIAGNOSIS — M25562 Pain in left knee: Secondary | ICD-10-CM

## 2022-01-04 DIAGNOSIS — G8929 Other chronic pain: Secondary | ICD-10-CM

## 2022-01-04 DIAGNOSIS — M170 Bilateral primary osteoarthritis of knee: Secondary | ICD-10-CM | POA: Diagnosis not present

## 2022-01-04 DIAGNOSIS — M25561 Pain in right knee: Secondary | ICD-10-CM | POA: Diagnosis not present

## 2022-01-04 NOTE — Patient Instructions (Signed)
Thank you for coming in today.  ? ?You received Gelsyn injections in both of your knees today. Seek immediate medical attention if the joint becomes red, extremely painful, or is oozing fluid.  ? ?See you next week for the 3rd Gelsyn injections ?

## 2022-01-04 NOTE — Progress Notes (Signed)
Machell presents to clinic today for Gelsyn injection bilateral knees 2/3 ? ?Procedure: Real-time Ultrasound Guided Injection of right knee superior lateral patellar space ?Device: Philips Affiniti 50G ?Images permanently stored and available for review in PACS ?Verbal informed consent obtained.  Discussed risks and benefits of procedure. Warned about infection, bleeding, damage to structures among others. ?Patient expresses understanding and agreement ?Time-out conducted.   ?Noted no overlying erythema, induration, or other signs of local infection.   ?Skin prepped in a sterile fashion.   ?Local anesthesia: Topical Ethyl chloride.   ?With sterile technique and under real time ultrasound guidance: Gelsyn 2 mL injected into knee joint fluid seen entering the knee joint.   ?Completed without difficulty   ?Advised to call if fevers/chills, erythema, induration, drainage, or persistent bleeding.   ?Images permanently stored and available for review in the ultrasound unit.  ?Impression: Technically successful ultrasound guided injection. ? ? ? ?Procedure: Real-time Ultrasound Guided Injection of left knee superior lateral patellar space ?Device: Philips Affiniti 50G ?Images permanently stored and available for review in PACS ?Verbal informed consent obtained.  Discussed risks and benefits of procedure. Warned about infection, bleeding, damage to structures among others. ?Patient expresses understanding and agreement ?Time-out conducted.   ?Noted no overlying erythema, induration, or other signs of local infection.   ?Skin prepped in a sterile fashion.   ?Local anesthesia: Topical Ethyl chloride.   ?With sterile technique and under real time ultrasound guidance: Gelsyn 2 mL injected into knee joint. Fluid seen entering the joint capsule.   ?Completed without difficulty   ?Advised to call if fevers/chills, erythema, induration, drainage, or persistent bleeding.   ?Images permanently stored and available for review in the  ultrasound unit.  ?Impression: Technically successful ultrasound guided injection. ? ? ? ?Lot number: [id number] for both injections ?

## 2022-01-11 ENCOUNTER — Ambulatory Visit: Payer: Medicare Other | Admitting: Family Medicine

## 2022-01-18 ENCOUNTER — Ambulatory Visit: Payer: Self-pay

## 2022-01-18 ENCOUNTER — Ambulatory Visit (INDEPENDENT_AMBULATORY_CARE_PROVIDER_SITE_OTHER): Payer: Medicare Other | Admitting: Family Medicine

## 2022-01-18 DIAGNOSIS — M25562 Pain in left knee: Secondary | ICD-10-CM

## 2022-01-18 DIAGNOSIS — G8929 Other chronic pain: Secondary | ICD-10-CM

## 2022-01-18 DIAGNOSIS — M25561 Pain in right knee: Secondary | ICD-10-CM | POA: Diagnosis not present

## 2022-01-18 DIAGNOSIS — M170 Bilateral primary osteoarthritis of knee: Secondary | ICD-10-CM

## 2022-01-18 NOTE — Patient Instructions (Addendum)
Thank you for coming in today.  ° °You received the 3rd and final Gelsyn injection in both of your knees today. Seek immediate medical attention if the joint becomes red, extremely painful, or is oozing fluid.  ° °Recheck back as needed °

## 2022-01-18 NOTE — Progress Notes (Signed)
Julie Gilbert presents to clinic today for Gelsyn injection bilateral knees 3/3 ? ?Procedure: Real-time Ultrasound Guided Injection of right knee superior lateral patellar space ?Device: Philips Affiniti 50G ?Images permanently stored and available for review in PACS ?Verbal informed consent obtained.  Discussed risks and benefits of procedure. Warned about infection, bleeding, damage to structures among others. ?Patient expresses understanding and agreement ?Time-out conducted.   ?Noted no overlying erythema, induration, or other signs of local infection.   ?Skin prepped in a sterile fashion.   ?Local anesthesia: Topical Ethyl chloride.   ?With sterile technique and under real time ultrasound guidance: Gelsyn 2 mL injected into knee joint. Fluid seen entering the joint capsule.   ?Completed without difficulty   ?Advised to call if fevers/chills, erythema, induration, drainage, or persistent bleeding.   ?Images permanently stored and available for review in the ultrasound unit.  ?Impression: Technically successful ultrasound guided injection. ? ? ? ?Procedure: Real-time Ultrasound Guided Injection of left knee superior lateral patellar space ?Device: Philips Affiniti 50G ?Images permanently stored and available for review in PACS ?Verbal informed consent obtained.  Discussed risks and benefits of procedure. Warned about infection, bleeding, damage to structures among others. ?Patient expresses understanding and agreement ?Time-out conducted.   ?Noted no overlying erythema, induration, or other signs of local infection.   ?Skin prepped in a sterile fashion.   ?Local anesthesia: Topical Ethyl chloride.   ?With sterile technique and under real time ultrasound guidance: Gelsyn 2 mL injected into knee joint. Fluid seen entering the joint capsule.   ?Completed without difficulty   ?Advised to call if fevers/chills, erythema, induration, drainage, or persistent bleeding.   ?Images permanently stored and available for review in the  ultrasound unit.  ?Impression: Technically successful ultrasound guided injection. ? ? ? ?Lot number: [id number] bilaterally ? ?Return as needed. ? ? ?Patient notes her left knee is still painful.  If not doing well after completing [place] series would consider repeat knee x-ray as last x-rays from July 2022.  If she does not have a sufficient amount of arthritis to explain her pain would consider MRI. ?

## 2022-01-20 ENCOUNTER — Other Ambulatory Visit: Payer: Self-pay | Admitting: Family Medicine

## 2022-02-03 ENCOUNTER — Ambulatory Visit (INDEPENDENT_AMBULATORY_CARE_PROVIDER_SITE_OTHER): Payer: Medicare Other | Admitting: Cardiovascular Disease

## 2022-02-03 ENCOUNTER — Encounter (HOSPITAL_BASED_OUTPATIENT_CLINIC_OR_DEPARTMENT_OTHER): Payer: Self-pay | Admitting: Cardiovascular Disease

## 2022-02-03 ENCOUNTER — Telehealth (HOSPITAL_BASED_OUTPATIENT_CLINIC_OR_DEPARTMENT_OTHER): Payer: Self-pay | Admitting: *Deleted

## 2022-02-03 VITALS — BP 132/90 | HR 52 | Ht 67.0 in | Wt 181.0 lb

## 2022-02-03 DIAGNOSIS — R079 Chest pain, unspecified: Secondary | ICD-10-CM

## 2022-02-03 DIAGNOSIS — I152 Hypertension secondary to endocrine disorders: Secondary | ICD-10-CM | POA: Diagnosis not present

## 2022-02-03 DIAGNOSIS — E1159 Type 2 diabetes mellitus with other circulatory complications: Secondary | ICD-10-CM | POA: Diagnosis not present

## 2022-02-03 DIAGNOSIS — E785 Hyperlipidemia, unspecified: Secondary | ICD-10-CM | POA: Diagnosis not present

## 2022-02-03 DIAGNOSIS — Z01812 Encounter for preprocedural laboratory examination: Secondary | ICD-10-CM | POA: Diagnosis not present

## 2022-02-03 DIAGNOSIS — E1169 Type 2 diabetes mellitus with other specified complication: Secondary | ICD-10-CM

## 2022-02-03 DIAGNOSIS — I2510 Atherosclerotic heart disease of native coronary artery without angina pectoris: Secondary | ICD-10-CM

## 2022-02-03 MED ORDER — PREDNISONE 50 MG PO TABS: ORAL_TABLET | ORAL | 0 refills | Status: DC

## 2022-02-03 MED ORDER — NEXLIZET 180-10 MG PO TABS: 1.0000 | ORAL_TABLET | Freq: Every day | ORAL | 1 refills | Status: DC

## 2022-02-03 NOTE — Telephone Encounter (Signed)
NEXLIZET #[id number] EXP 01/2023 ?

## 2022-02-03 NOTE — Assessment & Plan Note (Signed)
Adding Nexlizet to atorvastatin as above. ?

## 2022-02-03 NOTE — Assessment & Plan Note (Addendum)
BP elevated both initially and on repeat.  She thinks that it is well-controlled.  She is going to track her blood pressures at home and bring her cuff to follow-up.  Continue losartan for now. ?

## 2022-02-03 NOTE — Progress Notes (Signed)
? ? ?Cardiology Office Note ? ? ?Date:  02/03/2022  ? ?ID:  Julie Gilbert, DOB Jun 20, 1951, MRN [id number] ? ?PCP:  Pcp, No  ?Cardiologist:   Skeet Latch, MD  ? ?No chief complaint on file. ? ? ?History of Present Illness: ?Julie Gilbert is a 71 y.o. female with non-obstructive CAD, hypertension, diabetes, TIA, hyperlipidemia, and CKD 3 who presents for follow-up.  She transferred to [facility] from [place] in the last couple years.  She was previously on amlodipine and a statin but both were discontinued due to intolerance.  She saw Dr. Tamala Julian 07/2021.  It was noted that her lipids were extremely elevated with LDLs over 200.  She had been previously on atorvastatin and this was discontinued.  She was tried on rosuvastatin and referred to the Pharm.D. for consideration of PCSK9 inhibitor.  She was tolerating rosuvastatin at the time.  She was also started on a PCSK9 inhibitor.  She tried Repatha but was unable to tolerate it due to flu-like symptoms.  Given her worsening renal function she was asked to stop taking diclofenac and was referred to nephrology.  She is struggling with stressful home environment. ? ?Julie Gilbert had a stress test 01/2018 that showed anteroseptal perfusion defect.  She underwent left heart cath and was found to have 25 to 30% distal left main disease and 50% mid RCA.  There was minimal disease in the LAD and left circumflex. ? ?She sometimes gets heart palpitations.  It is worse when she has to rush or anxious.   ?Her heart starts to race for a few seconds.  There is no associated SOB but she gets lightheaded.   ? ?She has struggled with vertigo intermittently since she was a child.  Today she is feeling off balance and having a flare.  Epply maneuvers make it worse.  She has severe allergies that have been hard to control. She hasn't gotten much exercise since she had surgery on her foot.  She is working on diet and is losing weight.  She is trying to eat more plant-based. ? ?Past  Medical History:  ?Diagnosis Date  ? Anxiety 05/18/2018  ? Asthma   ? Chronic anemia 06/29/2016  ? Chronic pain of both knees 02/15/2019  ? CKD stage 3 due to type 2 diabetes mellitus ([place]) 05/18/2018  ? Coronary artery disease 10/01/2018  ? Diabetes mellitus without complication ([place])   ? Dyslipidemia associated with type 2 diabetes mellitus ([place]) 05/18/2018  ? Lab Results Component Value Date  CHOL 198 08/17/2018  HDL 62.40 08/17/2018  LDLCALC 113 (H) 08/17/2018  TRIG 113.0 08/17/2018  CHOLHDL 3 08/17/2018    ? Gastroesophageal reflux disease without esophagitis 06/18/2015  ? Herniated intervertebral disc of lumbar spine 05/01/2017  ? Hypertension   ? Hypertension associated with diabetes ([place]) 05/13/2015  ? IBS (irritable bowel syndrome)   ? Insomnia 05/13/2015  ? Migraine headache 05/13/2015  ? Moderate episode of recurrent major depressive disorder ([place]) 01/04/2017  ? Osteoarthritis of spine 02/03/2017  ? Transient ischemic attack (TIA) 06/28/2016  ? Type 2 diabetes mellitus with neurological complications ([place]) 0/63/0160  ? Lab Results Component Value Date  HGBA1C 5.9 08/17/2018  HGBA1C 5.8 01/04/2017  HGBA1C 6.4 (H) 06/29/2016  Lab Results Component Value Date  MICROALBUR 10.3 (H) 08/17/2018  LDLCALC 113 (H) 08/17/2018  CREATININE 1.56 (H) 08/17/2018    ? ? ?Past Surgical History:  ?Procedure Laterality Date  ? CESAREAN SECTION    ? EXPLORATORY LAPAROTOMY    ? ? ? ?  Current Outpatient Medications  ?Medication Sig Dispense Refill  ? Bempedoic Acid-Ezetimibe (NEXLIZET) 180-10 MG TABS Take 1 tablet by mouth daily. 90 tablet 1  ? predniSONE (DELTASONE) 50 MG tablet Take 1 tablet 13 hours prior to test, then one 7 hours prior to test, and then one 1 hour prior to test 3 tablet 0  ? albuterol (VENTOLIN HFA) 108 (90 Base) MCG/ACT inhaler TAKE 2 PUFFS BY MOUTH EVERY 6 HOURS AS NEEDED FOR WHEEZE OR SHORTNESS OF BREATH 6.7 each 6  ? Ascorbic Acid (VITAMIN C) 1000 MG tablet Take 1,000 mg by mouth daily.    ? aspirin 81 MG tablet Take 81  mg by mouth daily.    ? atorvastatin (LIPITOR) 40 MG tablet Take 1 tablet (40 mg total) by mouth daily. 90 tablet 3  ? Azelastine-Fluticasone 137-50 MCG/ACT SUSP PLACE 1 SPRAY INTO THE NOSE EVERY 12 (TWELVE) HOURS. 23 g 5  ? Blood Glucose Monitoring Suppl (ACCU-CHEK AVIVA PLUS) w/Device KIT Use as directed to check blood sugar once daily. E11.9 1 kit 0  ? Blood Pressure Monitoring (BLOOD PRESSURE KIT) DEVI 1 application by Does not apply route daily. Use to check blood pressure daily 1 Device 0  ? Cholecalciferol 1000 units tablet Take 1,000 Units by mouth daily.    ? clonazePAM (KLONOPIN) 1 MG tablet TAKE 1 TABLET BY MOUTH THREE TIMES A DAY 270 tablet 1  ? COD LIVER OIL PO Take 500 Units by mouth daily.    ? CVS OLOPATADINE HCL 0.2 % SOLN APPLY TO AFFECTED AREA EVERY DAY 2.5 mL 0  ? DEXILANT 60 MG capsule Take 1 capsule by mouth daily.    ? diclofenac (VOLTAREN) 75 MG EC tablet TAKE 1 TABLET BY MOUTH TWICE A DAY 60 tablet 0  ? EMGALITY 120 MG/ML SOAJ Inject 120 ml monthly. 1.12 mL 11  ? escitalopram (LEXAPRO) 10 MG tablet Take 1 tablet (10 mg total) by mouth daily. 90 tablet 3  ? Fish Oil-Cholecalciferol (FISH OIL + D3) 1000-1000 MG-UNIT CAPS Take 1 tablet by mouth daily.    ? glucose blood (ACCU-CHEK AVIVA PLUS) test strip Use as instructed to check blood sugar once daily. E11.9 100 each 12  ? Lancets (ACCU-CHEK SOFT TOUCH) lancets Use as instructed to check blood sugar once daily E11.9 100 each 12  ? losartan (COZAAR) 25 MG tablet Take 1 tablet (25 mg total) by mouth daily. 90 tablet 3  ? meclizine (ANTIVERT) 25 MG tablet Take 1 tablet (25 mg total) by mouth 3 (three) times daily. 180 tablet 3  ? Multiple Vitamin (MULTIVITAMIN WITH MINERALS) TABS tablet Take 1 tablet by mouth daily.    ? nitroGLYCERIN (NITROSTAT) 0.4 MG SL tablet PLACE 1 TABLET UNDER THE TONGUE EVERY 5 MINUTES AS NEEDED FOR CHEST PAIN. 25 tablet 10  ? OVER THE COUNTER MEDICATION 6.25 mg daily. Coleus    ? promethazine (PHENERGAN) 12.5 MG tablet  Take 1 tablet (12.5 mg total) by mouth every 8 (eight) hours as needed for nausea or vomiting. 20 tablet 0  ? SYMBICORT 160-4.5 MCG/ACT inhaler TAKE 2 PUFFS BY MOUTH TWICE A DAY 30.6 each 3  ? topiramate (TOPAMAX) 50 MG tablet Take 1 tablet (50 mg total) by mouth 2 (two) times daily. 180 tablet 4  ? vitamin B-12 (CYANOCOBALAMIN) 100 MCG tablet Take 100 mcg by mouth daily.    ? ?No current facility-administered medications for this visit.  ? ? ?Allergies:   Peanuts [peanut oil], Shrimp [shellfish allergy], Latex, Elemental sulfur,  Other, Penicillins, Pineapple, Repatha [evolocumab], and Sulfa antibiotics  ? ? ?Social History:  The patient  reports that she quit smoking about 9 years ago. Her smoking use included cigarettes. She has never used smokeless tobacco. She reports current alcohol use of about 1.0 standard drink per week. She reports that she does not use drugs.  ? ?Family History:  The patient's family history includes Cancer in her maternal grandfather; Heart disease in her father; Hypertension in her mother; Kidney failure in her mother; Migraines in an other family member.  ? ? ?ROS:  Please see the history of present illness.   Otherwise, review of systems are positive for none.   All other systems are reviewed and negative.  ? ? ?PHYSICAL EXAM: ?VS:  BP 132/90 (BP Location: Left Arm, Patient Position: Sitting, Cuff Size: Large)   Pulse (!) 52   Ht $R'5\' 7"'ee$  (1.702 m)   Wt 181 lb (82.1 kg)   LMP  (LMP Unknown) Comment: Menopausal  BMI 28.35 kg/m?  , BMI Body mass index is 28.35 kg/m?. ?GENERAL:  Well appearing ?HEENT:  Pupils equal round and reactive, fundi not visualized, oral mucosa unremarkable ?NECK:  No jugular venous distention, waveform within normal limits, carotid upstroke brisk and symmetric, no bruits, no thyromegaly ?LUNGS:  Clear to auscultation bilaterally ?HEART:  RRR.  PMI not displaced or sustained,S1 and S2 within normal limits, no S3, no S4, no clicks, no rubs, no murmurs ?ABD:  Flat,  positive bowel sounds normal in frequency in pitch, no bruits, no rebound, no guarding, no midline pulsatile mass, no hepatomegaly, no splenomegaly ?EXT:  2 plus pulses throughout, no edema, no cyanosis no cl

## 2022-02-03 NOTE — Assessment & Plan Note (Addendum)
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  Symptoms now do not really seem to be ischemic.  However given her underlying coronary disease, we will get a coronary CTA to see if there has been progression of her disease.  She needs to be premedicated due to iodinated contrast allergy.  We discussed fact that her lipids need to better control.  Her LDL goal is less than 70.  She has struggled with statins and Repatha.  She is willing to try Nexlizet.  We will try that and repeat labs in 2 to 3 months.  Continue aspirin and atorvastatin. ?

## 2022-02-03 NOTE — Patient Instructions (Addendum)
Medication Instructions:  ?START NEXLIZET DAILY  ? ?START PREDNISONE  ?Prednisone 50 mg - take 13 hours prior to test ?Take another Prednisone 50 mg 7 hours prior to test ?Take another Prednisone 50 mg 1 hour prior to test ?Take Benadryl 50 mg 1 hour prior to test ? ? ? ?*If you need a refill on your cardiac medications before your next appointment, please call your pharmacy* ? ?Lab Work: ?BMET 1 WEEK PRIOR TO CT ? ?FASTING LP/CMET IN 2 MONTHS ABOUT A WEEK PRIOR TO FOLLOW UP  ? ?If you have labs (blood work) drawn today and your tests are completely normal, you will receive your results only by: ?MyChart Message (if you have MyChart) OR ?A paper copy in the mail ?If you have any lab test that is abnormal or we need to change your treatment, we will call you to review the results. ? ?Testing/Procedures: ?NONE ? ?Follow-Up: ?At [facility], you and your health needs are our priority.  As part of our continuing mission to provide you with exceptional heart care, we have created designated Provider Care Teams.  These Care Teams include your primary Cardiologist (physician) and Advanced Practice Providers (APPs -  Physician Assistants and Nurse Practitioners) who all work together to provide you with the care you need, when you need it. ? ?We recommend signing up for the patient portal called "MyChart".  Sign up information is provided on this After Visit Summary.  MyChart is used to connect with patients for Virtual Visits (Telemedicine).  Patients are able to view lab/test results, encounter notes, upcoming appointments, etc.  Non-urgent messages can be sent to your provider as well.   ?To learn more about what you can do with MyChart, go to [email or web address].   ? ?Your next appointment:   ?2 month(s) ? ?The format for your next appointment:   ?In Person ? ?Provider:   ?Chilton Si, MD or Gillian Shields, NP  ? ? ? ? ?Your cardiac CT will be scheduled at one of the below locations:  ? ?[facility] ?[address] ?[place], [place] [address] ?[phone] ? ?OR ? ?[facility] ?[address] ?[address] ?[place], [place] [address] ?[phone] ? ?If scheduled at [facility], please arrive at the [facility] (Entrance C2) of [facility] 30 minutes prior to test start time. ?You can use the FREE valet parking offered at entrance C (encouraged to control the heart rate for the test)  ?Proceed to the [facility] Radiology Department (first floor) to check-in and test prep. ? ?All radiology patients and guests should use entrance C2 at [facility], accessed from [facility], even though the hospital's physical address listed is [address] ? ? ? ?If scheduled at [facility], please arrive 15 mins early for check-in and test prep. ? ?Please follow these instructions carefully (unless otherwise directed): ? ?Hold all erectile dysfunction medications at least 3 days (72 hrs) prior to test. ? ?On the Night Before the Test: ?Be sure to Drink plenty of water. ?Do not consume any caffeinated/decaffeinated beverages or chocolate 12 hours prior to your test. ?Do not take any antihistamines 12 hours prior to your test. ?If the patient has contrast allergy: ?Patient will need a prescription for Prednisone and very clear instructions (as follows): ?Prednisone 50 mg - take 13 hours prior to test ?Take another Prednisone 50 mg 7 hours prior to test ?Take another Prednisone  50 mg 1 hour prior to test ?Take Benadryl 50 mg 1 hour prior to test ?Patient must complete all four doses of above prophylactic medications. ?Patient will need a ride after test due to Benadryl. ? ?On the Day of the Test: ?Drink plenty of water until 1 hour prior to the test. ?Do not eat any food 4 hours prior to the test. ?You may take your regular medications prior to the test.  ?Take metoprolol (Lopressor) two hours  prior to test. ?HOLD Furosemide/Hydrochlorothiazide morning of the test. ?FEMALES- please wear underwire-free bra if available, avoid dresses & tight clothing ? ?After the Test: ?Drink plenty of water. ?After receiving IV contrast, you may experience a mild flushed feeling. This is normal. ?On occasion, you may experience a mild rash up to 24 hours after the test. This is not dangerous. If this occurs, you can take Benadryl 25 mg and increase your fluid intake. ?If you experience trouble breathing, this can be serious. If it is severe call 911 IMMEDIATELY. If it is mild, please call our office. ?If you take any of these medications: Glipizide/Metformin, Avandament, Glucavance, please do not take 48 hours after completing test unless otherwise instructed. ? ?We will call to schedule your test 2-4 weeks out understanding that some insurance companies will need an authorization prior to the service being performed.  ? ?For non-scheduling related questions, please contact the cardiac imaging nurse navigator should you have any questions/concerns: ?Rockwell AlexandriaSara Wallace, Cardiac Imaging Nurse Navigator ?Larey BrickMerle Prescott, Cardiac Imaging Nurse Navigator ? [facility] V[facility] Services ?Direct Office Dial: [phone]  ? ?For scheduling needs, including cancellations and rescheduling, please call [place], [phone]. ? ?Cardiac CT Angiogram ?A cardiac CT angiogram is a procedure to look at the heart and the area around the heart. It may be done to help find the cause of chest pains or other symptoms of heart disease. During this procedure, a substance called contrast dye is injected into the blood vessels in the area to be checked. A large X-ray machine, called a CT scanner, then takes detailed pictures of the heart and the surrounding area. The procedure is also sometimes called a coronary CT angiogram, coronary artery scanning, or CTA. ?A cardiac CT angiogram allows the health care provider to see how well blood is  flowing to and from the heart. The health care provider will be able to see if there are any problems, such as: ?Blockage or narrowing of the coronary arteries in the heart. ?Fluid around the heart. ?Signs of weakness or disease in the muscles, valves, and tissues of the heart. ?Tell a health care provider about: ?Any allergies you have. This is especially important if you have had a previous allergic reaction to contrast dye. ?All medicines you are taking, including vitamins, herbs, eye drops, creams, and over-the-counter medicines. ?Any blood disorders you have. ?Any surgeries you have had. ?Any medical conditions you have. ?Whether you are pregnant or may be pregnant. ?Any anxiety disorders, chronic pain, or other conditions you have that may increase your stress or prevent you from lying still. ?What are the risks? ?Generally, this is a safe procedure. However, problems may occur, including: ?Bleeding. ?Infection. ?Allergic reactions to medicines or dyes. ?Damage to other structures or organs. ?Kidney damage from the contrast dye that is used. ?Increased risk of cancer from radiation exposure. This risk is low. Talk with your health care provider about: ?The risks and benefits of testing. ?How you can receive the lowest dose of radiation. ?What happens before  the procedure? ?Wear comfortable clothing and remove any jewelry, glasses, dentures, and hearing aids. ?Follow instructions from your health care provider about eating and drinking. This may include: ?For 12 hours before the procedure -- avoid caffeine. This includes tea, coffee, soda, energy drinks, and diet pills. Drink plenty of water or other fluids that do not have caffeine in them. Being well hydrated can prevent complications. ?For 4-6 hours before the procedure -- stop eating and drinking. The contrast dye can cause nausea, but this is less likely if your stomach is empty. ?Ask your health care provider about changing or stopping your regular  medicines. This is especially important if you are taking diabetes medicines, blood thinners, or medicines to treat problems with erections (erectile dysfunction). ?What happens during the procedure? ? ?Hair on your che

## 2022-02-16 ENCOUNTER — Other Ambulatory Visit: Payer: Self-pay | Admitting: Family Medicine

## 2022-02-16 ENCOUNTER — Other Ambulatory Visit: Payer: Self-pay

## 2022-02-16 ENCOUNTER — Emergency Department (HOSPITAL_BASED_OUTPATIENT_CLINIC_OR_DEPARTMENT_OTHER): Payer: Medicare Other

## 2022-02-16 ENCOUNTER — Encounter (HOSPITAL_BASED_OUTPATIENT_CLINIC_OR_DEPARTMENT_OTHER): Payer: Self-pay

## 2022-02-16 ENCOUNTER — Observation Stay (HOSPITAL_BASED_OUTPATIENT_CLINIC_OR_DEPARTMENT_OTHER)
Admission: EM | Admit: 2022-02-16 | Discharge: 2022-02-17 | Disposition: A | Discharge status: Home / Self Care | Payer: Medicare Other | Attending: Emergency Medicine | Admitting: Emergency Medicine

## 2022-02-16 DIAGNOSIS — N1831 Chronic kidney disease, stage 3a: Secondary | ICD-10-CM | POA: Diagnosis not present

## 2022-02-16 DIAGNOSIS — R778 Other specified abnormalities of plasma proteins: Secondary | ICD-10-CM | POA: Diagnosis not present

## 2022-02-16 DIAGNOSIS — E1149 Type 2 diabetes mellitus with other diabetic neurological complication: Secondary | ICD-10-CM

## 2022-02-16 DIAGNOSIS — R079 Chest pain, unspecified: Principal | ICD-10-CM | POA: Diagnosis present

## 2022-02-16 DIAGNOSIS — E1122 Type 2 diabetes mellitus with diabetic chronic kidney disease: Secondary | ICD-10-CM | POA: Diagnosis not present

## 2022-02-16 DIAGNOSIS — E1169 Type 2 diabetes mellitus with other specified complication: Secondary | ICD-10-CM | POA: Diagnosis present

## 2022-02-16 DIAGNOSIS — Z7982 Long term (current) use of aspirin: Secondary | ICD-10-CM | POA: Insufficient documentation

## 2022-02-16 DIAGNOSIS — N179 Acute kidney failure, unspecified: Secondary | ICD-10-CM | POA: Diagnosis not present

## 2022-02-16 DIAGNOSIS — J45909 Unspecified asthma, uncomplicated: Secondary | ICD-10-CM | POA: Diagnosis not present

## 2022-02-16 DIAGNOSIS — Z8673 Personal history of transient ischemic attack (TIA), and cerebral infarction without residual deficits: Secondary | ICD-10-CM | POA: Insufficient documentation

## 2022-02-16 DIAGNOSIS — R7989 Other specified abnormal findings of blood chemistry: Secondary | ICD-10-CM | POA: Diagnosis not present

## 2022-02-16 DIAGNOSIS — I160 Hypertensive urgency: Secondary | ICD-10-CM | POA: Diagnosis not present

## 2022-02-16 DIAGNOSIS — R002 Palpitations: Secondary | ICD-10-CM | POA: Insufficient documentation

## 2022-02-16 DIAGNOSIS — Z91040 Latex allergy status: Secondary | ICD-10-CM | POA: Insufficient documentation

## 2022-02-16 DIAGNOSIS — I2510 Atherosclerotic heart disease of native coronary artery without angina pectoris: Secondary | ICD-10-CM | POA: Diagnosis not present

## 2022-02-16 DIAGNOSIS — Z79899 Other long term (current) drug therapy: Secondary | ICD-10-CM | POA: Insufficient documentation

## 2022-02-16 DIAGNOSIS — E119 Type 2 diabetes mellitus without complications: Secondary | ICD-10-CM | POA: Diagnosis present

## 2022-02-16 DIAGNOSIS — R0602 Shortness of breath: Secondary | ICD-10-CM | POA: Insufficient documentation

## 2022-02-16 DIAGNOSIS — Z91010 Allergy to peanuts: Secondary | ICD-10-CM | POA: Diagnosis not present

## 2022-02-16 DIAGNOSIS — I129 Hypertensive chronic kidney disease with stage 1 through stage 4 chronic kidney disease, or unspecified chronic kidney disease: Secondary | ICD-10-CM | POA: Insufficient documentation

## 2022-02-16 DIAGNOSIS — I152 Hypertension secondary to endocrine disorders: Secondary | ICD-10-CM | POA: Diagnosis present

## 2022-02-16 DIAGNOSIS — Z87891 Personal history of nicotine dependence: Secondary | ICD-10-CM | POA: Diagnosis not present

## 2022-02-16 DIAGNOSIS — I10 Essential (primary) hypertension: Secondary | ICD-10-CM | POA: Diagnosis present

## 2022-02-16 DIAGNOSIS — R0989 Other specified symptoms and signs involving the circulatory and respiratory systems: Secondary | ICD-10-CM

## 2022-02-16 LAB — COMPREHENSIVE METABOLIC PANEL
ALT: 15 U/L (ref 0–44)
AST: 21 U/L (ref 15–41)
Albumin: 4.6 g/dL (ref 3.5–5.0)
Alkaline Phosphatase: 48 U/L (ref 38–126)
Anion gap: 12 (ref 5–15)
BUN: 21 mg/dL (ref 8–23)
CO2: 23 mmol/L (ref 22–32)
Calcium: 9.6 mg/dL (ref 8.9–10.3)
Chloride: 105 mmol/L (ref 98–111)
Creatinine, Ser: 2.39 mg/dL — ABNORMAL HIGH (ref 0.44–1.00)
GFR, Estimated: 22 mL/min — ABNORMAL LOW (ref >60)
Glucose, Bld: 73 mg/dL (ref 70–99)
Potassium: 4.4 mmol/L (ref 3.5–5.1)
Sodium: 140 mmol/L (ref 135–145)
Total Bilirubin: 0.7 mg/dL (ref 0.3–1.2)
Total Protein: 7.7 g/dL (ref 6.5–8.1)

## 2022-02-16 LAB — CBC WITH DIFFERENTIAL/PLATELET
Abs Immature Granulocytes: 0.01 10*3/uL (ref 0.00–0.07)
Basophils Absolute: 0 10*3/uL (ref 0.0–0.1)
Basophils Relative: 1 %
Eosinophils Absolute: 0 10*3/uL (ref 0.0–0.5)
Eosinophils Relative: 0 %
HCT: 34.6 % — ABNORMAL LOW (ref 36.0–46.0)
Hemoglobin: 11 g/dL — ABNORMAL LOW (ref 12.0–15.0)
Immature Granulocytes: 0 %
Lymphocytes Relative: 51 %
Lymphs Abs: 2.6 10*3/uL (ref 0.7–4.0)
MCH: 28.4 pg (ref 26.0–34.0)
MCHC: 31.8 g/dL (ref 30.0–36.0)
MCV: 89.4 fL (ref 80.0–100.0)
Monocytes Absolute: 0.5 10*3/uL (ref 0.1–1.0)
Monocytes Relative: 10 %
Neutro Abs: 1.9 10*3/uL (ref 1.7–7.7)
Neutrophils Relative %: 38 %
Platelets: 164 10*3/uL (ref 150–400)
RBC: 3.87 MIL/uL (ref 3.87–5.11)
RDW: 13.6 % (ref 11.5–15.5)
WBC: 5 10*3/uL (ref 4.0–10.5)
nRBC: 0 % (ref 0.0–0.2)

## 2022-02-16 LAB — TROPONIN I (HIGH SENSITIVITY): Troponin I (High Sensitivity): 23 ng/L — ABNORMAL HIGH (ref <18)

## 2022-02-16 LAB — URINALYSIS, ROUTINE W REFLEX MICROSCOPIC
Bilirubin Urine: NEGATIVE
Glucose, UA: NEGATIVE mg/dL
Hgb urine dipstick: NEGATIVE
Ketones, ur: NEGATIVE mg/dL
Leukocytes,Ua: NEGATIVE
Nitrite: NEGATIVE
Protein, ur: NEGATIVE mg/dL
Specific Gravity, Urine: 1.013 (ref 1.005–1.030)
pH: 5 (ref 5.0–8.0)

## 2022-02-16 LAB — D-DIMER, QUANTITATIVE: D-Dimer, Quant: 7.15 ug/mL-FEU — ABNORMAL HIGH (ref 0.00–0.50)

## 2022-02-16 NOTE — ED Triage Notes (Signed)
Pt presents to the ED with palpitations and [facility] that she states that she's had since April. States that she has been being seen by a cardiologist. Does report a temperature of 101 last night. EKG obtained at time of triage. Pt has a hx of hypertension and has been taking her BP medications as prescribed. BP 196/101 at time of triage.

## 2022-02-17 ENCOUNTER — Observation Stay (HOSPITAL_COMMUNITY): Payer: Medicare Other

## 2022-02-17 ENCOUNTER — Ambulatory Visit: Payer: Medicare Other | Admitting: Sports Medicine

## 2022-02-17 DIAGNOSIS — R0602 Shortness of breath: Secondary | ICD-10-CM | POA: Diagnosis not present

## 2022-02-17 DIAGNOSIS — N1831 Chronic kidney disease, stage 3a: Secondary | ICD-10-CM | POA: Diagnosis not present

## 2022-02-17 DIAGNOSIS — Z91040 Latex allergy status: Secondary | ICD-10-CM | POA: Diagnosis not present

## 2022-02-17 DIAGNOSIS — I2510 Atherosclerotic heart disease of native coronary artery without angina pectoris: Secondary | ICD-10-CM | POA: Diagnosis not present

## 2022-02-17 DIAGNOSIS — R7989 Other specified abnormal findings of blood chemistry: Secondary | ICD-10-CM | POA: Diagnosis not present

## 2022-02-17 DIAGNOSIS — N179 Acute kidney failure, unspecified: Secondary | ICD-10-CM | POA: Diagnosis not present

## 2022-02-17 DIAGNOSIS — R079 Chest pain, unspecified: Secondary | ICD-10-CM | POA: Diagnosis not present

## 2022-02-17 DIAGNOSIS — Z87891 Personal history of nicotine dependence: Secondary | ICD-10-CM | POA: Diagnosis not present

## 2022-02-17 DIAGNOSIS — Z8673 Personal history of transient ischemic attack (TIA), and cerebral infarction without residual deficits: Secondary | ICD-10-CM | POA: Diagnosis not present

## 2022-02-17 DIAGNOSIS — Z79899 Other long term (current) drug therapy: Secondary | ICD-10-CM | POA: Diagnosis not present

## 2022-02-17 DIAGNOSIS — J45909 Unspecified asthma, uncomplicated: Secondary | ICD-10-CM | POA: Diagnosis not present

## 2022-02-17 DIAGNOSIS — I129 Hypertensive chronic kidney disease with stage 1 through stage 4 chronic kidney disease, or unspecified chronic kidney disease: Secondary | ICD-10-CM | POA: Diagnosis not present

## 2022-02-17 DIAGNOSIS — Z7982 Long term (current) use of aspirin: Secondary | ICD-10-CM | POA: Diagnosis not present

## 2022-02-17 DIAGNOSIS — E1122 Type 2 diabetes mellitus with diabetic chronic kidney disease: Secondary | ICD-10-CM | POA: Diagnosis not present

## 2022-02-17 DIAGNOSIS — Z91010 Allergy to peanuts: Secondary | ICD-10-CM | POA: Diagnosis not present

## 2022-02-17 DIAGNOSIS — R778 Other specified abnormalities of plasma proteins: Secondary | ICD-10-CM | POA: Diagnosis not present

## 2022-02-17 DIAGNOSIS — R002 Palpitations: Secondary | ICD-10-CM | POA: Diagnosis not present

## 2022-02-17 LAB — BASIC METABOLIC PANEL
Anion gap: 13 (ref 5–15)
BUN: 18 mg/dL (ref 8–23)
CO2: 17 mmol/L — ABNORMAL LOW (ref 22–32)
Calcium: 9.7 mg/dL (ref 8.9–10.3)
Chloride: 110 mmol/L (ref 98–111)
Creatinine, Ser: 2.22 mg/dL — ABNORMAL HIGH (ref 0.44–1.00)
GFR, Estimated: 24 mL/min — ABNORMAL LOW (ref >60)
Glucose, Bld: 78 mg/dL (ref 70–99)
Potassium: 4.4 mmol/L (ref 3.5–5.1)
Sodium: 140 mmol/L (ref 135–145)

## 2022-02-17 LAB — HIV ANTIBODY (ROUTINE TESTING W REFLEX): HIV Screen 4th Generation wRfx: NONREACTIVE

## 2022-02-17 LAB — TROPONIN I (HIGH SENSITIVITY): Troponin I (High Sensitivity): 21 ng/L — ABNORMAL HIGH (ref <18)

## 2022-02-17 LAB — HEMOGLOBIN A1C
Hgb A1c MFr Bld: 6.1 % — ABNORMAL HIGH (ref 4.8–5.6)
Mean Plasma Glucose: 128.37 mg/dL

## 2022-02-17 LAB — HEPARIN LEVEL (UNFRACTIONATED): Heparin Unfractionated: >1.1 IU/mL — ABNORMAL HIGH (ref 0.30–0.70)

## 2022-02-17 MED ORDER — PANTOPRAZOLE SODIUM 40 MG PO TBEC
40.0000 mg | DELAYED_RELEASE_TABLET | Freq: Every day | ORAL | Status: DC
  Administered 2022-02-17: 40 mg via ORAL
  Filled 2022-02-17: qty 1

## 2022-02-17 MED ORDER — ALBUTEROL SULFATE (2.5 MG/3ML) 0.083% IN NEBU: 2.5000 mg | INHALATION_SOLUTION | Freq: Four times a day (QID) | RESPIRATORY_TRACT | Status: DC | PRN

## 2022-02-17 MED ORDER — ALBUTEROL SULFATE HFA 108 (90 BASE) MCG/ACT IN AERS: 2.0000 | INHALATION_SPRAY | Freq: Four times a day (QID) | RESPIRATORY_TRACT | Status: DC | PRN

## 2022-02-17 MED ORDER — ATORVASTATIN CALCIUM 40 MG PO TABS
40.0000 mg | ORAL_TABLET | Freq: Every evening | ORAL | Status: DC
Start: 2022-02-17 — End: 2022-02-17

## 2022-02-17 MED ORDER — MOMETASONE FURO-FORMOTEROL FUM 200-5 MCG/ACT IN AERO
2.0000 | INHALATION_SPRAY | Freq: Two times a day (BID) | RESPIRATORY_TRACT | Status: DC
  Filled 2022-02-17: qty 8.8

## 2022-02-17 MED ORDER — ACETAMINOPHEN 650 MG RE SUPP: 650.0000 mg | Freq: Four times a day (QID) | RECTAL | Status: DC | PRN

## 2022-02-17 MED ORDER — TECHNETIUM TO 99M ALBUMIN AGGREGATED
4.0000 | Freq: Once | INTRAVENOUS | Status: AC | PRN
  Administered 2022-02-17: 4 via INTRAVENOUS

## 2022-02-17 MED ORDER — CLONAZEPAM 1 MG PO TABS: 1.0000 mg | ORAL_TABLET | Freq: Three times a day (TID) | ORAL | 0 refills | Status: DC | PRN

## 2022-02-17 MED ORDER — HEPARIN BOLUS VIA INFUSION
4000.0000 [IU] | Freq: Once | INTRAVENOUS | Status: AC
  Administered 2022-02-17: 4000 [IU] via INTRAVENOUS

## 2022-02-17 MED ORDER — ACETAMINOPHEN 325 MG PO TABS: 650.0000 mg | ORAL_TABLET | Freq: Four times a day (QID) | ORAL | Status: DC | PRN

## 2022-02-17 MED ORDER — MECLIZINE HCL 25 MG PO TABS
25.0000 mg | ORAL_TABLET | Freq: Three times a day (TID) | ORAL | Status: DC
  Filled 2022-02-17 (×2): qty 1

## 2022-02-17 MED ORDER — CLONAZEPAM 1 MG PO TABS
1.0000 mg | ORAL_TABLET | Freq: Three times a day (TID) | ORAL | Status: DC
Start: 2022-02-17 — End: 2022-02-17
  Administered 2022-02-17: 1 mg via ORAL
  Filled 2022-02-17: qty 1

## 2022-02-17 MED ORDER — HEPARIN (PORCINE) 25000 UT/250ML-% IV SOLN
1300.0000 [IU]/h | INTRAVENOUS | Status: DC
  Administered 2022-02-17: 1300 [IU]/h via INTRAVENOUS
  Filled 2022-02-17: qty 250

## 2022-02-17 MED ORDER — LABETALOL HCL 5 MG/ML IV SOLN
20.0000 mg | Freq: Once | INTRAVENOUS | Status: DC
  Filled 2022-02-17: qty 4

## 2022-02-17 MED ORDER — TOPIRAMATE 25 MG PO TABS
50.0000 mg | ORAL_TABLET | Freq: Two times a day (BID) | ORAL | Status: DC
  Administered 2022-02-17: 50 mg via ORAL
  Filled 2022-02-17: qty 2

## 2022-02-17 MED ORDER — HEPARIN (PORCINE) 25000 UT/250ML-% IV SOLN: 1050.0000 [IU]/h | INTRAVENOUS | Status: DC

## 2022-02-17 MED ORDER — ESCITALOPRAM OXALATE 10 MG PO TABS
10.0000 mg | ORAL_TABLET | Freq: Every day | ORAL | Status: DC
  Administered 2022-02-17: 10 mg via ORAL
  Filled 2022-02-17: qty 1

## 2022-02-17 MED ORDER — HYDRALAZINE HCL 20 MG/ML IJ SOLN: 5.0000 mg | INTRAMUSCULAR | Status: DC | PRN

## 2022-02-17 MED ORDER — BEMPEDOIC ACID-EZETIMIBE 180-10 MG PO TABS: 1.0000 | ORAL_TABLET | Freq: Every morning | ORAL | Status: DC

## 2022-02-17 MED ORDER — AMLODIPINE BESYLATE 5 MG PO TABS: 5.0000 mg | ORAL_TABLET | Freq: Every day | ORAL | Status: DC

## 2022-02-17 MED ORDER — SODIUM CHLORIDE 0.9 % IV SOLN: INTRAVENOUS | Status: DC

## 2022-02-17 NOTE — Plan of Care (Signed)

## 2022-02-17 NOTE — ED Notes (Signed)
MD made aware of BP 180/126 // pending new orders

## 2022-02-17 NOTE — Progress Notes (Signed)
Patient admitted after midnight, please see H&P.  Here with SOB.  Ruling out PE currently-- await V/Q scan.   Shortness of breath, chest pain, palpitations Symptoms ongoing for several weeks.  ACS less likely as troponin mildly elevated but stable.  Chest x-ray showing no active disease.  PE is on the differential given significantly elevated D-dimer. -Continue IV heparin -CTA chest cannot be done to rule out PE given AKI/low GFR.  VQ scan ordered and pending   AKI on CKD stage IIIa Creatinine 2.3, baseline 1.8. -strict I/O -Monitor renal function -Avoid nephrotoxic agents/contrast.  Hold losartan.   Asthma Stable, no wheezing. -Pharmacy med rec pending.   Diet controlled type 2 diabetes A1c 6.0 a year ago. -Repeat A1c pending   Hypertension -Hold losartan given AKI -IV hydralazine prn   Hyperlipidemia -Pharmacy med rec pending.   Migraines -resume home med of topamax  Eulogio Bear DO

## 2022-02-17 NOTE — Progress Notes (Signed)
[place] for heparin Indication:  suspected PE  Allergies  Allergen Reactions   Peanuts [Peanut Oil] Anaphylaxis   Shrimp [Shellfish Allergy] Anaphylaxis   Latex Hives   Elemental Sulfur Itching and Swelling   Other     Adhesive on cardiac monitor irritated skin   Penicillins Hives    Has patient had a PCN reaction causing immediate rash, facial/tongue/throat swelling, SOB or lightheadedness with hypotension: No Has patient had a PCN reaction causing severe rash involving mucus membranes or skin necrosis: No Has patient had a PCN reaction that required hospitalization: No Has patient had a PCN reaction occurring within the last 10 years: No  If all of the above answers are "NO", then may proceed with Cephalosporin use.     Pineapple     Scratchy throat, Tongue swelling   Repatha [Evolocumab]     Dizzy, scratchy eyes/throat, palpitations   Sulfa Antibiotics     Patient Measurements: Height: 5\' 7"  (170.2 cm) Weight: 79.2 kg (174 lb 9.6 oz) IBW/kg (Calculated) : 61.6 Heparin Dosing Weight: 80kg  Vital Signs: Temp: 98.6 F (37 C) (05/25 0750) Temp Source: Oral (05/25 0750) BP: 174/96 (05/25 0750) Pulse Rate: 57 (05/25 0750)  Labs: Recent Labs    02/16/22 2108 02/16/22 2355 02/17/22 1004  HGB 11.0*  --   --   HCT 34.6*  --   --   PLT 164  --   --   HEPARINUNFRC  --   --  >1.10*  CREATININE 2.39*  --   --   TROPONINIHS 23* 21*  --      Estimated Creatinine Clearance: 24.4 mL/min (A) (by C-G formula based on SCr of 2.39 mg/dL (H)).   Assessment: 71yo female c/o SOB/palpitations since April but that worsened over the day Wednesday, no tachyarrhythmias in ED with a few PVCs, noted to be in sinus brady, D-dimer found to be elevated, awaiting VQ scan to r/o PE >> to begin heparin.  Heparin level >1.1 on infusion at 1300 units/hr. Confirmed with RN that level was NOT drawn from arm where heparin is infusing. No bleeding  noted.  Goal of Therapy:  Heparin level 0.3-0.7 units/ml Monitor platelets by anticoagulation protocol: Yes   Plan:  Hold heparin x 1 hour Restart heparin at 1050 units/hr F/u 8 hr heparin level post restart F/u VQ scan  Sherlon Handing, PharmD, BCPS Please see amion for complete clinical pharmacist phone list 02/17/2022,10:42 AM

## 2022-02-17 NOTE — Progress Notes (Signed)
Pt to be d/c from 4E to home with daughter. D/c instructions and med education provided. IV and tele removed.   Raelyn Number, RN

## 2022-02-17 NOTE — ED Notes (Signed)
Report given to [facility] 

## 2022-02-17 NOTE — H&P (Signed)
History and Physical    Julie Gilbert ZLD:[id number] DOB: 11-24-1950 DOA: 02/16/2022  PCP: Pcp, No  Patient coming from: [place] ED  Chief Complaint: Shortness of breath  HPI: Julie Gilbert is a 71 y.o. female with medical history significant of asthma, chronic anemia, CKD, CAD, type II diabetes, hyperlipidemia, GERD, hypertension, CAD presented to the ED with complaints of chest pain, shortness of breath, and palpitations.  Blood pressure elevated on arrival to the ED at 196/101.  Heart rate in the 50s to 60s.  Not hypoxic.  Labs showing WBC 5.0, hemoglobin 11.0 (no significant change from baseline), platelet count 164k.  Sodium 140, potassium 4.4, chloride 105, bicarb 23, BUN 21, creatinine 2.3 (baseline 1.5-1.8), glucose 73.  D-dimer 7.15.  High-sensitivity troponin 23 > 21.  EKG without acute ischemic changes.  Chest x-ray showing no active disease. Patient was started on IV heparin.  History provided by patient and her daughter at bedside.  She is having palpitations, shortness of breath, and chest pain for several weeks.  Symptoms have been getting progressively worse.  She was seen by her cardiologist recently and was started on a new medication for hyperlipidemia.  Patient reports history of substernal chest pain waking her up from sleep at night.  Symptoms became worse yesterday which daughter thinks might be due to anxiety as she was in the hospital.  She takes losartan for hypertension and has not missed any doses.  Review of Systems:  Review of Systems  All other systems reviewed and are negative.  Past Medical History:  Diagnosis Date   Anxiety 05/18/2018   Asthma    Chronic anemia 06/29/2016   Chronic pain of both knees 02/15/2019   CKD stage 3 due to type 2 diabetes mellitus ([place]) 05/18/2018   Coronary artery disease 10/01/2018   Diabetes mellitus without complication ([place])    Dyslipidemia associated with type 2 diabetes mellitus ([place]) 05/18/2018   Lab Results Component Value Date   CHOL 198 08/17/2018  HDL 62.40 08/17/2018  LDLCALC 113 (H) 08/17/2018  TRIG 113.0 08/17/2018  CHOLHDL 3 08/17/2018     Gastroesophageal reflux disease without esophagitis 06/18/2015   Herniated intervertebral disc of lumbar spine 05/01/2017   Hypertension    Hypertension associated with diabetes ([place]) 05/13/2015   IBS (irritable bowel syndrome)    Insomnia 05/13/2015   Migraine headache 05/13/2015   Moderate episode of recurrent major depressive disorder ([place]) 01/04/2017   Osteoarthritis of spine 02/03/2017   Transient ischemic attack (TIA) 06/28/2016   Type 2 diabetes mellitus with neurological complications ([place]) 05/30/91   Lab Results Component Value Date  HGBA1C 5.9 08/17/2018  HGBA1C 5.8 01/04/2017  HGBA1C 6.4 (H) 06/29/2016  Lab Results Component Value Date  MICROALBUR 10.3 (H) 08/17/2018  LDLCALC 113 (H) 08/17/2018  CREATININE 1.56 (H) 08/17/2018      Past Surgical History:  Procedure Laterality Date   CESAREAN SECTION     EXPLORATORY LAPAROTOMY       reports that she quit smoking about 9 years ago. Her smoking use included cigarettes. She has never used smokeless tobacco. She reports current alcohol use of about 1.0 standard drink per week. She reports that she does not use drugs.  Allergies  Allergen Reactions   Peanuts [Peanut Oil] Anaphylaxis   Shrimp [Shellfish Allergy] Anaphylaxis   Latex Hives   Elemental Sulfur Itching and Swelling   Other     Adhesive on cardiac monitor irritated skin   Penicillins Hives    Has patient had  a PCN reaction causing immediate rash, facial/tongue/throat swelling, SOB or lightheadedness with hypotension: No Has patient had a PCN reaction causing severe rash involving mucus membranes or skin necrosis: No Has patient had a PCN reaction that required hospitalization: No Has patient had a PCN reaction occurring within the last 10 years: No  If all of the above answers are "NO", then may proceed with Cephalosporin use.     Pineapple      Scratchy throat, Tongue swelling   Repatha [Evolocumab]     Dizzy, scratchy eyes/throat, palpitations   Sulfa Antibiotics     Family History  Problem Relation Age of Onset   Kidney failure Mother    Hypertension Mother    Heart disease Father    Cancer Maternal Grandfather    Migraines Other    Breast cancer Neg Hx     Prior to Admission medications   Medication Sig Start Date End Date Taking? Authorizing Provider  albuterol (VENTOLIN HFA) 108 (90 Base) MCG/ACT inhaler TAKE 2 PUFFS BY MOUTH EVERY 6 HOURS AS NEEDED FOR WHEEZE OR SHORTNESS OF BREATH 07/06/21   Vivi Barrack, MD  Ascorbic Acid (VITAMIN C) 1000 MG tablet Take 1,000 mg by mouth daily.    [provider]  aspirin 81 MG tablet Take 81 mg by mouth daily.    [provider]  atorvastatin (LIPITOR) 40 MG tablet Take 1 tablet (40 mg total) by mouth daily. 10/06/21   Belva Crome, MD  Azelastine-Fluticasone 137-50 MCG/ACT SUSP PLACE 1 SPRAY INTO THE NOSE EVERY 12 (TWELVE) HOURS. 09/02/21   Vivi Barrack, MD  Bempedoic Acid-Ezetimibe (NEXLIZET) 180-10 MG TABS Take 1 tablet by mouth daily. 02/03/22   Skeet Latch, MD  Blood Glucose Monitoring Suppl (ACCU-CHEK AVIVA PLUS) w/Device KIT Use as directed to check blood sugar once daily. E11.9 06/20/17   Tresa Garter, MD  Blood Pressure Monitoring (BLOOD PRESSURE KIT) DEVI 1 application by Does not apply route daily. Use to check blood pressure daily 06/20/17   Tresa Garter, MD  Cholecalciferol 1000 units tablet Take 1,000 Units by mouth daily.    [provider]  clonazePAM (KLONOPIN) 1 MG tablet TAKE 1 TABLET BY MOUTH THREE TIMES A DAY 07/27/21   Vivi Barrack, MD  COD LIVER OIL PO Take 500 Units by mouth daily.    [provider]  [facility] OLOPATADINE HCL 0.2 % SOLN APPLY TO AFFECTED AREA EVERY DAY 03/05/21   Vivi Barrack, MD  DEXILANT 60 MG capsule Take 1 capsule by mouth daily. 05/04/21   [provider]  diclofenac  (VOLTAREN) 75 MG EC tablet TAKE 1 TABLET BY MOUTH TWICE A DAY 11/01/21   Vivi Barrack, MD  EMGALITY 120 MG/ML SOAJ Inject 120 ml monthly. 10/19/21   Melvenia Beam, MD  escitalopram (LEXAPRO) 10 MG tablet Take 1 tablet (10 mg total) by mouth daily. 03/22/21   Marin Olp, MD  Fish Oil-Cholecalciferol (FISH OIL + D3) 1000-1000 MG-UNIT CAPS Take 1 tablet by mouth daily.    [provider]  glucose blood (ACCU-CHEK AVIVA PLUS) test strip Use as instructed to check blood sugar once daily. E11.9 06/20/17   Tresa Garter, MD  Lancets (ACCU-CHEK SOFT TOUCH) lancets Use as instructed to check blood sugar once daily E11.9 06/20/17   Tresa Garter, MD  losartan (COZAAR) 25 MG tablet Take 1 tablet (25 mg total) by mouth daily. 03/22/21   Marin Olp, MD  meclizine Johnathan Hausen)  25 MG tablet Take 1 tablet (25 mg total) by mouth 3 (three) times daily. 03/22/21   Marin Olp, MD  Multiple Vitamin (MULTIVITAMIN WITH MINERALS) TABS tablet Take 1 tablet by mouth daily.    [provider]  nitroGLYCERIN (NITROSTAT) 0.4 MG SL tablet PLACE 1 TABLET UNDER THE TONGUE EVERY 5 MINUTES AS NEEDED FOR CHEST PAIN. 01/21/22   Vivi Barrack, MD  OVER THE COUNTER MEDICATION 6.25 mg daily. [place]    [provider]  predniSONE (DELTASONE) 50 MG tablet Take 1 tablet 13 hours prior to test, then one 7 hours prior to test, and then one 1 hour prior to test 02/03/22   Skeet Latch, MD  promethazine (PHENERGAN) 12.5 MG tablet Take 1 tablet (12.5 mg total) by mouth every 8 (eight) hours as needed for nausea or vomiting. 02/11/21   Vivi Barrack, MD  SYMBICORT 160-4.5 MCG/ACT inhaler TAKE 2 PUFFS BY MOUTH TWICE A DAY 08/04/21   Vivi Barrack, MD  topiramate (TOPAMAX) 50 MG tablet Take 1 tablet (50 mg total) by mouth 2 (two) times daily. 10/19/21   Melvenia Beam, MD  vitamin B-12 (CYANOCOBALAMIN) 100 MCG tablet Take 100 mcg by mouth daily.    [provider]     Physical Exam: Vitals:   02/17/22 0100 02/17/22 0147 02/17/22 0200 02/17/22 0320  BP: (!) 180/97 (!) 180/126 (!) 183/90 (!) 164/50  Pulse: (!) 51 62 (!) 54 (!) 58  Resp: 17 (!) $Remo'22 18 15  'mfnHI$ Temp:    98.7 F (37.1 C)  TempSrc:    Oral  SpO2: 100% 95% 99% 100%  Weight:    79.2 kg  Height:    '5\' 7"'$  (1.702 m)    Physical Exam Vitals reviewed.  Constitutional:      General: She is not in acute distress. HENT:     Head: Normocephalic and atraumatic.  Eyes:     Extraocular Movements: Extraocular movements intact.  Cardiovascular:     Rate and Rhythm: Normal rate and regular rhythm.     Pulses: Normal pulses.  Pulmonary:     Effort: Pulmonary effort is normal. No respiratory distress.     Breath sounds: Normal breath sounds. No wheezing or rales.  Abdominal:     General: Bowel sounds are normal. There is no distension.     Palpations: Abdomen is soft.     Tenderness: There is no abdominal tenderness.  Musculoskeletal:        General: No swelling or tenderness.     Cervical back: Normal range of motion.  Skin:    General: Skin is warm and dry.  Neurological:     General: No focal deficit present.     Mental Status: She is alert and oriented to person, place, and time.     Labs on Admission: I have personally reviewed following labs and imaging studies  CBC: Recent Labs  Lab 02/16/22 2108  WBC 5.0  NEUTROABS 1.9  HGB 11.0*  HCT 34.6*  MCV 89.4  PLT 356   Basic Metabolic Panel: Recent Labs  Lab 02/16/22 2108  NA 140  K 4.4  CL 105  CO2 23  GLUCOSE 73  BUN 21  CREATININE 2.39*  CALCIUM 9.6   GFR: Estimated Creatinine Clearance: 24.4 mL/min (A) (by C-G formula based on SCr of 2.39 mg/dL (H)). Liver Function Tests: Recent Labs  Lab 02/16/22 2108  AST 21  ALT 15  ALKPHOS 48  BILITOT 0.7  PROT 7.7  ALBUMIN 4.6   No results for input(s): LIPASE, AMYLASE in the last 168 hours. No results for input(s): AMMONIA in the last 168 hours. Coagulation  Profile: No results for input(s): INR, PROTIME in the last 168 hours. Cardiac Enzymes: No results for input(s): CKTOTAL, CKMB, CKMBINDEX, TROPONINI in the last 168 hours. BNP (last 3 results) No results for input(s): PROBNP in the last 8760 hours. HbA1C: No results for input(s): HGBA1C in the last 72 hours. CBG: No results for input(s): GLUCAP in the last 168 hours. Lipid Profile: No results for input(s): CHOL, HDL, LDLCALC, TRIG, CHOLHDL, LDLDIRECT in the last 72 hours. Thyroid Function Tests: No results for input(s): TSH, T4TOTAL, FREET4, T3FREE, THYROIDAB in the last 72 hours. Anemia Panel: No results for input(s): VITAMINB12, FOLATE, FERRITIN, TIBC, IRON, RETICCTPCT in the last 72 hours. Urine analysis:    Component Value Date/Time   COLORURINE YELLOW 02/16/2022 2254   APPEARANCEUR CLEAR 02/16/2022 2254   LABSPEC 1.013 02/16/2022 2254   PHURINE 5.0 02/16/2022 2254   GLUCOSEU NEGATIVE 02/16/2022 2254   GLUCOSEU NEGATIVE 08/29/2019 0902   HGBUR NEGATIVE 02/16/2022 2254   BILIRUBINUR NEGATIVE 02/16/2022 2254   BILIRUBINUR neg 01/04/2017 1056   [place] 02/16/2022 2254   PROTEINUR NEGATIVE 02/16/2022 2254   UROBILINOGEN 0.2 08/29/2019 0902   NITRITE NEGATIVE 02/16/2022 2254   LEUKOCYTESUR NEGATIVE 02/16/2022 2254    Radiological Exams on Admission: I have personally reviewed images DG Chest Port 1 View  Result Date: 02/16/2022 CLINICAL DATA:  Shortness of breath EXAM: PORTABLE CHEST 1 VIEW COMPARISON:  09/27/2017 FINDINGS: The heart size and mediastinal contours are within normal limits. Both lungs are clear. The visualized skeletal structures are unremarkable. IMPRESSION: No active disease. Electronically Signed   By: Donavan Foil M.D.   On: 02/16/2022 21:30    EKG: Independently reviewed.  Sinus bradycardia, T wave inversions in inferolateral leads.  No significant change since prior tracing.  Assessment and Plan  Shortness of breath, chest pain,  palpitations Symptoms ongoing for several weeks.  ACS less likely as troponin mildly elevated but stable.  Chest x-ray showing no active disease.  PE is on the differential given significantly elevated D-dimer. -Cardiac monitoring -Continue IV heparin -CTA chest cannot be done to rule out PE given AKI/low GFR.  VQ scan ordered.  AKI Creatinine 2.3, baseline 1.5-1.8. -IV fluid hydration -Monitor renal function -Avoid nephrotoxic agents/contrast.  Hold losartan.  Asthma Stable, no wheezing. -Pharmacy med rec pending.  Diet controlled type 2 diabetes A1c 6.0 a year ago. -Repeat A1c  Hypertension Blood pressure now improved with most recent systolic in the 503U. -Hold losartan given AKI -IV hydralazine prn  Hyperlipidemia -Pharmacy med rec pending.  DVT prophylaxis: IV heparin gtt Code Status: Full Code (discussed with the patient and her daughter) Family Communication: Daughter at bedside. Level of care: Telemetry bed Admission status: It is my clinical opinion that referral for OBSERVATION is reasonable and necessary in this patient based on the above information provided. The aforementioned taken together are felt to place the patient at high risk for further clinical deterioration. However, it is anticipated that the patient may be medically stable for discharge from the hospital within 24 to 48 hours.   Shela Leff MD [facility]  If 7PM-7AM, please contact night-coverage [email or web address]  02/17/2022, 6:00 AM

## 2022-02-17 NOTE — ED Notes (Addendum)
MD requested for BP to be addressed by admitting MD at [facility] // [facility] made aware

## 2022-02-17 NOTE — ED Notes (Signed)
MD made aware of pt HR 54 prior to given labetalol // med canceled by MD

## 2022-02-17 NOTE — Discharge Summary (Incomplete)
Physician Discharge Summary  VENUS GILLES UJW:[id number] DOB: 02-09-1951 DOA: 02/16/2022  PCP: Pcp, No  Admit date: 02/16/2022 Discharge date: 02/17/2022  Admitted From:  Discharge disposition: ***   Recommendations for Outpatient Follow-Up:   *** (include homehealth, outpatient follow-up instructions, specific recommendations for PCP to follow-up on, etc.) LACE score = ***, corresponding to a *** % of re-admission within 30 days.   (http://tools.farmacologiaclinica.info/index.php?sid=[email or web address])   Discharge Diagnosis:   Principal Problem:   Chest pain Active Problems:   Hypertension associated with diabetes ([place])   Dyslipidemia associated with type 2 diabetes mellitus ([place])   Asthma   AKI (acute kidney injury) ([place])    Discharge Condition: Improved.  Diet recommendation: Low sodium, heart healthy.  Carbohydrate-modified.  Regular.  Wound care: None.  Code status: Full.   History of Present Illness:   ***   Hospital Course by Problem:   ***    Medical Consultants:      Discharge Exam:   Vitals:   02/17/22 0320 02/17/22 0750  BP: (!) 164/50 (!) 174/96  Pulse: (!) 58 (!) 57  Resp: 15 17  Temp: 98.7 F (37.1 C) 98.6 F (37 C)  SpO2: 100% 100%   Vitals:   02/17/22 0147 02/17/22 0200 02/17/22 0320 02/17/22 0750  BP: (!) 180/126 (!) 183/90 (!) 164/50 (!) 174/96  Pulse: 62 (!) 54 (!) 58 (!) 57  Resp: (!) _0 Temp:   98.7 F (37.1 C) 98.6 F (37 C)  TempSrc:   Oral Oral  SpO2: 95% 99% 100% 100%  Weight:   79.2 kg   Height:   5' 7" (1.702 m)     General exam: Appears calm and comfortable. *** Respiratory system: Clear to auscultation. Respiratory effort normal. Cardiovascular system: S1 & S2 heard, RRR. No JVD,  rubs, gallops or clicks. No murmurs. Gastrointestinal system: Abdomen is nondistended, soft and nontender. No organomegaly or masses felt. Normal bowel sounds heard. Central nervous system: Alert and oriented. No focal  neurological deficits. Extremities: No clubbing,  or cyanosis. No edema. Skin: No rashes, lesions or ulcers. Psychiatry: Judgement and insight appear normal. Mood & affect appropriate.    The results of significant diagnostics from this hospitalization (including imaging, microbiology, ancillary and laboratory) are listed below for reference.     Procedures and Diagnostic Studies:   NM Pulmonary Perfusion  Result Date: 02/17/2022 CLINICAL DATA:  Chest pain and shortness of breath question pulmonary embolism EXAM: NUCLEAR MEDICINE PERFUSION LUNG SCAN TECHNIQUE: Perfusion images were obtained in multiple projections after intravenous injection of radiopharmaceutical. Ventilation scans intentionally deferred if perfusion scan and chest x-ray adequate for interpretation during COVID 19 epidemic. RADIOPHARMACEUTICALS:  4 mCi Tc-95mMAA IV COMPARISON:  Chest radiograph 02/16/2022 FINDINGS: Normal perfusion lung scan. No perfusion defects. IMPRESSION: Normal perfusion lung scan. Electronically Signed   By: MLavonia DanaM.D.   On: 02/17/2022 13:08   DG Chest Port 1 View  Result Date: 02/16/2022 CLINICAL DATA:  Shortness of breath EXAM: PORTABLE CHEST 1 VIEW COMPARISON:  09/27/2017 FINDINGS: The heart size and mediastinal contours are within normal limits. Both lungs are clear. The visualized skeletal structures are unremarkable. IMPRESSION: No active disease. Electronically Signed   By: KDonavan FoilM.D.   On: 02/16/2022 21:30     Labs:   Basic Metabolic Panel: Recent Labs  Lab 02/16/22 2108 02/17/22 1004  NA 140 140  K 4.4 4.4  CL 105 110  CO2 23 17*  GLUCOSE 73  78  BUN 21 18  CREATININE 2.39* 2.22*  CALCIUM 9.6 9.7   GFR Estimated Creatinine Clearance: 26.3 mL/min (A) (by C-G formula based on SCr of 2.22 mg/dL (H)). Liver Function Tests: Recent Labs  Lab 02/16/22 2108  AST 21  ALT 15  ALKPHOS 48  BILITOT 0.7  PROT 7.7  ALBUMIN 4.6   No results for input(s): LIPASE, AMYLASE  in the last 168 hours. No results for input(s): AMMONIA in the last 168 hours. Coagulation profile No results for input(s): INR, PROTIME in the last 168 hours.  CBC: Recent Labs  Lab 02/16/22 2108  WBC 5.0  NEUTROABS 1.9  HGB 11.0*  HCT 34.6*  MCV 89.4  PLT 164   Cardiac Enzymes: No results for input(s): CKTOTAL, CKMB, CKMBINDEX, TROPONINI in the last 168 hours. BNP: Invalid input(s): POCBNP CBG: No results for input(s): GLUCAP in the last 168 hours. D-Dimer Recent Labs    02/16/22 2108  DDIMER 7.15*   Hgb A1c Recent Labs    02/17/22 1004  HGBA1C 6.1*   Lipid Profile No results for input(s): CHOL, HDL, LDLCALC, TRIG, CHOLHDL, LDLDIRECT in the last 72 hours. Thyroid function studies No results for input(s): TSH, T4TOTAL, T3FREE, THYROIDAB in the last 72 hours.  Invalid input(s): FREET3 Anemia work up No results for input(s): VITAMINB12, FOLATE, FERRITIN, TIBC, IRON, RETICCTPCT in the last 72 hours. Microbiology No results found for this or any previous visit (from the past 240 hour(s)).   Discharge Instructions:   Discharge Instructions     Diet - low sodium heart healthy   Complete by: As directed    Diet Carb Modified   Complete by: As directed    Discharge instructions   Complete by: As directed    Stay hydrated Your HgA1c is 6.1 (last in 22 was 5.1)-- be cautious about what you are eating/drinking   Increase activity slowly   Complete by: As directed       Allergies as of 02/17/2022       Reactions   Peanuts [peanut Oil] Anaphylaxis   Shrimp [shellfish Allergy] Anaphylaxis   Latex Hives   Elemental Sulfur Itching, Swelling   Other Other (See Comments)   Adhesive on EKG pads irritated skin   Penicillins Hives   Has patient had a PCN reaction causing immediate rash, facial/tongue/throat swelling, SOB or lightheadedness with hypotension: No Has patient had a PCN reaction causing severe rash involving mucus membranes or skin necrosis: No Has  patient had a PCN reaction that required hospitalization: No Has patient had a PCN reaction occurring within the last 10 years: No If all of the above answers are "NO", then may proceed with Cephalosporin use.   Pineapple Swelling, Other (See Comments)   Scratchy throat, Tongue swelling   Repatha [evolocumab] Other (See Comments)   Dizzy, scratchy eyes/throat, palpitations   Sulfa Antibiotics Hives        Medication List     STOP taking these medications    amLODipine 5 MG tablet Commonly known as: NORVASC   Dexilant 60 MG capsule Generic drug: dexlansoprazole   naproxen sodium 220 MG tablet Commonly known as: ALEVE   promethazine 12.5 MG tablet Commonly known as: PHENERGAN       TAKE these medications    Accu-Chek Aviva Plus w/Device Kit Use as directed to check blood sugar once daily. E11.9   accu-chek soft touch lancets Use as instructed to check blood sugar once daily E11.9   albuterol 108 (90 Base) MCG/ACT inhaler  Commonly known as: VENTOLIN HFA TAKE 2 PUFFS BY MOUTH EVERY 6 HOURS AS NEEDED FOR WHEEZE OR SHORTNESS OF BREATH What changed:  how much to take when to take this reasons to take this additional instructions   aspirin EC 81 MG tablet Take 81 mg by mouth every morning. Swallow whole.   atorvastatin 40 MG tablet Commonly known as: LIPITOR Take 1 tablet (40 mg total) by mouth daily. What changed: when to take this   Blood Pressure Kit Devi 1 application by Does not apply route daily. Use to check blood pressure daily   cholecalciferol 25 MCG (1000 UNIT) tablet Commonly known as: VITAMIN D3 Take 1,000 Units by mouth at bedtime.   clonazePAM 1 MG tablet Commonly known as: KLONOPIN Take 1 tablet (1 mg total) by mouth 3 (three) times daily as needed for anxiety. What changed:  when to take this reasons to take this   COD LIVER OIL PO Take 1-2 capsules by mouth See admin instructions. Take one capsule by mouth every morning and take two  capsules at night   CRANBERRY/PROBIOTIC PO Take 2 capsules by mouth every morning.   Emgality 120 MG/ML [place] Generic drug: Galcanezumab-gnlm Inject 120 ml monthly. What changed:  how much to take how to take this when to take this additional instructions   escitalopram 10 MG tablet Commonly known as: LEXAPRO Take 1 tablet (10 mg total) by mouth daily. What changed: when to take this   FISH OIL PO Take 1 capsule by mouth at bedtime.   fluticasone 50 MCG/ACT nasal spray Commonly known as: FLONASE Place 1 spray into both nostrils daily as needed for allergies or rhinitis.   GARLIC PO Take 1 tablet by mouth at bedtime.   glucose blood test strip Commonly known as: Accu-Chek Aviva Plus Use as instructed to check blood sugar once daily. E11.9   losartan 25 MG tablet Commonly known as: COZAAR Take 1 tablet (25 mg total) by mouth daily. What changed: when to take this   meclizine 25 MG tablet Commonly known as: ANTIVERT Take 1 tablet (25 mg total) by mouth 3 (three) times daily.   multivitamin with minerals Tabs tablet Take 1 tablet by mouth at bedtime.   Nexlizet 180-10 MG Tabs Generic drug: Bempedoic Acid-Ezetimibe Take 1 tablet by mouth daily. What changed: when to take this   nitroGLYCERIN 0.4 MG SL tablet Commonly known as: NITROSTAT PLACE 1 TABLET UNDER THE TONGUE EVERY 5 MINUTES AS NEEDED FOR CHEST PAIN. What changed: See the new instructions.   OVER THE COUNTER MEDICATION Take 1 capsule by mouth at bedtime. Mother's nature for menopause   pantoprazole 40 MG tablet Commonly known as: PROTONIX Take 40 mg by mouth every morning.   predniSONE 50 MG tablet Commonly known as: DELTASONE Take 1 tablet 13 hours prior to test, then one 7 hours prior to test, and then one 1 hour prior to test   Symbicort 160-4.5 MCG/ACT inhaler Generic drug: budesonide-formoterol TAKE 2 PUFFS BY MOUTH TWICE A DAY What changed: See the new instructions.   Systane 0.4-0.3 %  Soln Generic drug: Polyethyl Glycol-Propyl Glycol Place 1 drop into both eyes daily.   topiramate 50 MG tablet Commonly known as: TOPAMAX Take 1 tablet (50 mg total) by mouth 2 (two) times daily. What changed:  how much to take when to take this additional instructions   vitamin C 1000 MG tablet Take 1,000 mg by mouth at bedtime.  Time coordinating discharge: ***  Signed:  Geradine Girt DO  [facility] 02/17/2022, 2:01 PM

## 2022-02-17 NOTE — Progress Notes (Signed)
ANTICOAGULATION CONSULT NOTE - Initial Consult  Pharmacy Consult for heparin Indication:  suspected PE  Allergies  Allergen Reactions   Peanuts [Peanut Oil] Anaphylaxis   Shrimp [Shellfish Allergy] Anaphylaxis   Latex Hives   Elemental Sulfur Itching and Swelling   Other     Adhesive on cardiac monitor irritated skin   Penicillins Hives    Has patient had a PCN reaction causing immediate rash, facial/tongue/throat swelling, SOB or lightheadedness with hypotension: No Has patient had a PCN reaction causing severe rash involving mucus membranes or skin necrosis: No Has patient had a PCN reaction that required hospitalization: No Has patient had a PCN reaction occurring within the last 10 years: No  If all of the above answers are "NO", then may proceed with Cephalosporin use.     Pineapple     Scratchy throat, Tongue swelling   Repatha [Evolocumab]     Dizzy, scratchy eyes/throat, palpitations   Sulfa Antibiotics     Patient Measurements: Height: 5\' 7"  (170.2 cm) Weight: 81.6 kg (180 lb) IBW/kg (Calculated) : 61.6 Heparin Dosing Weight: 80kg  Vital Signs: Temp: 99.5 F (37.5 C) (05/24 2059) BP: 180/97 (05/25 0100) Pulse Rate: 51 (05/25 0100)  Labs: Recent Labs    02/16/22 2108 02/16/22 2355  HGB 11.0*  --   HCT 34.6*  --   PLT 164  --   CREATININE 2.39*  --   TROPONINIHS 23* 21*    Estimated Creatinine Clearance: 24.8 mL/min (A) (by C-G formula based on SCr of 2.39 mg/dL (H)).   Medical History: Past Medical History:  Diagnosis Date   Anxiety 05/18/2018   Asthma    Chronic anemia 06/29/2016   Chronic pain of both knees 02/15/2019   CKD stage 3 due to type 2 diabetes mellitus ([place]) 05/18/2018   Coronary artery disease 10/01/2018   Diabetes mellitus without complication ([place])    Dyslipidemia associated with type 2 diabetes mellitus ([place]) 05/18/2018   Lab Results Component Value Date  CHOL 198 08/17/2018  HDL 62.40 08/17/2018  LDLCALC 113 (H) 08/17/2018   TRIG 113.0 08/17/2018  CHOLHDL 3 08/17/2018     Gastroesophageal reflux disease without esophagitis 06/18/2015   Herniated intervertebral disc of lumbar spine 05/01/2017   Hypertension    Hypertension associated with diabetes ([place]) 05/13/2015   IBS (irritable bowel syndrome)    Insomnia 05/13/2015   Migraine headache 05/13/2015   Moderate episode of recurrent major depressive disorder ([place]) 01/04/2017   Osteoarthritis of spine 02/03/2017   Transient ischemic attack (TIA) 06/28/2016   Type 2 diabetes mellitus with neurological complications ([place]) [id number]   Lab Results Component Value Date  HGBA1C 5.9 08/17/2018  HGBA1C 5.8 01/04/2017  HGBA1C 6.4 (H) 06/29/2016  Lab Results Component Value Date  MICROALBUR 10.3 (H) 08/17/2018  LDLCALC 113 (H) 08/17/2018  CREATININE 1.56 (H) 08/17/2018      Assessment: 71yo female c/o SOB/palpitations since April but that worsened over the day Wednesday, no tachyarrhythmias in ED with a few PVCs, noted to be in sinus brady, D-dimer found to be elevated, awaiting VQ scan to r/o PE >> to begin heparin.  Goal of Therapy:  Heparin level 0.3-0.7 units/ml Monitor platelets by anticoagulation protocol: Yes   Plan:  Heparin 4000 units IV bolus x1 followed by infusion at 1300 units/hr and monitor heparin levels and CBC.  Wynona Neat, PharmD, BCPS  02/17/2022,1:11 AM

## 2022-02-17 NOTE — ED Notes (Signed)
Called [facility] to transport patient to [facility] 4E room 20 °

## 2022-02-17 NOTE — ED Provider Notes (Signed)
[facility] Provider Note: Georgena Spurling, MD, FACEP  CSN: [id number] MRN: [id number] ARRIVAL: 02/16/22 at 2045 ROOM: [place]  Palpitations   HISTORY OF PRESENT ILLNESS  02/17/22 12:26 AM Julie Gilbert is a 71 y.o. female who has been having episodes of palpitations with shortness of breath and chest discomfort for at least the past month.  The episodes are episodic and nothing brings them on or makes them better or worse.  She had a worse episode yesterday.  She states that when her heart beats really fast it then suddenly slows and she can feel it beating abnormally slow.  She has not had any episodes of palpitations since she has been on the monitor in the ED.  She has seen a cardiologist for this but due to a latex allergy was not able to tolerate a cardiac monitor.  She has known chronic kidney disease.   Past Medical History:  Diagnosis Date   Anxiety 05/18/2018   Asthma    Chronic anemia 06/29/2016   Chronic pain of both knees 02/15/2019   CKD stage 3 due to type 2 diabetes mellitus ([place]) 05/18/2018   Coronary artery disease 10/01/2018   Diabetes mellitus without complication ([place])    Dyslipidemia associated with type 2 diabetes mellitus ([place]) 05/18/2018   Lab Results Component Value Date  CHOL 198 08/17/2018  HDL 62.40 08/17/2018  LDLCALC 113 (H) 08/17/2018  TRIG 113.0 08/17/2018  CHOLHDL 3 08/17/2018     Gastroesophageal reflux disease without esophagitis 06/18/2015   Herniated intervertebral disc of lumbar spine 05/01/2017   Hypertension    Hypertension associated with diabetes ([place]) 05/13/2015   IBS (irritable bowel syndrome)    Insomnia 05/13/2015   Migraine headache 05/13/2015   Moderate episode of recurrent major depressive disorder ([place]) 01/04/2017   Osteoarthritis of spine 02/03/2017   Transient ischemic attack (TIA) 06/28/2016   Type 2 diabetes mellitus with neurological complications ([place]) 03/20/6388   Lab Results Component Value Date   HGBA1C 5.9 08/17/2018  HGBA1C 5.8 01/04/2017  HGBA1C 6.4 (H) 06/29/2016  Lab Results Component Value Date  MICROALBUR 10.3 (H) 08/17/2018  LDLCALC 113 (H) 08/17/2018  CREATININE 1.56 (H) 08/17/2018      Past Surgical History:  Procedure Laterality Date   CESAREAN SECTION     EXPLORATORY LAPAROTOMY      Family History  Problem Relation Age of Onset   Kidney failure Mother    Hypertension Mother    Heart disease Father    Cancer Maternal Grandfather    Migraines Other    Breast cancer Neg Hx     Social History   Tobacco Use   Smoking status: Former    Types: Cigarettes    Quit date: 09/22/2012    Years since quitting: 9.4   Smokeless tobacco: Never  Vaping Use   Vaping Use: Never used  Substance Use Topics   Alcohol use: Yes    Alcohol/week: 1.0 standard drink    Types: 1 Glasses of wine per week    Comment: red wine 1-2 x month   Drug use: No    Prior to Admission medications   Medication Sig Start Date End Date Taking? Authorizing Provider  albuterol (VENTOLIN HFA) 108 (90 Base) MCG/ACT inhaler TAKE 2 PUFFS BY MOUTH EVERY 6 HOURS AS NEEDED FOR WHEEZE OR SHORTNESS OF BREATH 07/06/21   Vivi Barrack, MD  Ascorbic Acid (VITAMIN C) 1000 MG tablet Take 1,000 mg by mouth daily.  [provider]  aspirin 81 MG tablet Take 81 mg by mouth daily.    [provider]  atorvastatin (LIPITOR) 40 MG tablet Take 1 tablet (40 mg total) by mouth daily. 10/06/21   Lyn Records, MD  Azelastine-Fluticasone 137-50 MCG/ACT SUSP PLACE 1 SPRAY INTO THE NOSE EVERY 12 (TWELVE) HOURS. 09/02/21   Ardith Dark, MD  Bempedoic Acid-Ezetimibe (NEXLIZET) 180-10 MG TABS Take 1 tablet by mouth daily. 02/03/22   Chilton Si, MD  Blood Glucose Monitoring Suppl (ACCU-CHEK AVIVA PLUS) w/Device KIT Use as directed to check blood sugar once daily. E11.9 06/20/17   Quentin Angst, MD  Blood Pressure Monitoring (BLOOD PRESSURE KIT) DEVI 1 application by Does not apply route  daily. Use to check blood pressure daily 06/20/17   Quentin Angst, MD  Cholecalciferol 1000 units tablet Take 1,000 Units by mouth daily.    [provider]  clonazePAM (KLONOPIN) 1 MG tablet TAKE 1 TABLET BY MOUTH THREE TIMES A DAY 07/27/21   Ardith Dark, MD  COD LIVER OIL PO Take 500 Units by mouth daily.    [provider]  [facility] OLOPATADINE HCL 0.2 % SOLN APPLY TO AFFECTED AREA EVERY DAY 03/05/21   Ardith Dark, MD  DEXILANT 60 MG capsule Take 1 capsule by mouth daily. 05/04/21   [provider]  diclofenac (VOLTAREN) 75 MG EC tablet TAKE 1 TABLET BY MOUTH TWICE A DAY 11/01/21   Ardith Dark, MD  EMGALITY 120 MG/ML SOAJ Inject 120 ml monthly. 10/19/21   Anson Fret, MD  escitalopram (LEXAPRO) 10 MG tablet Take 1 tablet (10 mg total) by mouth daily. 03/22/21   Shelva Majestic, MD  Fish Oil-Cholecalciferol (FISH OIL + D3) 1000-1000 MG-UNIT CAPS Take 1 tablet by mouth daily.    [provider]  glucose blood (ACCU-CHEK AVIVA PLUS) test strip Use as instructed to check blood sugar once daily. E11.9 06/20/17   Quentin Angst, MD  Lancets (ACCU-CHEK SOFT TOUCH) lancets Use as instructed to check blood sugar once daily E11.9 06/20/17   Quentin Angst, MD  losartan (COZAAR) 25 MG tablet Take 1 tablet (25 mg total) by mouth daily. 03/22/21   Shelva Majestic, MD  meclizine (ANTIVERT) 25 MG tablet Take 1 tablet (25 mg total) by mouth 3 (three) times daily. 03/22/21   Shelva Majestic, MD  Multiple Vitamin (MULTIVITAMIN WITH MINERALS) TABS tablet Take 1 tablet by mouth daily.    [provider]  nitroGLYCERIN (NITROSTAT) 0.4 MG SL tablet PLACE 1 TABLET UNDER THE TONGUE EVERY 5 MINUTES AS NEEDED FOR CHEST PAIN. 01/21/22   Ardith Dark, MD  OVER THE COUNTER MEDICATION 6.25 mg daily. [place]    [provider]  predniSONE (DELTASONE) 50 MG tablet Take 1 tablet 13 hours prior to test, then one 7 hours prior to test, and then one 1  hour prior to test 02/03/22   Chilton Si, MD  promethazine (PHENERGAN) 12.5 MG tablet Take 1 tablet (12.5 mg total) by mouth every 8 (eight) hours as needed for nausea or vomiting. 02/11/21   Ardith Dark, MD  SYMBICORT 160-4.5 MCG/ACT inhaler TAKE 2 PUFFS BY MOUTH TWICE A DAY 08/04/21   Ardith Dark, MD  topiramate (TOPAMAX) 50 MG tablet Take 1 tablet (50 mg total) by mouth 2 (two) times daily. 10/19/21   Anson Fret, MD  vitamin B-12 (CYANOCOBALAMIN) 100 MCG tablet Take 100 mcg by mouth daily.  [provider]    Allergies Peanuts [peanut oil], Shrimp [shellfish allergy], Latex, Elemental sulfur, Other, Penicillins, Pineapple, Repatha [evolocumab], and Sulfa antibiotics   REVIEW OF SYSTEMS  Negative except as noted here or in the History of Present Illness.   PHYSICAL EXAMINATION  Initial Vital Signs Blood pressure (!) 159/102, pulse (!) 53, temperature 99.5 F (37.5 C), resp. rate (!) 24, height $RemoveBe'5\' 7"'LfnmgVVgU$  (1.702 m), weight 81.6 kg, SpO2 99 %.  Examination General: Well-developed, well-nourished female in no acute distress; appearance consistent with age of record HENT: normocephalic; atraumatic Eyes: pupils equal, round and reactive to light; extraocular muscles intact; arcus senilis bilaterally Neck: supple Heart: regular rate and rhythm; bradycardia; rare PVC Lungs: clear to auscultation bilaterally Abdomen: soft; nondistended; nontender; bowel sounds present Extremities: No deformity; full range of motion; pulses normal Neurologic: Awake, alert and oriented; motor function intact in all extremities and symmetric; no facial droop Skin: Warm and dry Psychiatric: Normal mood and affect   RESULTS  Summary of this visit's results, reviewed and interpreted by myself:   EKG Interpretation  Date/Time:  Wednesday Feb 16 2022 20:57:46 EDT Ventricular Rate:  53 PR Interval:  142 QRS Duration: 82 QT Interval:  442 QTC Calculation: 414 R Axis:   -36 Text  Interpretation: Sinus bradycardia Left axis deviation Minimal voltage criteria for LVH, may be normal variant ( R in aVL ) T wave abnormality, consider inferior ischemia T wave abnormality, consider anterolateral ischemia Abnormal ECG When compared with ECG of 27-Sep-2017 13:02, PREVIOUS ECG IS PRESENT when compared to prior, sharper inverted t waves than prior. No STEMI Confirmed by Tegeler, Gerald Stabs [phone]) on 02/16/2022 9:22:18 PM       Laboratory Studies: Results for orders placed or performed during the hospital encounter of 02/16/22 (from the past 24 hour(s))  Comprehensive metabolic panel     Status: Abnormal   Collection Time: 02/16/22  9:08 PM  Result Value Ref Range   Sodium 140 135 - 145 mmol/L   Potassium 4.4 3.5 - 5.1 mmol/L   Chloride 105 98 - 111 mmol/L   CO2 23 22 - 32 mmol/L   Glucose, Bld 73 70 - 99 mg/dL   BUN 21 8 - 23 mg/dL   Creatinine, Ser 2.39 (H) 0.44 - 1.00 mg/dL   Calcium 9.6 8.9 - 10.3 mg/dL   Total Protein 7.7 6.5 - 8.1 g/dL   Albumin 4.6 3.5 - 5.0 g/dL   AST 21 15 - 41 U/L   ALT 15 0 - 44 U/L   Alkaline Phosphatase 48 38 - 126 U/L   Total Bilirubin 0.7 0.3 - 1.2 mg/dL   GFR, Estimated 22 (L) >60 mL/min   Anion gap 12 5 - 15  D-dimer, quantitative     Status: Abnormal   Collection Time: 02/16/22  9:08 PM  Result Value Ref Range   D-Dimer, Quant 7.15 (H) 0.00 - 0.50 ug/mL-FEU  CBC with Differential/Platelet     Status: Abnormal   Collection Time: 02/16/22  9:08 PM  Result Value Ref Range   WBC 5.0 4.0 - 10.5 K/uL   RBC 3.87 3.87 - 5.11 MIL/uL   Hemoglobin 11.0 (L) 12.0 - 15.0 g/dL   HCT 34.6 (L) 36.0 - 46.0 %   MCV 89.4 80.0 - 100.0 fL   MCH 28.4 26.0 - 34.0 pg   MCHC 31.8 30.0 - 36.0 g/dL   RDW 13.6 11.5 - 15.5 %   Platelets 164 150 - 400 K/uL   nRBC 0.0 0.0 -  0.2 %   Neutrophils Relative % 38 %   Neutro Abs 1.9 1.7 - 7.7 K/uL   Lymphocytes Relative 51 %   Lymphs Abs 2.6 0.7 - 4.0 K/uL   Monocytes Relative 10 %   Monocytes Absolute 0.5 0.1 -  1.0 K/uL   Eosinophils Relative 0 %   Eosinophils Absolute 0.0 0.0 - 0.5 K/uL   Basophils Relative 1 %   Basophils Absolute 0.0 0.0 - 0.1 K/uL   Immature Granulocytes 0 %   Abs Immature Granulocytes 0.01 0.00 - 0.07 K/uL  Troponin I (High Sensitivity)     Status: Abnormal   Collection Time: 02/16/22  9:08 PM  Result Value Ref Range   Troponin I (High Sensitivity) 23 (H) <18 ng/L  Urinalysis, Routine w reflex microscopic     Status: None   Collection Time: 02/16/22 10:54 PM  Result Value Ref Range   Color, Urine YELLOW YELLOW   APPearance CLEAR CLEAR   Specific Gravity, Urine 1.013 1.005 - 1.030   pH 5.0 5.0 - 8.0   Glucose, UA NEGATIVE NEGATIVE mg/dL   Hgb urine dipstick NEGATIVE NEGATIVE   Bilirubin Urine NEGATIVE NEGATIVE   Ketones, ur NEGATIVE NEGATIVE mg/dL   Protein, ur NEGATIVE NEGATIVE mg/dL   Nitrite NEGATIVE NEGATIVE   Leukocytes,Ua NEGATIVE NEGATIVE  Troponin I (High Sensitivity)     Status: Abnormal   Collection Time: 02/16/22 11:55 PM  Result Value Ref Range   Troponin I (High Sensitivity) 21 (H) <18 ng/L   Imaging Studies: DG Chest Port 1 View  Result Date: 02/16/2022 CLINICAL DATA:  Shortness of breath EXAM: PORTABLE CHEST 1 VIEW COMPARISON:  09/27/2017 FINDINGS: The heart size and mediastinal contours are within normal limits. Both lungs are clear. The visualized skeletal structures are unremarkable. IMPRESSION: No active disease. Electronically Signed   By: Donavan Foil M.D.   On: 02/16/2022 21:30    ED COURSE and MDM  Nursing notes, initial and subsequent vitals signs, including pulse oximetry, reviewed and interpreted by myself.  Vitals:   02/17/22 0000 02/17/22 0030 02/17/22 0100 02/17/22 0147  BP: (!) 159/102 (!) 173/88 (!) 180/97 (!) 180/126  Pulse: (!) 53 (!) 51 (!) 51 62  Resp: (!) 24 (!) 22 17 (!) 22  Temp:      SpO2: 99% 99% 100% 95%  Weight:      Height:       Medications  heparin ADULT infusion 100 units/mL (25000 units/229mL) (1,300  Units/hr Intravenous New Bag/Given 02/17/22 0139)  labetalol (NORMODYNE) injection 20 mg (has no administration in time range)  heparin bolus via infusion 4,000 Units (4,000 Units Intravenous Bolus from Bag 02/17/22 0140)    The patient's rhythm strip was reviewed and she had no tachyarrhythmias and very few PVCs noted she has been in sinus bradycardia the whole time, primarily in the 50s but occasionally in the high 40s.  Her D-dimer is significantly elevated at 7.15 and she has a slightly elevated troponin.  This is concerning for PE with heart strain.  Due to her elevated creatinine she is not a candidate for CT angio chest and I believe it would be prudent to have her admitted for a VQ scan and further cardiac monitoring.  1:08 AM Dr. Hal Hope accepts for admission to the hospitalist service.  He request we start heparin for possible pulmonary embolism.   PROCEDURES  Procedures   ED DIAGNOSES     ICD-10-CM   1. Suspected pulmonary embolism  R09.89  2. Palpitations  R00.2     3. Elevated troponin  R77.8     4. Elevated d-dimer  R79.89     5. Hypertension not at goal  [facility], Jenny Reichmann, MD 02/17/22 [phone]

## 2022-02-18 NOTE — Discharge Summary (Signed)
Physician Discharge Summary   Patient: ANELY SPIEWAK MRN: [id number] DOB: [birth year]  Admit date:     02/16/2022  Discharge date: 02/17/2022  Discharge Physician: Geradine Girt   PCP: Pcp, No   Recommendations at discharge:    Outpatient CTA as previously scheduled BMP 1 week  Discharge Diagnoses: Principal Problem:   Chest pain Active Problems:   Hypertension associated with diabetes ([place])   Dyslipidemia associated with type 2 diabetes mellitus (HCC)   Asthma   AKI (acute kidney injury) [facility])   Hospital Course: Julie Gilbert is a 71 y.o. female with medical history significant of asthma, chronic anemia, CKD, CAD, type II diabetes, hyperlipidemia, GERD, hypertension, CAD presented to the ED with complaints of chest pain, shortness of breath, and palpitations  Assessment and Plan: Shortness of breath, chest pain, palpitations PE ruled out with negative V/Q scan, troponin are unremarkable -upon discussion with daughter (symptoms seem to be related to stress- daughter was in the ER yesterday for autoimmune issues and then patient began to have symptoms-- patient also out of antianxiety medications)  -patient feeling better and says her birthday is tomorrow so would like to be d/cd home  AKI on CKD stage IIIa -close outpatient follow up   Asthma Stable, no wheezing.   Diet controlled type 2 diabetes A1c 6.0 a year ago. A1c trending up-- instructed to watch diet closer   Hypertension -resume home meds -patient to bring in log of BP readings to cardiology   Hyperlipidemia -Pharmacy med rec pending.   Migraines -resume home med of topamax   Diet recommendation:  Discharge Diet Orders (From admission, onward)     Start     Ordered   02/17/22 0000  Diet - low sodium heart healthy        02/17/22 1400   02/17/22 0000  Diet Carb Modified        02/17/22 1400           Cardiac and Carb modified diet DISCHARGE MEDICATION: Allergies as of 02/17/2022        Reactions   Peanuts [peanut Oil] Anaphylaxis   Shrimp [shellfish Allergy] Anaphylaxis   Latex Hives   Elemental Sulfur Itching, Swelling   Other Other (See Comments)   Adhesive on EKG pads irritated skin   Penicillins Hives   Has patient had a PCN reaction causing immediate rash, facial/tongue/throat swelling, SOB or lightheadedness with hypotension: No Has patient had a PCN reaction causing severe rash involving mucus membranes or skin necrosis: No Has patient had a PCN reaction that required hospitalization: No Has patient had a PCN reaction occurring within the last 10 years: No If all of the above answers are "NO", then may proceed with Cephalosporin use.   Pineapple Swelling, Other (See Comments)   Scratchy throat, Tongue swelling   Repatha [evolocumab] Other (See Comments)   Dizzy, scratchy eyes/throat, palpitations   Sulfa Antibiotics Hives        Medication List     STOP taking these medications    amLODipine 5 MG tablet Commonly known as: NORVASC   Dexilant 60 MG capsule Generic drug: dexlansoprazole   naproxen sodium 220 MG tablet Commonly known as: ALEVE   promethazine 12.5 MG tablet Commonly known as: PHENERGAN       TAKE these medications    Accu-Chek Aviva Plus w/Device Kit Use as directed to check blood sugar once daily. E11.9   accu-chek soft touch lancets Use as instructed to check blood sugar once daily  E11.9   albuterol 108 (90 Base) MCG/ACT inhaler Commonly known as: VENTOLIN HFA TAKE 2 PUFFS BY MOUTH EVERY 6 HOURS AS NEEDED FOR WHEEZE OR SHORTNESS OF BREATH What changed:  how much to take when to take this reasons to take this additional instructions   aspirin EC 81 MG tablet Take 81 mg by mouth every morning. Swallow whole.   atorvastatin 40 MG tablet Commonly known as: LIPITOR Take 1 tablet (40 mg total) by mouth daily. What changed: when to take this   Blood Pressure Kit Devi 1 application by Does not apply route daily. Use  to check blood pressure daily   cholecalciferol 25 MCG (1000 UNIT) tablet Commonly known as: VITAMIN D3 Take 1,000 Units by mouth at bedtime.   clonazePAM 1 MG tablet Commonly known as: KLONOPIN Take 1 tablet (1 mg total) by mouth 3 (three) times daily as needed for anxiety. What changed:  when to take this reasons to take this   COD LIVER OIL PO Take 1-2 capsules by mouth See admin instructions. Take one capsule by mouth every morning and take two capsules at night   CRANBERRY/PROBIOTIC PO Take 2 capsules by mouth every morning.   Emgality 120 MG/ML Soaj Generic drug: Galcanezumab-gnlm Inject 120 ml monthly. What changed:  how much to take how to take this when to take this additional instructions   escitalopram 10 MG tablet Commonly known as: LEXAPRO Take 1 tablet (10 mg total) by mouth daily. What changed: when to take this   FISH OIL PO Take 1 capsule by mouth at bedtime.   fluticasone 50 MCG/ACT nasal spray Commonly known as: FLONASE Place 1 spray into both nostrils daily as needed for allergies or rhinitis.   GARLIC PO Take 1 tablet by mouth at bedtime.   glucose blood test strip Commonly known as: Accu-Chek Aviva Plus Use as instructed to check blood sugar once daily. E11.9   losartan 25 MG tablet Commonly known as: COZAAR Take 1 tablet (25 mg total) by mouth daily. What changed: when to take this   meclizine 25 MG tablet Commonly known as: ANTIVERT Take 1 tablet (25 mg total) by mouth 3 (three) times daily.   multivitamin with minerals Tabs tablet Take 1 tablet by mouth at bedtime.   Nexlizet 180-10 MG Tabs Generic drug: Bempedoic Acid-Ezetimibe Take 1 tablet by mouth daily. What changed: when to take this   nitroGLYCERIN 0.4 MG SL tablet Commonly known as: NITROSTAT PLACE 1 TABLET UNDER THE TONGUE EVERY 5 MINUTES AS NEEDED FOR CHEST PAIN. What changed: See the new instructions.   OVER THE COUNTER MEDICATION Take 1 capsule by mouth at  bedtime. Mother's nature for menopause   pantoprazole 40 MG tablet Commonly known as: PROTONIX Take 40 mg by mouth every morning.   predniSONE 50 MG tablet Commonly known as: DELTASONE Take 1 tablet 13 hours prior to test, then one 7 hours prior to test, and then one 1 hour prior to test   Symbicort 160-4.5 MCG/ACT inhaler Generic drug: budesonide-formoterol TAKE 2 PUFFS BY MOUTH TWICE A DAY What changed: See the new instructions.   Systane 0.4-0.3 % Soln Generic drug: Polyethyl Glycol-Propyl Glycol Place 1 drop into both eyes daily.   topiramate 50 MG tablet Commonly known as: TOPAMAX Take 1 tablet (50 mg total) by mouth 2 (two) times daily. What changed:  how much to take when to take this additional instructions   vitamin C 1000 MG tablet Take 1,000 mg by mouth at bedtime.  Discharge Exam: Filed Weights   02/16/22 2101 02/17/22 0320  Weight: 81.6 kg 79.2 kg     Condition at discharge: good  The results of significant diagnostics from this hospitalization (including imaging, microbiology, ancillary and laboratory) are listed below for reference.   Imaging Studies: NM Pulmonary Perfusion  Result Date: 02/17/2022 CLINICAL DATA:  Chest pain and shortness of breath question pulmonary embolism EXAM: NUCLEAR MEDICINE PERFUSION LUNG SCAN TECHNIQUE: Perfusion images were obtained in multiple projections after intravenous injection of radiopharmaceutical. Ventilation scans intentionally deferred if perfusion scan and chest x-ray adequate for interpretation during COVID 19 epidemic. RADIOPHARMACEUTICALS:  4 mCi Tc-20m MAA IV COMPARISON:  Chest radiograph 02/16/2022 FINDINGS: Normal perfusion lung scan. No perfusion defects. IMPRESSION: Normal perfusion lung scan. Electronically Signed   By: Lavonia Dana M.D.   On: 02/17/2022 13:08   DG Chest Port 1 View  Result Date: 02/16/2022 CLINICAL DATA:  Shortness of breath EXAM: PORTABLE CHEST 1 VIEW COMPARISON:  09/27/2017  FINDINGS: The heart size and mediastinal contours are within normal limits. Both lungs are clear. The visualized skeletal structures are unremarkable. IMPRESSION: No active disease. Electronically Signed   By: Donavan Foil M.D.   On: 02/16/2022 21:30    Microbiology: No results found for this or any previous visit.  Labs: CBC: Recent Labs  Lab 02/16/22 2108  WBC 5.0  NEUTROABS 1.9  HGB 11.0*  HCT 34.6*  MCV 89.4  PLT 657   Basic Metabolic Panel: Recent Labs  Lab 02/16/22 2108 02/17/22 1004  NA 140 140  K 4.4 4.4  CL 105 110  CO2 23 17*  GLUCOSE 73 78  BUN 21 18  CREATININE 2.39* 2.22*  CALCIUM 9.6 9.7   Liver Function Tests: Recent Labs  Lab 02/16/22 2108  AST 21  ALT 15  ALKPHOS 48  BILITOT 0.7  PROT 7.7  ALBUMIN 4.6   CBG: No results for input(s): GLUCAP in the last 168 hours.  Discharge time spent: greater than 30 minutes.  Signed: Geradine Girt, DO [facility] 02/18/2022

## 2022-02-19 ENCOUNTER — Other Ambulatory Visit: Payer: Self-pay | Admitting: Family Medicine

## 2022-02-22 NOTE — Progress Notes (Unsigned)
I, Christoper Fabian, LAT, ATC, am serving as scribe for Dr. [name].  Julie Gilbert is a 71 y.o. female who presents to [facility] at [facility] today for low back pain w/ radiating pain into her L LE.  She was last seen by Dr. Denyse Amass on 01/18/22 for her final round of B knee Gelsyn injections.  She has a hx of chronic lumbar radiculopathy and had a prior lumbar ESI in 2018.  Today, pt reports low back pain w/ L leg pain x a few months.  She locates her pain to her L lower back, L lateral thigh and L knee pain.  Radiating pain: yes into her L lateral thigh and knee LE paresthesias: yes in her B feet Aggravating factors: walking; leg raises; sitting Treatments tried: Tylenol  Diagnostic testing: L-spine MRI- 04/07/17; L-spine XR- 02/03/17   Pertinent review of systems: No fevers or chills  Relevant historical information: Diabetes   Exam:  BP 120/84 (BP Location: Right Arm, Patient Position: Sitting, Cuff Size: Normal)   Pulse 62   Ht 5\' 7"  (1.702 m)   Wt 175 lb (79.4 kg)   LMP  (LMP Unknown) Comment: Menopausal  SpO2 98%   BMI 27.41 kg/m  General: Well Developed, well nourished, and in no acute distress.   MSK: L-spine: Nontender midline. Decreased lumbar motion. Lower extremity strength 4+/5 hip flexion and knee extension.  Dorsiflexion left foot 4/5. Strength otherwise intact bilateral lower extremities. Reflexes are intact. Slow antalgic gait present.    Lab and Radiology Results  X-ray images L-spine obtained today personally independently interpreted. No acute fractures are visible. Mild degenerative changes present L-spine. Await formal radiology review  EXAM: MRI LUMBAR SPINE WITHOUT CONTRAST   TECHNIQUE: Multiplanar, multisequence MR imaging of the lumbar spine was performed. No intravenous contrast was administered.   COMPARISON:  Lumbar radiographs dated 02/03/2017   FINDINGS: Segmentation:  Standard.   Alignment:  Physiologic.    Vertebrae:  No fracture, evidence of discitis, or bone lesion.   Conus medullaris: Extends to the L1-2 level and appears normal.   Paraspinal and other soft tissues: Negative.   Disc levels:   L1-2: Slight disc desiccation. Tiny diffuse disc bulge with no neural impingement. Slight hypertrophy of the left facet joint and ligamentum flavum.   L2-3: Slight disc desiccation. Tiny broad-based disc bulge with no neural impingement. Slight hypertrophy of the ligamentum flavum bilaterally.   L3-4: Slight disc desiccation. a focal disc bulge into the left lateral recess with a slight mass effect upon the thecal sac and the left L4 nerve rootlets within the thecal sac best seen on image 23 of series 7. This mass effect is slight.   L4-5: Slight disc desiccation. Tiny broad-based disc bulge with no neural impingement. Slight right facet arthritis.   L5-S1: Normal disc. No significant abnormality of the facet joints.   IMPRESSION: 1. Focal small disc bulge into the left lateral recess at L3-4 with a slight mass effect upon the left L4 nerve roots. 2. Minimal degenerative disc disease at L1-2, L2-3, and L4-5.     Electronically Signed   By: 04/05/2017 M.D.   On: 04/07/2017 13:05   I, 04/09/2017, personally (independently) visualized and performed the interpretation of the images attached in this note.    Assessment and Plan: 71 y.o. female with left leg pain thought to be due to lumbar radiculopathy likely L4 perhaps L3.  Symptoms are new and associated with leg weakness.  She does have changes at this level based on MRI 2018.  We discussed options.  Plan for repeat MRI to update MRI from 2018.  This should help dictate future treatment options including epidural steroid injection.  MRI should be updated given new weakness.   PDMP not reviewed this encounter. Orders Placed This Encounter  Procedures   DG Lumbar Spine Complete    Standing Status:   Future    Number of  Occurrences:   1    Standing Expiration Date:   03/25/2022    Order Specific Question:   Reason for Exam (SYMPTOM  OR DIAGNOSIS REQUIRED)    Answer:   low back pain    Order Specific Question:   Preferred imaging location?    Answer:   [facility]   MR Lumbar Spine Wo Contrast    Standing Status:   Future    Standing Expiration Date:   02/24/2023    Order Specific Question:   What is the patient's sedation requirement?    Answer:   No Sedation    Order Specific Question:   Does the patient have a pacemaker or implanted devices?    Answer:   No    Order Specific Question:   Preferred imaging location?    Answer:   [facility] (table limit-350lbs)   No orders of the defined types were placed in this encounter.    Discussed warning signs or symptoms. Please see discharge instructions. Patient expresses understanding.   The above documentation has been reviewed and is accurate and complete [name], M.D.

## 2022-02-23 ENCOUNTER — Encounter: Payer: Self-pay | Admitting: Family Medicine

## 2022-02-23 ENCOUNTER — Telehealth: Payer: Self-pay | Admitting: Cardiovascular Disease

## 2022-02-23 ENCOUNTER — Ambulatory Visit (INDEPENDENT_AMBULATORY_CARE_PROVIDER_SITE_OTHER): Payer: Medicare Other | Admitting: Family Medicine

## 2022-02-23 ENCOUNTER — Telehealth: Payer: Self-pay | Admitting: Radiology

## 2022-02-23 ENCOUNTER — Ambulatory Visit (INDEPENDENT_AMBULATORY_CARE_PROVIDER_SITE_OTHER): Payer: Medicare Other

## 2022-02-23 VITALS — BP 120/84 | HR 62 | Ht 67.0 in | Wt 175.0 lb

## 2022-02-23 DIAGNOSIS — M5416 Radiculopathy, lumbar region: Secondary | ICD-10-CM

## 2022-02-23 DIAGNOSIS — M545 Low back pain, unspecified: Secondary | ICD-10-CM | POA: Diagnosis not present

## 2022-02-23 DIAGNOSIS — R29898 Other symptoms and signs involving the musculoskeletal system: Secondary | ICD-10-CM | POA: Diagnosis not present

## 2022-02-23 MED ORDER — LOSARTAN POTASSIUM 25 MG PO TABS: 25.0000 mg | ORAL_TABLET | Freq: Every day | ORAL | 2 refills | Status: DC

## 2022-02-23 NOTE — Telephone Encounter (Signed)
Rx(s) sent to pharmacy electronically.  

## 2022-02-23 NOTE — Patient Instructions (Signed)
Good to see you today.  I've ordered an MRI of your lumbar spine.  That facility will call you to schedule but please let us know if you don't hear from them in one week regarding scheduling.  Follow-up: after MRI

## 2022-02-23 NOTE — Telephone Encounter (Signed)
*  STAT* If patient is at the pharmacy, call can be transferred to refill team.   1. Which medications need to be refilled? (please list name of each medication and dose if known)   losartan (COZAAR) 25 MG tablet    2. Which pharmacy/location (including street and city if local pharmacy) is medication to be sent to?  [id number] - [place], [place] [address]. Do they need a 30 day or 90 day supply?  90 day

## 2022-02-24 MED ORDER — LORAZEPAM 0.5 MG PO TABS: ORAL_TABLET | ORAL | 0 refills | Status: DC

## 2022-02-24 NOTE — Telephone Encounter (Signed)
Atvan prescribed for anxiety for MRI

## 2022-02-25 NOTE — Progress Notes (Signed)
Lumbar spine x-ray shows arthritis changes.  It does not look significantly different from x-ray of her low back that you had in 2018.

## 2022-02-28 ENCOUNTER — Telehealth (HOSPITAL_BASED_OUTPATIENT_CLINIC_OR_DEPARTMENT_OTHER): Payer: Self-pay | Admitting: Cardiovascular Disease

## 2022-02-28 MED ORDER — NEXLIZET 180-10 MG PO TABS: 1.0000 | ORAL_TABLET | Freq: Every day | ORAL | 3 refills | Status: DC

## 2022-02-28 NOTE — Telephone Encounter (Signed)
Pt c/o medication issue:  1. Name of Medication: Nexlizet 10 mg  2. How are you currently taking this medication (dosage and times per day)? 1 a day  3. Are you having a reaction (difficulty breathing--STAT)?   4. What is your medication issue?  Wants to know if patient is to continue taking this medicine? If so, she will need a prescription please

## 2022-03-01 ENCOUNTER — Ambulatory Visit (HOSPITAL_BASED_OUTPATIENT_CLINIC_OR_DEPARTMENT_OTHER): Payer: Medicaid Other | Admitting: Cardiovascular Disease

## 2022-03-01 ENCOUNTER — Ambulatory Visit (INDEPENDENT_AMBULATORY_CARE_PROVIDER_SITE_OTHER): Payer: Medicare Other

## 2022-03-01 DIAGNOSIS — M5126 Other intervertebral disc displacement, lumbar region: Secondary | ICD-10-CM | POA: Diagnosis not present

## 2022-03-01 DIAGNOSIS — M5416 Radiculopathy, lumbar region: Secondary | ICD-10-CM

## 2022-03-02 ENCOUNTER — Telehealth: Payer: Self-pay | Admitting: Family Medicine

## 2022-03-02 DIAGNOSIS — M5416 Radiculopathy, lumbar region: Secondary | ICD-10-CM

## 2022-03-02 DIAGNOSIS — R29898 Other symptoms and signs involving the musculoskeletal system: Secondary | ICD-10-CM

## 2022-03-02 NOTE — Telephone Encounter (Signed)
Epidural steroid injection ordered 

## 2022-03-02 NOTE — Progress Notes (Signed)
MRI lumbar spine shows bulging disc impacting the L3 or L4 nerve root on the left.  This could cause leg pain and weakness that you are experiencing.  I have ordered an epidural steroid injection which should help.  Please contact [facility] at [phone] to schedule the injection. Please schedule a follow-up appointment with me to go over the results of the MRI and talk about the back injection I ordered.

## 2022-03-03 NOTE — Telephone Encounter (Signed)
Patient called back to say that she cant afford this medication. She stated that our office need to call the insurance company. Please advise

## 2022-03-04 ENCOUNTER — Ambulatory Visit
Admission: RE | Admit: 2022-03-04 | Discharge: 2022-03-04 | Disposition: A | Discharge status: Home / Self Care | Payer: Medicare Other | Source: Ambulatory Visit | Attending: Family Medicine | Admitting: Family Medicine

## 2022-03-04 DIAGNOSIS — R29898 Other symptoms and signs involving the musculoskeletal system: Secondary | ICD-10-CM

## 2022-03-04 DIAGNOSIS — M5416 Radiculopathy, lumbar region: Secondary | ICD-10-CM

## 2022-03-04 DIAGNOSIS — M4727 Other spondylosis with radiculopathy, lumbosacral region: Secondary | ICD-10-CM | POA: Diagnosis not present

## 2022-03-04 MED ORDER — IOPAMIDOL (ISOVUE-M 200) INJECTION 41%
1.0000 mL | Freq: Once | INTRAMUSCULAR | Status: AC
  Administered 2022-03-04: 1 mL via EPIDURAL

## 2022-03-04 MED ORDER — METHYLPREDNISOLONE ACETATE 40 MG/ML INJ SUSP (RADIOLOG
80.0000 mg | Freq: Once | INTRAMUSCULAR | Status: AC
  Administered 2022-03-04: 80 mg via EPIDURAL

## 2022-03-04 NOTE — Telephone Encounter (Signed)
Spoke with pharmacy, patient needs PA - have received fax Tried to start PA however with the insurance card in system, card information not found Left message for patient to call back to get card information  Tried to call pharmacy, on hold for over 15 minutes

## 2022-03-04 NOTE — Discharge Instructions (Signed)

## 2022-03-07 DIAGNOSIS — N6019 Diffuse cystic mastopathy of unspecified breast: Secondary | ICD-10-CM | POA: Diagnosis not present

## 2022-03-07 DIAGNOSIS — Z1389 Encounter for screening for other disorder: Secondary | ICD-10-CM | POA: Diagnosis not present

## 2022-03-07 DIAGNOSIS — Z130 Encounter for screening for diseases of the blood and blood-forming organs and certain disorders involving the immune mechanism: Secondary | ICD-10-CM | POA: Diagnosis not present

## 2022-03-10 ENCOUNTER — Other Ambulatory Visit: Payer: Self-pay | Admitting: Family Medicine

## 2022-03-10 ENCOUNTER — Other Ambulatory Visit: Payer: Self-pay | Admitting: Obstetrics and Gynecology

## 2022-03-10 DIAGNOSIS — Z1231 Encounter for screening mammogram for malignant neoplasm of breast: Secondary | ICD-10-CM

## 2022-03-11 ENCOUNTER — Other Ambulatory Visit: Payer: Self-pay | Admitting: Obstetrics and Gynecology

## 2022-03-11 DIAGNOSIS — N6002 Solitary cyst of left breast: Secondary | ICD-10-CM

## 2022-03-11 DIAGNOSIS — N6009 Solitary cyst of unspecified breast: Secondary | ICD-10-CM

## 2022-03-16 NOTE — Telephone Encounter (Signed)
Geraldo Docker Key: [id number] - PA Case ID: [id number] Need help?   Status Sent to Plan today  Drug Nexlizet 180-10MG  tablets  Form [employer] Medicare Part D Electronic Prior Authorization Form (2017 NCPDP)  Waiting for determination.

## 2022-03-21 ENCOUNTER — Other Ambulatory Visit: Payer: Self-pay | Admitting: Family Medicine

## 2022-03-23 ENCOUNTER — Ambulatory Visit (HOSPITAL_COMMUNITY): Admission: RE | Admit: 2022-03-23 | Payer: Medicare Other | Source: Ambulatory Visit

## 2022-03-23 ENCOUNTER — Encounter (HOSPITAL_COMMUNITY): Payer: Self-pay

## 2022-03-24 ENCOUNTER — Encounter: Payer: Self-pay | Admitting: *Deleted

## 2022-03-24 ENCOUNTER — Telehealth: Payer: Self-pay | Admitting: *Deleted

## 2022-03-24 DIAGNOSIS — G43709 Chronic migraine without aura, not intractable, without status migrainosus: Secondary | ICD-10-CM

## 2022-03-24 DIAGNOSIS — G43909 Migraine, unspecified, not intractable, without status migrainosus: Secondary | ICD-10-CM

## 2022-03-24 NOTE — Telephone Encounter (Signed)
Received rx requests from [facility] mail order pharmacy for Emgality, Atorvastatin, and Topiramate. Our office does not prescribe Atorvastatin for patient. I called the pt and LVM asking for call back. There is a documented allergy to Repatha which has a same ingredient as Emgality. Need to make sure pt is still doing well on Emgality with no allergy/reactions. Also need to clarify Topiramate dose. Our office states pt takes 50 mg BID but the chart says patient takes 50 mg in AM and 100 mg in PM.

## 2022-03-27 ENCOUNTER — Other Ambulatory Visit: Payer: Self-pay | Admitting: Family Medicine

## 2022-04-01 ENCOUNTER — Telehealth (HOSPITAL_BASED_OUTPATIENT_CLINIC_OR_DEPARTMENT_OTHER): Payer: Self-pay | Admitting: Cardiovascular Disease

## 2022-04-01 NOTE — Telephone Encounter (Signed)
Returned call to patient, unable to leave message. 

## 2022-04-01 NOTE — Telephone Encounter (Signed)
Patient called in to have ekg canel, then stated that she needs to know what going on with her. Please

## 2022-04-04 NOTE — Telephone Encounter (Signed)
2ND CALL ATTEMPT, NO ANSWER, UNABLE TO LEAVE MESSAGE

## 2022-04-04 NOTE — Telephone Encounter (Signed)
3rd call attempt, no answer unable to leave message

## 2022-04-04 NOTE — Progress Notes (Unsigned)
I, Christoper Fabian, LAT, ATC, am serving as scribe for Dr. [name].  Julie Gilbert is a 71 y.o. female who presents to [facility] at [facility] today for f/u L-sided lumbar radiculopathy.  She was last seen by Dr. Denyse Amass on 02/23/22 and an MRI was ordered and latera a lumbar ESI (L L3-4) based on her MRI results.  Today, pt reports that the pain is not as severe as it was but it is still constantly there. Patient is not walking anymore because that is when the pain is the worst. Patient is really want to get back to walking.  Pain radiates from the left low back to the lateral to anterior thigh.  Diagnostic testing: L-spine MrI- 03/01/22; L-spine XR- 02/23/22  Pertinent review of systems: No fevers or chills  Relevant historical information: Hypertension and diabetes.   Exam:  BP 130/80   Pulse 80   Ht 5\' 7"  (1.702 m)   Wt 165 lb (74.8 kg)   LMP  (LMP Unknown) Comment: Menopausal  SpO2 98%   BMI 25.84 kg/m  General: Well Developed, well nourished, and in no acute distress.   MSK: Spine: Nontender midline.  Decreased lumbar motion. Lower extremity strength is intact.    Lab and Radiology Results  EXAM: MRI LUMBAR SPINE WITHOUT CONTRAST   TECHNIQUE: Multiplanar, multisequence MR imaging of the lumbar spine was performed. No intravenous contrast was administered.   COMPARISON:  Plain films lumbar spine 02/23/2022. MRI lumbar spine 04/07/2017.   FINDINGS: Segmentation:  Standard.   Alignment:  Normal.   Vertebrae:  No fracture, evidence of discitis, or bone lesion.   Conus medullaris and cauda equina: Conus extends to the L1-2 level. Conus and cauda equina appear normal.   Paraspinal and other soft tissues: Small bilateral renal cysts again seen.   Disc levels:   T12-L1: Negative.   L1-2: Negative.   L2-3: Minimal disc bulge without stenosis, unchanged.   L3-4: Left subarticular recess and foraminal protrusion on the prior study appears  slightly larger than on the prior MRI and results in mild encroachment on the descending left L4 and exiting left L 3 roots. The right foramen is open.   L4-5: Shallow disc bulge and mild facet arthropathy.  No stenosis.   L5-S1: Negative.   IMPRESSION: Slight increase in the size of a left subarticular recess and foraminal protrusion at L3-4 which could irritate the exiting left L3 or descending left L4 roots.     Electronically Signed   By: 04/09/2017 M.D.   On: 03/02/2022 10:16   I, 05/02/2022, personally (independently) visualized and performed the interpretation of the images attached in this note.      Assessment and Plan: 71 y.o. female with left lumbar radiculopathy at L3 or L4.  Plan for repeat epidural steroid injection.  She had an epidural steroid injection at L3-4 about a month ago.  This helped reasonably well.  I am hopeful that a second shot will help even more.  Recheck back as needed.  Reviewed the MRI and discussed treatment plan options. Total encounter time 30 minutes including face-to-face time with the patient and, reviewing past medical record, and charting on the date of service.      PDMP not reviewed this encounter. Orders Placed This Encounter  Procedures   DG INJECT DIAG/THERA/INC NEEDLE/CATH/PLC EPI/LUMB/SAC W/IMG    LUMB EPI #2 [employer] MCR-no auth req per online//js   PACS (03/01/22) 175 LBS *ASA 81MG *-PT AWARE TO  STOP 3 DAYS* NO NEEDS PT AWARE NO SHOW FEE    Standing Status:   Future    Standing Expiration Date:   04/06/2023    Order Specific Question:   Reason for Exam (SYMPTOM  OR DIAGNOSIS REQUIRED)    Answer:   repeat ESI left L3 or L4. Level and technique per radiogy    Order Specific Question:   Preferred Imaging Location?    Answer:   [id number]    Order Specific Question:   Radiology Contrast Protocol - do NOT remove file path    Answer:   [email or web address]   No orders of the defined  types were placed in this encounter.    Discussed warning signs or symptoms. Please see discharge instructions. Patient expresses understanding.   The above documentation has been reviewed and is accurate and complete [name], M.D.

## 2022-04-05 ENCOUNTER — Ambulatory Visit (HOSPITAL_BASED_OUTPATIENT_CLINIC_OR_DEPARTMENT_OTHER): Payer: Medicare Other | Admitting: Family

## 2022-04-05 ENCOUNTER — Ambulatory Visit (INDEPENDENT_AMBULATORY_CARE_PROVIDER_SITE_OTHER): Payer: Medicare Other | Admitting: Family Medicine

## 2022-04-05 VITALS — BP 130/80 | HR 80 | Ht 67.0 in | Wt 165.0 lb

## 2022-04-05 DIAGNOSIS — M5416 Radiculopathy, lumbar region: Secondary | ICD-10-CM | POA: Diagnosis not present

## 2022-04-05 NOTE — Patient Instructions (Signed)
Thank you for coming in today.   Please call [facility] at [phone] to schedule your spine injection.    Recheck as needed .  Let me know if this is not working.

## 2022-04-06 ENCOUNTER — Other Ambulatory Visit: Payer: Self-pay | Admitting: *Deleted

## 2022-04-06 DIAGNOSIS — G43709 Chronic migraine without aura, not intractable, without status migrainosus: Secondary | ICD-10-CM

## 2022-04-06 DIAGNOSIS — G43909 Migraine, unspecified, not intractable, without status migrainosus: Secondary | ICD-10-CM

## 2022-04-07 ENCOUNTER — Other Ambulatory Visit: Payer: Self-pay | Admitting: *Deleted

## 2022-04-07 DIAGNOSIS — G43909 Migraine, unspecified, not intractable, without status migrainosus: Secondary | ICD-10-CM

## 2022-04-07 DIAGNOSIS — G43709 Chronic migraine without aura, not intractable, without status migrainosus: Secondary | ICD-10-CM

## 2022-04-07 NOTE — Telephone Encounter (Signed)
I called pt this am.  She is taking topiramate 50mg  po am and 100mg  po pm, per she stated from Dr. .  I told her will address when in for appt 04-19-2022 as did no see this documented as increase.  [facility]  I told her that is not problem, just need clarification.  Also atorvastatin would be by pcp.  She verbalized understanding. And was ok to wait.   Did not ask her about emgality (how she is doing on this.  Address at appt.

## 2022-04-18 ENCOUNTER — Other Ambulatory Visit: Payer: Self-pay | Admitting: Family Medicine

## 2022-04-18 ENCOUNTER — Other Ambulatory Visit: Payer: Medicare Other

## 2022-04-18 ENCOUNTER — Ambulatory Visit
Admission: RE | Admit: 2022-04-18 | Discharge: 2022-04-18 | Disposition: A | Discharge status: Home / Self Care | Payer: Medicare Other | Source: Ambulatory Visit | Attending: Family Medicine | Admitting: Family Medicine

## 2022-04-18 DIAGNOSIS — M5416 Radiculopathy, lumbar region: Secondary | ICD-10-CM

## 2022-04-18 MED ORDER — METHYLPREDNISOLONE ACETATE 40 MG/ML INJ SUSP (RADIOLOG: 80.0000 mg | Freq: Once | INTRAMUSCULAR | Status: DC

## 2022-04-18 MED ORDER — IOPAMIDOL (ISOVUE-M 200) INJECTION 41%: 1.0000 mL | Freq: Once | INTRAMUSCULAR | Status: DC

## 2022-04-18 NOTE — Progress Notes (Unsigned)
No chief complaint on file.   HISTORY OF PRESENT ILLNESS:  04/18/22 ALL:  Julie Gilbert is a 71 y.o. female here today for follow up for migraines. She continues emgality and topiramate. Dose?    HISTORY (copied from Dr Cathren Laine previous note)  10/19/2021: Emgality has been working great. Roselyn Meier made her sick. She has been doing excellent on the [employer]. She was a little dizzy on standing, her BP slightly elevated, we had to get her a wheelchair. Today she feels bad and has been feeling bad since the repatha. Roselyn Meier does not work when she has a migraine. She stayed on her Topamax and she was doing well. Patient not feeling well after taking repatha, looks ill today, says she has been feeling unwell since taking it, her BP is slightly elevated, she appears to be tremulous, I called [facility] and they have an 8 hour wait. [facility] and no wait time but I was able to call her pcp Dr. Wynetta Emery and got her an appointment at 330. Daughter is here as well and provides information, she has not been feeling well since Repatha. She has been doing fantastic on Emgality, barely 2 total headaches a month   HPI 05/20/2021:  Julie Gilbert is a 71 y.o. female here as requested by Vivi Barrack, MD for migraines. PMHx migraine, anxiety, asthma, chronic kidney disease, coronary artery disease, diabetes, dyslipidemia, hypertension, depression, osteoarthritis, lacunar stroke, B12 deficiency, vertigo. Here with dauhter who also provides information. She is doing a lot better with her migraines. She has a cardiologist. She is on a heart monitor. Her migraines cn be severe in the left side back and side, pulsating/pounding/throbbing/, pulsating/pounding/throbbing, photophobia/phonophobia, most severe pain is the back in the left, the Emgality helps a lot, she is also on Topamax. Extremely severe. Nausea, she would pass out it was so severe. Used to have daily headaches, weather can be a trigger, a lot less maybe  about 6 a month, was not able to get the [place].  Mother had tremors. With action and posture. Order Roselyn Meier for acute management. Preventative Topamax and emgality. Discussed botox.No other focal neurologic deficits, associated symptoms, inciting events or modifiable factors.   Reviewed notes, labs and imaging from outside physicians, which showed:   From a thorough review of records, medications tried that can be used in migraine management include: Amlodipine (calcium channel blocker similar to verapamil), aspirin, Fioricet, Voltaren tablet, Benadryl, Emgality, Lexapro, gabapentin, meclizine, melatonin, Reglan injections, naproxen, Zofran, prednisone tablets, Phenergan tablets, Imitrex, Topamax, trazodone, propranolol contraindicated due to asthma, amitriptyline/nortrio contraindicated due to being on lexapro, TRIPTANS CONTRAINDICATED due to strokes   REVIEW OF SYSTEMS: Out of a complete 14 system review of symptoms, the patient complains only of the following symptoms, and all other reviewed systems are negative.   ALLERGIES: Allergies  Allergen Reactions   Peanuts [Peanut Oil] Anaphylaxis   Shrimp [Shellfish Allergy] Anaphylaxis   Latex Hives   Elemental Sulfur Itching and Swelling   Iodinated Contrast Media Hives    Pt reported she had hives after recent procedure w/ CT contrast.    Other Other (See Comments)    Adhesive on EKG pads irritated skin   Penicillins Hives    Has patient had a PCN reaction causing immediate rash, facial/tongue/throat swelling, SOB or lightheadedness with hypotension: No Has patient had a PCN reaction causing severe rash involving mucus membranes or skin necrosis: No Has patient had a PCN reaction that required hospitalization: No Has patient had  a PCN reaction occurring within the last 10 years: No  If all of the above answers are "NO", then may proceed with Cephalosporin use.     Pineapple Swelling and Other (See Comments)    Scratchy throat, Tongue  swelling   Repatha [Evolocumab] Other (See Comments)    Dizzy, scratchy eyes/throat, palpitations   Sulfa Antibiotics Hives     HOME MEDICATIONS: Outpatient Medications Prior to Visit  Medication Sig Dispense Refill   albuterol (VENTOLIN HFA) 108 (90 Base) MCG/ACT inhaler TAKE 2 PUFFS BY MOUTH EVERY 6 HOURS AS NEEDED FOR WHEEZE OR SHORTNESS OF BREATH 6.7 each 0   Ascorbic Acid (VITAMIN C) 1000 MG tablet Take 1,000 mg by mouth at bedtime.     aspirin EC 81 MG tablet Take 81 mg by mouth every morning. Swallow whole.     atorvastatin (LIPITOR) 40 MG tablet Take 1 tablet (40 mg total) by mouth daily. (Patient taking differently: Take 40 mg by mouth every morning.) 90 tablet 3   Bempedoic Acid-Ezetimibe (NEXLIZET) 180-10 MG TABS Take 1 tablet by mouth daily. 90 tablet 3   Blood Glucose Monitoring Suppl (ACCU-CHEK AVIVA PLUS) w/Device KIT Use as directed to check blood sugar once daily. E11.9 1 kit 0   Blood Pressure Monitoring (BLOOD PRESSURE KIT) DEVI 1 application by Does not apply route daily. Use to check blood pressure daily 1 Device 0   cholecalciferol (VITAMIN D3) 25 MCG (1000 UNIT) tablet Take 1,000 Units by mouth at bedtime.     clonazePAM (KLONOPIN) 1 MG tablet Take 1 tablet (1 mg total) by mouth 3 (three) times daily as needed for anxiety. 20 tablet 0   COD LIVER OIL PO Take 1-2 capsules by mouth See admin instructions. Take one capsule by mouth every morning and take two capsules at night     EMGALITY 120 MG/ML SOAJ Inject 120 ml monthly. (Patient taking differently: Inject 120 mg into the skin every 30 (thirty) days. On or about the 1st of each month) 1.12 mL 11   escitalopram (LEXAPRO) 10 MG tablet Take 1 tablet (10 mg total) by mouth daily. (Patient taking differently: Take 10 mg by mouth every morning.) 90 tablet 3   fluticasone (FLONASE) 50 MCG/ACT nasal spray Place 1 spray into both nostrils daily as needed for allergies or rhinitis.     GARLIC PO Take 1 tablet by mouth at  bedtime.     glucose blood (ACCU-CHEK AVIVA PLUS) test strip Use as instructed to check blood sugar once daily. E11.9 100 each 12   Lancets (ACCU-CHEK SOFT TOUCH) lancets Use as instructed to check blood sugar once daily E11.9 100 each 12   LORazepam (ATIVAN) 0.5 MG tablet 1-2 tabs 30 - 60 min prior to MRI. Do not drive with this medicine. 4 tablet 0   losartan (COZAAR) 25 MG tablet Take 1 tablet (25 mg total) by mouth daily. 90 tablet 2   meclizine (ANTIVERT) 25 MG tablet Take 1 tablet (25 mg total) by mouth 3 (three) times daily. 180 tablet 3   Misc Natural Products (CRANBERRY/PROBIOTIC PO) Take 2 capsules by mouth every morning.     Multiple Vitamin (MULTIVITAMIN WITH MINERALS) TABS tablet Take 1 tablet by mouth at bedtime.     nitroGLYCERIN (NITROSTAT) 0.4 MG SL tablet PLACE 1 TABLET UNDER THE TONGUE EVERY 5 MINUTES AS NEEDED FOR CHEST PAIN. (Patient taking differently: 0.4 mg every 5 (five) minutes as needed for chest pain.) 25 tablet 10   Omega-3 Fatty Acids (FISH  OIL PO) Take 1 capsule by mouth at bedtime.     OVER THE COUNTER MEDICATION Take 1 capsule by mouth at bedtime. Mother's nature for menopause     pantoprazole (PROTONIX) 40 MG tablet Take 40 mg by mouth every morning.     Polyethyl Glycol-Propyl Glycol (SYSTANE) 0.4-0.3 % SOLN Place 1 drop into both eyes daily.     SYMBICORT 160-4.5 MCG/ACT inhaler TAKE 2 PUFFS BY MOUTH TWICE A DAY (Patient taking differently: 2 puffs 2 (two) times daily as needed (shortness of breath/wheezing).) 30.6 each 3   topiramate (TOPAMAX) 50 MG tablet Take 1 tablet (50 mg total) by mouth 2 (two) times daily. (Patient taking differently: Take 50-100 mg by mouth See admin instructions. Take one tablet (50 mg) by mouth every morning and two tablets (100 mg) at night) 180 tablet 4   Facility-Administered Medications Prior to Visit  Medication Dose Route Frequency Provider Last Rate Last Admin   iopamidol (ISOVUE-M) 41 % intrathecal injection 1 mL  1 mL Epidural  Once Gregor Hams, MD       methylPREDNISolone acetate (DEPO-MEDROL) injection (RADIOLOGY ONLY) 80 mg  80 mg Epidural Once Gregor Hams, MD         PAST MEDICAL HISTORY: Past Medical History:  Diagnosis Date   Anxiety 05/18/2018   Asthma    Chronic anemia 06/29/2016   Chronic pain of both knees 02/15/2019   CKD stage 3 due to type 2 diabetes mellitus ([place]) 05/18/2018   Coronary artery disease 10/01/2018   Diabetes mellitus without complication ([place])    Dyslipidemia associated with type 2 diabetes mellitus ([place]) 05/18/2018   Lab Results Component Value Date  CHOL 198 08/17/2018  HDL 62.40 08/17/2018  LDLCALC 113 (H) 08/17/2018  TRIG 113.0 08/17/2018  CHOLHDL 3 08/17/2018     Gastroesophageal reflux disease without esophagitis 06/18/2015   Herniated intervertebral disc of lumbar spine 05/01/2017   Hypertension    Hypertension associated with diabetes ([place]) 05/13/2015   IBS (irritable bowel syndrome)    Insomnia 05/13/2015   Migraine headache 05/13/2015   Moderate episode of recurrent major depressive disorder ([place]) 01/04/2017   Osteoarthritis of spine 02/03/2017   Transient ischemic attack (TIA) 06/28/2016   Type 2 diabetes mellitus with neurological complications ([place]) 12/27/4740   Lab Results Component Value Date  HGBA1C 5.9 08/17/2018  HGBA1C 5.8 01/04/2017  HGBA1C 6.4 (H) 06/29/2016  Lab Results Component Value Date  MICROALBUR 10.3 (H) 08/17/2018  LDLCALC 113 (H) 08/17/2018  CREATININE 1.56 (H) 08/17/2018       PAST SURGICAL HISTORY: Past Surgical History:  Procedure Laterality Date   CESAREAN SECTION     EXPLORATORY LAPAROTOMY       FAMILY HISTORY: Family History  Problem Relation Age of Onset   Kidney failure Mother    Hypertension Mother    Heart disease Father    Cancer Maternal Grandfather    Migraines Other    Breast cancer Neg Hx      SOCIAL HISTORY: Social History   Socioeconomic History   Marital status: Divorced    Spouse name: Not on file   Number of children:  Not on file   Years of education: Not on file   Highest education level: Not on file  Occupational History   Not on file  Tobacco Use   Smoking status: Former    Types: Cigarettes    Quit date: 09/22/2012    Years since quitting: 9.5   Smokeless tobacco: Never  Vaping Use  Vaping Use: Never used  Substance and Sexual Activity   Alcohol use: Yes    Alcohol/week: 1.0 standard drink of alcohol    Types: 1 Glasses of wine per week    Comment: red wine 1-2 x month   Drug use: No   Sexual activity: Not Currently  Other Topics Concern   Not on file  Social History Narrative   Lives with daughter Sadie Hazelett)    Right handed   Caffeine: none    Social Determinants of Health   Financial Resource Strain: Not on file  Food Insecurity: Not on file  Transportation Needs: Not on file  Physical Activity: Not on file  Stress: Not on file  Social Connections: Not on file  Intimate Partner Violence: Not on file     PHYSICAL EXAM  There were no vitals filed for this visit. There is no height or weight on file to calculate BMI.  Generalized: Well developed, in no acute distress  Cardiology: normal rate and rhythm, no murmur auscultated  Respiratory: clear to auscultation bilaterally    Neurological examination  Mentation: Alert oriented to time, place, history taking. Follows all commands speech and language fluent Cranial nerve II-XII: Pupils were equal round reactive to light. Extraocular movements were full, visual field were full on confrontational test. Facial sensation and strength were normal. Uvula tongue midline. Head turning and shoulder shrug  were normal and symmetric. Motor: The motor testing reveals 5 over 5 strength of all 4 extremities. Good symmetric motor tone is noted throughout.  Sensory: Sensory testing is intact to soft touch on all 4 extremities. No evidence of extinction is noted.  Coordination: Cerebellar testing reveals good finger-nose-finger and  heel-to-shin bilaterally.  Gait and station: Gait is normal. Tandem gait is normal. Romberg is negative. No drift is seen.  Reflexes: Deep tendon reflexes are symmetric and normal bilaterally.    DIAGNOSTIC DATA (LABS, IMAGING, TESTING) - I reviewed patient records, labs, notes, testing and imaging myself where available.  Lab Results  Component Value Date   WBC 5.0 02/16/2022   HGB 11.0 (L) 02/16/2022   HCT 34.6 (L) 02/16/2022   MCV 89.4 02/16/2022   PLT 164 02/16/2022      Component Value Date/Time   NA 140 02/17/2022 1004   NA 140 10/04/2021 1101   K 4.4 02/17/2022 1004   CL 110 02/17/2022 1004   CO2 17 (L) 02/17/2022 1004   GLUCOSE 78 02/17/2022 1004   BUN 18 02/17/2022 1004   BUN 23 10/04/2021 1101   CREATININE 2.22 (H) 02/17/2022 1004   CREATININE 1.71 (H) 07/05/2018 1231   CREATININE 1.10 (H) 01/09/2017 1130   CALCIUM 9.7 02/17/2022 1004   PROT 7.7 02/16/2022 2108   PROT 6.8 09/21/2021 0803   ALBUMIN 4.6 02/16/2022 2108   ALBUMIN 4.4 09/21/2021 0803   AST 21 02/16/2022 2108   AST 20 07/05/2018 1231   ALT 15 02/16/2022 2108   ALT 12 07/05/2018 1231   ALKPHOS 48 02/16/2022 2108   BILITOT 0.7 02/16/2022 2108   BILITOT 0.3 09/21/2021 0803   BILITOT 0.7 07/05/2018 1231   GFRNONAA 24 (L) 02/17/2022 1004   GFRNONAA 30 (L) 07/05/2018 1231   GFRNONAA 54 (L) 01/09/2017 1130   GFRAA 39 (L) 12/06/2019 1045   GFRAA 35 (L) 07/05/2018 1231   GFRAA 62 01/09/2017 1130   Lab Results  Component Value Date   CHOL 196 09/21/2021   HDL 69 09/21/2021   LDLCALC 111 (H) 09/21/2021   TRIG  92 09/21/2021   CHOLHDL 2.8 09/21/2021   Lab Results  Component Value Date   HGBA1C 6.1 (H) 02/17/2022   Lab Results  Component Value Date   VITAMINB12 >1550 (H) 02/11/2021   Lab Results  Component Value Date   TSH 2.48 02/11/2021        No data to display               No data to display           ASSESSMENT AND PLAN  71 y.o. year old female  has a past medical  history of Anxiety (05/18/2018), Asthma, Chronic anemia (06/29/2016), Chronic pain of both knees (02/15/2019), CKD stage 3 due to type 2 diabetes mellitus ([facility]) (05/18/2018), Coronary artery disease (10/01/2018), Diabetes mellitus without complication ([place]), Dyslipidemia associated with type 2 diabetes mellitus ([place]) (05/18/2018), Gastroesophageal reflux disease without esophagitis (06/18/2015), Herniated intervertebral disc of lumbar spine (05/01/2017), Hypertension, Hypertension associated with diabetes ([place]) (05/13/2015), IBS (irritable bowel syndrome), Insomnia (05/13/2015), Migraine headache (05/13/2015), Moderate episode of recurrent major depressive disorder ([place]) (01/04/2017), Osteoarthritis of spine (02/03/2017), Transient ischemic attack (TIA) (06/28/2016), and Type 2 diabetes mellitus with neurological complications ([place]) (3/91/2258). here with    No diagnosis found.  Kyra Searles ***.  Healthy lifestyle habits encouraged. *** will follow up with PCP as directed. *** will return to see me in ***, sooner if needed. *** verbalizes understanding and agreement with this plan.   No orders of the defined types were placed in this encounter.    No orders of the defined types were placed in this encounter.    Debbora Presto, MSN, FNP-C 04/18/2022, 4:14 PM  [facility] [address], [place], [place] [address] [phone]

## 2022-04-18 NOTE — Discharge Instructions (Signed)

## 2022-04-18 NOTE — Patient Instructions (Signed)
Below is our plan:  We will continue Emgality monthly and topiramate 50mg  in the am and 100mg  in the evenings.   Please make sure you are staying well hydrated. I recommend 50-60 ounces daily. Well balanced diet and regular exercise encouraged. Consistent sleep schedule with 6-8 hours recommended.   Please continue follow up with care team as directed.   Follow up with me in 1 year   You may receive a survey regarding today's visit. I encourage you to leave honest feed back as I do use this information to improve patient care. Thank you for seeing me today!

## 2022-04-19 ENCOUNTER — Encounter: Payer: Self-pay | Admitting: Family Medicine

## 2022-04-19 ENCOUNTER — Ambulatory Visit (INDEPENDENT_AMBULATORY_CARE_PROVIDER_SITE_OTHER): Payer: Medicare Other | Admitting: Family Medicine

## 2022-04-19 VITALS — BP 158/94 | HR 58 | Ht 67.0 in | Wt 167.5 lb

## 2022-04-19 DIAGNOSIS — G43709 Chronic migraine without aura, not intractable, without status migrainosus: Secondary | ICD-10-CM | POA: Diagnosis not present

## 2022-04-19 DIAGNOSIS — G43909 Migraine, unspecified, not intractable, without status migrainosus: Secondary | ICD-10-CM

## 2022-04-19 MED ORDER — TOPIRAMATE 50 MG PO TABS: ORAL_TABLET | ORAL | 3 refills | Status: DC

## 2022-04-20 ENCOUNTER — Ambulatory Visit
Admission: RE | Admit: 2022-04-20 | Discharge: 2022-04-20 | Disposition: A | Discharge status: Home / Self Care | Payer: Medicare Other | Source: Ambulatory Visit | Attending: Family Medicine | Admitting: Family Medicine

## 2022-04-20 DIAGNOSIS — M5116 Intervertebral disc disorders with radiculopathy, lumbar region: Secondary | ICD-10-CM | POA: Diagnosis not present

## 2022-04-20 DIAGNOSIS — M5416 Radiculopathy, lumbar region: Secondary | ICD-10-CM

## 2022-04-20 MED ORDER — IOPAMIDOL (ISOVUE-M 200) INJECTION 41%
1.0000 mL | Freq: Once | INTRAMUSCULAR | Status: AC
  Administered 2022-04-20: 1 mL via EPIDURAL

## 2022-04-20 MED ORDER — METHYLPREDNISOLONE ACETATE 40 MG/ML INJ SUSP (RADIOLOG
80.0000 mg | Freq: Once | INTRAMUSCULAR | Status: AC
  Administered 2022-04-20: 80 mg via EPIDURAL

## 2022-04-20 NOTE — Discharge Instructions (Signed)

## 2022-04-25 ENCOUNTER — Ambulatory Visit
Admission: RE | Admit: 2022-04-25 | Discharge: 2022-04-25 | Disposition: A | Discharge status: Home / Self Care | Payer: Medicare Other | Source: Ambulatory Visit | Attending: Obstetrics and Gynecology | Admitting: Obstetrics and Gynecology

## 2022-04-25 DIAGNOSIS — Z1231 Encounter for screening mammogram for malignant neoplasm of breast: Secondary | ICD-10-CM

## 2022-04-27 ENCOUNTER — Other Ambulatory Visit: Payer: Self-pay | Admitting: Obstetrics and Gynecology

## 2022-04-27 DIAGNOSIS — R928 Other abnormal and inconclusive findings on diagnostic imaging of breast: Secondary | ICD-10-CM

## 2022-04-28 ENCOUNTER — Other Ambulatory Visit: Payer: Self-pay | Admitting: Family Medicine

## 2022-04-28 ENCOUNTER — Other Ambulatory Visit: Payer: Self-pay | Admitting: *Deleted

## 2022-04-28 NOTE — Patient Outreach (Signed)
[facility]) Care Management  04/28/2022  [name] 04-Oct-1950 [id number]    Care Management   Outreach Note  04/28/2022 Name: Julie Gilbert MRN: [id number] DOB: 10-14-1950  Unsuccessful with the initial outreach today.  Follow Up Plan:  A HIPAA compliant phone message was left for the patient providing contact information and requesting a return call.  The care management team will reach out to the patient again over the next 7 days.   Elliot Cousin, RN Care Management Coordinator [facility] Main Office [phone]

## 2022-05-02 ENCOUNTER — Ambulatory Visit (INDEPENDENT_AMBULATORY_CARE_PROVIDER_SITE_OTHER): Payer: Medicare Other | Admitting: Family Medicine

## 2022-05-02 VITALS — BP 124/90 | HR 83 | Temp 98.7°F | Ht 68.0 in | Wt 167.8 lb

## 2022-05-02 DIAGNOSIS — E782 Mixed hyperlipidemia: Secondary | ICD-10-CM

## 2022-05-02 DIAGNOSIS — K582 Mixed irritable bowel syndrome: Secondary | ICD-10-CM | POA: Diagnosis not present

## 2022-05-02 DIAGNOSIS — M5126 Other intervertebral disc displacement, lumbar region: Secondary | ICD-10-CM | POA: Diagnosis not present

## 2022-05-02 DIAGNOSIS — J45909 Unspecified asthma, uncomplicated: Secondary | ICD-10-CM

## 2022-05-02 DIAGNOSIS — G43709 Chronic migraine without aura, not intractable, without status migrainosus: Secondary | ICD-10-CM | POA: Diagnosis not present

## 2022-05-02 DIAGNOSIS — H8110 Benign paroxysmal vertigo, unspecified ear: Secondary | ICD-10-CM

## 2022-05-02 DIAGNOSIS — I10 Essential (primary) hypertension: Secondary | ICD-10-CM | POA: Diagnosis not present

## 2022-05-02 DIAGNOSIS — E1149 Type 2 diabetes mellitus with other diabetic neurological complication: Secondary | ICD-10-CM | POA: Diagnosis not present

## 2022-05-02 DIAGNOSIS — G4700 Insomnia, unspecified: Secondary | ICD-10-CM | POA: Diagnosis not present

## 2022-05-02 DIAGNOSIS — J302 Other seasonal allergic rhinitis: Secondary | ICD-10-CM

## 2022-05-02 MED ORDER — FLUTICASONE PROPIONATE 50 MCG/ACT NA SUSP: 1.0000 | Freq: Every day | NASAL | 3 refills | Status: DC | PRN

## 2022-05-02 MED ORDER — MECLIZINE HCL 25 MG PO TABS: 25.0000 mg | ORAL_TABLET | Freq: Three times a day (TID) | ORAL | 3 refills | Status: DC

## 2022-05-02 NOTE — Progress Notes (Signed)
Subjective:    Patient ID: Julie Gilbert, female    DOB: Feb 10, 1951, 71 y.o.   MRN: [id number]  Chief Complaint  Patient presents with   Establish Care  Patient accompanied by her daughter  HPI Patient was seen today for [facility] and numerous ongoing concerns.  Previously seen by [name], MD.  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 -Symptoms started as a child.  Patient states she would collapse. -States dizzy all the time.  Feels like the room is spinning.  Will lay or sit down. -Tried meclizine and Epley's maneuver. -Feels like may have tried PT in [place] without improvement years ago.  IBS: -Mixed -Diet controlled. -Eats grilled or baked foods.  2 May vegetables causes diarrhea -Seen by GI, Dr. Kyla Balzarine in [place], [place]  Asthma/seasonal allergies: -Endorses daily symptoms -Using albuterol 3 times daily -Also using Symbicort 160 -Taking allergy medications Flonase and Alysol?  HLD/CAD: -Lipitor 40 mg daily -Followed by cardiology  Insomnia: -States may get 5 hours of sleep per night -In bed by 12-1 AM.  Last night stayed up around 3 AM -Was taking clonazepam nightly for sleep.  States has been out x 3-4 months.  Inquires about refill. -Patient afraid to wake up dizzy  Migraines: -Taking Emgality. -States no longer taking topiramate -Currently having 1 severe headache per month.  Was 3 severe/mo. -Changes in weather cause headaches -Will have pressure in frontal and occipital areas of head -Will have nausea and vomiting at times, sensitivity to light and sound -Denies blurred vision -Followed by neurology, Dr. Daisy Blossom  History of CVA: -In 2017 -Followed by cardiology  Herniated disc: -Currently receiving injections in back. -Followed by Dr. Denyse Amass, Sports medicine  DM 2: -Dx in 2010 -Diet controlled per patient  Anxiety: -Patient endorses some anxiety she is worried about her daughter's health  as well as her own.  HTN: -Losartan 25 mg daily   Past Medical History:  Diagnosis Date   Anxiety 05/18/2018   Asthma    Chronic anemia 06/29/2016   Chronic pain of both knees 02/15/2019   CKD stage 3 due to type 2 diabetes mellitus (HCC) 05/18/2018   Coronary artery disease 10/01/2018   Diabetes mellitus without complication (HCC)    Dyslipidemia associated with type 2 diabetes mellitus (HCC) 05/18/2018   Lab Results Component Value Date  CHOL 198 08/17/2018  HDL 62.40 08/17/2018  LDLCALC 113 (H) 08/17/2018  TRIG 113.0 08/17/2018  CHOLHDL 3 08/17/2018     Gastroesophageal reflux disease without esophagitis 06/18/2015   Herniated intervertebral disc of lumbar spine 05/01/2017   Hypertension    Hypertension associated with diabetes (HCC) 05/13/2015   IBS (irritable bowel syndrome)    Insomnia 05/13/2015   Migraine headache 05/13/2015   Moderate episode of recurrent major depressive disorder (HCC) 01/04/2017   Osteoarthritis of spine 02/03/2017   Transient ischemic attack (TIA) 06/28/2016   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   Lab Results Component Value Date  HGBA1C 5.9 08/17/2018  HGBA1C 5.8 01/04/2017  HGBA1C 6.4 (H) 06/29/2016  Lab Results Component Value Date  MICROALBUR 10.3 (H) 08/17/2018  LDLCALC 113 (H) 08/17/2018  CREATININE 1.56 (H) 08/17/2018      Allergies  Allergen Reactions   Peanuts [Peanut Oil] Anaphylaxis   Shrimp [Shellfish Allergy] Anaphylaxis   Latex Hives   Elemental Sulfur Itching and Swelling   Iodinated Contrast Media Hives    Pt reported she had  hives after recent procedure w/ CT contrast.    Other Other (See Comments)    Adhesive on EKG pads irritated skin   Penicillins Hives    Has patient had a PCN reaction causing immediate rash, facial/tongue/throat swelling, SOB or lightheadedness with hypotension: No Has patient had a PCN reaction causing severe rash involving mucus membranes or skin necrosis: No Has patient had a PCN  reaction that required hospitalization: No Has patient had a PCN reaction occurring within the last 10 years: No  If all of the above answers are "NO", then may proceed with Cephalosporin use.     Pineapple Swelling and Other (See Comments)    Scratchy throat, Tongue swelling   Repatha [Evolocumab] Other (See Comments)    Dizzy, scratchy eyes/throat, palpitations   Sulfa Antibiotics Hives    ROS General: Denies fever, chills, night sweats, changes in weight, changes in appetite + insomnia HEENT: Denies headaches, ear pain, changes in vision, rhinorrhea, sore throat + migraines CV: Denies CP, palpitations, SOB, orthopnea Pulm: Denies SOB, cough, wheezing GI: Denies abdominal pain, nausea, vomiting +diarrhea, constipation GU: Denies dysuria, hematuria, frequency, vaginal discharge Msk: Denies muscle cramps, joint pains + back pain Neuro: Denies weakness, numbness, tingling Skin: Denies rashes, bruising Psych: Denies depression, anxiety, hallucinations + anxiety     Objective:    Blood pressure (!) 124/90, pulse 83, temperature 98.7 F (37.1 C), temperature source Oral, height 5\' 8"  (1.727 m), weight 167 lb 12.8 oz (76.1 kg), SpO2 98 %.  Gen. Pleasant, well-nourished, in no distress, normal affect   HEENT: Geraldine/AT, face symmetric, conjunctiva clear, no scleral icterus, PERRLA, EOMI, nares patent without drainage Lungs: no accessory muscle use, CTAB, no wheezes or rales Cardiovascular: RRR, no m/r/g, no peripheral edema Musculoskeletal: No deformities, no cyanosis or clubbing, normal tone Neuro:  A&Ox3, CN II-XII intact, normal gait Skin:  Warm, no lesions/ rash   Wt Readings from Last 3 Encounters:  05/02/22 167 lb 12.8 oz (76.1 kg)  04/19/22 167 lb 8 oz (76 kg)  04/05/22 165 lb (74.8 kg)    Lab Results  Component Value Date   WBC 5.0 02/16/2022   HGB 11.0 (L) 02/16/2022   HCT 34.6 (L) 02/16/2022   PLT 164 02/16/2022   GLUCOSE 78 02/17/2022   CHOL 196 09/21/2021    TRIG 92 09/21/2021   HDL 69 09/21/2021   LDLCALC 111 (H) 09/21/2021   ALT 15 02/16/2022   AST 21 02/16/2022   NA 140 02/17/2022   K 4.4 02/17/2022   CL 110 02/17/2022   CREATININE 2.22 (H) 02/17/2022   BUN 18 02/17/2022   CO2 17 (L) 02/17/2022   TSH 2.48 02/11/2021   INR 0.94 06/28/2016   HGBA1C 6.1 (H) 02/17/2022   MICROALBUR 8.1 (H) 08/29/2019    Assessment/Plan:  Essential hypertension -Uncontrolled -Discussed the importance of lifestyle modifications -Continue current medication including losartan 25 mg daily -Patient encouraged to check BP at home for consistent elevation greater than 140/90 increase medication.  Benign paroxysmal positional vertigo, unspecified laterality -Given patient's chronic vertigo symptoms vestibular rehab advised. - Plan: meclizine (ANTIVERT) 25 MG tablet, Ambulatory referral to Physical Therapy  Insomnia, unspecified type -Sleep hygiene strongly encouraged -Advised against frequent use of Klonopin for sleep. -per chart review rx written for Klonopin 1 mg TID prn, however pt was taking 3 mg qhs. -Encouraged to make appointment with psychiatry for management  Uncomplicated asthma, unspecified asthma severity, unspecified whether persistent -albuterol prn  Seasonal allergies  - Plan: fluticasone (FLONASE)  50 MCG/ACT nasal spray  Lumbar herniated disc -Continue follow-up with sports medicine, Dr. Denyse Amass  Mixed hyperlipidemia -Total cholesterol 196, HDL 69, LDL 111, triglycerides 92 on 09/21/21 -Lifestyle modifications encouraged -Continue Lipitor 40 mg daily -Continue follow-up with cardiology  Chronic migraine without aura without status migrainosus, not intractable -Continue Emgality -Discussed headache prevention -Continue follow-up with neurology, Dr. Lucia Gaskins  Irritable bowel syndrome with both constipation and diarrhea -Continue controlling with diet -Continue follow-up with GI, Dr. Kyla Balzarine in [place], [place]  Type 2 diabetes mellitus  with neurological complications (HCC) -Hemoglobin A1c 6.1% on 02/17/2022 -Lifestyle modifications encouraged  F/u in the next few weeks as needed  Abbe Amsterdam, MD

## 2022-05-03 ENCOUNTER — Ambulatory Visit
Admission: RE | Admit: 2022-05-03 | Discharge: 2022-05-03 | Disposition: A | Discharge status: Home / Self Care | Payer: Medicare Other | Source: Ambulatory Visit | Attending: Obstetrics and Gynecology | Admitting: Obstetrics and Gynecology

## 2022-05-03 ENCOUNTER — Other Ambulatory Visit: Payer: Self-pay | Admitting: Obstetrics and Gynecology

## 2022-05-03 DIAGNOSIS — R928 Other abnormal and inconclusive findings on diagnostic imaging of breast: Secondary | ICD-10-CM

## 2022-05-03 DIAGNOSIS — N6489 Other specified disorders of breast: Secondary | ICD-10-CM

## 2022-05-05 ENCOUNTER — Telehealth: Payer: Self-pay | Admitting: Family Medicine

## 2022-05-05 NOTE — Telephone Encounter (Signed)
Left message for patient to call back and schedule Medicare Annual Wellness Visit (AWV) either virtually or in office. Left  my Zachery Conch number [phone]   Last AWV ;08/09/19  please schedule at anytime with [facility] Nurse Health Advisor 1 or 2

## 2022-05-12 ENCOUNTER — Ambulatory Visit (INDEPENDENT_AMBULATORY_CARE_PROVIDER_SITE_OTHER): Payer: Medicare Other

## 2022-05-12 VITALS — Ht 68.0 in | Wt 167.0 lb

## 2022-05-12 DIAGNOSIS — Z0000 Encounter for general adult medical examination without abnormal findings: Secondary | ICD-10-CM | POA: Diagnosis not present

## 2022-05-12 DIAGNOSIS — Z13820 Encounter for screening for osteoporosis: Secondary | ICD-10-CM

## 2022-05-12 NOTE — Patient Instructions (Addendum)
Julie Gilbert , Thank you for taking time to come for your Medicare Wellness Visit. I appreciate your ongoing commitment to your health goals. Please review the following plan we discussed and let me know if I can assist you in the future.   These are the goals we discussed:  Goals       Stay healthy (pt-stated)      I would like to become more independent.        This is a list of the screening recommended for you and due dates:  Health Maintenance  Topic Date Due   Complete foot exam   12/13/2016   DEXA scan (bone density measurement)  Never done   COVID-19 Vaccine (3 - Pfizer risk series) 02/05/2020   Eye exam for diabetics  12/09/2020   Zoster (Shingles) Vaccine (2 of 2) 09/23/2021   Pneumonia Vaccine (1 - PCV) 05/13/2023*   Hemoglobin A1C  08/20/2022   Mammogram  04/26/2023   Colon Cancer Screening  04/26/2028   Hepatitis C Screening: USPSTF Recommendation to screen - Ages 18-79 yo.  Completed   HPV Vaccine  Aged Out   Tetanus Vaccine  Discontinued  *Topic was postponed. The date shown is not the original due date.   Advanced directives: Yes  Conditions/risks identified: None  Next appointment: Follow up in one year for your annual wellness visit     Preventive Care 65 Years and Older, Female Preventive care refers to lifestyle choices and visits with your health care provider that can promote health and wellness. What does preventive care include? A yearly physical exam. This is also called an annual well check. Dental exams once or twice a year. Routine eye exams. Ask your health care provider how often you should have your eyes checked. Personal lifestyle choices, including: Daily care of your teeth and gums. Regular physical activity. Eating a healthy diet. Avoiding tobacco and drug use. Limiting alcohol use. Practicing safe sex. Taking low-dose aspirin every day. Taking vitamin and mineral supplements as recommended by your health care provider. What  happens during an annual well check? The services and screenings done by your health care provider during your annual well check will depend on your age, overall health, lifestyle risk factors, and family history of disease. Counseling  Your health care provider may ask you questions about your: Alcohol use. Tobacco use. Drug use. Emotional well-being. Home and relationship well-being. Sexual activity. Eating habits. History of falls. Memory and ability to understand (cognition). Work and work Astronomer. Reproductive health. Screening  You may have the following tests or measurements: Height, weight, and BMI. Blood pressure. Lipid and cholesterol levels. These may be checked every 5 years, or more frequently if you are over 30 years old. Skin check. Lung cancer screening. You may have this screening every year starting at age 42 if you have a 30-pack-year history of smoking and currently smoke or have quit within the past 15 years. Fecal occult blood test (FOBT) of the stool. You may have this test every year starting at age 36. Flexible sigmoidoscopy or colonoscopy. You may have a sigmoidoscopy every 5 years or a colonoscopy every 10 years starting at age 27. Hepatitis C blood test. Hepatitis B blood test. Sexually transmitted disease (STD) testing. Diabetes screening. This is done by checking your blood sugar (glucose) after you have not eaten for a while (fasting). You may have this done every 1-3 years. Bone density scan. This is done to screen for osteoporosis. You may have this  done starting at age 95. Mammogram. This may be done every 1-2 years. Talk to your health care provider about how often you should have regular mammograms. Talk with your health care provider about your test results, treatment options, and if necessary, the need for more tests. Vaccines  Your health care provider may recommend certain vaccines, such as: Influenza vaccine. This is recommended every  year. Tetanus, diphtheria, and acellular pertussis (Tdap, Td) vaccine. You may need a Td booster every 10 years. Zoster vaccine. You may need this after age 20. Pneumococcal 13-valent conjugate (PCV13) vaccine. One dose is recommended after age 13. Pneumococcal polysaccharide (PPSV23) vaccine. One dose is recommended after age 81. Talk to your health care provider about which screenings and vaccines you need and how often you need them. This information is not intended to replace advice given to you by your health care provider. Make sure you discuss any questions you have with your health care provider. Document Released: 10/09/2015 Document Revised: 06/01/2016 Document Reviewed: 07/14/2015 Elsevier Interactive Patient Education  2017 [place] Prevention in the Home Falls can cause injuries. They can happen to people of all ages. There are many things you can do to make your home safe and to help prevent falls. What can I do on the outside of my home? Regularly fix the edges of walkways and driveways and fix any cracks. Remove anything that might make you trip as you walk through a door, such as a raised step or threshold. Trim any bushes or trees on the path to your home. Use bright outdoor lighting. Clear any walking paths of anything that might make someone trip, such as rocks or tools. Regularly check to see if handrails are loose or broken. Make sure that both sides of any steps have handrails. Any raised decks and porches should have guardrails on the edges. Have any leaves, snow, or ice cleared regularly. Use sand or salt on walking paths during winter. Clean up any spills in your garage right away. This includes oil or grease spills. What can I do in the bathroom? Use night lights. Install grab bars by the toilet and in the tub and shower. Do not use towel bars as grab bars. Use non-skid mats or decals in the tub or shower. If you need to sit down in the shower, use a  plastic, non-slip stool. Keep the floor dry. Clean up any water that spills on the floor as soon as it happens. Remove soap buildup in the tub or shower regularly. Attach bath mats securely with double-sided non-slip rug tape. Do not have throw rugs and other things on the floor that can make you trip. What can I do in the bedroom? Use night lights. Make sure that you have a light by your bed that is easy to reach. Do not use any sheets or blankets that are too big for your bed. They should not hang down onto the floor. Have a firm chair that has side arms. You can use this for support while you get dressed. Do not have throw rugs and other things on the floor that can make you trip. What can I do in the kitchen? Clean up any spills right away. Avoid walking on wet floors. Keep items that you use a lot in easy-to-reach places. If you need to reach something above you, use a strong step stool that has a grab bar. Keep electrical cords out of the way. Do not use floor polish or wax  that makes floors slippery. If you must use wax, use non-skid floor wax. Do not have throw rugs and other things on the floor that can make you trip. What can I do with my stairs? Do not leave any items on the stairs. Make sure that there are handrails on both sides of the stairs and use them. Fix handrails that are broken or loose. Make sure that handrails are as long as the stairways. Check any carpeting to make sure that it is firmly attached to the stairs. Fix any carpet that is loose or worn. Avoid having throw rugs at the top or bottom of the stairs. If you do have throw rugs, attach them to the floor with carpet tape. Make sure that you have a light switch at the top of the stairs and the bottom of the stairs. If you do not have them, ask someone to add them for you. What else can I do to help prevent falls? Wear shoes that: Do not have high heels. Have rubber bottoms. Are comfortable and fit you  well. Are closed at the toe. Do not wear sandals. If you use a stepladder: Make sure that it is fully opened. Do not climb a closed stepladder. Make sure that both sides of the stepladder are locked into place. Ask someone to hold it for you, if possible. Clearly mark and make sure that you can see: Any grab bars or handrails. First and last steps. Where the edge of each step is. Use tools that help you move around (mobility aids) if they are needed. These include: Canes. Walkers. Scooters. Crutches. Turn on the lights when you go into a dark area. Replace any light bulbs as soon as they burn out. Set up your furniture so you have a clear path. Avoid moving your furniture around. If any of your floors are uneven, fix them. If there are any pets around you, be aware of where they are. Review your medicines with your doctor. Some medicines can make you feel dizzy. This can increase your chance of falling. Ask your doctor what other things that you can do to help prevent falls. This information is not intended to replace advice given to you by your health care provider. Make sure you discuss any questions you have with your health care provider. Document Released: 07/09/2009 Document Revised: 02/18/2016 Document Reviewed: 10/17/2014 Elsevier Interactive Patient Education  2017 [employer].

## 2022-05-12 NOTE — Progress Notes (Signed)
Subjective:   Julie Gilbert is a 71 y.o. female who presents for Medicare Annual (Subsequent) preventive examination.  Review of Systems    Virtual Visit via Telephone Note  I connected with  Kyra Searles on 05/12/22 at  9:15 AM EDT by telephone and verified that I am speaking with the correct person using two identifiers.  Location: Patient: Home Provider: Office Persons participating in the virtual visit: patient/Nurse Health Advisor   I discussed the limitations, risks, security and privacy concerns of performing an evaluation and management service by telephone and the availability of in person appointments. The patient expressed understanding and agreed to proceed.  Interactive audio and video telecommunications were attempted between this nurse and patient, however failed, due to patient having technical difficulties OR patient did not have access to video capability.  We continued and completed visit with audio only.  Some vital signs may be absent or patient reported.   Criselda Peaches, LPN  Cardiac Risk Factors include: advanced age (>73mn, >>51women);diabetes mellitus;hypertension     Objective:    Today's Vitals   05/12/22 0917  Weight: 167 lb (75.8 kg)  Height: 5' 8" (1.727 m)   Body mass index is 25.39 kg/m.     05/12/2022    9:42 AM 02/16/2022    9:05 PM 08/09/2019    3:06 PM 07/05/2018   12:08 PM 09/27/2017   12:57 PM 04/12/2017   10:05 AM 01/04/2017    9:56 AM  Advanced Directives  Does Patient Have a Medical Advance Directive? Yes Yes No No No Yes No  Type of [place]of [place] will [place] will       Does patient want to make changes to medical advance directive? No - Patient declined        Copy of [place]? Yes - validated most recent copy scanned in chart (See row information)        Would patient like information on creating a medical advance directive?    No - Patient declined No - Patient declined       Current Medications (verified) Outpatient Encounter Medications as of 05/12/2022  Medication Sig   albuterol (VENTOLIN HFA) 108 (90 Base) MCG/ACT inhaler TAKE 2 PUFFS BY MOUTH EVERY 6 HOURS AS NEEDED FOR WHEEZE OR SHORTNESS OF BREATH   Ascorbic Acid (VITAMIN C) 1000 MG tablet Take 1,000 mg by mouth at bedtime.   aspirin EC 81 MG tablet Take 81 mg by mouth every morning. Swallow whole.   atorvastatin (LIPITOR) 40 MG tablet Take 1 tablet (40 mg total) by mouth daily. (Patient taking differently: Take 40 mg by mouth every morning.)   Blood Glucose Monitoring Suppl (ACCU-CHEK AVIVA PLUS) w/Device KIT Use as directed to check blood sugar once daily. E11.9   Blood Pressure Monitoring (BLOOD PRESSURE KIT) DEVI 1 application by Does not apply route daily. Use to check blood pressure daily   cholecalciferol (VITAMIN D3) 25 MCG (1000 UNIT) tablet Take 1,000 Units by mouth at bedtime.   clonazePAM (KLONOPIN) 1 MG tablet Take 1 tablet (1 mg total) by mouth 3 (three) times daily as needed for anxiety.   COD LIVER OIL PO Take 1-2 capsules by mouth See admin instructions. Take one capsule by mouth every morning and take two capsules at night   EMGALITY 120 MG/ML SOAJ Inject 120 ml monthly. (Patient taking differently: Inject 120 mg into the skin every 30 (thirty) days. On  or about the 1st of each month)   escitalopram (LEXAPRO) 10 MG tablet Take 1 tablet (10 mg total) by mouth daily. (Patient taking differently: Take 10 mg by mouth every morning.)   fluticasone (FLONASE) 50 MCG/ACT nasal spray Place 1 spray into both nostrils daily as needed for allergies or rhinitis.   GARLIC PO Take 1 tablet by mouth at bedtime.   glucose blood (ACCU-CHEK AVIVA PLUS) test strip Use as instructed to check blood sugar once daily. E11.9   Lancets (ACCU-CHEK SOFT TOUCH) lancets Use as instructed to check blood sugar once daily E11.9   losartan (COZAAR) 25 MG tablet Take 1 tablet  (25 mg total) by mouth daily.   meclizine (ANTIVERT) 25 MG tablet Take 1 tablet (25 mg total) by mouth 3 (three) times daily.   Misc Natural Products (CRANBERRY/PROBIOTIC PO) Take 2 capsules by mouth every morning.   Multiple Vitamin (MULTIVITAMIN WITH MINERALS) TABS tablet Take 1 tablet by mouth at bedtime.   Omega-3 Fatty Acids (FISH OIL PO) Take 1 capsule by mouth at bedtime.   OVER THE COUNTER MEDICATION Take 1 capsule by mouth at bedtime. Mother's nature for menopause (Patient not taking: Reported on 05/02/2022)   pantoprazole (PROTONIX) 40 MG tablet Take 40 mg by mouth every morning.   SYMBICORT 160-4.5 MCG/ACT inhaler TAKE 2 PUFFS BY MOUTH TWICE A DAY (Patient taking differently: 2 puffs 2 (two) times daily as needed (shortness of breath/wheezing).)   topiramate (TOPAMAX) 50 MG tablet Take 1 tablet (50 mg total) by mouth in the morning AND 2 tablets (100 mg total) at bedtime.   No facility-administered encounter medications on file as of 05/12/2022.    Allergies (verified) Peanuts [peanut oil], Shrimp [shellfish allergy], Latex, Elemental sulfur, Iodinated contrast media, Other, Penicillins, Pineapple, Repatha [evolocumab], and Sulfa antibiotics   History: Past Medical History:  Diagnosis Date   Anxiety 05/18/2018   Asthma    Chronic anemia 06/29/2016   Chronic pain of both knees 02/15/2019   CKD stage 3 due to type 2 diabetes mellitus ([place]) 05/18/2018   Coronary artery disease 10/01/2018   Diabetes mellitus without complication ([place])    Dyslipidemia associated with type 2 diabetes mellitus ([place]) 05/18/2018   Lab Results Component Value Date  CHOL 198 08/17/2018  HDL 62.40 08/17/2018  LDLCALC 113 (H) 08/17/2018  TRIG 113.0 08/17/2018  CHOLHDL 3 08/17/2018     Gastroesophageal reflux disease without esophagitis 06/18/2015   Herniated intervertebral disc of lumbar spine 05/01/2017   Hypertension    Hypertension associated with diabetes ([place]) 05/13/2015   IBS (irritable bowel syndrome)     Insomnia 05/13/2015   Migraine headache 05/13/2015   Moderate episode of recurrent major depressive disorder (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   Lab Results Component Value Date  HGBA1C 5.9 08/17/2018  HGBA1C 5.8 01/04/2017  HGBA1C 6.4 (H) 06/29/2016  Lab Results Component Value Date  MICROALBUR 10.3 (H) 08/17/2018  LDLCALC 113 (H) 08/17/2018  CREATININE 1.56 (H) 08/17/2018     Past Surgical History:  Procedure Laterality Date   CESAREAN SECTION     EXPLORATORY LAPAROTOMY     Family History  Problem Relation Age of Onset   Kidney failure Mother    Hypertension Mother    Heart disease Father    Cancer Maternal Grandfather    Migraines Other    Breast cancer Neg Hx    Social  History   Socioeconomic History   Marital status: Divorced    Spouse name: Not on file   Number of children: Not on file   Years of education: Not on file   Highest education level: Not on file  Occupational History   Not on file  Tobacco Use   Smoking status: Former    Types: Cigarettes    Quit date: 09/22/2012    Years since quitting: 9.6   Smokeless tobacco: Never  Vaping Use   Vaping Use: Never used  Substance and Sexual Activity   Alcohol use: Yes    Alcohol/week: 1.0 standard drink of alcohol    Types: 1 Glasses of wine per week    Comment: red wine 1-2 x month   Drug use: No   Sexual activity: Not Currently  Other Topics Concern   Not on file  Social History Narrative   Lives with daughter Shareese Macha)    Right handed   Caffeine: none    Social Determinants of Health   Financial Resource Strain: Low Risk  (05/12/2022)   Overall Financial Resource Strain (CARDIA)    Difficulty of Paying Living Expenses: Not hard at all  Food Insecurity: No Food Insecurity (05/12/2022)   Hunger Vital Sign    Worried About Running Out of Food in the Last Year: Never  true    Ran Out of Food in the Last Year: Never true  Transportation Needs: No Transportation Needs (05/12/2022)   PRAPARE - Hydrologist (Medical): No    Lack of Transportation (Non-Medical): No  Physical Activity: Insufficiently Active (05/12/2022)   Exercise Vital Sign    Days of Exercise per Week: 3 days    Minutes of Exercise per Session: 40 min  Stress: No Stress Concern Present (05/12/2022)   Parks    Feeling of Stress : Only a little  Social Connections: Moderately Integrated (05/12/2022)   Social Connection and Isolation Panel [NHANES]    Frequency of Communication with Friends and Family: More than three times a week    Frequency of Social Gatherings with Friends and Family: More than three times a week    Attends Religious Services: More than 4 times per year    Active Member of [employer] or Organizations: Yes    Attends Music therapist: More than 4 times per year    Marital Status: Divorced    Tobacco Counseling Counseling given: Not Answered   Clinical Intake:  Pre-visit preparation completed: NoNutrition Risk Assessment:  Has the patient had any N/V/D within the last 2 months?  No  Does the patient have any non-healing wounds?  No  Has the patient had any unintentional weight loss or weight gain?  No   Diabetes:  Is the patient diabetic?  Yes  If diabetic, was a CBG obtained today?  No  Did the patient bring in their glucometer from home?  No  How often do you monitor your CBG's? Every other day.   Financial Strains and Diabetes Management:  Are you having any financial strains with the device, your supplies or your medication? No .  Does the patient want to be seen by Chronic Care Management for management of their diabetes?  No  Would the patient like to be referred to a Nutritionist or for Diabetic Management?  No   Diabetic Exams:  Diabetic Eye  Exam: Completed No. Overdue for diabetic eye exam. Pt has been  advised about the importance in completing this exam. A referral has been placed today. Message sent to referral coordinator for scheduling purposes. Advised pt to expect a call from office referred to regarding appt.  Diabetic Foot Exam: Completed No. Pt has been advised about the importance in completing this exam. Pt is scheduled for diabetic foot exam on Followed by PCP.    Pain : No/denies pain Diabetic?  Yes  Interpreter Needed?: No  Activities of Daily Living    05/12/2022    9:29 AM 02/17/2022    3:20 AM  In your present state of health, do you have any difficulty performing the following activities:  Hearing? 0 0  Vision? 0 0  Difficulty concentrating or making decisions? 0 0  Walking or climbing stairs? 0 1  Dressing or bathing? 0 0  Doing errands, shopping? 0   Preparing Food and eating ? N   Using the Toilet? N   In the past six months, have you accidently leaked urine? Y   Comment Wears breifs. Followed by Gastrologist   Do you have problems with loss of bowel control? Y   Comment DX IBS. Followed by Gastrologist   Managing your Medications? Y   Comment Daughter assist   Managing your Finances? Y   Comment Daughter assist   Housekeeping or managing your Housekeeping? Y   Comment Daughter assist     Patient Care Team: [name], MD as PCP - General (Family Medicine) Belva Crome, MD as PCP - Cardiology (Cardiology) Fonnie Mu, [facility])  Indicate any recent Medical Services you may have received from other than [facility] providers in the past year (date may be approximate).     Assessment:   This is a routine wellness examination for [place].  Hearing/Vision screen Hearing Screening - Comments:: No hearing difficulty Vision Screening - Comments:: Wears glasses. Followed by [place] issues and exercise activities discussed: Exercise limited by: None identified   Goals  Addressed               This Visit's Progress     Stay healthy (pt-stated)        I would like to become more independent.       Depression Screen    05/12/2022    9:27 AM 07/06/2021   10:17 AM 04/22/2021   10:51 AM 04/22/2021   10:50 AM 02/27/2020    9:33 AM 08/09/2019    3:09 PM 08/17/2018   10:03 AM  PHQ 2/9 Scores  PHQ - 2 Score 0 _0 0  4  PHQ- 9 Score  _1 Exception Documentation      Patient refusal     Fall Risk    05/12/2022    9:37 AM 07/06/2021    9:56 AM 04/22/2021   10:09 AM 08/29/2019    8:10 AM 08/09/2019    3:08 PM  Fall Risk   Falls in the past year? _2 0  Number falls in past yr: 1 1 0    Injury with Fall? 1 0 0  0  Comment Patient ststed Bruising w/o medical attention      Risk for fall due to :  History of fall(s) Impaired balance/gait    Follow up Falls prevention discussed;Education provided    Falls evaluation completed;Education provided;Falls prevention discussed    FALL RISK PREVENTION PERTAINING TO THE HOME:  Any stairs in or around the home?  Yes  If so, are there any without handrails? No  Home free of loose throw rugs in walkways, pet beds, electrical cords, etc? Yes  Adequate lighting in your home to reduce risk of falls? Yes   ASSISTIVE DEVICES UTILIZED TO PREVENT FALLS:  Life alert? No  Use of a cane, walker or w/c? Yes  Grab bars in the bathroom? Yes  Shower chair or bench in shower? No  Elevated toilet seat or a handicapped toilet? Yes   TIMED UP AND GO:  Was the test performed? No . Audio Visit  Cognitive Function:      05/12/2022    9:46 AM  6CIT Screen  What Year? 0 points  What month? 0 points  What time? 0 points  Count back from 20 0 points  Months in reverse 2 points  Repeat phrase 4 points  Total Score 6 points    Immunizations Immunization History  Administered Date(s) Administered   PFIZER(Purple Top)SARS-COV-2 Vaccination 12/11/2019, 01/08/2020   Tdap 07/29/2021   Zoster  Recombinat (Shingrix) 07/29/2021    TDAP status: Up to date  Flu Vaccine status: Declined, Education has been provided regarding the importance of this vaccine but patient still declined. Advised may receive this vaccine at local pharmacy or Health Dept. Aware to provide a copy of the vaccination record if obtained from local pharmacy or Health Dept. Verbalized acceptance and understanding.  Pneumococcal vaccine status: Declined,  Education has been provided regarding the importance of this vaccine but patient still declined. Advised may receive this vaccine at local pharmacy or Health Dept. Aware to provide a copy of the vaccination record if obtained from local pharmacy or Health Dept. Verbalized acceptance and understanding.   Covid-19 vaccine status: Completed vaccines  Qualifies for Shingles Vaccine? Yes   Zostavax completed Yes   Shingrix Completed?: Yes  Screening Tests Health Maintenance  Topic Date Due   FOOT EXAM  12/13/2016   DEXA SCAN  Never done   COVID-19 Vaccine (3 - Pfizer risk series) 02/05/2020   OPHTHALMOLOGY EXAM  12/09/2020   Zoster Vaccines- Shingrix (2 of 2) 09/23/2021   Pneumonia Vaccine 57+ Years old (1 - PCV) 05/13/2023 (Originally 02/18/2018)   HEMOGLOBIN A1C  08/20/2022   MAMMOGRAM  04/26/2023   COLONOSCOPY (Pts 45-42yr Insurance coverage will need to be confirmed)  04/26/2028   Hepatitis C Screening  Completed   HPV VACCINES  Aged Out   TETANUS/TDAP  Discontinued    Health Maintenance  Health Maintenance Due  Topic Date Due   FOOT EXAM  12/13/2016   DEXA SCAN  Never done   COVID-19 Vaccine (3 - Pfizer risk series) 02/05/2020   OPHTHALMOLOGY EXAM  12/09/2020   Zoster Vaccines- Shingrix (2 of 2) 09/23/2021    Colorectal cancer screening: Type of screening: Colonoscopy. Completed 04/26/18. Repeat every 10 years  Mammogram status: Completed 04/25/22. Repeat every year  Bone Density status: Ordered 05/12/22. Pt provided with contact info and advised  to call to schedule appt.  Lung Cancer Screening: (Low Dose CT Chest recommended if Age 69-80years, 30 pack-year currently smoking OR have quit w/in 15years.) does not qualify.     Additional Screening:  Hepatitis C Screening: does qualify; Completed 08/17/18  Vision Screening: Recommended annual ophthalmology exams for early detection of glaucoma and other disorders of the eye. Is the patient up to date with their annual eye exam?  Yes  Who is the provider or what is the name of the office in which the patient attends  annual eye exams? [facility] If pt is not established with a provider, would they like to be referred to a provider to establish care? No .   Dental Screening: Recommended annual dental exams for proper oral hygiene  Community Resource Referral / Chronic Care Management:  CRR required this visit?  No   CCM required this visit?  No      Plan:     I have personally reviewed and noted the following in the patient's chart:   Medical and social history Use of alcohol, tobacco or illicit drugs  Current medications and supplements including opioid prescriptions.  Functional ability and status Nutritional status Physical activity Advanced directives List of other physicians Hospitalizations, surgeries, and ER visits in previous 12 months Vitals Screenings to include cognitive, depression, and falls Referrals and appointments  In addition, I have reviewed and discussed with patient certain preventive protocols, quality metrics, and best practice recommendations. A written personalized care plan for preventive services as well as general preventive health recommendations were provided to patient.     Criselda Peaches, LPN   [id number]   Nurse Notes: None

## 2022-05-17 ENCOUNTER — Other Ambulatory Visit: Payer: Self-pay | Admitting: Family Medicine

## 2022-05-17 ENCOUNTER — Encounter: Payer: Self-pay | Admitting: Family Medicine

## 2022-05-23 ENCOUNTER — Other Ambulatory Visit: Payer: Self-pay | Admitting: Family Medicine

## 2022-05-24 ENCOUNTER — Telehealth: Payer: Self-pay | Admitting: Neurology

## 2022-05-24 DIAGNOSIS — G43909 Migraine, unspecified, not intractable, without status migrainosus: Secondary | ICD-10-CM

## 2022-05-24 DIAGNOSIS — G43709 Chronic migraine without aura, not intractable, without status migrainosus: Secondary | ICD-10-CM

## 2022-05-24 MED ORDER — EMGALITY 120 MG/ML ~~LOC~~ SOAJ: SUBCUTANEOUS | 11 refills | Status: DC

## 2022-05-24 NOTE — Telephone Encounter (Signed)
Called pt. [facility]. [facility] She will call back if she has any further issues filling.

## 2022-05-24 NOTE — Telephone Encounter (Signed)
[facility] stating that [facility] no longer [facility]'s Emg[facility]. Pharmacist stated that the pt transferred to [facility] on July 5th 2023

## 2022-05-24 NOTE — Telephone Encounter (Signed)
Pt is calling and stated she went to pharmacy to pick up her prescription for EMGALITY 120 MG/ML SOAJ. Pt was told she needed a prior authorization for medication. Pt is calling to remind Dr. Lucia Gaskins that she need tha authorization. Pt stated she has a headache right now and needs her medication.

## 2022-05-24 NOTE — Addendum Note (Signed)
Addended by: Arther Abbott on: 05/24/2022 02:22 PM   Modules accepted: Orders

## 2022-05-24 NOTE — Telephone Encounter (Signed)
Tried submitting PA on CMM. Key: [id number]. Received the following response: "This medication or product was previously approved on [id number] from 2021-09-26 to 2022-09-25. **Please note: This request was submitted electronically. Formulary lowering, tiering exception, cost reduction and/or pre-benefit determination review (including prospective Medicare hospice reviews) requests cannot be requested using this method of submission. Providers contact us at [phone] for further assistance."  [facility] at [phone]. They were closed for lunch until 2pm. I LVM with approval info.  I called pt and let her know above info. She verbalized understanding and appreciation.

## 2022-05-26 ENCOUNTER — Other Ambulatory Visit: Payer: Self-pay | Admitting: Family Medicine

## 2022-05-26 DIAGNOSIS — J302 Other seasonal allergic rhinitis: Secondary | ICD-10-CM

## 2022-05-27 ENCOUNTER — Telehealth (HOSPITAL_BASED_OUTPATIENT_CLINIC_OR_DEPARTMENT_OTHER): Payer: Self-pay | Admitting: Cardiovascular Disease

## 2022-05-27 ENCOUNTER — Other Ambulatory Visit: Payer: Self-pay | Admitting: Family Medicine

## 2022-05-27 ENCOUNTER — Other Ambulatory Visit: Payer: Self-pay | Admitting: Cardiovascular Disease

## 2022-05-27 MED ORDER — LOSARTAN POTASSIUM 25 MG PO TABS: 25.0000 mg | ORAL_TABLET | Freq: Every day | ORAL | 3 refills | Status: DC

## 2022-05-27 NOTE — Telephone Encounter (Signed)
[facility] and spoke with [facility]  Patient picked up Losartan for 90 day supply 5/31 but then had Rx transferred out to mail order  Tried to call daughter 3 times and number listed said would not go through at this time Called and spoke with patient who stated she had been taking the Losartan but cancelled mail order because she was not getting her prescriptions in time  Asked if she would like updated [facility] and she said yes Offered to call daughter, she said [place] talking was fine I didn't need to call  [facility]

## 2022-05-27 NOTE — Telephone Encounter (Signed)
Please see message below. From Last visit, Dr. Duke Salvia wanted pt to continue Losartan, 3 months ago.

## 2022-05-27 NOTE — Telephone Encounter (Signed)
*  STAT* If patient is at the pharmacy, call can be transferred to refill team.   1. Which medications need to be refilled? (please list name of each medication and dose if known)   losartan (COZAAR) 25 MG tablet  2. Which pharmacy/location (including street and city if local pharmacy) is medication to be sent to?  [id number] [place] [place] - [address]  3. Do they need a 30 day or 90 day supply?   Daughter called stating the patient has been out of this medication for three months.

## 2022-05-31 ENCOUNTER — Ambulatory Visit: Payer: Medicare Other | Admitting: Physical Therapy

## 2022-06-02 DIAGNOSIS — F32A Depression, unspecified: Secondary | ICD-10-CM | POA: Diagnosis not present

## 2022-06-02 DIAGNOSIS — G4700 Insomnia, unspecified: Secondary | ICD-10-CM | POA: Diagnosis not present

## 2022-06-09 ENCOUNTER — Ambulatory Visit: Payer: Self-pay

## 2022-06-09 NOTE — Patient Outreach (Signed)
  Care Coordination   06/09/2022 Name: Julie Gilbert MRN: [id number] DOB: 05-21-1951   Care Coordination Outreach Attempts:  A second unsuccessful outreach was attempted today to offer the patient with information about available care coordination services as a benefit of their health plan.     Follow Up Plan:  Additional outreach attempts will be made to offer the patient care coordination information and services.   Encounter Outcome:  No Answer  Care Coordination Interventions Activated:  No   Care Coordination Interventions:  No, not indicated    Bevelyn Ngo, BSW, CDP Social Worker, Certified Dementia Practitioner [facility] Care Management  Care Coordination [phone]

## 2022-06-17 ENCOUNTER — Ambulatory Visit: Payer: Self-pay

## 2022-06-17 NOTE — Patient Outreach (Signed)
  Care Coordination   06/17/2022 Name: [name] MRN: [id number] DOB: March 07, 1951   Care Coordination Outreach Attempts:  A third unsuccessful outreach was attempted today to offer the patient with information about available care coordination services as a benefit of their health plan.   Follow Up Plan:  No further outreach attempts will be made at this time. We have been unable to contact the patient to offer or enroll patient in care coordination services  Encounter Outcome:  No Answer  Care Coordination Interventions Activated:  No   Care Coordination Interventions:  No, not indicated    Daneen Schick, BSW, CDP Social Worker, Certified Dementia Practitioner [place] Coordination [phone]

## 2022-06-22 ENCOUNTER — Ambulatory Visit (INDEPENDENT_AMBULATORY_CARE_PROVIDER_SITE_OTHER): Payer: Medicare Other | Admitting: Podiatry

## 2022-06-22 ENCOUNTER — Encounter: Payer: Self-pay | Admitting: Podiatry

## 2022-06-22 DIAGNOSIS — E1159 Type 2 diabetes mellitus with other circulatory complications: Secondary | ICD-10-CM | POA: Diagnosis not present

## 2022-06-22 DIAGNOSIS — E119 Type 2 diabetes mellitus without complications: Secondary | ICD-10-CM | POA: Diagnosis not present

## 2022-06-22 DIAGNOSIS — E1149 Type 2 diabetes mellitus with other diabetic neurological complication: Secondary | ICD-10-CM | POA: Diagnosis not present

## 2022-06-22 DIAGNOSIS — R209 Unspecified disturbances of skin sensation: Secondary | ICD-10-CM

## 2022-06-22 DIAGNOSIS — M79674 Pain in right toe(s): Secondary | ICD-10-CM

## 2022-06-22 DIAGNOSIS — I152 Hypertension secondary to endocrine disorders: Secondary | ICD-10-CM

## 2022-06-22 DIAGNOSIS — M79675 Pain in left toe(s): Secondary | ICD-10-CM | POA: Diagnosis not present

## 2022-06-22 DIAGNOSIS — B351 Tinea unguium: Secondary | ICD-10-CM | POA: Diagnosis not present

## 2022-06-22 NOTE — Progress Notes (Signed)
ANNUAL DIABETIC FOOT EXAM  Subjective: Julie Gilbert presents today for annual diabetic foot examination.  Patient confirms h/o diabetes.  Patient relates 6 year h/o diabetes.  Patient denies any h/o foot wounds.  Patient has been diagnosed with neuropathy.  Patient's blood sugar was 112 mg/dl today. Last known  HgA1c was 7.0%   Risk factors: diabetes, diabetic neuropathy, h/o TIA, HTN, CAD, CKD, dyslipidemia, h/o tobacco use in remission.  Patient states her toes feel cold all of the time.  Julie Ruddy, MD is patient's PCP. Last visit was May 02, 2022.  Past Medical History:  Diagnosis Date   Anxiety 05/18/2018   Asthma    Chronic anemia 06/29/2016   Chronic pain of both knees 02/15/2019   CKD stage 3 due to type 2 diabetes mellitus ([place]) 05/18/2018   Coronary artery disease 10/01/2018   Diabetes mellitus without complication ([place])    Dyslipidemia associated with type 2 diabetes mellitus ([place]) 05/18/2018   Lab Results Component Value Date  CHOL 198 08/17/2018  HDL 62.40 08/17/2018  LDLCALC 113 (H) 08/17/2018  TRIG 113.0 08/17/2018  CHOLHDL 3 08/17/2018     Gastroesophageal reflux disease without esophagitis 06/18/2015   Herniated intervertebral disc of lumbar spine 05/01/2017   Hypertension    Hypertension associated with diabetes ([place]) 05/13/2015   IBS (irritable bowel syndrome)    Insomnia 05/13/2015   Migraine headache 05/13/2015   Moderate episode of recurrent major depressive disorder ([place]) 01/04/2017   Osteoarthritis of spine 02/03/2017   Transient ischemic attack (TIA) 06/28/2016   Type 2 diabetes mellitus with neurological complications ([place]) 01/02/8118   Lab Results Component Value Date  HGBA1C 5.9 08/17/2018  HGBA1C 5.8 01/04/2017  HGBA1C 6.4 (H) 06/29/2016  Lab Results Component Value Date  MICROALBUR 10.3 (H) 08/17/2018  LDLCALC 113 (H) 08/17/2018  CREATININE 1.56 (H) 08/17/2018     Patient Active Problem List   Diagnosis Date Noted   Chest pain 02/17/2022   AKI  (acute kidney injury) ([place]) 02/17/2022   Chronic migraine without aura without status migrainosus, not intractable 05/20/2021   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   Allergic rhinitis 02/11/2021   Vertigo 02/27/2020   B12 deficiency 02/11/2020   Iron deficiency 02/11/2020   Inflamed seborrheic keratosis 01/30/2020   Asthma 06/27/2019   Chronic pain of both knees 02/15/2019   Coronary artery disease 10/01/2018   Dyslipidemia associated with type 2 diabetes mellitus ([place]) 05/18/2018   Anxiety 05/18/2018   CKD stage 3 due to type 2 diabetes mellitus ([place]) 05/18/2018   Chest pain syndrome 10/27/2017   Herniated intervertebral disc of lumbar spine 05/01/2017   Osteoarthritis of spine 02/03/2017   Moderate episode of recurrent major depressive disorder ([place]) 01/04/2017   Chronic anemia 06/29/2016   Gastroesophageal reflux disease without esophagitis 06/18/2015   Hypertension associated with diabetes ([place]) 05/13/2015   Type 2 diabetes mellitus with neurological complications ([place]) 14/78/2956   IBS (irritable bowel syndrome) 05/13/2015   Migraine headache 05/13/2015   Insomnia 05/13/2015   Past Surgical History:  Procedure Laterality Date   CESAREAN SECTION     EXPLORATORY LAPAROTOMY     Current Outpatient Medications on File Prior to Visit  Medication Sig Dispense Refill   albuterol (VENTOLIN HFA) 108 (90 Base) MCG/ACT inhaler TAKE 2 PUFFS BY MOUTH EVERY 6 HOURS AS NEEDED FOR WHEEZE OR SHORTNESS OF BREATH 8.5 each 1   Ascorbic Acid (VITAMIN C) 1000 MG tablet Take 1,000 mg by mouth at bedtime.     aspirin EC 81  MG tablet Take 81 mg by mouth every morning. Swallow whole.     atorvastatin (LIPITOR) 40 MG tablet Take 1 tablet (40 mg total) by mouth daily. (Patient taking differently: Take 40 mg by mouth every morning.) 90 tablet 3   Blood Glucose Monitoring Suppl (ACCU-CHEK AVIVA PLUS) w/Device KIT Use as directed to check blood sugar once daily. E11.9 1 kit 0   Blood Pressure  Monitoring (BLOOD PRESSURE KIT) DEVI 1 application by Does not apply route daily. Use to check blood pressure daily 1 Device 0   cholecalciferol (VITAMIN D3) 25 MCG (1000 UNIT) tablet Take 1,000 Units by mouth at bedtime.     clonazePAM (KLONOPIN) 1 MG tablet Take 1 tablet (1 mg total) by mouth 3 (three) times daily as needed for anxiety. 20 tablet 0   COD LIVER OIL PO Take 1-2 capsules by mouth See admin instructions. Take one capsule by mouth every morning and take two capsules at night     EMGALITY 120 MG/ML SOAJ Inject 120 ml monthly. 1 mL 11   escitalopram (LEXAPRO) 10 MG tablet Take 1 tablet (10 mg total) by mouth daily. (Patient taking differently: Take 10 mg by mouth every morning.) 90 tablet 3   fluticasone (FLONASE) 50 MCG/ACT nasal spray PLACE 1 SPRAY INTO BOTH NOSTRILS DAILY AS NEEDED FOR ALLERGIES OR RHINITIS. 48 mL 3   GARLIC PO Take 1 tablet by mouth at bedtime.     glucose blood (ACCU-CHEK AVIVA PLUS) test strip Use as instructed to check blood sugar once daily. E11.9 100 each 12   Lancets (ACCU-CHEK SOFT TOUCH) lancets Use as instructed to check blood sugar once daily E11.9 100 each 12   losartan (COZAAR) 25 MG tablet Take 1 tablet (25 mg total) by mouth daily. 90 tablet 3   meclizine (ANTIVERT) 25 MG tablet Take 1 tablet (25 mg total) by mouth 3 (three) times daily. 180 tablet 3   Misc Natural Products (CRANBERRY/PROBIOTIC PO) Take 2 capsules by mouth every morning.     Multiple Vitamin (MULTIVITAMIN WITH MINERALS) TABS tablet Take 1 tablet by mouth at bedtime.     Omega-3 Fatty Acids (FISH OIL PO) Take 1 capsule by mouth at bedtime.     OVER THE COUNTER MEDICATION Take 1 capsule by mouth at bedtime. Mother's nature for menopause (Patient not taking: Reported on 05/02/2022)     pantoprazole (PROTONIX) 40 MG tablet Take 40 mg by mouth every morning.     SYMBICORT 160-4.5 MCG/ACT inhaler TAKE 2 PUFFS BY MOUTH TWICE A DAY (Patient taking differently: 2 puffs 2 (two) times daily as  needed (shortness of breath/wheezing).) 30.6 each 3   topiramate (TOPAMAX) 50 MG tablet Take 1 tablet (50 mg total) by mouth in the morning AND 2 tablets (100 mg total) at bedtime. 270 tablet 3   zolpidem (AMBIEN) 10 MG tablet Take 10 mg by mouth at bedtime.     No current facility-administered medications on file prior to visit.    Allergies  Allergen Reactions   Peanuts [Peanut Oil] Anaphylaxis   Shrimp [Shellfish Allergy] Anaphylaxis   Latex Hives   Elemental Sulfur Itching and Swelling   Iodinated Contrast Media Hives    Pt reported she had hives after recent procedure w/ CT contrast.    Other Other (See Comments)    Adhesive on EKG pads irritated skin   Penicillins Hives    Has patient had a PCN reaction causing immediate rash, facial/tongue/throat swelling, SOB or lightheadedness with hypotension: No Has  patient had a PCN reaction causing severe rash involving mucus membranes or skin necrosis: No Has patient had a PCN reaction that required hospitalization: No Has patient had a PCN reaction occurring within the last 10 years: No  If all of the above answers are "NO", then may proceed with Cephalosporin use.     Pineapple Swelling and Other (See Comments)    Scratchy throat, Tongue swelling   Repatha [Evolocumab] Other (See Comments)    Dizzy, scratchy eyes/throat, palpitations   Sulfa Antibiotics Hives   Social History   Occupational History   Not on file  Tobacco Use   Smoking status: Former    Types: Cigarettes    Quit date: 09/22/2012    Years since quitting: 9.7   Smokeless tobacco: Never  Vaping Use   Vaping Use: Never used  Substance and Sexual Activity   Alcohol use: Yes    Alcohol/week: 1.0 standard drink of alcohol    Types: 1 Glasses of wine per week    Comment: red wine 1-2 x month   Drug use: No   Sexual activity: Not Currently   Family History  Problem Relation Age of Onset   Kidney failure Mother    Hypertension Mother    Heart disease Father     Cancer Maternal Grandfather    Migraines Other    Breast cancer Neg Hx    Immunization History  Administered Date(s) Administered   PFIZER(Purple Top)SARS-COV-2 Vaccination 12/11/2019, 01/08/2020   Tdap 07/29/2021   Zoster Recombinat (Shingrix) 07/29/2021     Review of Systems: Negative except as noted in the HPI.   Objective: There were no vitals filed for this visit.  Julie Gilbert is a pleasant 71 y.o. female in NAD. AAO X 3.  Vascular Examination: Capillary refill time immediate b/l.Vascular status intact b/l with palpable pedal pulses. Pedal hair present b/l. No edema. No pain with calf compression b/l. Skin temperature gradient warm to cool b/l. No clubbing or cyanosis noted b/l.  Neurological Examination: Sensation grossly intact b/l with 10 gram monofilament. Vibratory sensation intact b/l. Pt has subjective symptoms of neuropathy.  Dermatological Examination: Pedal skin with normal turgor, texture and tone b/l. Toenails right great toe and 2-5 b/l thick, discolored, elongated with subungual debris and pain on dorsal palpation. Residual nail spicule noted left great toe.  Musculoskeletal Examination: Muscle strength 5/5 to all LE muscle groups of right lower extremity. Muscle strength 4/5to all LE muscle groups of left lower extremity.  Radiographs: None  Last A1c:      Latest Ref Rng & Units 02/17/2022   10:04 AM 07/06/2021   10:02 AM  Hemoglobin A1C  Hemoglobin-A1c 4.8 - 5.6 % 6.1  5.1    Footwear Assessment: Does the patient wear appropriate shoes? Yes. Does the patient need inserts/orthotics? No.  ADA Risk Categorization: Low Risk :  Patient has all of the following: Intact protective sensation No prior foot ulcer  No severe deformity Pedal pulses present  Assessment: 1. Pain due to onychomycosis of toenails of both feet   2. Hypertension associated with diabetes ([place])   3. Pain in toes of both feet   4. Cold feet   5. Type 2 diabetes mellitus  with neurological complications ([place])   6. Encounter for diabetic foot exam [facility])     Plan: -Patient was evaluated and treated. All patient's and/or POA's questions/concerns answered on today's visit. -Due to risk factor(s), ordered noninvasive arterial studies ABIs with and without TBIs for b/l lower  extremities. -Diabetic foot examination performed today. -Stressed the importance of good glycemic control and the detriment of not  controlling glucose levels in relation to the foot. -Patient to continue soft, supportive shoe gear daily. -Toenails 2-5 bilaterally and right great toe debrided in length and girth without iatrogenic bleeding with sterile nail nipper and dremel.  -Nail spicule removed left great toe. -Patient/POA to call should there be question/concern in the interim. Return in about 3 months (around 09/21/2022).  Marzetta Board, DPM

## 2022-07-01 ENCOUNTER — Telehealth (HOSPITAL_BASED_OUTPATIENT_CLINIC_OR_DEPARTMENT_OTHER): Payer: Self-pay

## 2022-07-01 DIAGNOSIS — F32A Depression, unspecified: Secondary | ICD-10-CM | POA: Diagnosis not present

## 2022-07-01 DIAGNOSIS — G4700 Insomnia, unspecified: Secondary | ICD-10-CM | POA: Diagnosis not present

## 2022-07-01 DIAGNOSIS — E119 Type 2 diabetes mellitus without complications: Secondary | ICD-10-CM | POA: Diagnosis not present

## 2022-07-01 DIAGNOSIS — E559 Vitamin D deficiency, unspecified: Secondary | ICD-10-CM | POA: Diagnosis not present

## 2022-07-01 DIAGNOSIS — R5383 Other fatigue: Secondary | ICD-10-CM | POA: Diagnosis not present

## 2022-07-01 DIAGNOSIS — M5432 Sciatica, left side: Secondary | ICD-10-CM | POA: Diagnosis not present

## 2022-07-01 DIAGNOSIS — Z0000 Encounter for general adult medical examination without abnormal findings: Secondary | ICD-10-CM | POA: Diagnosis not present

## 2022-07-01 MED ORDER — ATORVASTATIN CALCIUM 40 MG PO TABS: 40.0000 mg | ORAL_TABLET | Freq: Every day | ORAL | 3 refills | Status: DC

## 2022-07-01 NOTE — Telephone Encounter (Signed)
Spoke with [facility] and Atorvastatin was transferred to mail order in June, Rosuvastatin placed inactive prior to that When patient saw Dr Oval Linsey 5/11 was taking Atorvastatin, [facility]

## 2022-07-01 NOTE — Telephone Encounter (Signed)
Received fax from [facility] stating pt is requesting a new Rx or Rx for the correct cholesterol medication. Medication is not specified. Please advise.

## 2022-07-01 NOTE — Therapy (Signed)
OUTPATIENT PHYSICAL THERAPY VESTIBULAR EVALUATION     Patient Name: Julie Gilbert MRN: [id number] DOB:[birth year], 71 y.o., female Today's Date: 07/04/2022  PCP: [name], MD REFERRING PROVIDER: [name], MD   PT End of Session - 07/04/22 1101     Visit Number 1    Number of Visits 13    Date for PT Re-Evaluation 08/15/22    Authorization Type UHC Medicare/Medicaid    PT Start Time 1014    PT Stop Time 1100    PT Time Calculation (min) 46 min    Activity Tolerance Patient tolerated treatment well    Behavior During Therapy [facility] for tasks assessed/performed             Past Medical History:  Diagnosis Date   Anxiety 05/18/2018   Asthma    Chronic anemia 06/29/2016   Chronic pain of both knees 02/15/2019   CKD stage 3 due to type 2 diabetes mellitus ([place]) 05/18/2018   Coronary artery disease 10/01/2018   Diabetes mellitus without complication ([place])    Dyslipidemia associated with type 2 diabetes mellitus ([place]) 05/18/2018   Lab Results Component Value Date  CHOL 198 08/17/2018  HDL 62.40 08/17/2018  LDLCALC 113 (H) 08/17/2018  TRIG 113.0 08/17/2018  CHOLHDL 3 08/17/2018     Gastroesophageal reflux disease without esophagitis 06/18/2015   Herniated intervertebral disc of lumbar spine 05/01/2017   Hypertension    Hypertension associated with diabetes ([place]) 05/13/2015   IBS (irritable bowel syndrome)    Insomnia 05/13/2015   Migraine headache 05/13/2015   Moderate episode of recurrent major depressive disorder ([place]) 01/04/2017   Osteoarthritis of spine 02/03/2017   Transient ischemic attack (TIA) 06/28/2016   Type 2 diabetes mellitus with neurological complications (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   Allergic rhinitis 02/11/2021   Vertigo 02/27/2020   B12 deficiency 02/11/2020   Iron deficiency 02/11/2020   Inflamed seborrheic keratosis 01/30/2020   Asthma 06/27/2019   Chronic pain of both knees 02/15/2019   Coronary artery disease 10/01/2018   Dyslipidemia associated with type 2 diabetes mellitus ([place]) 05/18/2018   Anxiety 05/18/2018   CKD stage 3 due to type 2 diabetes mellitus ([place]) 05/18/2018   Chest pain syndrome 10/27/2017   Herniated intervertebral disc of lumbar spine 05/01/2017   Osteoarthritis of spine 02/03/2017   Moderate episode of recurrent major depressive disorder ([place]) 01/04/2017   Chronic anemia 06/29/2016   Gastroesophageal reflux disease without esophagitis 06/18/2015   Hypertension associated with diabetes ([place]) 05/13/2015   Type 2 diabetes mellitus with neurological complications ([place]) [id number]   IBS (irritable bowel syndrome) 05/13/2015   Migraine headache 05/13/2015   Insomnia 05/13/2015    ONSET DATE: started at 71 years old  REFERRING DIAG: H81.10 (ICD-10-CM) - Benign paroxysmal positional vertigo, unspecified laterality  THERAPY DIAG:  Dizziness and giddiness  Unsteadiness on feet  Rationale for Evaluation and Treatment Rehabilitation  SUBJECTIVE:   SUBJECTIVE STATEMENT: Patient reports that she doesn't cook as much as she used to d/t feeling unsteady. Reports vertigo ever since she was 15. Reports that starting at this age she would start "falling out, collapsing" and years later she was diagnosed with vertigo. Reports that it is getting worse with age. Described dizziness as "off balance, sick on my stomach." Dizziness varies- last week she  had it all week long. Did not have any over the weekend. Dizziness lasts days and worse with "too much movement". Better when sitting or laying down. Migraines occur daily/nightly. Does not necessarily get dizzy with migraines. Denies infection/illness, vision changes/double vision, hearing loss, tinnitus, otalgia. Reports falling down 2 flights of stairs about 10 years ago and suspects that she had head trauma.   Pt accompanied by: self  PERTINENT HISTORY: anxiety, asthma, anemia, CKD, DMII, HLD, GERD, HTN, migraines, depression, TIA   PAIN:  Are you having pain? No  PRECAUTIONS: Fall  WEIGHT BEARING RESTRICTIONS No  FALLS: Has patient fallen in last 6 months? Yes. Number of falls 2  LIVING ENVIRONMENT: Lives with: lives with their daughter Lives in: House/apartment- town home Stairs: No Has following equipment at home: Single point cane and Grab bars  PLOF: Independent with basic ADLs; daughter does cooking, cleaning, and assists with bathing; patient is on disability for migraines   PATIENT GOALS "to become a little more stable"  OBJECTIVE:   DIAGNOSTIC FINDINGS: none recent  COGNITION: Overall cognitive status: No family/caregiver present to determine baseline cognitive functioning   SENSATION: WFL  COORDINATION:  Alternating pronation/supination: B Ues slightly dysmetric Alternating toe tap: mild intention tremor B Finger to nose: considerable dysmetria on L>R LE   POSTURE: rounded shoulders and forward head; head tilted to R   GAIT: Gait pattern:  B toe out with reduced gait speed and step length Assistive device utilized: Single point cane Level of assistance: SBA   FUNCTIONAL TESTs:  5 times sit to stand: 28.08 sec pushing off B knees and unable to fully extend knees  PATIENT SURVEYS:  FOTO [phone]   VESTIBULAR ASSESSMENT   GENERAL OBSERVATION: patient is wearing bifocals which she wears most of the time     OCULOMOTOR EXAM:   Ocular Alignment:  normal   Ocular ROM: No Limitations   Spontaneous Nystagmus: absent   Gaze-Induced Nystagmus: direction changing    Smooth Pursuits:  multiple saccades in horizontal and vertical directions   Saccades:  1-2 saccades in vertical direction (normal for age)   Convergence/Divergence: <4 cm    VESTIBULAR - OCULAR REFLEX:    Slow VOR: Normal; c/o slightly increased dizziness horizontal and vertical   VOR Cancellation: Comment: B corrective saccades and difficulty maintaining focus; c/o dizziness    Head-Impulse Test: HIT Right: negative HIT Left: negative  *c/o mild dizziness     POSITIONAL TESTING:  NT d/t time       VESTIBULAR TREATMENT:   PATIENT EDUCATION: Education details: prognosis, POC, HEP and intended level of dizziness with HEP Person educated: Patient Education method: Explanation, Demonstration, Tactile cues, Verbal cues, and Handouts Education comprehension: verbalized understanding and returned demonstration   GOALS: Goals reviewed with patient? Yes  SHORT TERM GOALS: Target date: 07/25/2022  Patient to be independent with initial HEP. Baseline: HEP initiated Goal status: INITIAL    LONG TERM GOALS: Target date: 08/15/2022  Patient to be independent with advanced HEP. Baseline: Not yet initiated  Goal status: INITIAL  Patient to report 0/10 dizziness with standing  vertical and horizontal VOR for 30 seconds. Baseline: c/o dizziness Goal status: INITIAL  Patient will report 0/10 dizziness with bed mobility.  Baseline: NT Goal status: INITIAL  Patient to demonstrate mild sway with M-CTSIB condition with eyes OPEN/foam surface in order to improve safety in environments with uneven surfaces and dim lighting. Baseline: NT Goal status: INITIAL  Patient to score at least 45/56 on Berg in order to decrease risk of falls. Baseline: NT Goal status: INITIAL  Patient to score at least 55 on FOTO in order to indicate improved functional outcomes.   Baseline: 45 Goal status: INITIAL    ASSESSMENT:  CLINICAL IMPRESSION:   Patient is a 71 y/o F presenting to OPPT with c/o chronic dizziness since she was 71 years old. Reports episodes of LOC resulting in collapse, with one collapse resulting in a fall down the stairs 10 years ago.  Reports that her dizziness has gotten worse with age. Dizziness fluctuates and lasts days-described as "off balance, sick on my stomach." Worse with "too much movement" and better when sitting or laying down. Reports a hx of migraines that occur daily/nightly. Denies infection/illness, vision changes/double vision, hearing loss, tinnitus, otalgia. Patient today presented with abnormal posture, B UE and L>R LE dysmetria and B mild intention tremor with coordination testing, gait deviations and decreased gait speed, decreased transfer speed. Patient's score on 5xSTS indicates an increased risk of falls. Oculomotor exam revealed direction changing gaze-evoked nystagmus, saccades with vertical and horizontal smooth pursuits, dizziness with horizontal and vertical VOR, corrective saccades and difficulty maintaining focus with VOR cancellation. Will notify PCP/Neurology on abnormal oculomotor testing. Patient was educated on gentle VOR and balance HEP and reported understanding. Would benefit from skilled PT services 2x/week for 6 weeks to address aforementioned impairments in order to optimize level of function.     OBJECTIVE IMPAIRMENTS Abnormal gait, decreased activity tolerance, decreased balance, decreased coordination, difficulty walking, dizziness, postural dysfunction, and pain.   ACTIVITY LIMITATIONS carrying, lifting, bending, sitting, standing, squatting, stairs, transfers, bed mobility, bathing, toileting, dressing, reach over head, hygiene/grooming, locomotion level, and caring for others  PARTICIPATION LIMITATIONS: meal prep, cleaning, laundry, shopping, community activity, and church  PERSONAL FACTORS Age,  Fitness, Past/current experiences, Time since onset of injury/illness/exacerbation, and 3+ comorbidities: anxiety, asthma, anemia, CKD, DMII, HLD, GERD, HTN, migraines, depression, TIA  are also affecting patient's functional outcome.   REHAB POTENTIAL: Good  CLINICAL DECISION MAKING: Evolving/moderate complexity  EVALUATION COMPLEXITY: Moderate   PLAN: PT FREQUENCY: 1-2x/week  PT DURATION: 6 weeks  PLANNED INTERVENTIONS: Therapeutic exercises, Therapeutic activity, Neuromuscular re-education, Balance training, Gait training, Patient/Family education, Self Care, Joint mobilization, Stair training, Vestibular training, Canalith repositioning, DME instructions, Aquatic Therapy, Dry Needling, Electrical stimulation, Cryotherapy, Moist heat, Taping, Manual therapy, and Re-evaluation  PLAN FOR NEXT SESSION: MCTSIB, positional testing, Berg, reassess HEP, progress VOR and habituation   Janene Harvey, PT, DPT 07/04/22 12:47 PM  [facility] [address], [place], [place] [address] Phone # [phone] Fax # [phone]

## 2022-07-04 ENCOUNTER — Other Ambulatory Visit: Payer: Self-pay

## 2022-07-04 ENCOUNTER — Encounter: Payer: Self-pay | Admitting: Physical Therapy

## 2022-07-04 ENCOUNTER — Telehealth: Payer: Self-pay | Admitting: Physical Therapy

## 2022-07-04 ENCOUNTER — Ambulatory Visit: Payer: Medicare Other | Attending: Family Medicine | Admitting: Physical Therapy

## 2022-07-04 DIAGNOSIS — H8110 Benign paroxysmal vertigo, unspecified ear: Secondary | ICD-10-CM | POA: Diagnosis not present

## 2022-07-04 DIAGNOSIS — R42 Dizziness and giddiness: Secondary | ICD-10-CM | POA: Insufficient documentation

## 2022-07-04 DIAGNOSIS — R2681 Unsteadiness on feet: Secondary | ICD-10-CM | POA: Insufficient documentation

## 2022-07-04 NOTE — Telephone Encounter (Signed)
Scheduled pt with Amy for 07/25/22 at 9:30 am.

## 2022-07-04 NOTE — Telephone Encounter (Signed)
Hi Dr. Volanda Napoleon,  I evaluated Julie Gilbert today in [place] for chronic dizziness per your referral. She presented with some abnormal oculomotor and coordination testing including: B UE and L>R LE dysmetria and B mild intention tremor, direction changing gaze-evoked nystagmus, saccades with vertical and horizontal smooth pursuits, corrective saccades and difficulty maintaining focus with VOR cancellation. She may benefit from further CNS workup or f/u with Neuro. Please advise.  Thanks!  Janene Harvey, PT, DPT

## 2022-07-06 ENCOUNTER — Encounter (HOSPITAL_COMMUNITY): Payer: Medicare Other

## 2022-07-06 NOTE — Telephone Encounter (Signed)
Just seeing this message as this provider has been out of the office for the last few wks.  Thanks for the info.  I agree, further workup definitely warranted.

## 2022-07-13 NOTE — Therapy (Addendum)
OUTPATIENT PHYSICAL THERAPY VESTIBULAR TREATMENT     Patient Name: Julie Gilbert MRN: [id number] DOB:09-06-1951, 71 y.o., female Today's Date: 07/14/2022  PCP: [name], MD REFERRING PROVIDER: [name], MD   Progress Note Reporting Period 07/04/22 to 07/14/22  See note below for Objective Data and Assessment of Progress/Goals.     PT End of Session - 07/14/22 1058     Visit Number 2    Number of Visits 13    Date for PT Re-Evaluation 08/15/22    Authorization Type UHC Medicare/Medicaid    PT Start Time 1021    PT Stop Time 1057    PT Time Calculation (min) 36 min    Activity Tolerance Patient tolerated treatment well    Behavior During Therapy WFL for tasks assessed/performed              Past Medical History:  Diagnosis Date   Anxiety 05/18/2018   Asthma    Chronic anemia 06/29/2016   Chronic pain of both knees 02/15/2019   CKD stage 3 due to type 2 diabetes mellitus ([place]) 05/18/2018   Coronary artery disease 10/01/2018   Diabetes mellitus without complication ([place])    Dyslipidemia associated with type 2 diabetes mellitus ([place]) 05/18/2018   Lab Results Component Value Date  CHOL 198 08/17/2018  HDL 62.40 08/17/2018  LDLCALC 113 (H) 08/17/2018  TRIG 113.0 08/17/2018  CHOLHDL 3 08/17/2018     Gastroesophageal reflux disease without esophagitis 06/18/2015   Herniated intervertebral disc of lumbar spine 05/01/2017   Hypertension    Hypertension associated with diabetes ([place]) 05/13/2015   IBS (irritable bowel syndrome)    Insomnia 05/13/2015   Migraine headache 05/13/2015   Moderate episode of recurrent major depressive disorder ([place]) 01/04/2017   Osteoarthritis of spine 02/03/2017   Transient ischemic attack (TIA) 06/28/2016   Type 2 diabetes mellitus with neurological complications ([place]) 3/47/4259   Lab Results Component Value Date  HGBA1C 5.9 08/17/2018  HGBA1C 5.8 01/04/2017  HGBA1C 6.4 (H) 06/29/2016  Lab Results Component Value Date  MICROALBUR 10.3  (H) 08/17/2018  LDLCALC 113 (H) 08/17/2018  CREATININE 1.56 (H) 08/17/2018     Past Surgical History:  Procedure Laterality Date   CESAREAN SECTION     EXPLORATORY LAPAROTOMY     Patient Active Problem List   Diagnosis Date Noted   Chest pain 02/17/2022   AKI (acute kidney injury) ([place]) 02/17/2022   Chronic migraine without aura without status migrainosus, not intractable 05/20/2021   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   Allergic rhinitis 02/11/2021   Vertigo 02/27/2020   B12 deficiency 02/11/2020   Iron deficiency 02/11/2020   Inflamed seborrheic keratosis 01/30/2020   Asthma 06/27/2019   Chronic pain of both knees 02/15/2019   Coronary artery disease 10/01/2018   Dyslipidemia associated with type 2 diabetes mellitus ([place]) 05/18/2018   Anxiety 05/18/2018   CKD stage 3 due to type 2 diabetes mellitus ([place]) 05/18/2018   Chest pain syndrome 10/27/2017   Herniated intervertebral disc of lumbar spine 05/01/2017   Osteoarthritis of spine 02/03/2017   Moderate episode of recurrent major depressive disorder ([place]) 01/04/2017   Chronic anemia 06/29/2016   Gastroesophageal reflux disease without esophagitis 06/18/2015   Hypertension associated with diabetes ([place]) 05/13/2015   Type 2 diabetes mellitus with neurological complications ([place]) 56/38/7564   IBS (irritable bowel syndrome) 05/13/2015   Migraine headache 05/13/2015   Insomnia 05/13/2015    ONSET DATE: started at 71 years old  REFERRING DIAG: H81.10 (ICD-10-CM) - Benign  paroxysmal positional vertigo, unspecified laterality  THERAPY DIAG:  Dizziness and giddiness  Unsteadiness on feet  Rationale for Evaluation and Treatment Rehabilitation  SUBJECTIVE:   SUBJECTIVE STATEMENT: Reports L sciatic nerve pain going on for 6 months. Still having vertigo. Has been doing the bed exercise for dizziness but not the other ones because "they don't work for me." Currently taking Topamax and Egality for migraines. C/o 0/10  dizziness currently.  Pt accompanied by: self  PERTINENT HISTORY: anxiety, asthma, anemia, CKD, DMII, HLD, GERD, HTN, migraines, depression, TIA   PAIN:  Are you having pain? Yes: NPRS scale: 9/10 Pain location: L LB and down the LE Pain description: sharp Aggravating factors: nothing Relieving factors: meds  PRECAUTIONS: Fall  WEIGHT BEARING RESTRICTIONS No  FALLS: Has patient fallen in last 6 months? Yes. Number of falls 2  PATIENT GOALS "to become a little more stable"  OBJECTIVE:     TODAY'S TREATMENT: 07/14/22 Activity Comments  Laruth Bouchard daroff  2x C/o 5/10 siting up from R, 7/10 dizziness sitting up from L; advised to slow down and sx ranged from 4-5/10   R/L forearm prop x8 No increase in dizziness  R/L forearm prop EC x8 No increase in dizziness  R/L forearm prop EC + larger amplitude/quicker motion x8 5/10 dizziness  sitting head turns to targets 30"  No increase in dizziness   sitting head nods to targets 30" Encouraged change in pace according to sx; 6/10 dizziness  sitting VOR cancellation 30"  5/10 dizziness   romberg balance 30" Dizziness/imbalance upon looking down to place feet together; spoke about holding onto counter top while placing feet together, letting dizziness settle, and then letting go to perform exercise           PATIENT EDUCATION: Education details: detailed edu on intended level of symptoms with exercises, how habituation assists in motion sensitivity; importance of HEP compliance; HEP update  Person educated: Patient Education method: Explanation, Demonstration, Tactile cues, Verbal cues, and Handouts Education comprehension: verbalized understanding and returned demonstration   [place] Last updated: 07/14/22 Access Code: [email or web address] URL: [email or web address] Date: 07/14/2022 Prepared by: [facility]  Exercises - Seated Left Head Turns Vestibular Habituation  - 1 x  daily - 5 x weekly - 2-3 sets - 30 sec hold - Seated Head Nod Vestibular Habituation  - 1 x daily - 5 x weekly - 2-3 sets - 30 sec hold - Seated VOR Cancellation  - 1 x daily - 5 x weekly - 2-3 sets - 30 sec hold - Narrow Stance with Counter Support  - 1 x daily - 5 x weekly - 2-3 sets - 30 sec hold - Sidebending to Elbow Short Sit  - 1 x daily - 5 x weekly - 2 sets - 10 reps   Below measures were taken at time of initial evaluation unless otherwise specified:    DIAGNOSTIC FINDINGS: none recent  COGNITION: Overall cognitive status: No family/caregiver present to determine baseline cognitive functioning   SENSATION: WFL  COORDINATION:  Alternating pronation/supination: B Ues slightly dysmetric Alternating toe tap: mild intention tremor B Finger to nose: considerable dysmetria on L>R LE   POSTURE: rounded shoulders and forward head; head tilted to R   GAIT: Gait pattern:  B toe out with reduced gait speed and step length Assistive device utilized: Single point cane Level of assistance: SBA   FUNCTIONAL TESTs:  5 times sit to stand: 28.08 sec pushing off B knees  and unable to fully extend knees  PATIENT SURVEYS:  FOTO [phone]   VESTIBULAR ASSESSMENT   GENERAL OBSERVATION: patient is wearing bifocals which she wears most of the time     OCULOMOTOR EXAM:   Ocular Alignment: normal   Ocular ROM: No Limitations   Spontaneous Nystagmus: absent   Gaze-Induced Nystagmus: direction changing    Smooth Pursuits:  multiple saccades in horizontal and vertical directions   Saccades:  1-2 saccades in vertical direction (normal for age)   Convergence/Divergence: <4 cm    VESTIBULAR - OCULAR REFLEX:    Slow VOR: Normal; c/o slightly increased dizziness horizontal and vertical   VOR Cancellation: Comment: B corrective saccades and difficulty maintaining focus; c/o dizziness    Head-Impulse Test: HIT Right: negative HIT Left: negative  *c/o mild dizziness     POSITIONAL  TESTING:  NT d/t time       VESTIBULAR TREATMENT:   PATIENT EDUCATION: Education details: prognosis, POC, HEP and intended level of dizziness with HEP Person educated: Patient Education method: Explanation, Demonstration, Tactile cues, Verbal cues, and Handouts Education comprehension: verbalized understanding and returned demonstration   GOALS: Goals reviewed with patient? Yes  SHORT TERM GOALS: Target date: 07/25/2022  Patient to be independent with initial HEP. Baseline: HEP initiated Goal status: IN PROGRESS    LONG TERM GOALS: Target date: 08/15/2022  Patient to be independent with advanced HEP. Baseline: Not yet initiated  Goal status: IN PROGRESS  Patient to report 0/10 dizziness with standing vertical and horizontal VOR for 30 seconds. Baseline: c/o dizziness Goal status: IN PROGRESS  Patient will report 0/10 dizziness with bed mobility.  Baseline: NT Goal status: IN PROGRESS  Patient to demonstrate mild sway with M-CTSIB condition with eyes OPEN/foam surface in order to improve safety in environments with uneven surfaces and dim lighting. Baseline: NT Goal status: IN PROGRESS  Patient to score at least 45/56 on Berg in order to decrease risk of falls. Baseline: NT Goal status: IN PROGRESS  Patient to score at least 55 on FOTO in order to indicate improved functional outcomes.  Baseline: 45 Goal status: IN PROGRESS    ASSESSMENT:  CLINICAL IMPRESSION:  Patient arrived to session with report of L LB and LE pain and unchanged symptoms. Patient admits to partial HEP noncompliance. Patient was heavily educated on clinical reasoning behind treatments and importance of HEP compliance for max benefit. Reviewed habituation activities and adjusted speed to improve tolerance for these activities. Patient tolerated all activities well today despite anticipated level of dizziness with these tasks. Patient reported understanding of all edu provided and without  complaints at end of session.    OBJECTIVE IMPAIRMENTS Abnormal gait, decreased activity tolerance, decreased balance, decreased coordination, difficulty walking, dizziness, postural dysfunction, and pain.   ACTIVITY LIMITATIONS carrying, lifting, bending, sitting, standing, squatting, stairs, transfers, bed mobility, bathing, toileting, dressing, reach over head, hygiene/grooming, locomotion level, and caring for others  PARTICIPATION LIMITATIONS: meal prep, cleaning, laundry, shopping, community activity, and church  PERSONAL FACTORS Age, Fitness, Past/current experiences, Time since onset of injury/illness/exacerbation, and 3+ comorbidities: anxiety, asthma, anemia, CKD, DMII, HLD, GERD, HTN, migraines, depression, TIA  are also affecting patient's functional outcome.   REHAB POTENTIAL: Good  CLINICAL DECISION MAKING: Evolving/moderate complexity  EVALUATION COMPLEXITY: Moderate   PLAN: PT FREQUENCY: 1-2x/week  PT DURATION: 6 weeks  PLANNED INTERVENTIONS: Therapeutic exercises, Therapeutic activity, Neuromuscular re-education, Balance training, Gait training, Patient/Family education, Self Care, Joint mobilization, Stair training, Vestibular training, Canalith repositioning, DME instructions, Aquatic Therapy, Dry  Needling, Electrical stimulation, Cryotherapy, Moist heat, Taping, Manual therapy, and Re-evaluation  PLAN FOR NEXT SESSION: MCTSIB, positional testing, Berg, reassess HEP, progress VOR and habituation   Janene Harvey, PT, DPT 07/14/22 10:59 AM  [facility] at [facility] [address], [place], [place] [address] Phone # [phone] Fax # [phone]     PHYSICAL THERAPY DISCHARGE SUMMARY  Visits from Start of Care: 2  Current functional level related to goals / functional outcomes: Unable to assess; patient did not return   Remaining deficits: Unable to assess   Education / Equipment: HEP  Plan: Patient  agrees to discharge.  Patient goals were not met. Patient is being discharged due to not returning.    Janene Harvey, PT, DPT 09/08/22 2:23 PM  [facility] at [facility] [address] [place], [place], [place] [address] Phone # [phone] Fax # [phone]

## 2022-07-14 ENCOUNTER — Encounter: Payer: Self-pay | Admitting: Physical Therapy

## 2022-07-14 ENCOUNTER — Ambulatory Visit: Payer: Medicare Other | Admitting: Physical Therapy

## 2022-07-14 ENCOUNTER — Encounter (HOSPITAL_COMMUNITY): Payer: Medicare Other

## 2022-07-14 DIAGNOSIS — R2681 Unsteadiness on feet: Secondary | ICD-10-CM

## 2022-07-14 DIAGNOSIS — H8110 Benign paroxysmal vertigo, unspecified ear: Secondary | ICD-10-CM | POA: Diagnosis not present

## 2022-07-14 DIAGNOSIS — R42 Dizziness and giddiness: Secondary | ICD-10-CM | POA: Diagnosis not present

## 2022-07-15 ENCOUNTER — Ambulatory Visit: Payer: Medicare Other | Admitting: Physical Therapy

## 2022-07-18 ENCOUNTER — Ambulatory Visit (HOSPITAL_COMMUNITY): Payer: Medicare Other

## 2022-07-18 ENCOUNTER — Encounter (HOSPITAL_COMMUNITY): Payer: Self-pay

## 2022-07-18 DIAGNOSIS — E559 Vitamin D deficiency, unspecified: Secondary | ICD-10-CM | POA: Diagnosis not present

## 2022-07-18 DIAGNOSIS — I10 Essential (primary) hypertension: Secondary | ICD-10-CM | POA: Diagnosis not present

## 2022-07-18 DIAGNOSIS — D539 Nutritional anemia, unspecified: Secondary | ICD-10-CM | POA: Diagnosis not present

## 2022-07-18 DIAGNOSIS — M5432 Sciatica, left side: Secondary | ICD-10-CM | POA: Diagnosis not present

## 2022-07-18 DIAGNOSIS — E119 Type 2 diabetes mellitus without complications: Secondary | ICD-10-CM | POA: Diagnosis not present

## 2022-07-20 NOTE — Patient Instructions (Addendum)
Below is our plan:  We will continue Emgality and topiramate for migraine management. I will order MRI of the brain and orbits to assess for any changes that could cause abnormal eye movements. Please continue PT/OT as directed. Please continue close follow up with your PCP for stroke prevention. Please schedule an updated eye exam.   Please make sure you are staying well hydrated. I recommend 50-60 ounces daily. Well balanced diet and regular exercise encouraged. Consistent sleep schedule with 6-8 hours recommended.   Please continue follow up with care team as directed.   Follow up with Dr Jaynee Eagles in 3-4 months   You may receive a survey regarding today's visit. I encourage you to leave honest feed back as I do use this information to improve patient care. Thank you for seeing me today!

## 2022-07-20 NOTE — Therapy (Incomplete)
OUTPATIENT PHYSICAL THERAPY VESTIBULAR TREATMENT     Patient Name: Julie Gilbert MRN: [id number] DOB:[birth year], 71 y.o., female Today's Date: 07/20/2022  PCP: Deeann Saint, MD REFERRING PROVIDER: Deeann Saint, MD      Past Medical History:  Diagnosis Date   Anxiety 05/18/2018   Asthma    Chronic anemia 06/29/2016   Chronic pain of both knees 02/15/2019   CKD stage 3 due to type 2 diabetes mellitus ([facility]) 05/18/2018   Coronary artery disease 10/01/2018   Diabetes mellitus without complication ([facility])    Dyslipidemia associated with type 2 diabetes mellitus ([facility]) 05/18/2018   Lab Results Component Value Date  CHOL 198 08/17/2018  HDL 62.40 08/17/2018  LDLCALC 113 (H) 08/17/2018  TRIG 113.0 08/17/2018  CHOLHDL 3 08/17/2018     Gastroesophageal reflux disease without esophagitis 06/18/2015   Herniated intervertebral disc of lumbar spine 05/01/2017   Hypertension    Hypertension associated with diabetes ([facility]) 05/13/2015   IBS (irritable bowel syndrome)    Insomnia 05/13/2015   Migraine headache 05/13/2015   Moderate episode of recurrent major depressive disorder ([facility]) 01/04/2017   Osteoarthritis of spine 02/03/2017   Transient ischemic attack (TIA) 06/28/2016   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   Lab Results Component Value Date  HGBA1C 5.9 08/17/2018  HGBA1C 5.8 01/04/2017  HGBA1C 6.4 (H) 06/29/2016  Lab Results Component Value Date  MICROALBUR 10.3 (H) 08/17/2018  LDLCALC 113 (H) 08/17/2018  CREATININE 1.56 (H) 08/17/2018     Past Surgical History:  Procedure Laterality Date   CESAREAN SECTION     EXPLORATORY LAPAROTOMY     Patient Active Problem List   Diagnosis Date Noted   Chest pain 02/17/2022   AKI (acute kidney injury) ([facility]) 02/17/2022   Chronic migraine without aura without status migrainosus, not intractable 05/20/2021   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   Allergic rhinitis 02/11/2021   Vertigo 02/27/2020   B12 deficiency  02/11/2020   Iron deficiency 02/11/2020   Inflamed seborrheic keratosis 01/30/2020   Asthma 06/27/2019   Chronic pain of both knees 02/15/2019   Coronary artery disease 10/01/2018   Dyslipidemia associated with type 2 diabetes mellitus ([facility]) 05/18/2018   Anxiety 05/18/2018   CKD stage 3 due to type 2 diabetes mellitus ([facility]) 05/18/2018   Chest pain syndrome 10/27/2017   Herniated intervertebral disc of lumbar spine 05/01/2017   Osteoarthritis of spine 02/03/2017   Moderate episode of recurrent major depressive disorder ([facility]) 01/04/2017   Chronic anemia 06/29/2016   Gastroesophageal reflux disease without esophagitis 06/18/2015   Hypertension associated with diabetes ([facility]) 05/13/2015   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   IBS (irritable bowel syndrome) 05/13/2015   Migraine headache 05/13/2015   Insomnia 05/13/2015    ONSET DATE: started at 71 years old  REFERRING DIAG: H81.10 (ICD-10-CM) - Benign paroxysmal positional vertigo, unspecified laterality  THERAPY DIAG:  No diagnosis found.  Rationale for Evaluation and Treatment Rehabilitation  SUBJECTIVE:   SUBJECTIVE STATEMENT: Reports L sciatic nerve pain going on for 6 months. Still having vertigo. Has been doing the bed exercise for dizziness but not the other ones because "they don't work for me." Currently taking Topamax and Egality for migraines. C/o 0/10 dizziness currently.  Pt accompanied by: self  PERTINENT HISTORY: anxiety, asthma, anemia, CKD, DMII, HLD, GERD, HTN, migraines, depression, TIA   PAIN:  Are you having pain? Yes: NPRS scale: 9/10 Pain location: L LB and down the LE Pain description: sharp  Aggravating factors: nothing Relieving factors: meds  PRECAUTIONS: Fall  WEIGHT BEARING RESTRICTIONS No  FALLS: Has patient fallen in last 6 months? Yes. Number of falls 2  PATIENT GOALS "to become a little more stable"  OBJECTIVE:    TODAY'S TREATMENT:  07/21/22 Activity Comments                        HOME EXERCISE PROGRAM Last updated: 07/14/22 Access Code: [email or web address] URL: [email or web address] Date: 07/14/2022 Prepared by: [facility]  Exercises - Seated Left Head Turns Vestibular Habituation  - 1 x daily - 5 x weekly - 2-3 sets - 30 sec hold - Seated Head Nod Vestibular Habituation  - 1 x daily - 5 x weekly - 2-3 sets - 30 sec hold - Seated VOR Cancellation  - 1 x daily - 5 x weekly - 2-3 sets - 30 sec hold - Narrow Stance with Counter Support  - 1 x daily - 5 x weekly - 2-3 sets - 30 sec hold - Sidebending to Elbow Short Sit  - 1 x daily - 5 x weekly - 2 sets - 10 reps   Below measures were taken at time of initial evaluation unless otherwise specified:    DIAGNOSTIC FINDINGS: none recent  COGNITION: Overall cognitive status: No family/caregiver present to determine baseline cognitive functioning   SENSATION: WFL  COORDINATION:  Alternating pronation/supination: B Ues slightly dysmetric Alternating toe tap: mild intention tremor B Finger to nose: considerable dysmetria on L>R LE   POSTURE: rounded shoulders and forward head; head tilted to R   GAIT: Gait pattern:  B toe out with reduced gait speed and step length Assistive device utilized: Single point cane Level of assistance: SBA   FUNCTIONAL TESTs:  5 times sit to stand: 28.08 sec pushing off B knees and unable to fully extend knees  PATIENT SURVEYS:  FOTO [phone]   VESTIBULAR ASSESSMENT   GENERAL OBSERVATION: patient is wearing bifocals which she wears most of the time     OCULOMOTOR EXAM:   Ocular Alignment: normal   Ocular ROM: No Limitations   Spontaneous Nystagmus: absent   Gaze-Induced Nystagmus: direction changing    Smooth Pursuits:  multiple saccades in horizontal and vertical directions   Saccades:  1-2 saccades in vertical direction (normal for age)   Convergence/Divergence: <4  cm    VESTIBULAR - OCULAR REFLEX:    Slow VOR: Normal; c/o slightly increased dizziness horizontal and vertical   VOR Cancellation: Comment: B corrective saccades and difficulty maintaining focus; c/o dizziness    Head-Impulse Test: HIT Right: negative HIT Left: negative  *c/o mild dizziness     POSITIONAL TESTING:  NT d/t time       VESTIBULAR TREATMENT:   PATIENT EDUCATION: Education details: prognosis, POC, HEP and intended level of dizziness with HEP Person educated: Patient Education method: Explanation, Demonstration, Tactile cues, Verbal cues, and Handouts Education comprehension: verbalized understanding and returned demonstration   GOALS: Goals reviewed with patient? Yes  SHORT TERM GOALS: Target date: 07/25/2022  Patient to be independent with initial HEP. Baseline: HEP initiated Goal status: IN PROGRESS    LONG TERM GOALS: Target date: 08/15/2022  Patient to be independent with advanced HEP. Baseline: Not yet initiated  Goal status: IN PROGRESS  Patient to report 0/10 dizziness with standing vertical and horizontal VOR for 30 seconds. Baseline: c/o dizziness Goal status: IN PROGRESS  Patient will report 0/10 dizziness with bed  mobility.  Baseline: NT Goal status: IN PROGRESS  Patient to demonstrate mild sway with M-CTSIB condition with eyes OPEN/foam surface in order to improve safety in environments with uneven surfaces and dim lighting. Baseline: NT Goal status: IN PROGRESS  Patient to score at least 45/56 on Berg in order to decrease risk of falls. Baseline: NT Goal status: IN PROGRESS  Patient to score at least 55 on FOTO in order to indicate improved functional outcomes.  Baseline: 45 Goal status: IN PROGRESS    ASSESSMENT:  CLINICAL IMPRESSION:  Patient arrived to session with report of L LB and LE pain and unchanged symptoms. Patient admits to partial HEP noncompliance. Patient was heavily educated on clinical reasoning behind  treatments and importance of HEP compliance for max benefit. Reviewed habituation activities and adjusted speed to improve tolerance for these activities. Patient tolerated all activities well today despite anticipated level of dizziness with these tasks. Patient reported understanding of all edu provided and without complaints at end of session.    OBJECTIVE IMPAIRMENTS Abnormal gait, decreased activity tolerance, decreased balance, decreased coordination, difficulty walking, dizziness, postural dysfunction, and pain.   ACTIVITY LIMITATIONS carrying, lifting, bending, sitting, standing, squatting, stairs, transfers, bed mobility, bathing, toileting, dressing, reach over head, hygiene/grooming, locomotion level, and caring for others  PARTICIPATION LIMITATIONS: meal prep, cleaning, laundry, shopping, community activity, and church  PERSONAL FACTORS Age, Fitness, Past/current experiences, Time since onset of injury/illness/exacerbation, and 3+ comorbidities: anxiety, asthma, anemia, CKD, DMII, HLD, GERD, HTN, migraines, depression, TIA  are also affecting patient's functional outcome.   REHAB POTENTIAL: Good  CLINICAL DECISION MAKING: Evolving/moderate complexity  EVALUATION COMPLEXITY: Moderate   PLAN: PT FREQUENCY: 1-2x/week  PT DURATION: 6 weeks  PLANNED INTERVENTIONS: Therapeutic exercises, Therapeutic activity, Neuromuscular re-education, Balance training, Gait training, Patient/Family education, Self Care, Joint mobilization, Stair training, Vestibular training, Canalith repositioning, DME instructions, Aquatic Therapy, Dry Needling, Electrical stimulation, Cryotherapy, Moist heat, Taping, Manual therapy, and Re-evaluation  PLAN FOR NEXT SESSION: MCTSIB, positional testing, Berg, reassess HEP, progress VOR and habituation   Janene Harvey, PT, DPT 07/20/22 8:33 AM  [facility] at [facility] [address] [place], [place]  [address] Phone # [phone] Fax # [phone]

## 2022-07-20 NOTE — Progress Notes (Signed)
Chief Complaint  Patient presents with   Follow-up    Pt in room #2 with her daughter. Pt here today for f/u with her migraines.    HISTORY OF PRESENT ILLNESS:  07/25/22 ALL:  Julie Gilbert returns for an acute visit. She has suffered from vertigo for years. She was referred to PT by Julie Julie Gilbert. 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   She continues to work with PT. She reports that dizziness remains constant. Her daughter feels that she is doing better. She missed visits last week due to back pain. She is followed by Julie Julie Gilbert with [facility]. She is treated with cyclobenzaprine and pregabalin. She uses meclizine TID. She was weaned off clonazepam (anxiety) and started on Ambien $RemoveB'10mg'AYDaZpVQ$  for insomnia. She denies vision loss. No double vision. She does have blurred vision with headaches only. She occasionally has left eye pain with migraines. She has not had an eye exam recently.   She continues Emgality monthly and topiramate $RemoveBefore'50mg'uMUgOzlgrqZfb$  in am and $Remo'100mg'jutTq$  in pm for migraine prevention. She reports having about 4 headache days per month. Rest will abort headache. She feels that weather changes are most common trigger.   She is followed regularly by Julie Julie Gilbert. Last A1C 6.1. Last LDL 111. She continues asa $RemoveBefor'81mg'qludiEiyfDnu$  and atorvastatin $RemoveBeforeDE'40mg'XEVimXUHxlTHFeu$ . She reported side effects with Repatha. She reports being told to take iron supplements last week. She reports low Hgb. Last hgb 11. She reports blood work was updated with Julie Julie Gilbert last week. She is being set up for a  colonoscopy. She reports not sleeping well despite taking Ambien $RemoveBefor'10mg'kALEMqNoKlkq$  daily. She reports sleep study was normal. She does not snore. She continues to have a tremor. Worse on left hand but can be both. Tremor is worse with activity. If sitting still, no tremor. She reports drinking at least 80oz of water daily.   04/19/2022 ALL: Julie Gilbert is a 71 y.o. female here today for follow up for migraines. She continues emgality and topiramate $RemoveBefore'50mg'AZVrWFkJJktbZ$  in am and $Remo'100mg'DMgyj$  at bedtime. She reports that headaches wax and wane. She has severe allergies and reports this contributed to some months of worsening. She has had really bad allergies this year. She can't breath well in the hot weather. She reports that headaches are much better on Emgality and topiramate. Most months she has 1-2 headaches days easily aborted with Tylenol and rest. She has lost 30lbs with diet changes. She does report that injection seems to sting a little more now. She is not interested in [place] due to fears of worsening constipation. She tries to drink 4 bottles of water daily.   HISTORY (copied from Julie Gilbert previous note)  10/19/2021: Emgality has been working great. Roselyn Meier made her sick. She has been doing excellent on the [employer]. She was a little dizzy on standing, her BP slightly elevated, we had to get her a wheelchair. Today she feels bad and has been feeling bad since the repatha. Roselyn Meier does not work when she has a migraine. She stayed on her Topamax and she was doing well. Patient not feeling well  after taking repatha, looks ill today, says she has been feeling unwell since taking it, her BP is slightly elevated, she appears to be tremulous, I called [place] and they have an 8 hour wait. [facility] and no wait time but I was able to call her pcp Julie. Wynetta Gilbert and got her an appointment at 330. Daughter is here as well and provides information, she has not been feeling well since Repatha. She has been doing fantastic on Emgality,  barely 2 total headaches a month   HPI 05/20/2021:  Julie Gilbert is a 71 y.o. female here as requested by Julie Barrack, MD for migraines. PMHx migraine, anxiety, asthma, chronic kidney disease, coronary artery disease, diabetes, dyslipidemia, hypertension, depression, osteoarthritis, lacunar stroke, B12 deficiency, vertigo. Here with dauhter who also provides information. She is doing a lot better with her migraines. She has a cardiologist. She is on a heart monitor. Her migraines cn be severe in the left side back and side, pulsating/pounding/throbbing/, pulsating/pounding/throbbing, photophobia/phonophobia, most severe pain is the back in the left, the Emgality helps a lot, she is also on Topamax. Extremely severe. Nausea, she would pass out it was so severe. Used to have daily headaches, weather can be a trigger, a lot less maybe about 6 a month, was not able to get the [place].  Mother had tremors. With action and posture. Order Roselyn Meier for acute management. Preventative Topamax and emgality. Discussed botox.No other focal neurologic deficits, associated symptoms, inciting events or modifiable factors.   Reviewed notes, labs and imaging from outside physicians, which showed:   From a thorough review of records, medications tried that can be used in migraine management include: Amlodipine (calcium channel blocker similar to verapamil), aspirin, Fioricet, Voltaren tablet, Benadryl, Emgality, Lexapro, gabapentin, meclizine, melatonin, Reglan injections, naproxen, Zofran, prednisone tablets, Phenergan tablets, Imitrex, Topamax, trazodone, propranolol contraindicated due to asthma, amitriptyline/nortrio contraindicated due to being on lexapro, TRIPTANS CONTRAINDICATED due to strokes   REVIEW OF SYSTEMS: Out of a complete 14 system review of symptoms, the patient complains only of the following symptoms, headaches, seasonal allergies, constipation and all other reviewed systems are  negative.   ALLERGIES: Allergies  Allergen Reactions   Peanuts [Peanut Oil] Anaphylaxis   Shrimp [Shellfish Allergy] Anaphylaxis   Latex Hives   Elemental Sulfur Itching and Swelling   Iodinated Contrast Media Hives    Pt reported she had hives after recent procedure w/ CT contrast.    Other Other (See Comments)    Adhesive on EKG pads irritated skin   Penicillins Hives    Has patient had a PCN reaction causing immediate rash, facial/tongue/throat swelling, SOB or lightheadedness with hypotension: No Has patient had a PCN reaction causing severe rash involving mucus membranes or skin necrosis: No Has patient had a PCN reaction that required hospitalization: No Has patient had a PCN reaction occurring within the last 10 years: No  If all of the above answers are "NO", then may proceed with Cephalosporin use.     Pineapple Swelling and Other (See Comments)    Scratchy throat, Tongue swelling   Repatha [Evolocumab] Other (See Comments)    Dizzy, scratchy eyes/throat, palpitations   Sulfa Antibiotics Hives     HOME MEDICATIONS: Outpatient Medications Prior to Visit  Medication Sig Dispense Refill   albuterol (VENTOLIN HFA) 108 (90 Base) MCG/ACT inhaler TAKE 2 PUFFS BY MOUTH EVERY 6 HOURS AS NEEDED FOR WHEEZE OR SHORTNESS OF BREATH 8.5 each 1   Ascorbic Acid (VITAMIN C) 1000 MG tablet  Take 1,000 mg by mouth at bedtime.     aspirin EC 81 MG tablet Take 81 mg by mouth every morning. Swallow whole.     atorvastatin (LIPITOR) 40 MG tablet Take 1 tablet (40 mg total) by mouth daily. (Patient not taking: Reported on 07/04/2022) 90 tablet 3   Blood Glucose Monitoring Suppl (ACCU-CHEK AVIVA PLUS) w/Device KIT Use as directed to check blood sugar once daily. E11.9 1 kit 0   Blood Pressure Monitoring (BLOOD PRESSURE KIT) DEVI 1 application by Does not apply route daily. Use to check blood pressure daily 1 Device 0   cholecalciferol (VITAMIN D3) 25 MCG (1000 UNIT) tablet Take 1,000 Units by  mouth at bedtime.     clonazePAM (KLONOPIN) 1 MG tablet Take 1 tablet (1 mg total) by mouth 3 (three) times daily as needed for anxiety. (Patient not taking: Reported on 07/04/2022) 20 tablet 0   COD LIVER OIL PO Take 1-2 capsules by mouth See admin instructions. Take one capsule by mouth every morning and take two capsules at night     EMGALITY 120 MG/ML SOAJ Inject 120 ml monthly. 1 mL 11   escitalopram (LEXAPRO) 10 MG tablet Take 1 tablet (10 mg total) by mouth daily. (Patient taking differently: Take 10 mg by mouth every morning.) 90 tablet 3   fluticasone (FLONASE) 50 MCG/ACT nasal spray PLACE 1 SPRAY INTO BOTH NOSTRILS DAILY AS NEEDED FOR ALLERGIES OR RHINITIS. 48 mL 3   GARLIC PO Take 1 tablet by mouth at bedtime.     glucose blood (ACCU-CHEK AVIVA PLUS) test strip Use as instructed to check blood sugar once daily. E11.9 100 each 12   Lancets (ACCU-CHEK SOFT TOUCH) lancets Use as instructed to check blood sugar once daily E11.9 100 each 12   losartan (COZAAR) 25 MG tablet Take 1 tablet (25 mg total) by mouth daily. 90 tablet 3   meclizine (ANTIVERT) 25 MG tablet Take 1 tablet (25 mg total) by mouth 3 (three) times daily. 180 tablet 3   Misc Natural Products (CRANBERRY/PROBIOTIC PO) Take 2 capsules by mouth every morning.     Multiple Vitamin (MULTIVITAMIN WITH MINERALS) TABS tablet Take 1 tablet by mouth at bedtime.     Omega-3 Fatty Acids (FISH OIL PO) Take 1 capsule by mouth at bedtime.     OVER THE COUNTER MEDICATION Take 1 capsule by mouth at bedtime. Mother's nature for menopause     pantoprazole (PROTONIX) 40 MG tablet Take 40 mg by mouth every morning.     SYMBICORT 160-4.5 MCG/ACT inhaler TAKE 2 PUFFS BY MOUTH TWICE A DAY (Patient taking differently: 2 puffs 2 (two) times daily as needed (shortness of breath/wheezing).) 30.6 each 3   topiramate (TOPAMAX) 50 MG tablet Take 1 tablet (50 mg total) by mouth in the morning AND 2 tablets (100 mg total) at bedtime. 270 tablet 3   zolpidem  (AMBIEN) 10 MG tablet Take 10 mg by mouth at bedtime.     No facility-administered medications prior to visit.     PAST MEDICAL HISTORY: Past Medical History:  Diagnosis Date   Anxiety 05/18/2018   Asthma    Chronic anemia 06/29/2016   Chronic pain of both knees 02/15/2019   CKD stage 3 due to type 2 diabetes mellitus ([place]) 05/18/2018   Coronary artery disease 10/01/2018   Diabetes mellitus without complication ([place])    Dyslipidemia associated with type 2 diabetes mellitus ([place]) 05/18/2018   Lab Results Component Value Date  CHOL 198 08/17/2018  HDL 62.40 08/17/2018  LDLCALC 113 (H) 08/17/2018  TRIG 113.0 08/17/2018  CHOLHDL 3 08/17/2018     Gastroesophageal reflux disease without esophagitis 06/18/2015   Herniated intervertebral disc of lumbar spine 05/01/2017   Hypertension    Hypertension associated with diabetes ([place]) 05/13/2015   IBS (irritable bowel syndrome)    Insomnia 05/13/2015   Migraine headache 05/13/2015   Moderate episode of recurrent major depressive disorder ([place]) 01/04/2017   Osteoarthritis of spine 02/03/2017   Transient ischemic attack (TIA) 06/28/2016   Type 2 diabetes mellitus with neurological complications ([place]) 1/84/0375   Lab Results Component Value Date  HGBA1C 5.9 08/17/2018  HGBA1C 5.8 01/04/2017  HGBA1C 6.4 (H) 06/29/2016  Lab Results Component Value Date  MICROALBUR 10.3 (H) 08/17/2018  LDLCALC 113 (H) 08/17/2018  CREATININE 1.56 (H) 08/17/2018       PAST SURGICAL HISTORY: Past Surgical History:  Procedure Laterality Date   CESAREAN SECTION     EXPLORATORY LAPAROTOMY       FAMILY HISTORY: Family History  Problem Relation Age of Onset   Kidney failure Mother    Hypertension Mother    Heart disease Father    Cancer Maternal Grandfather    Migraines Other    Breast cancer Neg Hx      SOCIAL HISTORY: Social History   Socioeconomic History   Marital status: Divorced    Spouse name: Not on file   Number of children: Not on file   Years of  education: Not on file   Highest education level: Not on file  Occupational History   Not on file  Tobacco Use   Smoking status: Former    Types: Cigarettes    Quit date: 09/22/2012    Years since quitting: 9.8   Smokeless tobacco: Never  Vaping Use   Vaping Use: Never used  Substance and Sexual Activity   Alcohol use: Yes    Alcohol/week: 1.0 standard drink of alcohol    Types: 1 Glasses of wine per week    Comment: red wine 1-2 x month   Drug use: No   Sexual activity: Not Currently  Other Topics Concern   Not on file  Social History Narrative   Lives with daughter Skyelar Halliday)    Right handed   Caffeine: none    Social Determinants of Health   Financial Resource Strain: Low Risk  (05/12/2022)   Overall Financial Resource Strain (CARDIA)    Difficulty of Paying Living Expenses: Not hard at all  Food Insecurity: No Food Insecurity (05/12/2022)   Hunger Vital Sign    Worried About Running Out of Food in the Last Year: Never true    Ran Out of Food in the Last Year: Never true  Transportation Needs: No Transportation Needs (05/12/2022)   PRAPARE - Hydrologist (Medical): No    Lack of Transportation (Non-Medical): No  Physical Activity: Insufficiently Active (05/12/2022)   Exercise Vital Sign    Days of Exercise per Week: 3 days    Minutes of Exercise per Session: 40 min  Stress: No Stress Concern Present (05/12/2022)   [place]    Feeling of Stress : Only a little  Social Connections: Moderately Integrated (05/12/2022)   Social Connection and Isolation Panel [NHANES]    Frequency of Communication with Friends and Family: More than three times a week    Frequency of Social Gatherings with Friends and Family: More than three times a  week    Attends Religious Services: More than 4 times per year    Active Member of Clubs or Organizations: Yes    Attends Archivist  Meetings: More than 4 times per year    Marital Status: Divorced  Intimate Partner Violence: Not At Risk (05/12/2022)   Humiliation, Afraid, Rape, and Kick questionnaire    Fear of Current or Ex-Partner: No    Emotionally Abused: No    Physically Abused: No    Sexually Abused: No     PHYSICAL EXAM  Vitals:   07/25/22 0919  BP: (!) 139/91  Pulse: 66  Weight: 163 lb (73.9 kg)  Height: $Remove'5\' 6"'ltHaKhX$  (1.676 m)    Body mass index is 26.31 kg/m.  Generalized: Well developed, in no acute distress  Cardiology: normal rate and rhythm, no murmur auscultated  Respiratory: clear to auscultation bilaterally    Neurological examination  Mentation: Alert oriented to time, place, history taking. Follows all commands speech and language fluent Cranial nerve II-XII: Pupils were equal round reactive to light. Extraocular movements were full, visual field were full on confrontational test. Very mild horizontal saccadic movements. No nystagmus noted. Facial sensation and strength were normal. Head turning and shoulder shrug  were normal and symmetric. Motor: The motor testing reveals 5 over 5 strength of all 4 extremities. Good symmetric motor tone is noted throughout. No evidence of tremor on exam.  Sensory: Sensory testing is intact to soft touch on all 4 extremities. No evidence of extinction is noted.  Coordination: Cerebellar testing reveals good finger-nose-finger and heel-to-shin bilaterally.  Gait and station: Gait is normal.  Reflexes: Deep tendon reflexes are symmetric and normal bilaterally.    DIAGNOSTIC DATA (LABS, IMAGING, TESTING) - I reviewed patient records, labs, notes, testing and imaging myself where available.  Lab Results  Component Value Date   WBC 5.0 02/16/2022   HGB 11.0 (L) 02/16/2022   HCT 34.6 (L) 02/16/2022   MCV 89.4 02/16/2022   PLT 164 02/16/2022      Component Value Date/Time   NA 140 02/17/2022 1004   NA 140 10/04/2021 1101   K 4.4 02/17/2022 1004   CL 110  02/17/2022 1004   CO2 17 (L) 02/17/2022 1004   GLUCOSE 78 02/17/2022 1004   BUN 18 02/17/2022 1004   BUN 23 10/04/2021 1101   CREATININE 2.22 (H) 02/17/2022 1004   CREATININE 1.71 (H) 07/05/2018 1231   CREATININE 1.10 (H) 01/09/2017 1130   CALCIUM 9.7 02/17/2022 1004   PROT 7.7 02/16/2022 2108   PROT 6.8 09/21/2021 0803   ALBUMIN 4.6 02/16/2022 2108   ALBUMIN 4.4 09/21/2021 0803   AST 21 02/16/2022 2108   AST 20 07/05/2018 1231   ALT 15 02/16/2022 2108   ALT 12 07/05/2018 1231   ALKPHOS 48 02/16/2022 2108   BILITOT 0.7 02/16/2022 2108   BILITOT 0.3 09/21/2021 0803   BILITOT 0.7 07/05/2018 1231   GFRNONAA 24 (L) 02/17/2022 1004   GFRNONAA 30 (L) 07/05/2018 1231   GFRNONAA 54 (L) 01/09/2017 1130   GFRAA 39 (L) 12/06/2019 1045   GFRAA 35 (L) 07/05/2018 1231   GFRAA 62 01/09/2017 1130   Lab Results  Component Value Date   CHOL 196 09/21/2021   HDL 69 09/21/2021   LDLCALC 111 (H) 09/21/2021   TRIG 92 09/21/2021   CHOLHDL 2.8 09/21/2021   Lab Results  Component Value Date   HGBA1C 6.1 (H) 02/17/2022   Lab Results  Component Value Date   VITAMINB12 >  1550 (H) 02/11/2021   Lab Results  Component Value Date   TSH 2.48 02/11/2021        No data to display               No data to display           ASSESSMENT AND PLAN  71 y.o. year old female  has a past medical history of Anxiety (05/18/2018), Asthma, Chronic anemia (06/29/2016), Chronic pain of both knees (02/15/2019), CKD stage 3 due to type 2 diabetes mellitus ([place]) (05/18/2018), Coronary artery disease (10/01/2018), Diabetes mellitus without complication ([place]), Dyslipidemia associated with type 2 diabetes mellitus ([place]) (05/18/2018), Gastroesophageal reflux disease without esophagitis (06/18/2015), Herniated intervertebral disc of lumbar spine (05/01/2017), Hypertension, Hypertension associated with diabetes ([place]) (05/13/2015), IBS (irritable bowel syndrome), Insomnia (05/13/2015), Migraine headache (05/13/2015),  Moderate episode of recurrent major depressive disorder ([place]) (01/04/2017), Osteoarthritis of spine (02/03/2017), Transient ischemic attack (TIA) (06/28/2016), and Type 2 diabetes mellitus with neurological complications ([place]) (10/01/2692). here with    Lacunar infarction ([place]) - Plan: MR BRAIN WO CONTRAST, MR ORBITS WO CONTRAST  Chronic vertigo - Plan: MR BRAIN WO CONTRAST, MR ORBITS WO CONTRAST  Ophthalmoplegia - Plan: MR BRAIN WO CONTRAST, MR ORBITS WO CONTRAST  Chronic migraine without aura without status migrainosus, not intractable  Hereditary essential tremor  Julie Gilbert reports chronic dizziness remains problematic. She continues to work with PT/OT but has not felt this was beneficial. We have discussed multiple likely contributing factors with old CVA, white matter changes, medication side effects, migraines, etc. MRI in 2019 was stable, however, abnormal eye movements are fairly new. I will update imaging. MR brain and orbits without contrast due to CKD. Last creatine on file 2.22. She was advised to continue PT/OT and update eye exam asap. Tremor seems stable. I do not feel additional medication management would be helpful at this time. No parkinsonian concerns with exam or history. Migraines seem well managed. We will continue Emgality and topiramate $RemoveBefore'50mg'RvvfeZabQYreW$  in am and $Remo'100mg'VLTfO$  in pm. Continue Tylenol for abortive care. She was encouraged to talk with PCP about allergy treatment options. Healthy lifestyle habits encouraged. She will follow up with PCP as directed for stroke prevention, disease management and evaluation of anemia. She will return to see Julie Julie Gilbert per her request and to review notes and imaging. I anticipate she will follow up with me, thereafter. She verbalizes understanding and agreement with this plan.   Orders Placed This Encounter  Procedures   MR BRAIN WO CONTRAST    Standing Status:   Future    Standing Expiration Date:   07/26/2023    Order Specific Question:   What is  the patient's sedation requirement?    Answer:   No Sedation    Order Specific Question:   Does the patient have a pacemaker or implanted devices?    Answer:   No    Order Specific Question:   Preferred imaging location?    Answer:   External   MR ORBITS WO CONTRAST    Standing Status:   Future    Standing Expiration Date:   07/26/2023    Order Specific Question:   What is the patient's sedation requirement?    Answer:   No Sedation    Order Specific Question:   Does the patient have a pacemaker or implanted devices?    Answer:   No    Order Specific Question:   Preferred imaging location?    Answer:  External     No orders of the defined types were placed in this encounter.    Debbora Presto, MSN, FNP-C 07/25/2022, 10:16 AM  [facility] [address] [place], [place] [address] [phone]

## 2022-07-21 ENCOUNTER — Ambulatory Visit: Payer: Medicare Other | Admitting: Physical Therapy

## 2022-07-22 ENCOUNTER — Ambulatory Visit: Payer: Medicare Other

## 2022-07-25 ENCOUNTER — Ambulatory Visit (INDEPENDENT_AMBULATORY_CARE_PROVIDER_SITE_OTHER): Payer: Medicare Other | Admitting: Family Medicine

## 2022-07-25 ENCOUNTER — Other Ambulatory Visit: Payer: Self-pay | Admitting: Cardiovascular Disease

## 2022-07-25 ENCOUNTER — Telehealth: Payer: Self-pay | Admitting: Family Medicine

## 2022-07-25 ENCOUNTER — Encounter: Payer: Self-pay | Admitting: Family Medicine

## 2022-07-25 VITALS — BP 139/91 | HR 66 | Ht 66.0 in | Wt 163.0 lb

## 2022-07-25 DIAGNOSIS — R42 Dizziness and giddiness: Secondary | ICD-10-CM

## 2022-07-25 DIAGNOSIS — G250 Essential tremor: Secondary | ICD-10-CM

## 2022-07-25 DIAGNOSIS — I6381 Other cerebral infarction due to occlusion or stenosis of small artery: Secondary | ICD-10-CM

## 2022-07-25 DIAGNOSIS — H499 Unspecified paralytic strabismus: Secondary | ICD-10-CM | POA: Diagnosis not present

## 2022-07-25 DIAGNOSIS — G43709 Chronic migraine without aura, not intractable, without status migrainosus: Secondary | ICD-10-CM

## 2022-07-25 NOTE — Telephone Encounter (Signed)
medicare/medicaid NPR sent to GI [phone] 

## 2022-07-26 ENCOUNTER — Other Ambulatory Visit: Payer: Self-pay | Admitting: Interventional Cardiology

## 2022-07-26 ENCOUNTER — Other Ambulatory Visit: Payer: Self-pay | Admitting: Family Medicine

## 2022-07-27 ENCOUNTER — Other Ambulatory Visit: Payer: Self-pay

## 2022-07-27 DIAGNOSIS — G43709 Chronic migraine without aura, not intractable, without status migrainosus: Secondary | ICD-10-CM

## 2022-07-27 DIAGNOSIS — G43909 Migraine, unspecified, not intractable, without status migrainosus: Secondary | ICD-10-CM

## 2022-07-27 NOTE — Telephone Encounter (Signed)
This is Dr. [place]'s pt.  °

## 2022-07-28 ENCOUNTER — Ambulatory Visit: Payer: Medicare Other | Admitting: Physical Therapy

## 2022-07-29 ENCOUNTER — Ambulatory Visit: Payer: Medicare Other | Admitting: Physical Therapy

## 2022-08-04 ENCOUNTER — Ambulatory Visit (HOSPITAL_COMMUNITY)
Admission: RE | Admit: 2022-08-04 | Discharge: 2022-08-04 | Disposition: A | Discharge status: Home / Self Care | Payer: Medicare Other | Source: Ambulatory Visit | Attending: Vascular Surgery | Admitting: Vascular Surgery

## 2022-08-04 ENCOUNTER — Encounter: Payer: Medicare Other | Admitting: Physical Therapy

## 2022-08-04 DIAGNOSIS — E1159 Type 2 diabetes mellitus with other circulatory complications: Secondary | ICD-10-CM | POA: Insufficient documentation

## 2022-08-04 DIAGNOSIS — M79674 Pain in right toe(s): Secondary | ICD-10-CM | POA: Insufficient documentation

## 2022-08-04 DIAGNOSIS — I152 Hypertension secondary to endocrine disorders: Secondary | ICD-10-CM | POA: Diagnosis not present

## 2022-08-04 DIAGNOSIS — R209 Unspecified disturbances of skin sensation: Secondary | ICD-10-CM | POA: Diagnosis not present

## 2022-08-04 DIAGNOSIS — M79675 Pain in left toe(s): Secondary | ICD-10-CM | POA: Diagnosis not present

## 2022-08-04 DIAGNOSIS — E1149 Type 2 diabetes mellitus with other diabetic neurological complication: Secondary | ICD-10-CM | POA: Insufficient documentation

## 2022-08-05 ENCOUNTER — Encounter: Payer: Medicare Other | Admitting: Physical Therapy

## 2022-08-22 ENCOUNTER — Telehealth: Payer: Self-pay | Admitting: Podiatry

## 2022-08-22 NOTE — Telephone Encounter (Signed)
Pt states that she is returning a call about test results.  Please advise

## 2022-08-23 ENCOUNTER — Telehealth (INDEPENDENT_AMBULATORY_CARE_PROVIDER_SITE_OTHER): Payer: Medicare Other | Admitting: Podiatry

## 2022-08-23 ENCOUNTER — Telehealth: Payer: Self-pay | Admitting: Podiatry

## 2022-08-23 NOTE — Telephone Encounter (Signed)
Returned patient's phone call this morning to discuss ABI results. No answer. I was able to leave a VM stating either I or a staff member would call to discuss results after clinic this afternoon.

## 2022-08-23 NOTE — Telephone Encounter (Signed)
Pt returned your call and wanted to let you know she did get the message and has gotten the results you were calling for from my chart. She stated she wanted to let you know that she did understand the results and you did not need to call her back. She understands that it had to do with her neuropathy.

## 2022-08-24 ENCOUNTER — Other Ambulatory Visit: Payer: Self-pay | Admitting: Family Medicine

## 2022-09-14 NOTE — Telephone Encounter (Signed)
I didn't call this patient today.

## 2022-09-21 ENCOUNTER — Other Ambulatory Visit: Payer: Self-pay | Admitting: Family Medicine

## 2022-09-28 ENCOUNTER — Encounter (HOSPITAL_BASED_OUTPATIENT_CLINIC_OR_DEPARTMENT_OTHER): Payer: Self-pay | Admitting: *Deleted

## 2022-10-12 ENCOUNTER — Telehealth: Payer: Self-pay | Admitting: Neurology

## 2022-10-12 NOTE — Telephone Encounter (Signed)
Appointment change.

## 2022-10-19 ENCOUNTER — Ambulatory Visit: Payer: 59 | Admitting: Neurology

## 2022-10-25 ENCOUNTER — Encounter: Payer: Self-pay | Admitting: Neurology

## 2022-10-25 ENCOUNTER — Ambulatory Visit (INDEPENDENT_AMBULATORY_CARE_PROVIDER_SITE_OTHER): Payer: 59 | Admitting: Neurology

## 2022-10-25 VITALS — BP 157/99 | HR 62 | Ht 68.0 in | Wt 162.8 lb

## 2022-10-25 DIAGNOSIS — G43909 Migraine, unspecified, not intractable, without status migrainosus: Secondary | ICD-10-CM | POA: Diagnosis not present

## 2022-10-25 DIAGNOSIS — G43101 Migraine with aura, not intractable, with status migrainosus: Secondary | ICD-10-CM

## 2022-10-25 DIAGNOSIS — G43901 Migraine, unspecified, not intractable, with status migrainosus: Secondary | ICD-10-CM

## 2022-10-25 DIAGNOSIS — G43709 Chronic migraine without aura, not intractable, without status migrainosus: Secondary | ICD-10-CM | POA: Diagnosis not present

## 2022-10-25 MED ORDER — NURTEC 75 MG PO TBDP: 75.0000 mg | ORAL_TABLET | Freq: Every day | ORAL | 0 refills | Status: DC | PRN

## 2022-10-25 MED ORDER — NURTEC 75 MG PO TBDP: 75.0000 mg | ORAL_TABLET | Freq: Every day | ORAL | 11 refills | Status: DC | PRN

## 2022-10-25 MED ORDER — EMGALITY 120 MG/ML ~~LOC~~ SOAJ: SUBCUTANEOUS | 11 refills | Status: DC

## 2022-10-25 NOTE — Patient Instructions (Addendum)
Continue emgality and topamax for prevention Acute/emergency: try nurtec. Take at onset of migraines. If having breakthrough the last week prior to emgality take one every day until your shot. Take shot as soon as available.    Rimegepant Disintegrating Tablets What is this medication? RIMEGEPANT (ri ME je pant) prevents and treats migraines. It works by blocking a substance in the body that causes migraines. This medicine may be used for other purposes; ask your health care provider or pharmacist if you have questions. COMMON BRAND NAME(S): NURTEC ODT What should I tell my care team before I take this medication? They need to know if you have any of these conditions: Kidney disease Liver disease An unusual or allergic reaction to rimegepant, other medications, foods, dyes, or preservatives Pregnant or trying to get pregnant Breast-feeding How should I use this medication? Take this medication by mouth. Take it as directed on the prescription label. Leave the tablet in the sealed pack until you are ready to take it. With dry hands, open the pack and gently remove the tablet. If the tablet breaks or crumbles, throw it away. Use a new tablet. Place the tablet in the mouth and allow it to dissolve. Then, swallow it. Do not cut, crush, or chew this medication. You do not need water to take this medication. Talk to your care team about the use of this medication in children. Special care may be needed. Overdosage: If you think you have taken too much of this medicine contact a poison control center or emergency room at once. NOTE: This medicine is only for you. Do not share this medicine with others. What if I miss a dose? This does not apply. This medication is not for regular use. What may interact with this medication? Certain medications for fungal infections, such as fluconazole, itraconazole Rifampin This list may not describe all possible interactions. Give your health care provider a  list of all the medicines, herbs, non-prescription drugs, or dietary supplements you use. Also tell them if you smoke, drink alcohol, or use illegal drugs. Some items may interact with your medicine. What should I watch for while using this medication? Visit your care team for regular checks on your progress. Tell your care team if your symptoms do not start to get better or if they get worse. What side effects may I notice from receiving this medication? Side effects that you should report to your care team as soon as possible: Allergic reactions--skin rash, itching, hives, swelling of the face, lips, tongue, or throat Side effects that usually do not require medical attention (report to your care team if they continue or are bothersome): Nausea Stomach pain This list may not describe all possible side effects. Call your doctor for medical advice about side effects. You may report side effects to FDA at 1-800-FDA-1088. Where should I keep my medication? Keep out of the reach of children and pets. Store at room temperature between 20 and 25 degrees C (68 and 77 degrees F). Get rid of any unused medication after the expiration date. To get rid of medications that are no longer needed or have expired: Take the medication to a medication take-back program. Check with your pharmacy or law enforcement to find a location. If you cannot return the medication, check the label or package insert to see if the medication should be thrown out in the garbage or flushed down the toilet. If you are not sure, ask your care team. If it is safe to put  it in the trash, take the medication out of the container. Mix the medication with cat litter, dirt, coffee grounds, or other unwanted substance. Seal the mixture in a bag or container. Put it in the trash. NOTE: This sheet is a summary. It may not cover all possible information. If you have questions about this medicine, talk to your doctor, pharmacist, or health care  provider.  2023 [employer] Standard (2021-09-30 00:00:00)

## 2022-10-25 NOTE — Progress Notes (Signed)
Chief Complaint  Patient presents with   Follow-up    Rm 4, Daughter.  Pt having bad migraine for about 2 wks.  Left and whole top of head. Level 10, taking her preventative. Feels bad.   Elevated bp, takes losartan.     HISTORY OF PRESENT ILLNESS: 10/25/2022: Dr. Lucia Gaskins.  This is a 72 year old patient we have seen in the past for multiple neurologic symptoms including migraines and lacunar infarcts.  Repeat MRI of the brain and orbits was ordered in October by my NP but does not look like they were completed.  We initially saw her for migraines past medical history migraine, anxiety, asthma, chronic kidney disease, lacunar infarcts, coronary artery disease, diabetes, dyslipidemia, hypertension, depression, osteoarthritis, B12 deficiency and vertigo.  Patient has several specialists following her including cardiology and when we first met her she was on a heart monitor.  We placed her on Emgality and Topamax which seemed to help prior to that she had daily headaches and treatment decreased it to 6 headache days a month.  She has a family history of tremors with action and posture and we kept her on Topamax and Emgality and she declined Botox.  [name] made her sick.  But we have kept her on Emgality which has worked.  She was started on Repatha in the past, [name] did not work, we kept her on [employer] and Topamax.  Barely 2  total headaches a month on Emgaliy aborted by Tylenol.  She is also lost weight with diet changes.  She was not interested in [place] or Botox.  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  At that time she continued to work with PT for dizziness and vertigo and she was doing better.  Followed by [name] at [facility].  Using Flexeril and pregabalin  and meclizine 3 times daily.  She was weaned off clonazepam and and has insomnia as well.  We recommended an eye exam.  At that time she reported about 4 migraine days in headache days per month doing great on Emgality.  She is followed by [name] last A1c 6.1 last LDL 111 and she continued aspirin 81 mg and atorvastatin and Repatha.  Sleep study was normal.  Tremors continue not at rest.  She continues to try to hydrate.  She has had a 2 week migraines but usually doing excellent on Emgality and only has 4 migraines a month will try nurtec acutely. She did miss her emgality. The weather and stress has affected her. Last emgality was Dec 28th so due for emgality now and her prescription. Put the prescription on autofill and as soon as its ready take it. [name] did not work. We discussed continuing emgality and topamax but she needs a good acute management medication, triptans contraindicated due to strokes will try and get her nurtec. Take her to iv infusion today for status migrainosus.  Patient complains of symptoms per HPI as well as the following symptoms: status migrainosus . Pertinent negatives and positives per HPI. All others negative   07/25/2022 ALL:  Julie Gilbert returns for an acute visit. She has suffered from vertigo for years. She was referred to PT by Dr Lucia Gaskins. We received a message regarding abnormal occulomotor and coordination testing 07/04/2022  Per report, she had "B UE and L>R LE dysmetria and B mild intention tremor, direction changing gaze-evoked nystagmus, saccades with vertical and horizontal smooth pursuits, corrective saccades and difficulty maintaining focus with VOR cancellation." MRI in 11/2017 showed chronic lacunar infarcts in right caudate and left cerebellum, non specific white matter changes, mild to moderate for age, all stable from 2017 imaging.   She continues to work with PT. She reports that dizziness remains constant. Her daughter feels that she is doing better. She missed  visits last week due to back pain. She is followed by Dr Wynelle Link with [facility] ([place]). She is treated with cyclobenzaprine and pregabalin. She uses meclizine TID. She was weaned off clonazepam (anxiety) and started on Ambien 10mg  for insomnia. She denies vision loss. No double vision. She does have blurred vision with headaches only. She occasionally has left eye pain with migraines. She has not had an eye exam recently.   She continues Emgality monthly and topiramate 50mg  in am and 100mg  in pm for migraine prevention. She reports having about 4 headache days per month. Rest will abort headache. She feels that weather changes are most common trigger.   She is followed regularly by Dr . Last A1C 6.1. Last LDL 111. She continues asa 81mg  and atorvastatin 40mg . She reported side effects with Repatha. She reports being told to take iron supplements last week. She reports low Hgb. Last hgb 11. She reports blood work was updated with Dr last week. She is being set up for a colonoscopy. She reports not sleeping well despite taking Ambien 10mg  daily. She reports sleep study was normal. She does not snore. She continues to have a tremor. Worse on left hand but can be both. Tremor is worse with activity. If sitting still, no tremor. She reports drinking at least 80oz of water daily.   04/19/2022 ALL: Julie Gilbert is a 72 y.o. female here today for follow up for migraines. She continues emgality and topiramate 50mg  in am and 100mg  at bedtime. She reports that headaches wax and wane. She has severe allergies and reports this contributed to some months of worsening. She has had really bad allergies this year. She can't breath well in the hot weather. She reports that headaches are much better on Emgality and topiramate. Most months she has 1-2 headaches days easily aborted with Tylenol and rest. She has lost 30lbs with diet changes. She does report that injection seems to sting a little more now. She is not  interested in due to fears of worsening constipation. She tries to drink 4 bottles of water daily.   HISTORY (copied from Dr Wynelle Link previous note)  10/19/2021: Emgality has been working great. 04/21/2022 made her sick. She has been doing excellent on the [employer]. She was a little dizzy on standing, her BP slightly elevated, we had to get her a wheelchair. Today she feels bad and has been feeling bad since the repatha. 80 does not work when she has a migraine. She stayed on her Topamax and she was doing well. Patient not feeling well after taking repatha, looks ill today, says she has been feeling unwell since taking it, her BP is slightly elevated, she appears to be tremulous, I called [facility] and they have an 8 hour wait. [facility] and no wait time but I was able to call her pcp Dr. and got her an appointment at 330. Daughter is here as well and provides information, she has not been feeling  well since Repatha. She has been doing fantastic on Emgality, barely 2 total headaches a month   HPI 05/20/2021:  Julie Gilbert is a 72 y.o. female here as requested by Vivi Barrack, MD for migraines. PMHx migraine, anxiety, asthma, chronic kidney disease, coronary artery disease, diabetes, dyslipidemia, hypertension, depression, osteoarthritis, lacunar stroke, B12 deficiency, vertigo. Here with dauhter who also provides information. She is doing a lot better with her migraines. She has a cardiologist. She is on a heart monitor. Her migraines cn be severe in the left side back and side, pulsating/pounding/throbbing/, pulsating/pounding/throbbing, photophobia/phonophobia, most severe pain is the back in the left, the Emgality helps a lot, she is also on Topamax. Extremely severe. Nausea, she would pass out it was so severe. Used to have daily headaches, weather can be a trigger, a lot less maybe about 6 a month, was not able to get the [place].  Mother had tremors. With action and posture. Order  Roselyn Meier for acute management. Preventative Topamax and emgality. Discussed botox.No other focal neurologic deficits, associated symptoms, inciting events or modifiable factors.   Reviewed notes, labs and imaging from outside physicians, which showed:   From a thorough review of records, medications tried that can be used in migraine management include: Amlodipine (calcium channel blocker similar to verapamil), aspirin, Fioricet, Voltaren tablet, Benadryl, Emgality, Lexapro, gabapentin, meclizine, melatonin, Reglan injections, naproxen, Zofran, prednisone tablets, Phenergan tablets, Imitrex, Topamax, trazodone, propranolol contraindicated due to asthma, amitriptyline/nortrio contraindicated due to being on lexapro, TRIPTANS CONTRAINDICATED due to strokes   REVIEW OF SYSTEMS: Out of a complete 14 system review of symptoms, the patient complains only of the following symptoms, headaches, seasonal allergies, constipation and all other reviewed systems are negative.   ALLERGIES: Allergies  Allergen Reactions   Peanuts [Peanut Oil] Anaphylaxis   Shrimp [Shellfish Allergy] Anaphylaxis   Latex Hives   Elemental Sulfur Itching and Swelling   Iodinated Contrast Media Hives    Pt reported she had hives after recent procedure w/ CT contrast.    Other Other (See Comments)    Adhesive on EKG pads irritated skin   Penicillins Hives    Has patient had a PCN reaction causing immediate rash, facial/tongue/throat swelling, SOB or lightheadedness with hypotension: No Has patient had a PCN reaction causing severe rash involving mucus membranes or skin necrosis: No Has patient had a PCN reaction that required hospitalization: No Has patient had a PCN reaction occurring within the last 10 years: No  If all of the above answers are "NO", then may proceed with Cephalosporin use.     Pineapple Swelling and Other (See Comments)    Scratchy throat, Tongue swelling   Repatha [Evolocumab] Other (See Comments)     Dizzy, scratchy eyes/throat, palpitations   Sulfa Antibiotics Hives     HOME MEDICATIONS: Outpatient Medications Prior to Visit  Medication Sig Dispense Refill   albuterol (VENTOLIN HFA) 108 (90 Base) MCG/ACT inhaler TAKE 2 PUFFS BY MOUTH EVERY 6 HOURS AS NEEDED FOR WHEEZE OR SHORTNESS OF BREATH 8.5 each 1   amLODipine (NORVASC) 5 MG tablet TAKE 1 TABLET (5 MG TOTAL) BY MOUTH DAILY. 90 tablet 3   Ascorbic Acid (VITAMIN C) 1000 MG tablet Take 1,000 mg by mouth at bedtime.     aspirin EC 81 MG tablet Take 81 mg by mouth every morning. Swallow whole.     atorvastatin (LIPITOR) 40 MG tablet Take 1 tablet (40 mg total) by mouth daily. 90 tablet 3   Blood Glucose Monitoring Suppl (  ACCU-CHEK AVIVA PLUS) w/Device KIT Use as directed to check blood sugar once daily. E11.9 1 kit 0   Blood Pressure Monitoring (BLOOD PRESSURE KIT) DEVI 1 application by Does not apply route daily. Use to check blood pressure daily 1 Device 0   cholecalciferol (VITAMIN D3) 25 MCG (1000 UNIT) tablet Take 1,000 Units by mouth at bedtime.     COD LIVER OIL PO Take 1-2 capsules by mouth See admin instructions. Take one capsule by mouth every morning and take two capsules at night     escitalopram (LEXAPRO) 10 MG tablet Take 1 tablet (10 mg total) by mouth daily. (Patient taking differently: Take 10 mg by mouth every morning.) 90 tablet 3   fluticasone (FLONASE) 50 MCG/ACT nasal spray PLACE 1 SPRAY INTO BOTH NOSTRILS DAILY AS NEEDED FOR ALLERGIES OR RHINITIS. 48 mL 3   GARLIC PO Take 1 tablet by mouth at bedtime.     glucose blood (ACCU-CHEK AVIVA PLUS) test strip Use as instructed to check blood sugar once daily. E11.9 100 each 12   Lancets (ACCU-CHEK SOFT TOUCH) lancets Use as instructed to check blood sugar once daily E11.9 100 each 12   losartan (COZAAR) 25 MG tablet Take 1 tablet (25 mg total) by mouth daily. 90 tablet 3   meclizine (ANTIVERT) 25 MG tablet Take 1 tablet (25 mg total) by mouth 3 (three) times daily. 180  tablet 3   Misc Natural Products (CRANBERRY/PROBIOTIC PO) Take 2 capsules by mouth every morning.     Multiple Vitamin (MULTIVITAMIN WITH MINERALS) TABS tablet Take 1 tablet by mouth at bedtime.     Omega-3 Fatty Acids (FISH OIL PO) Take 1 capsule by mouth at bedtime.     OVER THE COUNTER MEDICATION Take 1 capsule by mouth at bedtime. Mother's nature for menopause     pantoprazole (PROTONIX) 40 MG tablet Take 40 mg by mouth every morning.     pregabalin (LYRICA) 50 MG capsule 50 mg daily.     SYMBICORT 160-4.5 MCG/ACT inhaler TAKE 2 PUFFS BY MOUTH TWICE A DAY (Patient taking differently: 2 puffs 2 (two) times daily as needed (shortness of breath/wheezing).) 30.6 each 3   topiramate (TOPAMAX) 50 MG tablet Take 1 tablet (50 mg total) by mouth in the morning AND 2 tablets (100 mg total) at bedtime. 270 tablet 3   zolpidem (AMBIEN) 10 MG tablet Take 10 mg by mouth at bedtime.     EMGALITY 120 MG/ML SOAJ Inject 120 ml monthly. 1 mL 11   clonazePAM (KLONOPIN) 1 MG tablet Take 1 tablet (1 mg total) by mouth 3 (three) times daily as needed for anxiety. (Patient not taking: Reported on 10/25/2022) 20 tablet 0   No facility-administered medications prior to visit.     PAST MEDICAL HISTORY: Past Medical History:  Diagnosis Date   Anxiety 05/18/2018   Asthma    Chronic anemia 06/29/2016   Chronic pain of both knees 02/15/2019   CKD stage 3 due to type 2 diabetes mellitus (HCC) 05/18/2018   Coronary artery disease 10/01/2018   Diabetes mellitus without complication (HCC)    Dyslipidemia associated with type 2 diabetes mellitus (HCC) 05/18/2018   Lab Results Component Value Date  CHOL 198 08/17/2018  HDL 62.40 08/17/2018  LDLCALC 113 (H) 08/17/2018  TRIG 113.0 08/17/2018  CHOLHDL 3 08/17/2018     Gastroesophageal reflux disease without esophagitis 06/18/2015   Herniated intervertebral disc of lumbar spine 05/01/2017   Hypertension    Hypertension associated with diabetes (HCC) 05/13/2015  IBS (irritable  bowel syndrome)    Insomnia 05/13/2015   Migraine headache 05/13/2015   Moderate episode of recurrent major depressive disorder ([place]) 01/04/2017   Osteoarthritis of spine 02/03/2017   Transient ischemic attack (TIA) 06/28/2016   Type 2 diabetes mellitus with neurological complications ([place]) 4/43/1540   Lab Results Component Value Date  HGBA1C 5.9 08/17/2018  HGBA1C 5.8 01/04/2017  HGBA1C 6.4 (H) 06/29/2016  Lab Results Component Value Date  MICROALBUR 10.3 (H) 08/17/2018  LDLCALC 113 (H) 08/17/2018  CREATININE 1.56 (H) 08/17/2018       PAST SURGICAL HISTORY: Past Surgical History:  Procedure Laterality Date   CESAREAN SECTION     EXPLORATORY LAPAROTOMY       FAMILY HISTORY: Family History  Problem Relation Age of Onset   Kidney failure Mother    Hypertension Mother    Heart disease Father    Cancer Maternal Grandfather    Migraines Other    Breast cancer Neg Hx      SOCIAL HISTORY: Social History   Socioeconomic History   Marital status: Divorced    Spouse name: Not on file   Number of children: Not on file   Years of education: Not on file   Highest education level: Not on file  Occupational History   Not on file  Tobacco Use   Smoking status: Former    Types: Cigarettes    Quit date: 09/22/2012    Years since quitting: 10.0   Smokeless tobacco: Never  Vaping Use   Vaping Use: Never used  Substance and Sexual Activity   Alcohol use: Yes    Alcohol/week: 1.0 standard drink of alcohol    Types: 1 Glasses of wine per week    Comment: red wine 1-2 x month   Drug use: No   Sexual activity: Not Currently  Other Topics Concern   Not on file  Social History Narrative   Lives with daughter Kimberle Stanfill)    Right handed   Caffeine: none    Social Determinants of Health   Financial Resource Strain: Low Risk  (05/12/2022)   Overall Financial Resource Strain (CARDIA)    Difficulty of Paying Living Expenses: Not hard at all  Food Insecurity: No Food Insecurity  (05/12/2022)   Hunger Vital Sign    Worried About Running Out of Food in the Last Year: Never true    Ran Out of Food in the Last Year: Never true  Transportation Needs: No Transportation Needs (05/12/2022)   PRAPARE - Hydrologist (Medical): No    Lack of Transportation (Non-Medical): No  Physical Activity: Insufficiently Active (05/12/2022)   Exercise Vital Sign    Days of Exercise per Week: 3 days    Minutes of Exercise per Session: 40 min  Stress: No Stress Concern Present (05/12/2022)   [place]    Feeling of Stress : Only a little  Social Connections: Moderately Integrated (05/12/2022)   Social Connection and Isolation Panel [NHANES]    Frequency of Communication with Friends and Family: More than three times a week    Frequency of Social Gatherings with Friends and Family: More than three times a week    Attends Religious Services: More than 4 times per year    Active Member of [employer] or Organizations: Yes    Attends Music therapist: More than 4 times per year    Marital Status: Divorced  Intimate Partner Violence: Not  At Risk (05/12/2022)   Humiliation, Afraid, Rape, and Kick questionnaire    Fear of Current or Ex-Partner: No    Emotionally Abused: No    Physically Abused: No    Sexually Abused: No     PHYSICAL EXAM  Vitals:   10/25/22 0913 10/25/22 0922  BP: (!) 183/99 (!) 157/99  Pulse: 63 62  Weight: 162 lb 12.8 oz (73.8 kg)   Height: 5\' 8"  (1.727 m)      Body mass index is 24.75 kg/m. Exam: Has migraines, appears in pain Speech:    Speech is normal; fluent and spontaneous with normal comprehension.  Cognition:    The patient is oriented to person, place, and time;     recent and remote memory intact;     language fluent;    Cranial Nerves:    The pupils are equal, round, and reactive to light.Trigeminal sensation is intact and the muscles of  mastication are normal. The face is symmetric. The palate elevates in the midline. Hearing intact. Voice is normal. Shoulder shrug is normal. The tongue has normal motion without fasciculations.   Coordination:  No dysmetria  Motor Observation:    No asymmetry, no atrophy, and no involuntary movements noted. Tone:    Normal muscle tone.     Strength:    Strength is V/V in the upper and lower limbs.      Sensation: intact to LT     DIAGNOSTIC DATA (LABS, IMAGING, TESTING) - I reviewed patient records, labs, notes, testing and imaging myself where available.  Lab Results  Component Value Date   WBC 5.0 02/16/2022   HGB 11.0 (L) 02/16/2022   HCT 34.6 (L) 02/16/2022   MCV 89.4 02/16/2022   PLT 164 02/16/2022      Component Value Date/Time   NA 140 02/17/2022 1004   NA 140 10/04/2021 1101   K 4.4 02/17/2022 1004   CL 110 02/17/2022 1004   CO2 17 (L) 02/17/2022 1004   GLUCOSE 78 02/17/2022 1004   BUN 18 02/17/2022 1004   BUN 23 10/04/2021 1101   CREATININE 2.22 (H) 02/17/2022 1004   CREATININE 1.71 (H) 07/05/2018 1231   CREATININE 1.10 (H) 01/09/2017 1130   CALCIUM 9.7 02/17/2022 1004   PROT 7.7 02/16/2022 2108   PROT 6.8 09/21/2021 0803   ALBUMIN 4.6 02/16/2022 2108   ALBUMIN 4.4 09/21/2021 0803   AST 21 02/16/2022 2108   AST 20 07/05/2018 1231   ALT 15 02/16/2022 2108   ALT 12 07/05/2018 1231   ALKPHOS 48 02/16/2022 2108   BILITOT 0.7 02/16/2022 2108   BILITOT 0.3 09/21/2021 0803   BILITOT 0.7 07/05/2018 1231   GFRNONAA 24 (L) 02/17/2022 1004   GFRNONAA 30 (L) 07/05/2018 1231   GFRNONAA 54 (L) 01/09/2017 1130   GFRAA 39 (L) 12/06/2019 1045   GFRAA 35 (L) 07/05/2018 1231   GFRAA 62 01/09/2017 1130   Lab Results  Component Value Date   CHOL 196 09/21/2021   HDL 69 09/21/2021   LDLCALC 111 (H) 09/21/2021   TRIG 92 09/21/2021   CHOLHDL 2.8 09/21/2021   Lab Results  Component Value Date   HGBA1C 6.1 (H) 02/17/2022   Lab Results  Component Value Date    VITAMINB12 >1550 (H) 02/11/2021   Lab Results  Component Value Date   TSH 2.48 02/11/2021        No data to display               No data  to display           ASSESSMENT AND PLAN  72 y.o. year old female  has a past medical history of Anxiety (05/18/2018), Asthma, Chronic anemia (06/29/2016), Chronic pain of both knees (02/15/2019), CKD stage 3 due to type 2 diabetes mellitus ([place]) (05/18/2018), Coronary artery disease (10/01/2018), Diabetes mellitus without complication ([place]), Dyslipidemia associated with type 2 diabetes mellitus ([place]) (05/18/2018), Gastroesophageal reflux disease without esophagitis (06/18/2015), Herniated intervertebral disc of lumbar spine (05/01/2017), Hypertension, Hypertension associated with diabetes ([place]) (05/13/2015), IBS (irritable bowel syndrome), Insomnia (05/13/2015), Migraine headache (05/13/2015), Moderate episode of recurrent major depressive disorder ([place]) (01/04/2017), Osteoarthritis of spine (02/03/2017), Transient ischemic attack (TIA) (06/28/2016), and Type 2 diabetes mellitus with neurological complications ([place]) (03/11/736). here with   Continue emgality and topamax for prevention, usually well controlled with only 4 migraines a month and < 8 total ha days a month, triptans contraindicated due to stroke but has tried iitrex and maxalt in the past.  Acute/emergency: try nurtec. Ubrelvy did not help. Take at onset of migraines. If having breakthrough the last week prior to emgality take one every day until your shot. Take shot as soon as available.  Brought her to infusion today for status migrainosus (missed emgality), filled out paperwork, daughter here to take her home  Called pharmacy, they moved to [facility], was on hold for amount of time, talked to pharmacist they have one emgality left and holding it for her.   Meds ordered this encounter  Medications   EMGALITY 120 MG/ML SOAJ    Sig: Inject 120 ml monthly.    Dispense:  1 mL    Refill:  11     Pt [facility], not [facility] [place] approved   Rimegepant Sulfate (NURTEC) 75 MG TBDP    Sig: Take 1 tablet (75 mg total) by mouth daily as needed. For migraines. Take as close to onset of migraine as possible. One daily maximum.    Dispense:  10 tablet    Refill:  0   Rimegepant Sulfate (NURTEC) 75 MG TBDP    Sig: Take 1 tablet (75 mg total) by mouth daily as needed. For migraines. Take as close to onset of migraine as possible. One daily maximum.    Dispense:  16 tablet    Refill:  11    Has 4 migraines a month and < 8 total headache days a month. Triptan contraindicated due to strokes but has tried imitrex and maxalt in the past     [facility] [address] [place], [place] [address] [phone]  I spent 60 minutes of face-to-face and non-face-to-face time with patient on the  1. Migraine with aura and with status migrainosus, not intractable   2. Acute migraine   3. Chronic migraine without aura without status migrainosus, not intractable   4. Acute migraine   5. Status migrainosus    diagnosis.  This included previsit chart review, lab review, study review, order entry, electronic health record documentation, patient education on the different diagnostic and therapeutic options, counseling and coordination of care, risks and benefits of management, compliance, or risk factor reduction

## 2022-11-02 DIAGNOSIS — E119 Type 2 diabetes mellitus without complications: Secondary | ICD-10-CM | POA: Diagnosis not present

## 2022-11-02 DIAGNOSIS — D539 Nutritional anemia, unspecified: Secondary | ICD-10-CM | POA: Diagnosis not present

## 2022-11-02 DIAGNOSIS — F32A Depression, unspecified: Secondary | ICD-10-CM | POA: Diagnosis not present

## 2022-11-02 DIAGNOSIS — R001 Bradycardia, unspecified: Secondary | ICD-10-CM | POA: Diagnosis not present

## 2022-11-02 DIAGNOSIS — J45909 Unspecified asthma, uncomplicated: Secondary | ICD-10-CM | POA: Diagnosis not present

## 2022-11-02 DIAGNOSIS — I10 Essential (primary) hypertension: Secondary | ICD-10-CM | POA: Diagnosis not present

## 2022-11-02 DIAGNOSIS — R55 Syncope and collapse: Secondary | ICD-10-CM | POA: Diagnosis not present

## 2022-11-02 DIAGNOSIS — M5432 Sciatica, left side: Secondary | ICD-10-CM | POA: Diagnosis not present

## 2022-11-02 DIAGNOSIS — I959 Hypotension, unspecified: Secondary | ICD-10-CM | POA: Diagnosis not present

## 2022-11-02 DIAGNOSIS — R42 Dizziness and giddiness: Secondary | ICD-10-CM | POA: Diagnosis not present

## 2022-11-02 DIAGNOSIS — R110 Nausea: Secondary | ICD-10-CM | POA: Diagnosis not present

## 2022-11-02 DIAGNOSIS — E559 Vitamin D deficiency, unspecified: Secondary | ICD-10-CM | POA: Diagnosis not present

## 2022-11-02 DIAGNOSIS — K219 Gastro-esophageal reflux disease without esophagitis: Secondary | ICD-10-CM | POA: Diagnosis not present

## 2022-11-02 DIAGNOSIS — G4700 Insomnia, unspecified: Secondary | ICD-10-CM | POA: Diagnosis not present

## 2022-11-03 ENCOUNTER — Other Ambulatory Visit: Payer: Self-pay

## 2022-11-03 ENCOUNTER — Emergency Department (HOSPITAL_COMMUNITY)
Admission: EM | Admit: 2022-11-03 | Discharge: 2022-11-03 | Disposition: A | Discharge status: Home / Self Care | Payer: 59 | Attending: Emergency Medicine | Admitting: Emergency Medicine

## 2022-11-03 ENCOUNTER — Emergency Department (HOSPITAL_COMMUNITY): Payer: 59

## 2022-11-03 ENCOUNTER — Encounter (HOSPITAL_COMMUNITY): Payer: Self-pay

## 2022-11-03 DIAGNOSIS — E1122 Type 2 diabetes mellitus with diabetic chronic kidney disease: Secondary | ICD-10-CM | POA: Insufficient documentation

## 2022-11-03 DIAGNOSIS — Z91040 Latex allergy status: Secondary | ICD-10-CM | POA: Diagnosis not present

## 2022-11-03 DIAGNOSIS — R55 Syncope and collapse: Secondary | ICD-10-CM | POA: Diagnosis not present

## 2022-11-03 DIAGNOSIS — R519 Headache, unspecified: Secondary | ICD-10-CM | POA: Diagnosis not present

## 2022-11-03 DIAGNOSIS — N183 Chronic kidney disease, stage 3 unspecified: Secondary | ICD-10-CM | POA: Diagnosis not present

## 2022-11-03 DIAGNOSIS — I6381 Other cerebral infarction due to occlusion or stenosis of small artery: Secondary | ICD-10-CM | POA: Diagnosis not present

## 2022-11-03 DIAGNOSIS — J45909 Unspecified asthma, uncomplicated: Secondary | ICD-10-CM | POA: Diagnosis not present

## 2022-11-03 DIAGNOSIS — I2510 Atherosclerotic heart disease of native coronary artery without angina pectoris: Secondary | ICD-10-CM | POA: Diagnosis not present

## 2022-11-03 DIAGNOSIS — Z79899 Other long term (current) drug therapy: Secondary | ICD-10-CM | POA: Diagnosis not present

## 2022-11-03 DIAGNOSIS — I129 Hypertensive chronic kidney disease with stage 1 through stage 4 chronic kidney disease, or unspecified chronic kidney disease: Secondary | ICD-10-CM | POA: Diagnosis not present

## 2022-11-03 DIAGNOSIS — Z91010 Allergy to peanuts: Secondary | ICD-10-CM | POA: Insufficient documentation

## 2022-11-03 LAB — BASIC METABOLIC PANEL
Anion gap: 10 (ref 5–15)
BUN: 18 mg/dL (ref 8–23)
CO2: 18 mmol/L — ABNORMAL LOW (ref 22–32)
Calcium: 8.4 mg/dL — ABNORMAL LOW (ref 8.9–10.3)
Chloride: 106 mmol/L (ref 98–111)
Creatinine, Ser: 1.82 mg/dL — ABNORMAL HIGH (ref 0.44–1.00)
GFR, Estimated: 30 mL/min — ABNORMAL LOW (ref >60)
Glucose, Bld: 101 mg/dL — ABNORMAL HIGH (ref 70–99)
Potassium: 5 mmol/L (ref 3.5–5.1)
Sodium: 134 mmol/L — ABNORMAL LOW (ref 135–145)

## 2022-11-03 LAB — CBC WITH DIFFERENTIAL/PLATELET
Abs Immature Granulocytes: 0.02 10*3/uL (ref 0.00–0.07)
Basophils Absolute: 0 10*3/uL (ref 0.0–0.1)
Basophils Relative: 1 %
Eosinophils Absolute: 0 10*3/uL (ref 0.0–0.5)
Eosinophils Relative: 1 %
HCT: 29.5 % — ABNORMAL LOW (ref 36.0–46.0)
Hemoglobin: 9.4 g/dL — ABNORMAL LOW (ref 12.0–15.0)
Immature Granulocytes: 0 %
Lymphocytes Relative: 26 %
Lymphs Abs: 1.6 10*3/uL (ref 0.7–4.0)
MCH: 28.8 pg (ref 26.0–34.0)
MCHC: 31.9 g/dL (ref 30.0–36.0)
MCV: 90.5 fL (ref 80.0–100.0)
Monocytes Absolute: 0.5 10*3/uL (ref 0.1–1.0)
Monocytes Relative: 8 %
Neutro Abs: 4 10*3/uL (ref 1.7–7.7)
Neutrophils Relative %: 64 %
Platelets: 91 10*3/uL — ABNORMAL LOW (ref 150–400)
RBC: 3.26 MIL/uL — ABNORMAL LOW (ref 3.87–5.11)
RDW: 14.1 % (ref 11.5–15.5)
WBC: 6.1 10*3/uL (ref 4.0–10.5)
nRBC: 0 % (ref 0.0–0.2)

## 2022-11-03 LAB — CBG MONITORING, ED: Glucose-Capillary: 84 mg/dL (ref 70–99)

## 2022-11-03 MED ORDER — SODIUM CHLORIDE 0.9 % IV BOLUS
1000.0000 mL | Freq: Once | INTRAVENOUS | Status: AC
  Administered 2022-11-03: 1000 mL via INTRAVENOUS

## 2022-11-03 NOTE — ED Provider Notes (Signed)
[facility] Provider Note   CSN: [id number] Arrival date & time: 11/03/22  0002     History  Chief Complaint  Patient presents with   Loss of Consciousness    Julie Gilbert is a 72 y.o. female.  HPI    This is a 72 year old female who presents with an episode of syncope.  Patient's daughter helps provide the history.  They report that she was watching TV in the living room and got up to go to the kitchen.  Daughter states that she heard a thud and the broom was knocked over.  She found her mother on the floor.  She states it took 1 to 2 minutes for her mother to regain consciousness.  She did not note any seizure-like activity.  Patient notes that she has had episodes like this in the past for "all of my life."  She reports extensive neurologic history including chronic migraines and vertigo.  Patient denies any prodrome.  She remembers walking to the kitchen and reaching out to open up the refrigerator and "the lights just went out."  She does not know exactly what workup she has undertaken but states she has seen doctors in the past and has not been told or given a diagnosis.  Daughter reports that she has similar episodes as well.  She is reporting a little bit of a headache.  She is not on any anticoagulants.  No other physical pain. Home Medications Prior to Admission medications   Medication Sig Start Date End Date Taking? Authorizing Provider  albuterol (VENTOLIN HFA) 108 (90 Base) MCG/ACT inhaler TAKE 2 PUFFS BY MOUTH EVERY 6 HOURS AS NEEDED FOR WHEEZE OR SHORTNESS OF BREATH 05/27/22   Vivi Barrack, MD  amLODipine (NORVASC) 5 MG tablet TAKE 1 TABLET (5 MG TOTAL) BY MOUTH DAILY. 07/27/22   Marin Olp, MD  Ascorbic Acid (VITAMIN C) 1000 MG tablet Take 1,000 mg by mouth at bedtime.    [provider]  aspirin EC 81 MG tablet Take 81 mg by mouth every morning. Swallow whole.    [provider]  atorvastatin  (LIPITOR) 40 MG tablet Take 1 tablet (40 mg total) by mouth daily. 07/01/22   Skeet Latch, MD  Blood Glucose Monitoring Suppl (ACCU-CHEK AVIVA PLUS) w/Device KIT Use as directed to check blood sugar once daily. E11.9 06/20/17   Tresa Garter, MD  Blood Pressure Monitoring (BLOOD PRESSURE KIT) DEVI 1 application by Does not apply route daily. Use to check blood pressure daily 06/20/17   Tresa Garter, MD  cholecalciferol (VITAMIN D3) 25 MCG (1000 UNIT) tablet Take 1,000 Units by mouth at bedtime.    [provider]  COD LIVER OIL PO Take 1-2 capsules by mouth See admin instructions. Take one capsule by mouth every morning and take two capsules at night    [provider]  EMGALITY 120 MG/ML SOAJ Inject 120 ml monthly. 10/25/22   Melvenia Beam, MD  escitalopram (LEXAPRO) 10 MG tablet Take 1 tablet (10 mg total) by mouth daily. Patient taking differently: Take 10 mg by mouth every morning. 03/22/21   Marin Olp, MD  fluticasone (FLONASE) 50 MCG/ACT nasal spray PLACE 1 SPRAY INTO BOTH NOSTRILS DAILY AS NEEDED FOR ALLERGIES OR RHINITIS. 05/27/22   [name], MD  GARLIC PO Take 1 tablet by mouth at bedtime.    [provider]  glucose blood (ACCU-CHEK AVIVA PLUS) test strip Use as  instructed to check blood sugar once daily. E11.9 06/20/17   Quentin Angst, MD  Lancets (ACCU-CHEK SOFT TOUCH) lancets Use as instructed to check blood sugar once daily E11.9 06/20/17   Quentin Angst, MD  losartan (COZAAR) 25 MG tablet Take 1 tablet (25 mg total) by mouth daily. 05/27/22   Chilton Si, MD  meclizine (ANTIVERT) 25 MG tablet Take 1 tablet (25 mg total) by mouth 3 (three) times daily. 05/02/22   Deeann Saint, MD  Misc Natural Products (CRANBERRY/PROBIOTIC PO) Take 2 capsules by mouth every morning.    [provider]  Multiple Vitamin (MULTIVITAMIN WITH MINERALS) TABS tablet Take 1 tablet by mouth at bedtime.    [provider]  Omega-3 Fatty Acids (FISH OIL PO) Take 1 capsule by mouth at bedtime.    [provider]  OVER THE COUNTER MEDICATION Take 1 capsule by mouth at bedtime. Mother's nature for menopause    [provider]  pantoprazole (PROTONIX) 40 MG tablet Take 40 mg by mouth every morning. 12/24/21   [provider]  pregabalin (LYRICA) 50 MG capsule 50 mg daily. 07/01/22   [provider]  Rimegepant Sulfate (NURTEC) 75 MG TBDP Take 1 tablet (75 mg total) by mouth daily as needed. For migraines. Take as close to onset of migraine as possible. One daily maximum. 10/25/22   Anson Fret, MD  Rimegepant Sulfate (NURTEC) 75 MG TBDP Take 1 tablet (75 mg total) by mouth daily as needed. For migraines. Take as close to onset of migraine as possible. One daily maximum. 10/25/22   Anson Fret, MD  SYMBICORT 160-4.5 MCG/ACT inhaler TAKE 2 PUFFS BY MOUTH TWICE A DAY Patient taking differently: 2 puffs 2 (two) times daily as needed (shortness of breath/wheezing). 08/04/21   Ardith Dark, MD  topiramate (TOPAMAX) 50 MG tablet Take 1 tablet (50 mg total) by mouth in the morning AND 2 tablets (100 mg total) at bedtime. 04/19/22   Lomax, Amy, NP  zolpidem (AMBIEN) 10 MG tablet Take 10 mg by mouth at bedtime. 06/02/22   [provider]      Allergies    Peanuts [peanut oil], Shrimp [shellfish allergy], Latex, Elemental sulfur, Iodinated contrast media, Other, Penicillins, Pineapple, Repatha [evolocumab], and Sulfa antibiotics    Review of Systems   Review of Systems  Constitutional:  Negative for fever.  Respiratory:  Negative for shortness of breath.   Cardiovascular:  Negative for chest pain.  Neurological:  Positive for syncope and headaches.  All other systems reviewed and are negative.   Physical Exam Updated Vital Signs BP (!) 148/90   Pulse 79   Temp 98.3 F (36.8 C) (Oral)   Resp 20   Ht 1.727 m (5\' 8" )   Wt 73.5 kg   LMP  (LMP Unknown) Comment:  Menopausal  SpO2 100%   BMI 24.63 kg/m  Physical Exam Vitals and nursing note reviewed.  Constitutional:      Appearance: She is well-developed. She is not ill-appearing.  HENT:     Head: Normocephalic and atraumatic.     Nose: Nose normal.     Mouth/Throat:     Mouth: Mucous membranes are moist.  Eyes:     Extraocular Movements: Extraocular movements intact.     Pupils: Pupils are equal, round, and reactive to light.  Neck:     Comments: No midline C-spine tenderness to palpation, step-off, deformity Cardiovascular:     Rate and Rhythm: Normal rate and  regular rhythm.     Heart sounds: Normal heart sounds.  Pulmonary:     Effort: Pulmonary effort is normal. No respiratory distress.     Breath sounds: No wheezing.  Abdominal:     General: Bowel sounds are normal.     Palpations: Abdomen is soft.  Musculoskeletal:     Cervical back: Normal range of motion and neck supple.  Skin:    General: Skin is warm and dry.  Neurological:     Mental Status: She is alert and oriented to person, place, and time.     Comments: 5 out of 5 strength in all 4 extremities, no dysmetria to finger-nose-finger, fluent speech, cranial nerves II through XII intact  Psychiatric:        Mood and Affect: Mood normal.     ED Results / Procedures / Treatments   Labs (all labs ordered are listed, but only abnormal results are displayed) Labs Reviewed  BASIC METABOLIC PANEL - Abnormal; Notable for the following components:      Result Value   Sodium 134 (*)    CO2 18 (*)    Glucose, Bld 101 (*)    Creatinine, Ser 1.82 (*)    Calcium 8.4 (*)    GFR, Estimated 30 (*)    All other components within normal limits  CBC WITH DIFFERENTIAL/PLATELET - Abnormal; Notable for the following components:   RBC 3.26 (*)    Hemoglobin 9.4 (*)    HCT 29.5 (*)    Platelets 91 (*)    All other components within normal limits  CBG MONITORING, ED    EKG EKG Interpretation  Date/Time:  Thursday November 03 2022 00:23:37 EST Ventricular Rate:  62 PR Interval:  161 QRS Duration: 135 QT Interval:  487 QTC Calculation: 495 R Axis:   -26 Text Interpretation: Sinus rhythm Right bundle branch block Left ventricular hypertrophy Confirmed by Thayer Jew [phone]) on 11/03/2022 12:30:14 AM  Radiology CT HEAD WO CONTRAST  Result Date: 11/03/2022 CLINICAL DATA:  Head trauma. EXAM: CT HEAD WITHOUT CONTRAST TECHNIQUE: Contiguous axial images were obtained from the base of the skull through the vertex without intravenous contrast. RADIATION DOSE REDUCTION: This exam was performed according to the departmental dose-optimization program which includes automated exposure control, adjustment of the mA and/or kV according to patient size and/or use of iterative reconstruction technique. COMPARISON:  Head CT dated 09/27/2017. FINDINGS: Brain: The ventricles and sulci are appropriate size for patient's age. 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 There is no acute intracranial hemorrhage. No mass effect or midline shift. No extra-axial fluid collection. Vascular: No hyperdense vessel or unexpected calcification. Skull: Normal. Negative for fracture or focal lesion. Sinuses/Orbits: No acute finding. Other: None IMPRESSION: 1. No acute intracranial pathology. 2. 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 Electronically Signed   By: Anner Crete M.D.   On: 11/03/2022 01:13    Procedures Procedures    Medications Ordered in ED Medications  sodium chloride 0.9 % bolus 1,000 mL (0 mLs Intravenous Stopped 11/03/22 0259)    ED Course/ Medical Decision Making/ A&P Clinical Course as of 11/03/22 0458  Thu Nov 03, 2022  0239 Patient states that she is at her baseline.  Reports that she has been having these sorts of episodes since she was in first grade.  She reports multiple workups although I cannot confirm this in the chart.  I offered her admission for observation  but they declined.  She does have a cardiologist and will follow-up this week. [CH]    Clinical Course User Index [CH] Izmael Duross, Mayer Masker, MD                             Medical Decision Making Amount and/or Complexity of Data Reviewed Labs: ordered. Radiology: ordered.   This patient presents to the ED for concern of syncope, this involves an extensive number of treatment options, and is a complaint that carries with it a high risk of complications and morbidity.  I considered the following differential and admission for this acute, potentially life threatening condition.  The differential diagnosis includes syncope, vasovagal event, arrhythmia, dehydration, drop attacks  MDM:    This is a 72 year old female who presents with an episode of loss of consciousness.  She is overall nontoxic and vital signs are notable for blood pressure 148/90.  Patient states she has a significant history of episodes similar in the past.  Her daughter also reports similar episodes.  She does not have any formal diagnosis.  I cannot see anywhere in her chart where she has been worked up for syncope although both she and her daughter endorse that she has been worked up thoroughly previously.  She does not have any external signs of trauma.  Labs obtained and reviewed.  No significant metabolic derangements.  Creatinine at baseline.  No leukocytosis.  CT head is negative for acute trauma.  C-spine was cleared by Nexus criteria.  EKG does not show any evidence of acute arrhythmia or.  She does not have any chest pain.  Discussed the workup with the patient and her daughter.  Offered admission given concern for possible arrhythmia given that the episode did not have a prodrome.  However, given patient's history, she states that she wanted to go home and will follow-up with her cardiologist.  We discussed return precautions.  (Labs, imaging, consults)  Labs: I Ordered, and personally interpreted labs.  The pertinent  results include: CBC, BMP  Imaging Studies ordered: I ordered imaging studies including CT head I independently visualized and interpreted imaging. I agree with the radiologist interpretation  Additional history obtained from daughter at bedside.  External records from outside source obtained and reviewed including outpatient evaluation and cardiology notes  Cardiac Monitoring: The patient was maintained on a cardiac monitor.  If on the cardiac monitor, I personally viewed and interpreted the cardiac monitored which showed an underlying rhythm of: Sinus rhythm  Reevaluation: After the interventions noted above, I reevaluated the patient and found that they have :improved  Social Determinants of Health:  lives independently  Disposition: Discharge  Co morbidities that complicate the patient evaluation  Past Medical History:  Diagnosis Date   Anxiety 05/18/2018   Asthma    Chronic anemia 06/29/2016   Chronic pain of both knees 02/15/2019   CKD stage 3 due to type 2 diabetes mellitus (HCC) 05/18/2018   Coronary artery disease 10/01/2018   Diabetes mellitus without complication (HCC)    Dyslipidemia associated with type 2 diabetes mellitus (HCC) 05/18/2018   Lab Results Component Value Date  CHOL 198 08/17/2018  HDL 62.40 08/17/2018  LDLCALC 113 (H) 08/17/2018  TRIG 113.0 08/17/2018  CHOLHDL 3 08/17/2018     Gastroesophageal reflux disease without esophagitis 06/18/2015   Herniated intervertebral disc of lumbar spine 05/01/2017   Hypertension    Hypertension associated with diabetes (HCC) 05/13/2015   IBS (irritable bowel  syndrome)    Insomnia 05/13/2015   Migraine headache 05/13/2015   Moderate episode of recurrent major depressive disorder ([place]) 01/04/2017   Osteoarthritis of spine 02/03/2017   Transient ischemic attack (TIA) 06/28/2016   Type 2 diabetes mellitus with neurological complications ([place]) 6/97/9480   Lab Results Component Value Date  HGBA1C 5.9 08/17/2018  HGBA1C 5.8 01/04/2017   HGBA1C 6.4 (H) 06/29/2016  Lab Results Component Value Date  MICROALBUR 10.3 (H) 08/17/2018  LDLCALC 113 (H) 08/17/2018  CREATININE 1.56 (H) 08/17/2018       Medicines Meds ordered this encounter  Medications   sodium chloride 0.9 % bolus 1,000 mL    I have reviewed the patients home medicines and have made adjustments as needed  Problem List / ED Course: Problem List Items Addressed This Visit   None Visit Diagnoses     Syncope and collapse    -  Primary                   Final Clinical Impression(s) / ED Diagnoses Final diagnoses:  Syncope and collapse    Rx / DC Orders ED Discharge Orders     None         Merryl Hacker, MD 11/03/22 0501

## 2022-11-03 NOTE — Discharge Instructions (Signed)
You were seen today for an episode of loss of consciousness.  Follow-up with your cardiologist and neurologist closely.  Make sure that you are staying hydrated.

## 2022-11-03 NOTE — ED Triage Notes (Signed)
Pt BIB EMS having a syncopal episode. Pt was ambulating to the kitchen and then had a LOC. Per family, pt was unconscious for about 3 minutes. Pt endorses nausea and received 4mg  of zofran and 455ml of NS.    CBG 135 124/70

## 2022-11-10 ENCOUNTER — Telehealth: Payer: Self-pay | Admitting: Cardiovascular Disease

## 2022-11-10 MED ORDER — ATORVASTATIN CALCIUM 40 MG PO TABS: 40.0000 mg | ORAL_TABLET | Freq: Every day | ORAL | 1 refills | Status: DC

## 2022-11-10 NOTE — Telephone Encounter (Signed)
Rx request sent to pharmacy.  

## 2022-11-10 NOTE — Telephone Encounter (Signed)
*  STAT* If patient is at the pharmacy, call can be transferred to refill team.   1. Which medications need to be refilled? (please list name of each medication and dose if known) atorvastatin (LIPITOR) 40 MG tablet   2. Which pharmacy/location (including street and city if local pharmacy) is medication to be sent to? [id number] - [place],  - [address]   3. Do they need a 30 day or 90 day supply?  90 day

## 2022-11-11 ENCOUNTER — Telehealth: Payer: Self-pay

## 2022-11-11 NOTE — Telephone Encounter (Signed)
        Patient  visited [facility] on 11/03/2022  for Loss of Consciousness.   Telephone encounter attempt :  1st  A HIPAA compliant voice message was left requesting a return call.  Instructed patient to call back at [phone].   [facility] Resource Care Guide   [email or web address]  ?? [id number]   Website: [email or web address]  [email or web address]

## 2022-11-21 ENCOUNTER — Telehealth: Payer: Self-pay

## 2022-11-21 NOTE — Telephone Encounter (Signed)
        Patient  visited [facility] on 11/03/2022  for Loss of Consciousness.   Telephone encounter attempt :  2nd  A HIPAA compliant voice message was left requesting a return call.  Instructed patient to call back at [phone].   [facility] Resource Care Guide   [email or web address]  ?? [phone]   Website: [email or web address]  [email or web address]

## 2022-11-22 ENCOUNTER — Telehealth: Payer: Self-pay

## 2022-11-22 NOTE — Telephone Encounter (Signed)
        Patient  visited [facility] on 11/03/2022  for Loss of Consciousness.   Telephone encounter attempt :  3rd  A HIPAA compliant voice message was left requesting a return call.  Instructed patient to call back at [phone].   [facility] Resource Care Guide   [name]  ?? [phone]   Website: [email or web address]  [email or web address]

## 2022-11-24 DIAGNOSIS — E119 Type 2 diabetes mellitus without complications: Secondary | ICD-10-CM | POA: Diagnosis not present

## 2022-11-24 DIAGNOSIS — H2513 Age-related nuclear cataract, bilateral: Secondary | ICD-10-CM | POA: Diagnosis not present

## 2022-11-26 ENCOUNTER — Other Ambulatory Visit: Payer: Self-pay | Admitting: Family Medicine

## 2022-11-26 DIAGNOSIS — H8110 Benign paroxysmal vertigo, unspecified ear: Secondary | ICD-10-CM

## 2022-11-28 ENCOUNTER — Ambulatory Visit: Payer: Medicare Other | Admitting: Neurology

## 2022-11-28 ENCOUNTER — Encounter: Payer: Self-pay | Admitting: Neurology

## 2022-11-28 VITALS — BP 168/94 | HR 59 | Ht 67.0 in | Wt 163.0 lb

## 2022-11-28 DIAGNOSIS — R42 Dizziness and giddiness: Secondary | ICD-10-CM | POA: Diagnosis not present

## 2022-11-28 DIAGNOSIS — R569 Unspecified convulsions: Secondary | ICD-10-CM | POA: Diagnosis not present

## 2022-11-28 DIAGNOSIS — R55 Syncope and collapse: Secondary | ICD-10-CM | POA: Insufficient documentation

## 2022-11-28 DIAGNOSIS — F0781 Postconcussional syndrome: Secondary | ICD-10-CM | POA: Diagnosis not present

## 2022-11-28 DIAGNOSIS — R270 Ataxia, unspecified: Secondary | ICD-10-CM

## 2022-11-28 DIAGNOSIS — W19XXXA Unspecified fall, initial encounter: Secondary | ICD-10-CM

## 2022-11-28 DIAGNOSIS — H8110 Benign paroxysmal vertigo, unspecified ear: Secondary | ICD-10-CM

## 2022-11-28 DIAGNOSIS — S069X1A Unspecified intracranial injury with loss of consciousness of 30 minutes or less, initial encounter: Secondary | ICD-10-CM

## 2022-11-28 DIAGNOSIS — R4020 Unspecified coma: Secondary | ICD-10-CM

## 2022-11-28 DIAGNOSIS — G43709 Chronic migraine without aura, not intractable, without status migrainosus: Secondary | ICD-10-CM

## 2022-11-28 MED ORDER — MECLIZINE HCL 25 MG PO TABS: 25.0000 mg | ORAL_TABLET | Freq: Three times a day (TID) | ORAL | 3 refills | Status: DC

## 2022-11-28 MED ORDER — SCOPOLAMINE 1 MG/3DAYS TD PT72: 1.0000 | MEDICATED_PATCH | TRANSDERMAL | 0 refills | Status: DC

## 2022-11-28 MED ORDER — ONDANSETRON 4 MG PO TBDP: 4.0000 mg | ORAL_TABLET | Freq: Three times a day (TID) | ORAL | 3 refills | Status: AC | PRN

## 2022-11-28 NOTE — Patient Instructions (Addendum)
Cardiology - contact [facility] may full cardiac workup incuding holter, echo, stress per Dr. Oval Linsey clinical judgement Neurology - MRi brain with a seizure protocol and stroke protocol, MRI cervical spine, EEG here in the office and 3-day prolonged eeg, MRA brain Pulse is 53 - discuss with Dr. Oval Linsey.   Post-Concussion Syndrome  A concussion is a brain injury from a direct hit to the head or body. This hit causes the brain to shake back and forth fast inside the skull. The shaking can damage brain cells and cause chemical changes in the brain. Concussions are normally not life-threatening but can cause serious symptoms. Post-concussion syndrome is when symptoms that happen after a concussion last longer than normal. These symptoms can last from weeks to months. What are the causes? The cause of this condition is not known. It can happen whether your head injury was mild or severe. What increases the risk? You are more likely to get this condition if: You are female. You are a child, teen, or young adult. You have had a head injury before. You have a history of headaches. You have depression or anxiety. You have more than one symptom or severe symptoms when your concussion occurs. You faint or cannot remember the event (have amnesia of the event). What are the signs or symptoms? Symptoms of this condition include: Physical symptoms, such as: Headaches. Tiredness. Dizziness and weakness. Blurred vision and sensitivity to light. Trouble hearing. Problems with balance. Mental and emotional symptoms, such as: Memory problems and trouble focusing. Trouble falling asleep or staying asleep (insomnia). Feeling irritable. Anxiety or depression. Trouble learning new things. How is this diagnosed? This condition may be diagnosed based on: Your symptoms. A description of your injury. Your medical history. Testing your strength, balance, and nerve function (neurological  exam). Your health care provider may order other tests. These may include: Brain imaging, such as a CT scan or an MRI. Memory testing (neuropsychological testing). How is this treated? Treatment for this condition may depend on your symptoms. Symptoms normally go away on their own with time. If you need treatment, it may include: Medicines for headaches, anxiety, depression, and insomnia. Resting your brain and body for a few days after your injury. Rehab therapy, such as: Physical or occupational therapy. This may include exercises to help with balance and dizziness. Mental health counseling. A form of talk therapy called cognitive behavioral therapy (CBT) can be especially helpful. This therapy helps you set goals and follow up on the changes that you make. Speech therapy. Vision therapy. A brain and eye specialist can recommend treatments for vision problems. Follow these instructions at home: Medicines Take over-the-counter and prescription medicines only as told by your health care provider. Avoid opioid prescription pain medicines when getting over a concussion. Activity Limit your mental activities for the first few days after your injury. This may include not doing these things: Homework or work for your job. Complex thinking. Watching TV. Using a computer or phone. Playing memory games and puzzles. Slowly return to your normal activity level. If a certain activity brings on your symptoms, stop or slow down until you can do the activity without getting symptoms again. Limit physical activity, such as sports or strenuous activities, for the first few days after a concussion. Slowly return to normal activity as told by your health care provider. Light exercise may be helpful. Rest helps your brain heal. Make sure you: Get plenty of sleep at night. Most adults should get at least 7-9  hours of sleep each night. Rest during the day. Take naps or rest breaks when you feel tired. Do not  do high-risk activities that could cause a second concussion, such as riding a bike or playing sports. Having another concussion before the first one has healed can be harmful. General instructions  Do not drink alcohol until your health care provider says that you can. Keep track of how severe your symptoms are and how often they happen. Give this information to your health care provider. Keep all follow-up visits. Your health care provider may need to check you for new or serious symptoms. Where to find more information [place]: [email or web address] Contact a health care provider if: Your symptoms do not improve. You get injured again. You have unusual behavior changes. Get help right away if: You have a severe or worsening headache. You have weakness or numbness in any part of your body. You vomit repeatedly. You have mental status changes, such as: Confusion. Trouble speaking. Trouble staying awake. Fainting. You have a seizure. These symptoms may be an emergency. Get help right away. Call 911. Do not wait to see if the symptoms will go away. Do not drive yourself to the hospital. Also, get help right away if: You think about hurting yourself or others. Take one of these steps if you feel like you may hurt yourself or others, or have thoughts about taking your own life: Go to your nearest emergency room. Call 911. Call the [place] [place] at [phone] or [phone]. This is open 24 hours a day. Text the Crisis Text Line at (873) 157-6223. This information is not intended to replace advice given to you by your health care provider. Make sure you discuss any questions you have with your health care provider. Document Revised: 02/04/2022 Document Reviewed: 02/04/2022 Elsevier Patient Education  [place].  Scopolamine Patches What is this medication? SCOPOLAMINE (skoe POL a meen) prevents nausea and vomiting. It works by blocking  substances in your body that may cause nausea and vomiting. It belongs to a class of medications called antiemetics. This medicine may be used for other purposes; ask your health care provider or pharmacist if you have questions. COMMON BRAND NAME(S): Transderm Scop What should I tell my care team before I take this medication? They need to know if you have any of these conditions: Are scheduled to have a gastric secretion test Glaucoma Heart disease Kidney disease Liver disease Lung or breathing disease, such as asthma Mental health condition Prostate disease Seizures Stomach or intestine problems Trouble passing urine An unusual or allergic reaction to scopolamine, atropine, other medications, foods, dyes, or preservatives Pregnant or trying to get pregnant Breast-feeding How should I use this medication? This medication is for external use only. Follow the directions on the prescription label. Wear only 1 patch at a time. Choose an area behind the ear, that is clean, dry, hairless and free from any cuts or irritation. Wipe the area with a clean dry tissue. Peel off the plastic backing of the skin patch, trying not to touch the adhesive side with your hands. Do not cut the patches. Firmly apply to the area you have chosen, with the metallic side of the patch to the skin and the tan-colored side showing. Once firmly in place, wash your hands well with soap and water. Do not get this medication in your eyes. After removing the patch, wash your hands and the area behind your ear thoroughly with soap and water. The  patch will still contain some medication after use. To avoid accidental contact or ingestion by children or pets, fold the used patch in half with the sticky side together and throw away in the trash out of the reach of children and pets. If you need to use a second patch after you remove the first, place it behind the other ear. A special MedGuide will be given to you by the  pharmacist with each prescription and refill. Be sure to read this information carefully each time. Talk to your care team about the use of this medication in children. Special care may be needed. Overdosage: If you think you have taken too much of this medicine contact a poison control center or emergency room at once. NOTE: This medicine is only for you. Do not share this medicine with others. What if I miss a dose? This does not apply. This medication is not for regular use. What may interact with this medication? Alcohol Antihistamines for allergy cough and cold Atropine Certain medications for anxiety or sleep Certain medications for bladder problems, such as oxybutynin, tolterodine Certain medications for depression, such as amitriptyline, fluoxetine, sertraline Certain medications for Parkinson disease, such as benztropine, trihexyphenidyl Certain medications for seizures, such as phenobarbital, primidone Certain medications for stomach problems, such as dicyclomine, hyoscyamine General anesthetics, such as halothane, isoflurane, methoxyflurane, propofol Ipratropium Local anesthetics, such as lidocaine, pramoxine, tetracaine Medications that relax muscles for surgery Opioid medications for pain Other belladonna alkaloids Phenothiazines, such as chlorpromazine, mesoridazine, prochlorperazine, thioridazine This list may not describe all possible interactions. Give your health care provider a list of all the medicines, herbs, non-prescription drugs, or dietary supplements you use. Also tell them if you smoke, drink alcohol, or use illegal drugs. Some items may interact with your medicine. What should I watch for while using this medication? Limit contact with water while swimming and bathing because the patch may fall off. If the patch falls off, throw it away and put a new one behind the other ear. This medication may affect your coordination, reaction time, or judgment. Do not drive  or operate machinery until you know how this medication affects you. Sit up or stand slowly to reduce the risk of dizzy or fainting spells. Drinking alcohol with this medication can increase the risk of these side effects. Your mouth may get dry. Chewing sugarless gum or sucking hard candy, and drinking plenty of water may help. Contact your care team if the problem does not go away or is severe. This medication may cause dry eyes and blurred vision. If you wear contact lenses, you may feel some discomfort. Lubricating drops may help. See your care team if the problem does not go away or is severe. If you are going to need surgery, an MRI, CT scan, or other procedure, tell your care team that you are using this medication. You may need to remove the patch before the procedure. What side effects may I notice from receiving this medication? Side effects that you should report to your care team as soon as possible: Allergic reactions--skin rash, itching, hives, swelling of the face, lips, tongue, or throat Constipation, bloating, nausea or vomiting, stomach pain, which may be signs of slow movement through the digestive tract Mood and behavior changes--anxiety, nervousness, confusion, hallucinations, irritability, hostility, thoughts of suicide or self-harm, worsening mood, feelings of depression Seizures Sudden eye pain or change in vision such as blurry vision, seeing halos around lights, vision loss Trouble passing urine Side effects that usually  do not require medical attention (report to your care team if they continue or are bothersome): Confusion Dizziness Drowsiness Dry mouth Sore throat This list may not describe all possible side effects. Call your doctor for medical advice about side effects. You may report side effects to FDA at 1-800-FDA-1088. Where should I keep my medication? Keep out of the reach of children and pets. Store at room temperature between 20 and 25 degrees C (68 and 77  degrees F). Keep this medication in the foil package until ready to use. Get rid of any unused medication after the expiration date. NOTE: This sheet is a summary. It may not cover all possible information. If you have questions about this medicine, talk to your doctor, pharmacist, or health care provider.  2023 Elsevier/Gold Standard (2021-08-04 00:00:00)

## 2022-11-28 NOTE — Progress Notes (Signed)
Chief Complaint  Patient presents with   Follow-up    Pt in 4 with daughter Pt states 7 headaches in last month Pt states 1 fall in last month Pt states vertigo is worse     HISTORY OF PRESENT ILLNESS:  11/28/2022: Since being seen end of January doing better on migraines and better with vertigo. Hit your head, just fell backwards.She was walking to the fridge she collapsed back. She was "out for 5 minutes" had to rescuscutate. Her eyes rolled in the back of the head, eyes open, BP very low, said she was dehydrated but drinking plenty of water, no more episodes since then, mother did NOT feel she was going out, eyes open, rolled back, she wasn't reponding, she was confused when she cme to but recognized daughter, no focal weakness, wasn't breathing, no heartbeat, daughter was literllay doing CPR unclear though if, patient went completelt darknheaded, nor=t lightheaded, no warning, after a few minutes she was fine. She couldn't stand because of dizziness. This was years ago, she went to cardiologist years ago. She sees [name] need to get her an appointment, blood pressure per daughter was 98/70 but in the ED it was higher but unclear if this was after fluids. 8 glasses a day. Make sure to include electrolytes like gatorade so we don;t get hypona+ etc discussed with daughter who proc=vided most information. She has anemia but has been to hematology, she has CKD so could be anemia due to that see Dr. Nancy Fetter. Hit head so hard daughter heard it. Vertigo is constant, dizziness and she has it right now.   Personally reviewed images.   CT head 10/2022: TECHNIQUE: Contiguous axial images were obtained from the base of the skull through the vertex without intravenous contrast.   RADIATION DOSE REDUCTION: This exam was performed according to the departmental dose-optimization program which includes automated exposure control, adjustment of the mA and/or kV according to patient size and/or use of  iterative reconstruction technique.   COMPARISON:  Head CT dated 09/27/2017.   FINDINGS: Brain: The ventricles and sulci are appropriate size for patient's age. 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 There is no acute intracranial hemorrhage. No mass effect or midline shift. No extra-axial fluid collection.   Vascular: No hyperdense vessel or unexpected calcification.   Skull: Normal. Negative for fracture or focal lesion.   Sinuses/Orbits: No acute finding.   Other: None   IMPRESSION: 1. No acute intracranial pathology. 2. 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      10/25/2022: Dr. Jaynee Eagles.  This is a 72 year old patient we have seen in the past for multiple neurologic symptoms including migraines and lacunar infarcts.  Repeat MRI of the brain and orbits was ordered in October by my NP but does not look like they were completed.  We initially saw her for migraines past medical history migraine, anxiety, asthma, chronic kidney disease, lacunar infarcts, coronary artery disease, diabetes, dyslipidemia, hypertension, depression, osteoarthritis, B12 deficiency and vertigo.  Patient has several specialists following her including cardiology and when we first met her she was on a heart monitor.  We placed her on Emgality and Topamax which seemed to help prior to that she had daily headaches and treatment decreased it to 6 headache days a month.  She has a family history of tremors with action and posture and we kept her on Topamax and Emgality and she declined Botox.  Roselyn Meier  made her sick.  But we have kept her on Emgality which has worked.  She was started on Repatha in the past, Roselyn Meier did not work, we kept her on [employer] and Topamax.  Barely 2  total headaches a month on Emgaliy aborted by Tylenol.  She is also lost weight with diet changes.  She was not interested in [place] or Botox.  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  At that time she continued to work with PT for dizziness and vertigo and she was doing better.  Followed by Dr. Nancy Fetter at [facility] and pregabalin and meclizine 3 times daily.  She was weaned off clonazepam and and has insomnia as well.  We recommended an eye exam.  At that time she reported about 4 migraine days in headache days per month doing great on Emgality.  She is followed by Dr. Nancy Fetter last A1c 6.1 last LDL 111 and she continued aspirin 81 mg and atorvastatin and Repatha.  Sleep study was normal.  Tremors continue not at rest.  She continues to try to hydrate.  She has had a 2 week migraines but usually doing excellent on Emgality and only has 4 migraines a month will try nurtec acutely. She did miss her emgality. The weather and stress has affected her. Last emgality was Dec 28th so due for emgality now and her prescription. Put the prescription on autofill and as soon as its ready take it. Roselyn Meier did not work. We discussed continuing emgality and topamax but she needs a good acute management medication, triptans contraindicated due to strokes will try and get her nurtec. Take her to iv infusion today for status migrainosus.  Patient complains of symptoms per HPI as well as the following symptoms: status migrainosus . Pertinent negatives and positives per HPI. All others negative   07/25/2022 ALL:  Yvonna returns for an acute visit. She has suffered from vertigo for years. She was referred to PT by Dr Jaynee Eagles. We received a message regarding abnormal occulomotor and coordination testing 07/04/2022 Per report, she had "B UE and L>R LE dysmetria and B mild intention tremor, direction changing gaze-evoked nystagmus, saccades with vertical and horizontal smooth  pursuits, corrective saccades and difficulty maintaining focus with VOR cancellation." MRI in 11/2017 showed chronic lacunar infarcts in right caudate and left cerebellum, non specific white matter changes, mild to moderate for age, all stable from 2017 imaging.   She continues to work with PT. She reports that dizziness remains constant. Her daughter feels that she is doing better. She missed visits last week due to back pain. She is followed by Dr Nancy Fetter with [facility]. She is treated with cyclobenzaprine and pregabalin. She uses meclizine TID. She was weaned off clonazepam (anxiety) and started on Ambien '10mg'$  for insomnia. She denies vision loss. No double vision. She does have blurred vision with headaches only. She occasionally has left eye pain with migraines. She has not had an eye exam recently.   She continues Emgality monthly and topiramate '50mg'$  in am and '100mg'$  in pm for migraine prevention. She reports having about 4 headache days per month. Rest will abort headache. She feels that weather changes are most common trigger.   She is followed regularly by Dr  Sun. Last A1C 6.1. Last LDL 111. She continues asa '81mg'$  and atorvastatin '40mg'$ . She reported side effects with Repatha. She reports being told to take iron supplements last week. She reports low Hgb. Last hgb 11. She reports blood work was updated with Dr Nancy Fetter last week. She is being set up for a colonoscopy. She reports not sleeping well despite taking Ambien '10mg'$  daily. She reports sleep study was normal. She does not snore. She continues to have a tremor. Worse on left hand but can be both. Tremor is worse with activity. If sitting still, no tremor. She reports drinking at least 80oz of water daily.   04/19/2022 ALL: NEELA BERKLAND is a 72 y.o. female here today for follow up for migraines. She continues emgality and topiramate '50mg'$  in am and '100mg'$  at bedtime. She reports that headaches wax and wane. She has severe allergies and reports this  contributed to some months of worsening. She has had really bad allergies this year. She can't breath well in the hot weather. She reports that headaches are much better on Emgality and topiramate. Most months she has 1-2 headaches days easily aborted with Tylenol and rest. She has lost 30lbs with diet changes. She does report that injection seems to sting a little more now. She is not interested in [place] due to fears of worsening constipation. She tries to drink 4 bottles of water daily.   HISTORY (copied from Dr Cathren Laine previous note)  10/19/2021: Emgality has been working great. Roselyn Meier made her sick. She has been doing excellent on the [employer]. She was a little dizzy on standing, her BP slightly elevated, we had to get her a wheelchair. Today she feels bad and has been feeling bad since the repatha. Roselyn Meier does not work when she has a migraine. She stayed on her Topamax and she was doing well. Patient not feeling well after taking repatha, looks ill today, says she has been feeling unwell since taking it, her BP is slightly elevated, she appears to be tremulous, I called [facility] and they have an 8 hour wait. [facility] and no wait time but I was able to call her pcp Dr. Wynetta Emery and got her an appointment at 330. Daughter is here as well and provides information, she has not been feeling well since Repatha. She has been doing fantastic on Emgality, barely 2 total headaches a month   HPI 05/20/2021:  KEYMARI MAHERAS is a 72 y.o. female here as requested by Vivi Barrack, MD for migraines. PMHx migraine, anxiety, asthma, chronic kidney disease, coronary artery disease, diabetes, dyslipidemia, hypertension, depression, osteoarthritis, lacunar stroke, B12 deficiency, vertigo. Here with dauhter who also provides information. She is doing a lot better with her migraines. She has a cardiologist. She is on a heart monitor. Her migraines cn be severe in the left side back and side,  pulsating/pounding/throbbing/, pulsating/pounding/throbbing, photophobia/phonophobia, most severe pain is the back in the left, the Emgality helps a lot, she is also on Topamax. Extremely severe. Nausea, she would pass out it was so severe. Used to have daily headaches, weather can be a trigger, a lot less maybe about 6 a month, was not able to get the [place].  Mother had tremors. With action and posture. Order Roselyn Meier for acute management. Preventative Topamax and emgality. Discussed botox.No other focal neurologic deficits, associated symptoms, inciting events or modifiable factors.   Reviewed notes, labs and imaging from outside physicians, which showed:   From a thorough review of records, medications  tried that can be used in migraine management include: Amlodipine (calcium channel blocker similar to verapamil), aspirin, Fioricet, Voltaren tablet, Benadryl, Emgality, Lexapro, gabapentin, meclizine, melatonin, Reglan injections, naproxen, Zofran, prednisone tablets, Phenergan tablets, Imitrex, Topamax, trazodone, propranolol contraindicated due to asthma, amitriptyline/nortrio contraindicated due to being on lexapro, TRIPTANS CONTRAINDICATED due to strokes   REVIEW OF SYSTEMS: Out of a complete 14 system review of symptoms, the patient complains only of the following symptoms, headaches, seasonal allergies, constipation and all other reviewed systems are negative.   ALLERGIES: Allergies  Allergen Reactions   Peanuts [Peanut Oil] Anaphylaxis   Shrimp [Shellfish Allergy] Anaphylaxis   Latex Hives   Elemental Sulfur Itching and Swelling   Iodinated Contrast Media Hives    Pt reported she had hives after recent procedure w/ CT contrast.    Other Other (See Comments)    Adhesive on EKG pads irritated skin   Penicillins Hives    Has patient had a PCN reaction causing immediate rash, facial/tongue/throat swelling, SOB or lightheadedness with hypotension: No Has patient had a PCN reaction  causing severe rash involving mucus membranes or skin necrosis: No Has patient had a PCN reaction that required hospitalization: No Has patient had a PCN reaction occurring within the last 10 years: No  If all of the above answers are "NO", then may proceed with Cephalosporin use.     Pineapple Swelling and Other (See Comments)    Scratchy throat, Tongue swelling   Repatha [Evolocumab] Other (See Comments)    Dizzy, scratchy eyes/throat, palpitations   Sulfa Antibiotics Hives     HOME MEDICATIONS: Outpatient Medications Prior to Visit  Medication Sig Dispense Refill   albuterol (VENTOLIN HFA) 108 (90 Base) MCG/ACT inhaler TAKE 2 PUFFS BY MOUTH EVERY 6 HOURS AS NEEDED FOR WHEEZE OR SHORTNESS OF BREATH 8.5 each 1   Ascorbic Acid (VITAMIN C) 1000 MG tablet Take 1,000 mg by mouth at bedtime.     aspirin EC 81 MG tablet Take 81 mg by mouth every morning. Swallow whole.     atorvastatin (LIPITOR) 40 MG tablet Take 1 tablet (40 mg total) by mouth daily. 90 tablet 1   Blood Glucose Monitoring Suppl (ACCU-CHEK AVIVA PLUS) w/Device KIT Use as directed to check blood sugar once daily. E11.9 1 kit 0   Blood Pressure Monitoring (BLOOD PRESSURE KIT) DEVI 1 application by Does not apply route daily. Use to check blood pressure daily 1 Device 0   cholecalciferol (VITAMIN D3) 25 MCG (1000 UNIT) tablet Take 1,000 Units by mouth at bedtime.     COD LIVER OIL PO Take 1-2 capsules by mouth See admin instructions. Take one capsule by mouth every morning and take two capsules at night     EMGALITY 120 MG/ML SOAJ Inject 120 ml monthly. 1 mL 11   escitalopram (LEXAPRO) 10 MG tablet Take 1 tablet (10 mg total) by mouth daily. (Patient taking differently: Take 10 mg by mouth every morning.) 90 tablet 3   fluticasone (FLONASE) 50 MCG/ACT nasal spray PLACE 1 SPRAY INTO BOTH NOSTRILS DAILY AS NEEDED FOR ALLERGIES OR RHINITIS. 48 mL 3   GARLIC PO Take 1 tablet by mouth at bedtime.     glucose blood (ACCU-CHEK AVIVA  PLUS) test strip Use as instructed to check blood sugar once daily. E11.9 100 each 12   Lancets (ACCU-CHEK SOFT TOUCH) lancets Use as instructed to check blood sugar once daily E11.9 100 each 12   losartan (COZAAR) 25 MG tablet Take 1 tablet (25 mg  total) by mouth daily. 90 tablet 3   Misc Natural Products (CRANBERRY/PROBIOTIC PO) Take 2 capsules by mouth every morning.     Multiple Vitamin (MULTIVITAMIN WITH MINERALS) TABS tablet Take 1 tablet by mouth at bedtime.     Omega-3 Fatty Acids (FISH OIL PO) Take 1 capsule by mouth at bedtime.     OVER THE COUNTER MEDICATION Take 1 capsule by mouth at bedtime. Mother's nature for menopause     pantoprazole (PROTONIX) 40 MG tablet Take 40 mg by mouth every morning.     pregabalin (LYRICA) 50 MG capsule 50 mg daily.     Rimegepant Sulfate (NURTEC) 75 MG TBDP Take 1 tablet (75 mg total) by mouth daily as needed. For migraines. Take as close to onset of migraine as possible. One daily maximum. 10 tablet 0   Rimegepant Sulfate (NURTEC) 75 MG TBDP Take 1 tablet (75 mg total) by mouth daily as needed. For migraines. Take as close to onset of migraine as possible. One daily maximum. 16 tablet 11   SYMBICORT 160-4.5 MCG/ACT inhaler TAKE 2 PUFFS BY MOUTH TWICE A DAY (Patient taking differently: 2 puffs 2 (two) times daily as needed (shortness of breath/wheezing).) 30.6 each 3   topiramate (TOPAMAX) 50 MG tablet Take 1 tablet (50 mg total) by mouth in the morning AND 2 tablets (100 mg total) at bedtime. 270 tablet 3   zolpidem (AMBIEN) 10 MG tablet Take 10 mg by mouth at bedtime.     amLODipine (NORVASC) 5 MG tablet TAKE 1 TABLET (5 MG TOTAL) BY MOUTH DAILY. 90 tablet 3   meclizine (ANTIVERT) 25 MG tablet Take 1 tablet (25 mg total) by mouth 3 (three) times daily. 180 tablet 3   No facility-administered medications prior to visit.     PAST MEDICAL HISTORY: Past Medical History:  Diagnosis Date   Anxiety 05/18/2018   Asthma    Chronic anemia 06/29/2016    Chronic pain of both knees 02/15/2019   CKD stage 3 due to type 2 diabetes mellitus ([place]) 05/18/2018   Coronary artery disease 10/01/2018   Diabetes mellitus without complication ([place])    Dyslipidemia associated with type 2 diabetes mellitus ([place]) 05/18/2018   Lab Results Component Value Date  CHOL 198 08/17/2018  HDL 62.40 08/17/2018  LDLCALC 113 (H) 08/17/2018  TRIG 113.0 08/17/2018  CHOLHDL 3 08/17/2018     Gastroesophageal reflux disease without esophagitis 06/18/2015   Herniated intervertebral disc of lumbar spine 05/01/2017   Hypertension    Hypertension associated with diabetes ([place]) 05/13/2015   IBS (irritable bowel syndrome)    Insomnia 05/13/2015   Migraine headache 05/13/2015   Moderate episode of recurrent major depressive disorder ([place]) 01/04/2017   Osteoarthritis of spine 02/03/2017   Transient ischemic attack (TIA) 06/28/2016   Type 2 diabetes mellitus with neurological complications ([place]) [id number]   Lab Results Component Value Date  HGBA1C 5.9 08/17/2018  HGBA1C 5.8 01/04/2017  HGBA1C 6.4 (H) 06/29/2016  Lab Results Component Value Date  MICROALBUR 10.3 (H) 08/17/2018  LDLCALC 113 (H) 08/17/2018  CREATININE 1.56 (H) 08/17/2018       PAST SURGICAL HISTORY: Past Surgical History:  Procedure Laterality Date   CESAREAN SECTION     EXPLORATORY LAPAROTOMY       FAMILY HISTORY: Family History  Problem Relation Age of Onset   Kidney failure Mother    Hypertension Mother    Heart disease Father    Cancer Maternal Grandfather    Migraines Other    Breast cancer  Neg Hx      SOCIAL HISTORY: Social History   Socioeconomic History   Marital status: Divorced    Spouse name: Not on file   Number of children: Not on file   Years of education: Not on file   Highest education level: Not on file  Occupational History   Not on file  Tobacco Use   Smoking status: Former    Types: Cigarettes    Quit date: 09/22/2012    Years since quitting: 10.1   Smokeless tobacco: Never   Vaping Use   Vaping Use: Never used  Substance and Sexual Activity   Alcohol use: Yes    Alcohol/week: 1.0 standard drink of alcohol    Types: 1 Glasses of wine per week    Comment: red wine 1-2 x month   Drug use: No   Sexual activity: Not Currently  Other Topics Concern   Not on file  Social History Narrative   Lives with daughter Kayman Rubottom)    Right handed   Caffeine: none    Social Determinants of Health   Financial Resource Strain: Low Risk  (05/12/2022)   Overall Financial Resource Strain (CARDIA)    Difficulty of Paying Living Expenses: Not hard at all  Food Insecurity: No Food Insecurity (05/12/2022)   Hunger Vital Sign    Worried About Running Out of Food in the Last Year: Never true    Ran Out of Food in the Last Year: Never true  Transportation Needs: No Transportation Needs (05/12/2022)   PRAPARE - Hydrologist (Medical): No    Lack of Transportation (Non-Medical): No  Physical Activity: Insufficiently Active (05/12/2022)   Exercise Vital Sign    Days of Exercise per Week: 3 days    Minutes of Exercise per Session: 40 min  Stress: No Stress Concern Present (05/12/2022)   [place]    Feeling of Stress : Only a little  Social Connections: Moderately Integrated (05/12/2022)   Social Connection and Isolation Panel [NHANES]    Frequency of Communication with Friends and Family: More than three times a week    Frequency of Social Gatherings with Friends and Family: More than three times a week    Attends Religious Services: More than 4 times per year    Active Member of Clubs or Organizations: Yes    Attends Archivist Meetings: More than 4 times per year    Marital Status: Divorced  Intimate Partner Violence: Not At Risk (05/12/2022)   Humiliation, Afraid, Rape, and Kick questionnaire    Fear of Current or Ex-Partner: No    Emotionally Abused: No     Physically Abused: No    Sexually Abused: No     PHYSICAL EXAM  Vitals:   11/28/22 1130 11/28/22 1131 11/28/22 1150 11/28/22 1206  BP: (!) 151/101 (!) 173/101 (!) 160/120 (!) 168/94  Pulse: 90 62  (!) 59  Weight:      Height:         Body mass index is 25.53 kg/m.  Exam: NAD, pleasant                  Speech:    Speech is normal; fluent and spontaneous with normal comprehension.  Cognition:    The patient is oriented to person, place, and time;     recent and remote memory intact;     language fluent;    Cranial Nerves:  The pupils are equal, round, and reactive to light.Trigeminal sensation is intact and the muscles of mastication are normal. The face is symmetric. The palate elevates in the midline. Hearing intact. Voice is normal. Shoulder shrug is normal. The tongue has normal motion without fasciculations.   Coordination:  No dysmetria  Motor Observation:    No asymmetry, no atrophy, and no involuntary movements noted. Tone:    Normal muscle tone.     Strength:    Strength is symmerical in the upper and lower limbs.      Sensation: intact to LT  Gait, careful, slowed, slightly stooped      DIAGNOSTIC DATA (LABS, IMAGING, TESTING) - I reviewed patient records, labs, notes, testing and imaging myself where available.  Lab Results  Component Value Date   WBC 6.1 11/03/2022   HGB 9.4 (L) 11/03/2022   HCT 29.5 (L) 11/03/2022   MCV 90.5 11/03/2022   PLT 91 (L) 11/03/2022      Component Value Date/Time   NA 134 (L) 11/03/2022 0122   NA 140 10/04/2021 1101   K 5.0 11/03/2022 0122   CL 106 11/03/2022 0122   CO2 18 (L) 11/03/2022 0122   GLUCOSE 101 (H) 11/03/2022 0122   BUN 18 11/03/2022 0122   BUN 23 10/04/2021 1101   CREATININE 1.82 (H) 11/03/2022 0122   CREATININE 1.71 (H) 07/05/2018 1231   CREATININE 1.10 (H) 01/09/2017 1130   CALCIUM 8.4 (L) 11/03/2022 0122   PROT 7.7 02/16/2022 2108   PROT 6.8 09/21/2021 0803   ALBUMIN 4.6 02/16/2022 2108    ALBUMIN 4.4 09/21/2021 0803   AST 21 02/16/2022 2108   AST 20 07/05/2018 1231   ALT 15 02/16/2022 2108   ALT 12 07/05/2018 1231   ALKPHOS 48 02/16/2022 2108   BILITOT 0.7 02/16/2022 2108   BILITOT 0.3 09/21/2021 0803   BILITOT 0.7 07/05/2018 1231   GFRNONAA 30 (L) 11/03/2022 0122   GFRNONAA 30 (L) 07/05/2018 1231   GFRNONAA 54 (L) 01/09/2017 1130   GFRAA 39 (L) 12/06/2019 1045   GFRAA 35 (L) 07/05/2018 1231   GFRAA 62 01/09/2017 1130   Lab Results  Component Value Date   CHOL 196 09/21/2021   HDL 69 09/21/2021   LDLCALC 111 (H) 09/21/2021   TRIG 92 09/21/2021   CHOLHDL 2.8 09/21/2021   Lab Results  Component Value Date   HGBA1C 6.1 (H) 02/17/2022   Lab Results  Component Value Date   VITAMINB12 >1550 (H) 02/11/2021   Lab Results  Component Value Date   TSH 2.48 02/11/2021        No data to display               No data to display           ASSESSMENT AND PLAN  72 y.o. year old female  has a past medical history of Anxiety (05/18/2018), Asthma, Chronic anemia (06/29/2016), Chronic pain of both knees (02/15/2019), CKD stage 3 due to type 2 diabetes mellitus ([place]) (05/18/2018), Coronary artery disease (10/01/2018), Diabetes mellitus without complication ([place]), Dyslipidemia associated with type 2 diabetes mellitus ([place]) (05/18/2018), Gastroesophageal reflux disease without esophagitis (06/18/2015), Herniated intervertebral disc of lumbar spine (05/01/2017), Hypertension, Hypertension associated with diabetes ([place]) (05/13/2015), IBS (irritable bowel syndrome), Insomnia (05/13/2015), Migraine headache (05/13/2015), Moderate episode of recurrent major depressive disorder ([place]) (01/04/2017), Osteoarthritis of spine (02/03/2017), Transient ischemic attack (TIA) (06/28/2016), and Type 2 diabetes mellitus with neurological complications ([place]) ([id number]). here with   - Continue emgality and  topamax for prevention, usually well controlled with only 4 migraines a month and < 8 total ha  days a month, triptans contraindicated due to stroke but has tried iitrex and maxalt in the past.  - Acute/emergency: try nurtec. Ubrelvy did not help. Take at onset of migraines. If having breakthrough the last week prior to emgality take one every day until your shot. Take shot as soon as available.   - had TBI, CT negative. Eyes "rolled back in the head" per daughter she had to give cpr. She sees [facility] surgeon, BP was hypotensive may have been vasovagal or orthostatic. will check for stroke, seizures and emailed tiffany Littleton MD for cardiac workup as clinically warranted by her  Neurology - MRi brain with a seizure protocol and stroke protocol, MRI cervical spine, EEG here in the office and 3-day prolonged eeg if neg, MRA brain Pulse is 53 - discuss with Dr. Oval Linsey.  PT for vestibular rehab vertigo   Meds ordered this encounter  Medications   scopolamine (TRANSDERM-SCOP) 1 MG/3DAYS    Sig: Place 1 patch (1.5 mg total) onto the skin every 3 (three) days.    Dispense:  10 patch    Refill:  0   ondansetron (ZOFRAN-ODT) 4 MG disintegrating tablet    Sig: Take 1-2 tablets (4-8 mg total) by mouth every 8 (eight) hours as needed.    Dispense:  30 tablet    Refill:  3   meclizine (ANTIVERT) 25 MG tablet    Sig: Take 1 tablet (25 mg total) by mouth 3 (three) times daily.    Dispense:  180 tablet    Refill:  3   Orders Placed This Encounter  Procedures   MR BRAIN W WO CONTRAST   MR ANGIO HEAD WO CONTRAST   MR CERVICAL SPINE WO CONTRAST   TSH Rfx on Abnormal to Free T4   TSH Rfx on Abnormal to Free T4   Ambulatory referral to Physical Therapy   EEG adult     [facility] [address] [place], [place] [address] [phone]  I spent over 45 minutes of face-to-face and non-face-to-face time with patient on the  1. Vertigo   2. Post-concussion vertigo   3. Dizziness   4. Benign paroxysmal positional vertigo,  unspecified laterality   5. Syncope, unspecified syncope type   6. Seizure-like activity ([place])   7. Loss of consciousness ([place])   8. Chronic migraine without aura without status migrainosus, not intractable   9. Traumatic brain injury, with loss of consciousness of 30 minutes or less, initial encounter ([place])   10. Ataxia   11. Fall, initial encounter     diagnosis.  This included previsit chart review, lab review, study review, order entry, electronic health record documentation, patient education on the different diagnostic and therapeutic options, counseling and coordination of care, risks and benefits of management, compliance, or risk factor reduction

## 2022-11-29 ENCOUNTER — Ambulatory Visit (HOSPITAL_BASED_OUTPATIENT_CLINIC_OR_DEPARTMENT_OTHER): Payer: Medicare Other | Admitting: Cardiovascular Disease

## 2022-11-29 ENCOUNTER — Encounter (HOSPITAL_BASED_OUTPATIENT_CLINIC_OR_DEPARTMENT_OTHER): Payer: Self-pay | Admitting: Cardiovascular Disease

## 2022-11-29 ENCOUNTER — Encounter: Payer: Self-pay | Admitting: Neurology

## 2022-11-29 ENCOUNTER — Telehealth: Payer: Self-pay | Admitting: Neurology

## 2022-11-29 ENCOUNTER — Telehealth: Payer: Self-pay | Admitting: Cardiovascular Disease

## 2022-11-29 VITALS — BP 138/94 | HR 60 | Ht 67.0 in | Wt 163.0 lb

## 2022-11-29 DIAGNOSIS — I152 Hypertension secondary to endocrine disorders: Secondary | ICD-10-CM

## 2022-11-29 DIAGNOSIS — E1159 Type 2 diabetes mellitus with other circulatory complications: Secondary | ICD-10-CM

## 2022-11-29 DIAGNOSIS — G43101 Migraine with aura, not intractable, with status migrainosus: Secondary | ICD-10-CM

## 2022-11-29 DIAGNOSIS — I2510 Atherosclerotic heart disease of native coronary artery without angina pectoris: Secondary | ICD-10-CM | POA: Diagnosis not present

## 2022-11-29 DIAGNOSIS — R55 Syncope and collapse: Secondary | ICD-10-CM | POA: Diagnosis not present

## 2022-11-29 LAB — TSH RFX ON ABNORMAL TO FREE T4: TSH: 1.55 u[IU]/mL (ref 0.450–4.500)

## 2022-11-29 MED ORDER — NURTEC 75 MG PO TBDP: 75.0000 mg | ORAL_TABLET | Freq: Every day | ORAL | 11 refills | Status: DC | PRN

## 2022-11-29 NOTE — Telephone Encounter (Signed)
Pt c/o medication issue:  1. Name of Medication:  Amlodipine  2. How are you currently taking this medication (dosage and times per day)?   3. Are you having a reaction (difficulty breathing--STAT)?   4. What is your medication issue?   Patient's daughter states Amlodipine was discontinued by Dr. Oval Linsey but when she went to the pharmacy for refills they had a prescription for it. Patient's daughter would like to know if this is a mistake. She states she previously informed the pharmacy that the patient would no longer be taking Amlodipine when she switched pharmacies. She went ahead and picked it up but she will put it aside until she receives feedback from Dr. Haynes Dage.

## 2022-11-29 NOTE — Assessment & Plan Note (Signed)
Nonobstructive CAD.  She has no ischemic symptoms.  Continue aspirin and atorvastatin.

## 2022-11-29 NOTE — Assessment & Plan Note (Signed)
Blood pressure is well-controlled at home.  She has white coat hypertension superimposed on essential hypertension.  Continue losartan.

## 2022-11-29 NOTE — Telephone Encounter (Signed)
No longer to be taking Amlodipine per chart review. Unfortunately it alerts pharmacy when we start a new medicine but not when we stop the medicine unless it is a [employer].  Loel Dubonnet, NP

## 2022-11-29 NOTE — Patient Instructions (Signed)
Medication Instructions:  Your physician recommends that you continue on your current medications as directed. Please refer to the Current Medication list given to you today.   *If you need a refill on your cardiac medications before your next appointment, please call your pharmacy*  Lab Work: NONE  Testing/Procedures:  Your physician has requested that you have an echocardiogram. Echocardiography is a painless test that uses sound waves to create images of your heart. It provides your doctor with information about the size and shape of your heart and how well your heart's chambers and valves are working. This procedure takes approximately one hour. There are no restrictions for this procedure. Please do NOT wear cologne, perfume, aftershave, or lotions (deodorant is allowed). Please arrive 15 minutes prior to your appointment time.  Your physician has requested that you have a carotid duplex. This test is an ultrasound of the carotid arteries in your neck. It looks at blood flow through these arteries that supply the brain with blood. Allow one hour for this exam. There are no restrictions or special instructions.  Follow-Up: At [facility], you and your health needs are our priority.  As part of our continuing mission to provide you with exceptional heart care, we have created designated Provider Care Teams.  These Care Teams include your primary Cardiologist (physician) and Advanced Practice Providers (APPs -  Physician Assistants and Nurse Practitioners) who all work together to provide you with the care you need, when you need it.  We recommend signing up for the patient portal called "MyChart".  Sign up information is provided on this After Visit Summary.  MyChart is used to connect with patients for Virtual Visits (Telemedicine).  Patients are able to view lab/test results, encounter notes, upcoming appointments, etc.  Non-urgent messages can be sent to your provider as well.   To  learn more about what you can do with MyChart, go to [email or web address].    Your next appointment:   3 month(s)  Provider:   Skeet Latch, MD or Laurann Montana, NP    You have been referred to ELECTROPHYSIOLOGY

## 2022-11-29 NOTE — Assessment & Plan Note (Signed)
Julie Gilbert was recently seen in the ED with syncope.  She has had multiple episodes of syncope.  There is no warning and she was otherwise feeling well.  She reports that she was eating and drinking well.  Her daughter notes that her blood pressure was low with EMS and that she received IV fluids.  She was not hypotensive in the ED.  Her see our electrophysiologist for an implantable loop recorder.  She reports having severe allergies to latex and tape.  She is also worn a monitor in the past.  Episodes are so sporadic that I think a loop recorder but would be more helpful.  We will also check an echo and carotid Dopplers.  She continues following up with our neurology colleagues to rule out neurologic causes.

## 2022-11-29 NOTE — Progress Notes (Signed)
Cardiology Office Note   Date:  12/23/2022   ID:  Julie Gilbert, Julie Gilbert 10-Jul-1951, MRN [id number]  PCP:  Sandi Mariscal, MD  Cardiologist:   Skeet Latch, MD   No chief complaint on file.   History of Present Illness: Julie Gilbert is a 72 y.o. female with non-obstructive CAD, hypertension, diabetes, TIA, hyperlipidemia, and CKD 3 who presents for follow-up.  She transferred to [facility] from [place] in the last couple years.  She was previously on amlodipine and a statin but both were discontinued due to intolerance.  She saw Dr. Tamala Julian 07/2021.  It was noted that her lipids were extremely elevated with LDLs over 200.  She had been previously on atorvastatin and this was discontinued.  She was tried on rosuvastatin and referred to the Pharm.D. for consideration of PCSK9 inhibitor.  She was tolerating rosuvastatin at the time.  She was also started on a PCSK9 inhibitor.  She tried Repatha but was unable to tolerate it due to flu-like symptoms.  Given her worsening renal function she was asked to stop taking diclofenac and was referred to nephrology.  She is struggling with stressful home environment.  Julie Gilbert had a stress test 01/2018 that showed anteroseptal perfusion defect.  She underwent left heart cath and was found to have 25 to 30% distal left main disease and 50% mid RCA.  There was minimal disease in the LAD and left circumflex.  At her last visit her blood pressure was elevated in office, but she felt it was controlled at home so no changes were made. She had atypical chest pain and was referred for coronary CT, has not been performed. Started on nexlizet. Today, she says she is doing better than she was yesterday. About a week or two ago she had a fall and has not been feeling good since then. She is accompanied by a family member who said she did not witness the fall, but found her unconscious on the floor and could not find a pulse at first so she started CPR. She woke up while  this was happening but she does not remember the fall. After she was unable to stand to get on the stretcher and had to be lifted onto it.The EMT reported that her blood pressure was 98/60 so they started fluids to support her. Her blood pressure was in the 140s once she was in the ED. She confirmed that she has not been taking the amlodipine. Her head where she hit it has been sore, especially when she has a migraine. The vertigo also makes it worse. She feels that her speech has been "jagged" since she had the fall. It has been a long time since she had a syncope episode like this one. She says the last one was when she lived in [place], which was at least 7 years ago. At home her blood pressure has been normal, 130/85 or so. She does have white coat syndrome. The vertigo and migraines have been bad enough that she has not been able to exercise much. She is starting physical therapy next week. She denies any palpitations, chest pain, shortness of breath, or peripheral edema. No orthopnea, or PND.    Past Medical History:  Diagnosis Date   Anxiety 05/18/2018   Asthma    Chronic anemia 06/29/2016   Chronic pain of both knees 02/15/2019   CKD stage 3 due to type 2 diabetes mellitus ([place]) 05/18/2018   Coronary artery disease 10/01/2018   Diabetes  mellitus without complication ([place])    Dyslipidemia associated with type 2 diabetes mellitus ([place]) 05/18/2018   Lab Results Component Value Date  CHOL 198 08/17/2018  HDL 62.40 08/17/2018  LDLCALC 113 (H) 08/17/2018  TRIG 113.0 08/17/2018  CHOLHDL 3 08/17/2018     Gastroesophageal reflux disease without esophagitis 06/18/2015   Herniated intervertebral disc of lumbar spine 05/01/2017   Hypertension    Hypertension associated with diabetes ([place]) 05/13/2015   IBS (irritable bowel syndrome)    Insomnia 05/13/2015   Migraine headache 05/13/2015   Moderate episode of recurrent major depressive disorder ([place]) 01/04/2017   Osteoarthritis of spine 02/03/2017   Transient  ischemic attack (TIA) 06/28/2016   Type 2 diabetes mellitus with neurological complications ([place]) [id number]   Lab Results Component Value Date  HGBA1C 5.9 08/17/2018  HGBA1C 5.8 01/04/2017  HGBA1C 6.4 (H) 06/29/2016  Lab Results Component Value Date  MICROALBUR 10.3 (H) 08/17/2018  LDLCALC 113 (H) 08/17/2018  CREATININE 1.56 (H) 08/17/2018      Past Surgical History:  Procedure Laterality Date   CESAREAN SECTION     EXPLORATORY LAPAROTOMY       Current Outpatient Medications  Medication Sig Dispense Refill   Albuterol-Budesonide 90-80 MCG/ACT AERO Inhale 2 puffs into the lungs daily.     Ascorbic Acid (VITAMIN C) 1000 MG tablet Take 1,000 mg by mouth at bedtime.     aspirin EC 81 MG tablet Take 81 mg by mouth every morning. Swallow whole.     atorvastatin (LIPITOR) 40 MG tablet Take 1 tablet (40 mg total) by mouth daily. 90 tablet 1   Blood Glucose Monitoring Suppl (ACCU-CHEK AVIVA PLUS) w/Device KIT Use as directed to check blood sugar once daily. E11.9 1 kit 0   Blood Pressure Monitoring (BLOOD PRESSURE KIT) DEVI 1 application by Does not apply route daily. Use to check blood pressure daily 1 Device 0   cholecalciferol (VITAMIN D3) 25 MCG (1000 UNIT) tablet Take 1,000 Units by mouth at bedtime.     COD LIVER OIL PO Take 1-2 capsules by mouth See admin instructions. Take one capsule by mouth every morning and take two capsules at night     EMGALITY 120 MG/ML SOAJ Inject 120 ml monthly. 1 mL 11   escitalopram (LEXAPRO) 20 MG tablet Take 20 mg by mouth daily.     fluticasone (FLONASE) 50 MCG/ACT nasal spray PLACE 1 SPRAY INTO BOTH NOSTRILS DAILY AS NEEDED FOR ALLERGIES OR RHINITIS. 48 mL 3   GARLIC PO Take 1 tablet by mouth at bedtime.     glucose blood (ACCU-CHEK AVIVA PLUS) test strip Use as instructed to check blood sugar once daily. E11.9 100 each 12   Lancets (ACCU-CHEK SOFT TOUCH) lancets Use as instructed to check blood sugar once daily E11.9 100 each 12   losartan (COZAAR) 25 MG  tablet Take 1 tablet (25 mg total) by mouth daily. 90 tablet 3   meclizine (ANTIVERT) 25 MG tablet Take 1 tablet (25 mg total) by mouth 3 (three) times daily. 180 tablet 3   Multiple Vitamin (MULTIVITAMIN WITH MINERALS) TABS tablet Take 1 tablet by mouth at bedtime.     Omega-3 Fatty Acids (FISH OIL PO) Take 1 capsule by mouth at bedtime.     ondansetron (ZOFRAN-ODT) 4 MG disintegrating tablet Take 1-2 tablets (4-8 mg total) by mouth every 8 (eight) hours as needed. 30 tablet 3   pantoprazole (PROTONIX) 40 MG tablet Take 40 mg by mouth every morning.     pregabalin (  LYRICA) 50 MG capsule 50 mg daily.     scopolamine (TRANSDERM-SCOP) 1 MG/3DAYS Place 1 patch (1.5 mg total) onto the skin every 3 (three) days. 10 patch 0   topiramate (TOPAMAX) 50 MG tablet Take 1 tablet (50 mg total) by mouth in the morning AND 2 tablets (100 mg total) at bedtime. 270 tablet 3   zolpidem (AMBIEN) 10 MG tablet Take 10 mg by mouth at bedtime.     Rimegepant Sulfate (NURTEC) 75 MG TBDP Take 1 tablet (75 mg total) by mouth daily as needed. For migraines. Take as close to onset of migraine as possible. One daily maximum. 16 tablet 11   No current facility-administered medications for this visit.    Allergies:   Peanuts [peanut oil], Shrimp [shellfish allergy], Latex, Elemental sulfur, Iodinated contrast media, Other, Penicillins, Pineapple, Repatha [evolocumab], and Sulfa antibiotics    Social History:  The patient  reports that she quit smoking about 10 years ago. Her smoking use included cigarettes. She has never used smokeless tobacco. She reports current alcohol use of about 1.0 standard drink of alcohol per week. She reports that she does not use drugs.   Family History:  The patient's family history includes Cancer in her maternal grandfather; Heart disease in her father; Hypertension in her mother; Kidney failure in her mother; Migraines in an other family member.    ROS:   Please see the history of present  illness. (+) migraines (+) vertigo (+) syncope All other systems are reviewed and negative.     PHYSICAL EXAM: VS:  BP (!) 138/94   Pulse 60   Ht 5\' 7"  (1.702 m)   Wt 163 lb (73.9 kg)   LMP  (LMP Unknown) Comment: Menopausal  BMI 25.53 kg/m  , BMI Body mass index is 25.53 kg/m. GENERAL:  Well appearing HEENT:  Pupils equal round and reactive, fundi not visualized, oral mucosa unremarkable NECK:  No jugular venous distention, waveform within normal limits, carotid upstroke brisk and symmetric, no bruits, no thyromegaly LUNGS:  Clear to auscultation bilaterally HEART:  RRR.  PMI not displaced or sustained,S1 and S2 within normal limits, no S3, no S4, no clicks, no rubs, no murmurs ABD:  Flat, positive bowel sounds normal in frequency in pitch, no bruits, no rebound, no guarding, no midline pulsatile mass, no hepatomegaly, no splenomegaly EXT:  2 plus pulses throughout, no edema, no cyanosis no clubbing SKIN:  No rashes no nodules NEURO:  Cranial nerves II through XII grossly intact, motor grossly intact throughout PSYCH:  Cognitively intact, oriented to person place and time   EKG:  EKG is personally reviewed.  11/29/2022: EKG was not ordered.  EKG is ordered today. The ekg ordered today demonstrates sinus bradycardia.  Rate 52 bpm.  LVH.  Anterolateral and inferior TWI.   Recent Labs: 02/16/2022: ALT 15 11/03/2022: BUN 18; Creatinine, Ser 1.82; Hemoglobin 9.4; Platelets 91; Potassium 5.0; Sodium 134 11/28/2022: TSH 1.550    Lipid Panel    Component Value Date/Time   CHOL 196 09/21/2021 0803   TRIG 92 09/21/2021 0803   HDL 69 09/21/2021 0803   CHOLHDL 2.8 09/21/2021 0803   CHOLHDL 5 02/11/2021 1459   VLDL 37.8 02/11/2021 1459   LDLCALC 111 (H) 09/21/2021 0803      Wt Readings from Last 3 Encounters:  11/29/22 163 lb (73.9 kg)  11/28/22 163 lb (73.9 kg)  11/03/22 162 lb (73.5 kg)      ASSESSMENT AND PLAN:  Hypertension associated with diabetes ([place])  Blood pressure  is well-controlled at home.  She has white coat hypertension superimposed on essential hypertension.  Continue losartan.  Syncope and collapse Ms. Skurka was recently seen in the ED with syncope.  She has had multiple episodes of syncope.  There is no warning and she was otherwise feeling well.  She reports that she was eating and drinking well.  Her daughter notes that her blood pressure was low with EMS and that she received IV fluids.  She was not hypotensive in the ED.  Her see our electrophysiologist for an implantable loop recorder.  She reports having severe allergies to latex and tape.  She is also worn a monitor in the past.  Episodes are so sporadic that I think a loop recorder but would be more helpful.  We will also check an echo and carotid Dopplers.  She continues following up with our neurology colleagues to rule out neurologic causes.  Coronary artery disease Nonobstructive CAD.  She has no ischemic symptoms.  Continue aspirin and atorvastatin.   Current medicines are reviewed at length with the patient today.  The patient does not have concerns regarding medicines.  The following changes have been made:  none  Labs/ tests ordered today include:   Orders Placed This Encounter  Procedures   Ambulatory referral to Cardiac Electrophysiology   ECHOCARDIOGRAM COMPLETE   VAS US CAROTID     Disposition:   FU with Shanika Levings C. Oval Linsey, MD, [facility] in 3 months.    Signed, Malakhi Markwood C. Oval Linsey, MD, [facility]  12/23/2022 3:54 PM    [place]

## 2022-11-29 NOTE — Telephone Encounter (Signed)
[employer] medicare NPR sent to GI [phone]

## 2022-12-02 ENCOUNTER — Ambulatory Visit
Admission: RE | Admit: 2022-12-02 | Discharge: 2022-12-02 | Disposition: A | Discharge status: Home / Self Care | Payer: Medicare Other | Source: Ambulatory Visit | Attending: Obstetrics and Gynecology | Admitting: Obstetrics and Gynecology

## 2022-12-02 DIAGNOSIS — N6489 Other specified disorders of breast: Secondary | ICD-10-CM

## 2022-12-08 ENCOUNTER — Other Ambulatory Visit: Payer: Self-pay | Admitting: Obstetrics and Gynecology

## 2022-12-08 ENCOUNTER — Ambulatory Visit (INDEPENDENT_AMBULATORY_CARE_PROVIDER_SITE_OTHER): Payer: Medicare Other | Admitting: Neurology

## 2022-12-08 DIAGNOSIS — R4020 Unspecified coma: Secondary | ICD-10-CM

## 2022-12-08 DIAGNOSIS — W19XXXA Unspecified fall, initial encounter: Secondary | ICD-10-CM

## 2022-12-08 DIAGNOSIS — R4182 Altered mental status, unspecified: Secondary | ICD-10-CM | POA: Diagnosis not present

## 2022-12-08 DIAGNOSIS — N6489 Other specified disorders of breast: Secondary | ICD-10-CM

## 2022-12-08 DIAGNOSIS — R569 Unspecified convulsions: Secondary | ICD-10-CM

## 2022-12-08 DIAGNOSIS — R55 Syncope and collapse: Secondary | ICD-10-CM

## 2022-12-08 DIAGNOSIS — R42 Dizziness and giddiness: Secondary | ICD-10-CM

## 2022-12-08 DIAGNOSIS — S069X1A Unspecified intracranial injury with loss of consciousness of 30 minutes or less, initial encounter: Secondary | ICD-10-CM

## 2022-12-08 DIAGNOSIS — R270 Ataxia, unspecified: Secondary | ICD-10-CM

## 2022-12-08 NOTE — Procedures (Signed)
    History:  72 year old woman with seizure like activity v  EEG classification: Awake and drowsy  Description of the recording: The background rhythms of this recording consists of a fairly well modulated medium amplitude alpha rhythm of 10 Hz that is reactive to eye opening and closure. Present in the anterior head region is a 15-20 Hz beta activity. Photic stimulation was performed, did not show any abnormalities. Hyperventilation was also performed, did not show any abnormalities. Drowsiness was manifested by background fragmentation. No abnormal epileptiform discharges seen during this recording. There was no focal slowing but there was one second of synchronous theta activity. There were no electrographic seizure identified.   Abnormality: None   Impression: This is an essentially normal EEG recorded while drowsy and awake. No evidence of interictal epileptiform discharges. Normal EEGs, however, do not rule out epilepsy.    Alric Ran, MD [facility]

## 2022-12-19 ENCOUNTER — Telehealth: Payer: Self-pay | Admitting: *Deleted

## 2022-12-19 ENCOUNTER — Ambulatory Visit: Payer: 59 | Admitting: Neurology

## 2022-12-19 ENCOUNTER — Encounter: Payer: Self-pay | Admitting: *Deleted

## 2022-12-19 NOTE — Telephone Encounter (Signed)
Called pt left LVM 3/19/2024SH,  LVM 12/14/2022 SH  Now sending mychart message for patient to call back for EEG results

## 2022-12-22 ENCOUNTER — Encounter: Payer: Self-pay | Admitting: Neurology

## 2022-12-22 ENCOUNTER — Encounter: Payer: Self-pay | Admitting: *Deleted

## 2022-12-22 NOTE — Telephone Encounter (Signed)
72 hour AMB EEG order form completed. Form signed by Dr Jaynee Eagles then faxed to [place] [phone]. Received a receipt of confirmation.

## 2022-12-22 NOTE — Telephone Encounter (Signed)
Tried to reach pt again, LVM (ok per DPR) asking for call back. EEG negative, will be ordering a 72 hour home EEG. Left office number in message. Tried to call pt's daughter Bertram Millard (on [place]). Received message that call could not be completed. Tried to call this number 3x.   At this point will go ahead and send referral for 3 day home EEG to [facility] and will also send letter to pt's home.  Letter mailed to pt.

## 2022-12-23 ENCOUNTER — Encounter (HOSPITAL_BASED_OUTPATIENT_CLINIC_OR_DEPARTMENT_OTHER): Payer: Self-pay | Admitting: Cardiovascular Disease

## 2022-12-26 ENCOUNTER — Other Ambulatory Visit: Payer: Self-pay | Admitting: Neurology

## 2022-12-26 DIAGNOSIS — R42 Dizziness and giddiness: Secondary | ICD-10-CM

## 2022-12-27 ENCOUNTER — Other Ambulatory Visit (HOSPITAL_BASED_OUTPATIENT_CLINIC_OR_DEPARTMENT_OTHER): Payer: Medicare Other

## 2022-12-28 ENCOUNTER — Ambulatory Visit (INDEPENDENT_AMBULATORY_CARE_PROVIDER_SITE_OTHER): Payer: Medicare Other

## 2022-12-28 ENCOUNTER — Other Ambulatory Visit: Payer: Self-pay | Admitting: Neurology

## 2022-12-28 DIAGNOSIS — E1159 Type 2 diabetes mellitus with other circulatory complications: Secondary | ICD-10-CM

## 2022-12-28 DIAGNOSIS — R42 Dizziness and giddiness: Secondary | ICD-10-CM

## 2022-12-28 DIAGNOSIS — R55 Syncope and collapse: Secondary | ICD-10-CM | POA: Diagnosis not present

## 2022-12-28 DIAGNOSIS — I152 Hypertension secondary to endocrine disorders: Secondary | ICD-10-CM

## 2022-12-28 LAB — ECHOCARDIOGRAM COMPLETE
Area-P 1/2: 2 cm2
P 1/2 time: 474 msec
S' Lateral: 3.35 cm

## 2022-12-28 MED ORDER — SCOPOLAMINE 1 MG/3DAYS TD PT72
1.0000 | MEDICATED_PATCH | TRANSDERMAL | 0 refills | Status: DC
Start: 2022-12-28 — End: 2023-03-23

## 2022-12-28 NOTE — Telephone Encounter (Signed)
I sent this to Dr. Jaynee Eagles if she still wanted to do refills.

## 2022-12-28 NOTE — Therapy (Signed)
OUTPATIENT PHYSICAL THERAPY VESTIBULAR EVALUATION     Patient Name: Julie Gilbert MRN: [id number] DOB:[birth year], 72 y.o., female Today's Date: 12/29/2022  END OF SESSION:  PT End of Session - 12/29/22 1135     Visit Number 1    Number of Visits 9    Date for PT Re-Evaluation 01/28/23    Authorization Type [employer] Medicare    PT Start Time 1019    PT Stop Time 1104    PT Time Calculation (min) 45 min    Activity Tolerance Patient tolerated treatment well   limited by dizziness, elevated BP   Behavior During Therapy WFL for tasks assessed/performed             Past Medical History:  Diagnosis Date   Anxiety 05/18/2018   Asthma    Chronic anemia 06/29/2016   Chronic pain of both knees 02/15/2019   CKD stage 3 due to type 2 diabetes mellitus 05/18/2018   Coronary artery disease 10/01/2018   Diabetes mellitus without complication    Dyslipidemia associated with type 2 diabetes mellitus 05/18/2018   Lab Results Component Value Date  CHOL 198 08/17/2018  HDL 62.40 08/17/2018  LDLCALC 113 (H) 08/17/2018  TRIG 113.0 08/17/2018  CHOLHDL 3 08/17/2018     Gastroesophageal reflux disease without esophagitis 06/18/2015   Herniated intervertebral disc of lumbar spine 05/01/2017   Hypertension    Hypertension associated with diabetes 05/13/2015   IBS (irritable bowel syndrome)    Insomnia 05/13/2015   Migraine headache 05/13/2015   Moderate episode of recurrent major depressive disorder 01/04/2017   Osteoarthritis of spine 02/03/2017   Transient ischemic attack (TIA) 06/28/2016   Type 2 diabetes mellitus with neurological complications [id number]   Lab Results Component Value Date  HGBA1C 5.9 08/17/2018  HGBA1C 5.8 01/04/2017  HGBA1C 6.4 (H) 06/29/2016  Lab Results Component Value Date  MICROALBUR 10.3 (H) 08/17/2018  LDLCALC 113 (H) 08/17/2018  CREATININE 1.56 (H) 08/17/2018     Past Surgical History:  Procedure Laterality Date   CESAREAN SECTION     EXPLORATORY LAPAROTOMY     Patient Active  Problem List   Diagnosis Date Noted   Syncope and collapse 11/28/2022   Chest pain 02/17/2022   AKI (acute kidney injury) 02/17/2022   Chronic migraine without aura without status migrainosus, not intractable 05/20/2021   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   Allergic rhinitis 02/11/2021   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   B12 deficiency 02/11/2020   Iron deficiency 02/11/2020   Inflamed seborrheic keratosis 01/30/2020   Asthma 06/27/2019   Chronic pain of both knees 02/15/2019   Coronary artery disease 10/01/2018   Dyslipidemia associated with type 2 diabetes mellitus 05/18/2018   Anxiety 05/18/2018   CKD stage 3 due to type 2 diabetes mellitus 05/18/2018   Chest pain syndrome 10/27/2017   Herniated intervertebral disc of lumbar spine 05/01/2017   Osteoarthritis of spine 02/03/2017   Moderate episode of recurrent major depressive disorder 01/04/2017   Chronic anemia 06/29/2016   Gastroesophageal reflux disease without esophagitis 06/18/2015   Hypertension associated with diabetes 05/13/2015   Type 2 diabetes mellitus with neurological complications [id number]   IBS (irritable bowel syndrome) 05/13/2015   Migraine headache 05/13/2015   Insomnia 05/13/2015    PCP: Sandi Mariscal, MD  REFERRING PROVIDER:  Melvenia Beam, MD  REFERRING DIAG: R42 (ICD-10-CM) - Vertigo   THERAPY DIAG:  Dizziness and giddiness  Unsteadiness on feet  History of falling  ONSET DATE: 11/28/2022 (date of referral)  Rationale for  Evaluation and Treatment: Rehabilitation  SUBJECTIVE:   SUBJECTIVE STATEMENT: Following up with cardiology next week after recent syncopal episode (see below for more information). Will be getting a heart monitor next week. Reports she has vertigo and she has it bad. Also has severe migraines, sometimes takes multiple medications a day for her migraines. Reports nothing works, follows up with Dr. Jaynee Eagles regarding this. Reports Dr. Jaynee Eagles feels like she has a  concussion after her fall from the beginning of February. Also had vertigo for a long time before her fall (has had it since she was 72 years old). When asked to describe vertigo - feels like her heart is racing and her head starts to spin really fast and then sees nothing. When turning to her L side in bed, can see things spinning and then needs to close her eyes. Whenever she moves or walks too fast, feels like something is pulling her to prevent her from moving too quickly. Went to 2 visits of PT before for vertigo in 2023, but never followed through with it. Uses her cane at all times. Takes Meclizine 3x a day. Reports dizziness is really bad when she doesn't take Meclizine, reports it is really bad on her L side and loses balance really bad, esp with looking up.   Pt accompanied by: self  PERTINENT HISTORY: PMH: Anxiety, Asthma, Chronic anemia, Chronic pain of both knees, CKD stage 3 due to type 2 diabetes mellitus, Coronary artery disease, Type 2 diabetes mellitus with neurological complications, GERD, Herniated intervertebral disc of lumbar spine, HTN, Migraines, Moderate episode of recurrent major depressive disorder, Osteoarthritis of spine,TIA (06/28/2016), Vertigo  Per PCP note on 11/29/22: About a week or two ago she had a fall and has not been feeling good since then. She is accompanied by a family member who said she did not witness the fall, but found her unconscious on the floor and could not find a pulse at first so she started CPR. She woke up while this was happening but she does not remember the fall. After she was unable to stand to get on the stretcher and had to be lifted onto it.The EMT reported that her blood pressure was 98/60 so they started fluids to support her. Her blood pressure was in the 140s once she was in the ED. She confirmed that she has not been taking the amlodipine. Her head where she hit it has been sore, especially when she has a migraine. The vertigo also makes it worse.  She feels that her speech has been "jagged" since she had the fall. It has been a long time since she had a syncope episode like this one. She says the last one was when she lived in [place], which was at least 7 years ago. Also using a scopolamine patch every 3 days which has been very helpful as well.    PAIN:  Are you having pain? No  Vitals:   12/29/22 1037 12/29/22 1059  BP: (!) 156/99 (!) 147/105  Pulse: 64 60   Per physician notes, pt gets white coat syndrome and pt's BP is normal at home. Pt asymptomatic. Pt reports she did not eat this morning and it is worse when she has vertigo.   PRECAUTIONS: Fall  WEIGHT BEARING RESTRICTIONS: No  FALLS: Has patient fallen in last 6 months? Yes. Number of falls 3  LIVING ENVIRONMENT: Lives with: lives with their daughter Lives in: House/apartment - [place]: No Has following equipment at home:  Single point cane  PLOF: Independent with household mobility with device and Independent with community mobility with device, no longer driving   PATIENT GOALS: Wants to be more stable   OBJECTIVE:   DIAGNOSTIC FINDINGS: CT head 11/03/22: IMPRESSION: 1. No acute intracranial pathology. 2. 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  POSTURE:  rounded shoulders  GAIT: Gait pattern: step through pattern, decreased stride length, and wide BOS, bilateral toe out  Distance walked: Clinic distances  Assistive device utilized: Single point cane Level of assistance: SBA   PATIENT SURVEYS:  FOTO DPS: 39, DFS: 48.3  VESTIBULAR ASSESSMENT:  GENERAL OBSERVATION: Ambulates in with cane, wears bifocals    SYMPTOM BEHAVIOR:  Subjective history: See above.   Non-Vestibular symptoms: neck pain, tinnitus, and migraine symptoms  Type of dizziness: Imbalance (Disequilibrium), Spinning/Vertigo, Unsteady with head/body turns, and Lightheadedness/Faint  Frequency: Often, unable to give an exact frequency.    Duration: "I've gotten so used to it idk, I just live with it"   Aggravating factors: Induced by position change: rolling to the left and sit to stand and Induced by motion: looking up at the ceiling, turning body quickly, turning head quickly, and sitting in a moving car  Relieving factors:  Meclizine   Progression of symptoms: worse  OCULOMOTOR EXAM:  Ocular Alignment: normal  Ocular ROM: No Limitations  Spontaneous Nystagmus: absent  Gaze-Induced Nystagmus: left beating with left gaze  Smooth Pursuits: intact,  dizziness throughout, worse when looking to the L, severe dizziness   Saccades: hypometric/undershoots and extra eye movements, feels like it takes a lot of extra work, mild dizziness throughout   VESTIBULAR - OCULAR REFLEX:   Slow VOR: Normal and Comment: saccades, mild dizziness, worse when going to L side   VOR Cancellation: Comment: difficulty maintaining focus, severe dizziness, feels like it gave her heart palpatations, worse going to the L.   PT assessing pt's pulse, and heart rate normal.   Head-Impulse Test: did not test today due severity of dizziness during VOR cancellation      POSITIONAL TESTING: Other: will assess at future sessions when BP is better controlled, time constraints during eval    VESTIBULAR TREATMENT:       N/a during eval                                                                                      PATIENT EDUCATION: Education details: Clinical findings, POC, BP parameters for therapy, discussed pt to monitor BP at home and if it is still elevated and pt demonstrating signs/sx of a CVA, then needs to go to ER with pt verbalizing understanding  Person educated: Patient Education method: Explanation Education comprehension: verbalized understanding  HOME EXERCISE PROGRAM:  GOALS: Goals reviewed with patient? Yes  SHORT TERM GOALS: ALL STGS = LTGS   LONG TERM GOALS: Target date: 01/26/2023  Pt will be independent with final  vestibular HEP with in order to build upon functional gains made in therapy. Baseline:  Goal status: INITIAL  2.  MSQ goal to be written as appropriate.  Baseline:  Goal status: INITIAL  3.  Pt will improve  DPS to at least a 54 in order to demo improved functional status with vertigo. Baseline:  Goal status: INITIAL  4.  Pt will demo negative positional testing to demo decr dizziness with bed mobility. Baseline: did not assess, pt with dizziness with rolling in bed  Goal status: INITIAL    ASSESSMENT:  CLINICAL IMPRESSION: Patient is a 72 year old female referred to Neuro OPPT for Vertigo.   Pt's PMH is significant for: Anxiety, Asthma, Chronic anemia, Chronic pain of both knees, CKD stage 3 due to type 2 diabetes mellitus, Coronary artery disease, Type 2 diabetes mellitus with neurological complications, GERD, Herniated intervertebral disc of lumbar spine, HTN, Migraines, Moderate episode of recurrent major depressive disorder, Osteoarthritis of spine,TIA (06/28/2016), Vertigo. See pertinent history and subjective above for further information about pt's vertigo/PMH. Pt with elevated BP after minimal seated evaluation (see above). Pt reports her BP is well controlled at home, but she has white coat syndrome. Educated on BP parameters for therapy and to continue to monitor at home. The following deficits were present during the exam: gait abnormalities, impaired balance, abnormal oculomotor exam, dizziness. Pt with severe dizziness with VOR cancellation. Unable to complete further vestibular eval due time constraints and pt's elevated BP. Pt would benefit from skilled PT to address these impairments and functional limitations to maximize functional mobility independence and decr dizziness.    OBJECTIVE IMPAIRMENTS: Abnormal gait, cardiopulmonary status limiting activity, decreased activity tolerance, decreased balance, difficulty walking, and dizziness.   ACTIVITY LIMITATIONS: carrying,  lifting, standing, squatting, transfers, bed mobility, and locomotion level  PARTICIPATION LIMITATIONS: cleaning, driving, shopping, and community activity  PERSONAL FACTORS: Age, Behavior pattern, Past/current experiences, Time since onset of injury/illness/exacerbation, and 3+ comorbidities: Anxiety, Asthma, Chronic anemia, Chronic pain of both knees, CKD stage 3 due to type 2 diabetes mellitus, Coronary artery disease, Type 2 diabetes mellitus with neurological complications, GERD, Herniated intervertebral disc of lumbar spine, HTN, Migraines, Moderate episode of recurrent major depressive disorder, Osteoarthritis of spine,TIA (06/28/2016), Vertigo  are also affecting patient's functional outcome.   REHAB POTENTIAL: Fair due to chronicity of vertigo, multiple co-morbidities   CLINICAL DECISION MAKING: Evolving/moderate complexity  EVALUATION COMPLEXITY: Moderate   PLAN:  PT FREQUENCY: 2x/week  PT DURATION: 4 weeks  PLANNED INTERVENTIONS: Therapeutic exercises, Therapeutic activity, Neuromuscular re-education, Balance training, Gait training, Patient/Family education, Self Care, Vestibular training, Canalith repositioning, DME instructions, and Re-evaluation  PLAN FOR NEXT SESSION: ASSESS BP. Assess MSQ/positional testing. Initial HEP for seated saccades, smooth pursuits, SLOW VOR    Arliss Journey, PT, DPT  12/29/2022, 11:51 AM

## 2022-12-29 ENCOUNTER — Encounter: Payer: Self-pay | Admitting: Physical Therapy

## 2022-12-29 ENCOUNTER — Ambulatory Visit: Payer: Medicare Other | Attending: Neurology | Admitting: Physical Therapy

## 2022-12-29 ENCOUNTER — Other Ambulatory Visit (HOSPITAL_BASED_OUTPATIENT_CLINIC_OR_DEPARTMENT_OTHER): Payer: Medicare Other

## 2022-12-29 VITALS — BP 147/105 | HR 60

## 2022-12-29 DIAGNOSIS — R2681 Unsteadiness on feet: Secondary | ICD-10-CM | POA: Insufficient documentation

## 2022-12-29 DIAGNOSIS — Z9181 History of falling: Secondary | ICD-10-CM

## 2022-12-29 DIAGNOSIS — R42 Dizziness and giddiness: Secondary | ICD-10-CM | POA: Insufficient documentation

## 2023-01-01 NOTE — Progress Notes (Unsigned)
Electrophysiology Office Note:    Date:  01/02/2023   ID:  Tijuana, Hartin 07/24/1951, MRN [id number]  PCP:  Salli Real, MD   [facility] Providers Cardiologist:  Lesleigh Noe, MD (Inactive)     Referring MD: Chilton Si, MD   History of Present Illness:    Julie Gilbert is a 72 y.o. female with a hx listed below, significant for non-obstructive CAD, TIA, CKD 3, DM , HTN, HLD, referred for to discuss ILR for recurrent syncope.  She has had multiple syncopal events since she was a child.  It seems like occur in clusters about every 7 years.  When she is experiencing episodes, they tend to occur less frequently than once a month.  She cannot identify a specific trigger, but she has had episodes occurred while she was on the toilet.  The most recent was in late February of this year. A family member found her unconscious on the floor.  CPR was performed briefly, patient woke up.  She denies chest pain, ankle edema, shortness of breath.   Past Medical History:  Diagnosis Date   Anxiety 05/18/2018   Asthma    Chronic anemia 06/29/2016   Chronic pain of both knees 02/15/2019   CKD stage 3 due to type 2 diabetes mellitus 05/18/2018   Coronary artery disease 10/01/2018   Diabetes mellitus without complication    Dyslipidemia associated with type 2 diabetes mellitus 05/18/2018   Lab Results Component Value Date  CHOL 198 08/17/2018  HDL 62.40 08/17/2018  LDLCALC 113 (H) 08/17/2018  TRIG 113.0 08/17/2018  CHOLHDL 3 08/17/2018     Gastroesophageal reflux disease without esophagitis 06/18/2015   Herniated intervertebral disc of lumbar spine 05/01/2017   Hypertension    Hypertension associated with diabetes 05/13/2015   IBS (irritable bowel syndrome)    Insomnia 05/13/2015   Migraine headache 05/13/2015   Moderate episode of recurrent major depressive disorder 01/04/2017   Osteoarthritis of spine 02/03/2017   Transient ischemic attack (TIA) 06/28/2016   Type 2 diabetes  mellitus with neurological complications 05/13/2015   Lab Results Component Value Date  HGBA1C 5.9 08/17/2018  HGBA1C 5.8 01/04/2017  HGBA1C 6.4 (H) 06/29/2016  Lab Results Component Value Date  MICROALBUR 10.3 (H) 08/17/2018  LDLCALC 113 (H) 08/17/2018  CREATININE 1.56 (H) 08/17/2018      Past Surgical History:  Procedure Laterality Date   CESAREAN SECTION     EXPLORATORY LAPAROTOMY      Current Medications: Current Meds  Medication Sig   cyclobenzaprine (FLEXERIL) 10 MG tablet Take 10 mg by mouth 3 (three) times daily as needed.   rosuvastatin (CRESTOR) 10 MG tablet Take 10 mg by mouth daily.     Allergies:   Peanuts [peanut oil], Shrimp [shellfish allergy], Latex, Elemental sulfur, Iodinated contrast media, Other, Penicillins, Pineapple, Repatha [evolocumab], and Sulfa antibiotics   Social and Family History: Reviewed in Epic  ROS:   Please see the history of present illness.    All other systems reviewed and are negative.  EKGs/Labs/Other Studies Reviewed Today:    Echocardiogram:  TTE 12/28/22 EF 45-50%    EKG:  Last EKG results: 02/16/2022 Sinus bradycardia   Recent Labs: 02/16/2022: ALT 15 11/03/2022: BUN 18; Creatinine, Ser 1.82; Hemoglobin 9.4; Platelets 91; Potassium 5.0; Sodium 134 11/28/2022: TSH 1.550     Physical Exam:    VS:  BP (!) 138/90   Pulse 63   Ht 5\' 7"  (1.702 m)   Wt 160 lb  3.2 oz (72.7 kg)   LMP  (LMP Unknown) Comment: Menopausal  SpO2 97%   BMI 25.09 kg/m     Wt Readings from Last 3 Encounters:  01/02/23 160 lb 3.2 oz (72.7 kg)  11/29/22 163 lb (73.9 kg)  11/28/22 163 lb (73.9 kg)     GEN: Well nourished, well developed in no acute distress CARDIAC: RRR, no murmurs, rubs, gallops RESPIRATORY:  Normal work of breathing MUSCULOSKELETAL: no edema    ASSESSMENT & PLAN:    Recurrent syncope Episodes are infrequent, occurring less than once a month.  Would be unlikely to detect an episode with traditional wearable monitors. Possibly  vasovagal.  She has no prodrome.  She has not hit her head from falls. Loop recorder has been requested.  I explained the procedure to her, the risks which include infection and bleeding.   PROCEDURES:   1. Implantable loop recorder implantation    INTRODUCTION:  Julie Gilbert presents with a history of syncope     DESCRIPTION OF PROCEDURE:  Informed written consent was obtained.  A timeout was performed. The patient required no sedation for the procedure today. The patients left chest was prepped and draped in the usual sterile fashion. The skin overlying the left parasternal region was infiltrated with lidocaine for local analgesia.  A 0.5-cm incision was made over the left parasternal region over the 3rd intercostal space.  A subcutaneous ILR pocket was fashioned using a combination of sharp and blunt dissection.  A Medtronic Reveal LINQ  [id number] Simplantable loop recorder was then placed into the pocket  R waves were very prominent and measured >0.4mV.  Steri- Strips and a sterile dressing were then applied.  There were no early apparent complications.     CONCLUSIONS:   1. Successful implantation of a implantable loop recorder for a history of recurrent syncope  2. No early apparent complications.   Maurice Small, MD  Cardiac Electrophysiology           Medication Adjustments/Labs and Tests Ordered: Current medicines are reviewed at length with the patient today.  Concerns regarding medicines are outlined above.  No orders of the defined types were placed in this encounter.  No orders of the defined types were placed in this encounter.    Signed, Maurice Small, MD  01/02/2023 9:22 AM    [facility]

## 2023-01-02 ENCOUNTER — Encounter: Payer: Self-pay | Admitting: Cardiovascular Disease

## 2023-01-02 ENCOUNTER — Ambulatory Visit: Payer: Medicare Other | Attending: Cardiovascular Disease | Admitting: Cardiovascular Disease

## 2023-01-02 VITALS — BP 138/90 | HR 63 | Ht 67.0 in | Wt 160.2 lb

## 2023-01-02 DIAGNOSIS — R55 Syncope and collapse: Secondary | ICD-10-CM | POA: Diagnosis not present

## 2023-01-02 NOTE — Telephone Encounter (Signed)
Letter was mailed to patients home 12/22/2022  of EEG results and  making patient aware of 72 hour  EEG  referral tried to reach patient several times .

## 2023-01-02 NOTE — Patient Instructions (Signed)
Medication Instructions:  Your physician recommends that you continue on your current medications as directed. Please refer to the Current Medication list given to you today.  Labwork: None ordered.  Testing/Procedures: None ordered.  Follow-Up:  Your physician wants you to follow-up in: one year with Dr. Tiana Loft, or EP APP.  You will receive a reminder letter in the mail two months in advance. If you don't receive a letter, please call our office to schedule the follow-up appointment.    Implantable Loop Recorder Placement, Care After This sheet gives you information about how to care for yourself after your procedure. Your health care provider may also give you more specific instructions. If you have problems or questions, contact your health care provider. What can I expect after the procedure? After the procedure, it is common to have: Soreness or discomfort near the incision. Some swelling or bruising near the incision.  Follow these instructions at home: Incision care  Monitor your cardiac device site for redness, swelling, and drainage. Call the device clinic at [phone] if you experience these symptoms or fever/chills.  Keep the large square bandage on your site for 24 hours and then you may remove it yourself. Keep the steri-strips underneath in place.   You may shower after 72 hours / 3 days from your procedure with the steri-strips in place. They will usually fall off on their own, or may be removed after 10 days. Pat dry.   Avoid lotions, ointments, or perfumes over your incision until it is well-healed.  Please do not submerge in water until your site is completely healed.   Your device is MRI compatible.   Remote monitoring is used to monitor your cardiac device from home. This monitoring is scheduled every month by our office. It allows [place] to keep an eye on the function of your device to ensure it is working properly.  If your wound site starts to bleed  apply pressure.    For help with the monitor please call Medtronic Monitor Support Specialist directly at [phone].    If you have any questions/concerns please call the device clinic at [phone].  Activity  Return to your normal activities.  General instructions Follow instructions from your health care provider about how to manage your implantable loop recorder and transmit the information. Learn how to activate a recording if this is necessary for your type of device. You may go through a metal detection gate, and you may let someone hold a metal detector over your chest. Show your ID card if needed. Do not have an MRI unless you check with your health care provider first. Take over-the-counter and prescription medicines only as told by your health care provider. Keep all follow-up visits as told by your health care provider. This is important. Contact a health care provider if: You have redness, swelling, or pain around your incision. You have a fever. You have pain that is not relieved by your pain medicine. You have triggered your device because of fainting (syncope) or because of a heartbeat that feels like it is racing, slow, fluttering, or skipping (palpitations). Get help right away if you have: Chest pain. Difficulty breathing. Summary After the procedure, it is common to have soreness or discomfort near the incision. Change your dressing as told by your health care provider. Follow instructions from your health care provider about how to manage your implantable loop recorder and transmit the information. Keep all follow-up visits as told by your health care provider. This  is important. This information is not intended to replace advice given to you by your health care provider. Make sure you discuss any questions you have with your health care provider. Document Released: 08/24/2015 Document Revised: 10/28/2017 Document Reviewed: 10/28/2017 Elsevier Patient  Education  2020 [employer].

## 2023-01-03 ENCOUNTER — Encounter: Payer: Self-pay | Admitting: Cardiovascular Disease

## 2023-01-03 ENCOUNTER — Telehealth (HOSPITAL_BASED_OUTPATIENT_CLINIC_OR_DEPARTMENT_OTHER): Payer: Self-pay

## 2023-01-03 ENCOUNTER — Ambulatory Visit: Payer: Medicare Other | Admitting: Physical Therapy

## 2023-01-03 DIAGNOSIS — I6523 Occlusion and stenosis of bilateral carotid arteries: Secondary | ICD-10-CM

## 2023-01-03 NOTE — Telephone Encounter (Addendum)
Seen by patient Julie Gilbert on 01/03/2023 11:35 AM; repeat testing ordered for one year   ----- Message from Alver Sorrow, NP sent at 01/03/2023 10:01 AM EDT ----- Mild bilateral carotid stenosis.  Recommend repeat duplex in 1 year for monitoring.

## 2023-01-05 ENCOUNTER — Ambulatory Visit: Payer: Medicare Other | Admitting: Physical Therapy

## 2023-01-10 ENCOUNTER — Ambulatory Visit: Payer: Medicare Other | Admitting: Physical Therapy

## 2023-01-12 ENCOUNTER — Encounter: Payer: Medicare Other | Admitting: Physical Therapy

## 2023-01-17 ENCOUNTER — Encounter: Payer: Medicare Other | Admitting: Physical Therapy

## 2023-01-19 ENCOUNTER — Encounter: Payer: Medicare Other | Admitting: Physical Therapy

## 2023-01-24 ENCOUNTER — Encounter: Payer: Medicare Other | Admitting: Physical Therapy

## 2023-01-26 ENCOUNTER — Encounter: Payer: Medicare Other | Admitting: Physical Therapy

## 2023-02-06 ENCOUNTER — Ambulatory Visit (INDEPENDENT_AMBULATORY_CARE_PROVIDER_SITE_OTHER): Payer: Medicare Other

## 2023-02-06 DIAGNOSIS — R55 Syncope and collapse: Secondary | ICD-10-CM

## 2023-02-06 LAB — CUP PACEART REMOTE DEVICE CHECK
Date Time Interrogation Session: 20240512230321
Implantable Pulse Generator Implant Date: 20240408

## 2023-02-07 ENCOUNTER — Other Ambulatory Visit (HOSPITAL_COMMUNITY): Payer: Self-pay

## 2023-02-08 ENCOUNTER — Telehealth: Payer: Self-pay

## 2023-02-08 ENCOUNTER — Other Ambulatory Visit (HOSPITAL_COMMUNITY): Payer: Self-pay

## 2023-02-08 NOTE — Telephone Encounter (Signed)
Patient Advocate Encounter   Received notification from [employer] Medicare Part D that prior authorization is required for Nurtec 75MG  dispersible tablets   Submitted: 02-08-2023 Key [id number]  Status is pending

## 2023-02-16 NOTE — Telephone Encounter (Signed)
Patient Advocate Encounter  Prior Authorization for Nurtec 75MG  dispersible tablets has been approved.    PA# [id number] Insurance [employer] Medicare Part D Electronic Prior Authorization Form Effective dates: 02/08/2023 through 09/26/2023

## 2023-02-24 ENCOUNTER — Other Ambulatory Visit: Payer: Self-pay | Admitting: Cardiovascular Disease

## 2023-02-24 ENCOUNTER — Encounter: Payer: Self-pay | Admitting: Neurology

## 2023-02-27 ENCOUNTER — Ambulatory Visit (HOSPITAL_BASED_OUTPATIENT_CLINIC_OR_DEPARTMENT_OTHER): Payer: Medicare Other | Admitting: Family

## 2023-02-28 NOTE — Telephone Encounter (Signed)
Spoke with Inez Pilgrim @ [facility]. They are going to try to call the patient again to schedule.

## 2023-03-02 NOTE — Progress Notes (Signed)
Carelink Summary Report / Loop Recorder 

## 2023-03-13 ENCOUNTER — Ambulatory Visit (INDEPENDENT_AMBULATORY_CARE_PROVIDER_SITE_OTHER): Payer: Medicare Other

## 2023-03-13 DIAGNOSIS — R55 Syncope and collapse: Secondary | ICD-10-CM | POA: Diagnosis not present

## 2023-03-13 LAB — CUP PACEART REMOTE DEVICE CHECK
Date Time Interrogation Session: 20240616231325
Implantable Pulse Generator Implant Date: 20240408

## 2023-03-22 ENCOUNTER — Other Ambulatory Visit: Payer: Self-pay | Admitting: Neurology

## 2023-03-22 DIAGNOSIS — R42 Dizziness and giddiness: Secondary | ICD-10-CM

## 2023-04-03 NOTE — Progress Notes (Signed)
Carelink Summary Report / Loop Recorder 

## 2023-04-08 DIAGNOSIS — R55 Syncope and collapse: Secondary | ICD-10-CM | POA: Diagnosis not present

## 2023-04-08 DIAGNOSIS — R410 Disorientation, unspecified: Secondary | ICD-10-CM | POA: Diagnosis not present

## 2023-04-08 DIAGNOSIS — R569 Unspecified convulsions: Secondary | ICD-10-CM | POA: Diagnosis not present

## 2023-04-10 ENCOUNTER — Ambulatory Visit (HOSPITAL_BASED_OUTPATIENT_CLINIC_OR_DEPARTMENT_OTHER): Payer: Medicare Other | Admitting: Family

## 2023-04-16 ENCOUNTER — Other Ambulatory Visit: Payer: Self-pay | Admitting: Neurology

## 2023-04-16 DIAGNOSIS — R55 Syncope and collapse: Secondary | ICD-10-CM

## 2023-04-16 DIAGNOSIS — R569 Unspecified convulsions: Secondary | ICD-10-CM

## 2023-04-16 NOTE — Procedures (Signed)
Clinical History : This is a 72 y/o F who presents with migraines and vertigo. She fell backwards and hit her head on the fridge and was 'out for 5 minutes' and had to be resuscitated. Her eyes rolled back, eyes open, BP low.  INTERMITTENT MONITORING with VIDEO TECHNICAL SUMMARY: This AVEEG was performed using equipment provided by [employer] utilizing Bluetooth ( [employer] ) amplifiers with continuous EEGT attended video collection using encrypted remote transmission via [employer] secured cellular tower network with data rates for each AVEEG performed. This is a Therapist, music AVEEG, obtained, according to the 10-20 international electrode placement system, reformatted digitally into referential and bipolar montages. Data was acquired with a minimum of 21 bipolar connections and sampled at a minimum rate of 250 cycles per second per channel, maximum rate of 450 cycles per second per channel and two channels for EKG. The entire VEEG study was recorded through cable and or radio telemetry for subsequent analysis. Specified epochs of the AVEEG data were identified at the direction of the subject by the depression of a push button by the patient. Each patients event file included data acquired two minutes prior to the push button activation and continuing until two minutes afterwards. AVEEG files were reviewed on [employer] server, Licensed Software provided by [employer] with a digital high frequency filter set at 70 Hz and a low frequency filter set at 1 Hz with a paper speed of 28mm/s resulting in 10 seconds per digital page. This entire AVEEG was reviewed by the EEG Technologist. Random time samples, random sleep samples, clips, patient initiated push button files with included patient daily diary logs, EEG Technologist pruned data was reviewed and verified for accuracy and validity by the governing reading neurologist in full details. This AEEGV was fully compliant with all  requirements for CPT 97500 for setup, patient education, take down and administered by an EEG technologist.  Long-Term EEG with Video was monitored intermittently by a qualified EEG technologist for the entirety of the recording; quality check-ins were performed at a minimum of every two hours, checking and documenting real-time data and video to assure the integrity and quality of the recording (e.g., camera position, electrode integrity and impedance), and identify the need for maintenance. For intermittent monitoring, an EEG Technologist monitored no more than 12 patients concurrently. Diagnostic video was captured at least 80% of the time during the recording.  PATIENT EVENTS: There were no patient events noted or captured during this recording.  TECHNOLOGIST EVENTS: No clear epileptiform activity was detected by the reviewing neurodiagnostic technologist during the recording for further evaluation.  TIME SAMPLES: 10-minutes of every 2 hours recorded are reviewed as random time samples.  SLEEP SAMPLES: 5-minutes of every 24 hours recorded are reviewed as random sleep samples.  AWAKE: At maximal level of alertness, the posterior dominant background activity was continuous, reactive, low voltage rhythm of 8.5 Hz. This was symmetric, well-modulated, and attenuated with eye opening. Diffuse, symmetric, frontocentral beta range activity was present.  SLEEP: N1 Sleep (Stage 1) was observed and characterized by the disappearance of alpha rhythm and the appearance of vertex activity. N2 Sleep (Stage 2) was observed and characterized by vertex waves, K-complexes, and sleep spindles. N3 (Stage 3) sleep was observed and characterized by high amplitude Delta activity of 20%. REM sleep was observed.  EKG: There were no arrhythmias or abnormalities noted during this recording.   Impression: This is a normal 72 hours ambulatory video EEG. There were no seizures, events  or epileptiform discharges  during this recording.    Windell Norfolk, MD [facility]

## 2023-04-17 ENCOUNTER — Ambulatory Visit (INDEPENDENT_AMBULATORY_CARE_PROVIDER_SITE_OTHER): Payer: Medicare Other

## 2023-04-17 DIAGNOSIS — R55 Syncope and collapse: Secondary | ICD-10-CM | POA: Diagnosis not present

## 2023-04-17 LAB — CUP PACEART REMOTE DEVICE CHECK
Date Time Interrogation Session: 20240719230657
Implantable Pulse Generator Implant Date: 20240408

## 2023-04-20 ENCOUNTER — Ambulatory Visit: Payer: Medicare Other | Admitting: Family Medicine

## 2023-04-26 ENCOUNTER — Encounter (INDEPENDENT_AMBULATORY_CARE_PROVIDER_SITE_OTHER): Payer: Self-pay

## 2023-04-27 NOTE — Progress Notes (Signed)
Carelink Summary Report / Loop Recorder 

## 2023-05-01 ENCOUNTER — Ambulatory Visit (HOSPITAL_BASED_OUTPATIENT_CLINIC_OR_DEPARTMENT_OTHER): Payer: Medicare Other | Admitting: Family

## 2023-05-01 ENCOUNTER — Encounter (HOSPITAL_BASED_OUTPATIENT_CLINIC_OR_DEPARTMENT_OTHER): Payer: Self-pay | Admitting: Family

## 2023-05-01 VITALS — BP 122/84 | HR 68 | Ht 67.0 in | Wt 164.0 lb

## 2023-05-01 DIAGNOSIS — E785 Hyperlipidemia, unspecified: Secondary | ICD-10-CM

## 2023-05-01 DIAGNOSIS — I5022 Chronic systolic (congestive) heart failure: Secondary | ICD-10-CM

## 2023-05-01 DIAGNOSIS — I2510 Atherosclerotic heart disease of native coronary artery without angina pectoris: Secondary | ICD-10-CM | POA: Diagnosis not present

## 2023-05-01 DIAGNOSIS — I10 Essential (primary) hypertension: Secondary | ICD-10-CM | POA: Diagnosis not present

## 2023-05-01 NOTE — Progress Notes (Signed)
Cardiology Office Note:  .   Date:  05/01/2023  ID:  Julie Gilbert, DOB 1951-02-15, MRN [id number] PCP: Salli Real, MD  [facility] Providers Cardiologist:  Chilton Si, MD    History of Present Illness: .   Julie Gilbert is a 73 y.o. female with hx of HTN, syncope, nonobstructive CAD, carotid stenosis, DM2, TIA, HLD, CKD3, HFmrEF.   Previous intolerance to Amlodipine, statin. Did tolerate Rosuvastatin. Unable to tolerate Repatha due to flu-like symptoms. She did tolerate Nexlizet.   Prior stress test 01/2018 anteroseptal perfusion defect. LHC revealed 25-30% distal LM disease and 50% mid RCA. Minimal disease in LAD and LCx.   Seen 02/03/22 and cardiac CT ordered but not performed. Seen by Dr. Duke Salvia 11/29/22  noting a week or two prior had fall and family member found her unconscious, EMT noted BP 98/60 and provided IVF. Echo 12/28/22 LVEF 45-50%, mild LVH, gr1DD,trivial AI, borderline dilation aortic root 38mm and ascending aorta 40mm.  Carotid duplex 4/6 was 24 mild bilateral stenosis.  Seen by Dr. Nelly Laurence 01/02/2023 and loop recorder placed.  Presents today for follow-up with her daughter.  Reports she is having episode of vertigo and feels poorly today.  She is followed with Dr. Lucia Gaskins of neurology for vertigo despite vestibular therapy, as needed meclizine, scopolamine patch.  She is planning to have her ILR explanted as she does not wish to have device any longer. BP at home 122/84. No recurrent syncope. Reports dyspnea with more than usual activity, activity tolerance has been limited by vertigo. No chest pain, pressure, tightness. No edema, orthopnea, PND.    ROS: Please see the history of present illness.    All other systems reviewed and are negative.   Studies Reviewed: .        Cardiac Studies & Procedures       ECHOCARDIOGRAM  ECHOCARDIOGRAM COMPLETE 12/28/2022  Narrative ECHOCARDIOGRAM REPORT    Patient Name:   Julie Gilbert Date of Exam: 12/28/2022 Medical  Rec #:  [id number]       Height:       67.0 in Accession #:    [id number]      Weight:       163.0 lb Date of Birth:  [birth year]       BSA:          1.854 m Patient Age:    37 years        BP:           130/80 mmHg Patient Gender: F               HR:           64 bpm. Exam Location:  Outpatient  Procedure: 2D Echo, 3D Echo, Cardiac Doppler, Color Doppler and Strain Analysis  Indications:    Syncope  History:        Patient has prior history of Echocardiogram examinations, most recent 07/09/2016. Signs/Symptoms:Syncope; Risk Factors:Hypertension, Diabetes, Dyslipidemia and Former Smoker.  Sonographer:    Jeryl Columbia RDCS Referring Phys: [id number] Julie Gilbert   IMPRESSIONS   1. Left ventricular ejection fraction, by estimation, is 45 to 50%. Left ventricular ejection fraction by 3D volume is 47 %. The left ventricle has mildly decreased function. The left ventricle demonstrates global hypokinesis. There is mild concentric left ventricular hypertrophy. Left ventricular diastolic parameters are consistent with Grade I diastolic dysfunction (impaired relaxation). 2. Right ventricular systolic function is normal. The right ventricular size is normal. 3. The mitral valve  is grossly normal. Trivial mitral valve regurgitation. No evidence of mitral stenosis. 4. The aortic valve is tricuspid. There is mild calcification of the aortic valve. There is mild thickening of the aortic valve. Aortic valve regurgitation is trivial. Aortic valve sclerosis/calcification is present, without any evidence of aortic stenosis. 5. Aortic dilatation noted. There is borderline dilatation of the aortic root, measuring 38 mm. There is borderline dilatation of the ascending aorta, measuring 40 mm. 6. The inferior vena cava is normal in size with greater than 50% respiratory variability, suggesting right atrial pressure of 3 mmHg.  Comparison(s): Compared to prior TTE report in 2017, the LVEF has dropped from  60-65% to 45-50%.  FINDINGS Left Ventricle: Left ventricular ejection fraction, by estimation, is 45 to 50%. Left ventricular ejection fraction by 3D volume is 47 %. The left ventricle has mildly decreased function. The left ventricle demonstrates global hypokinesis. Global longitudinal strain performed but not reported based on interpreter judgement due to suboptimal tracking. The left ventricular internal cavity size was normal in size. There is mild concentric left ventricular hypertrophy. Left ventricular diastolic parameters are consistent with Grade I diastolic dysfunction (impaired relaxation).  Right Ventricle: The right ventricular size is normal. No increase in right ventricular wall thickness. Right ventricular systolic function is normal.  Left Atrium: Left atrial size was normal in size.  Right Atrium: Right atrial size was normal in size.  Pericardium: There is no evidence of pericardial effusion.  Mitral Valve: The mitral valve is grossly normal. Trivial mitral valve regurgitation. No evidence of mitral valve stenosis.  Tricuspid Valve: The tricuspid valve is normal in structure. Tricuspid valve regurgitation is mild.  Aortic Valve: The aortic valve is tricuspid. There is mild calcification of the aortic valve. There is mild thickening of the aortic valve. Aortic valve regurgitation is trivial. Aortic regurgitation PHT measures 474 msec. Aortic valve sclerosis/calcification is present, without any evidence of aortic stenosis.  Pulmonic Valve: The pulmonic valve was normal in structure. Pulmonic valve regurgitation is trivial.  Aorta: Aortic dilatation noted. There is borderline dilatation of the aortic root, measuring 38 mm. There is borderline dilatation of the ascending aorta, measuring 40 mm.  Venous: The inferior vena cava is normal in size with greater than 50% respiratory variability, suggesting right atrial pressure of 3 mmHg.  IAS/Shunts: The atrial septum is  grossly normal.   LEFT VENTRICLE PLAX 2D LVIDd:         4.66 cm         Diastology LVIDs:         3.35 cm         LV e' medial:    3.92 cm/s LV PW:         1.14 cm         LV E/e' medial:  7.1 LV IVS:        1.18 cm         LV e' lateral:   4.90 cm/s LVOT diam:     2.10 cm         LV E/e' lateral: 5.7 LV SV:         68 LV SV Index:   36 LVOT Area:     3.46 cm        3D Volume EF LV 3D EF:    Left ventricul ar ejection fraction by 3D volume is 47 %.  3D Volume EF: 3D EF:        47 % LV EDV:  72 ml LV ESV:       38 ml LV SV:        34 ml  RIGHT VENTRICLE RV Basal diam:  4.88 cm RV Mid diam:    4.36 cm RV S prime:     12.90 cm/s TAPSE (M-mode): 1.6 cm  LEFT ATRIUM             Index        RIGHT ATRIUM           Index LA diam:        3.70 cm 2.00 cm/m   RA Area:     14.70 cm LA Vol (A2C):   32.7 ml 17.64 ml/m  RA Volume:   38.80 ml  20.93 ml/m LA Vol (A4C):   37.3 ml 20.12 ml/m LA Biplane Vol: 37.5 ml 20.23 ml/m AORTIC VALVE LVOT Vmax:   98.50 cm/s LVOT Vmean:  61.800 cm/s LVOT VTI:    0.195 m AI PHT:      474 msec  AORTA Ao Root diam: 3.80 cm Ao Asc diam:  3.90 cm  MITRAL VALVE               TRICUSPID VALVE MV Area (PHT): 2.00 cm    TR Peak grad:   22.1 mmHg MV Decel Time: 380 msec    TR Vmax:        235.00 cm/s MV E velocity: 27.80 cm/s MV A velocity: 68.70 cm/s  SHUNTS MV E/A ratio:  0.40        Systemic VTI:  0.20 m Systemic Diam: 2.10 cm  Laurance Flatten MD Electronically signed by Laurance Flatten MD Signature Date/Time: 12/28/2022/11:20:13 AM    Final    MONITORS  CARDIAC EVENT MONITOR 06/08/2021  Narrative  Normal sinus rhythm  Rare PAC's and PVC's           Risk Assessment/Calculations:             Physical Exam:   VS:  BP 122/84 Comment: home BP  Pulse 68   Ht 5\' 7"  (1.702 m)   Wt 164 lb (74.4 kg)   LMP  (LMP Unknown) Comment: Menopausal  BMI 25.69 kg/m    Wt Readings from Last 3 Encounters:  05/01/23 164 lb  (74.4 kg)  01/02/23 160 lb 3.2 oz (72.7 kg)  11/29/22 163 lb (73.9 kg)    GEN: Well nourished, well developed in no acute distress NECK: No JVD; No carotid bruits CARDIAC: RRR, no murmurs, rubs, gallops RESPIRATORY:  Clear to auscultation without rales, wheezing or rhonchi  ABDOMEN: Soft, non-tender, non-distended EXTREMITIES:  No edema; No deformity   ASSESSMENT AND PLAN: .    HTN - BP well controlled. Continue current antihypertensive regimen.    HFmrEF / Aortic dilation -echo 12/28/2022 LVEF 45-50%, borderline dilation aortic root 38 mm, ascending aorta 40 mm.  Consider repeat echo 12/2023.  Can be coordinated at follow-up. Euvolemic and well compensated on exam.  Continue GDMT losartan.  She is asymptomatic from heart failure perspective will not further intensify therapy.  No indication for loop diuretic at this time.  Discussed to contact the office for worsening dyspnea, edema.  Syncope s/p ILR -no recurrence.  ILR thus far with no arrhythmias.  She does have upcoming appointment for explantation as she states she no longer wishes to have ILR.  Nonobstructive CAD / HLD - Stable with no anginal symptoms. No indication for ischemic evaluation.  GDMT aspirin, rosuvastatin. Heart healthy diet and regular  cardiovascular exercise encouraged.        Dispo: follow up in 4-6 months  Signed, Alver Sorrow, NP

## 2023-05-01 NOTE — Patient Instructions (Signed)
Medication Instructions:  Your physician recommends that you continue on your current medications as directed. Please refer to the Current Medication list given to you today.  Follow-Up: At [facility], you and your health needs are our priority.  As part of our continuing mission to provide you with exceptional heart care, we have created designated Provider Care Teams.  These Care Teams include your primary Cardiologist (physician) and Advanced Practice Providers (APPs -  Physician Assistants and Nurse Practitioners) who all work together to provide you with the care you need, when you need it.  We recommend signing up for the patient portal called "MyChart".  Sign up information is provided on this After Visit Summary.  MyChart is used to connect with patients for Virtual Visits (Telemedicine).  Patients are able to view lab/test results, encounter notes, upcoming appointments, etc.  Non-urgent messages can be sent to your provider as well.   To learn more about what you can do with MyChart, go to [email or web address].    Your next appointment:   Follow up with Dr. Duke Salvia in 4-6 months

## 2023-05-08 NOTE — Progress Notes (Unsigned)
No chief complaint on file.   HISTORY OF PRESENT ILLNESS:  05/08/23 ALL:  Sorina returns for follow up for migraines, dizziness and syncopal/seizure like episodes. I saw her last 06/2022 following concerns from PT of abnormal ocular movements on exam. MRI brian and orbits ordered but not completed. She was last seen by Dr Lucia Gaskins 11/2022. Headaches were well managed on Emgality and topiramate 50/100. Nurtec and ondansetron used for abortive therapy. CT was unremarkable 10/2022. MRI brain, MRA and MR cervical spine ordered but have not been completed. Ambulatory EEG normal 11/2022. 72 hour EEG ordered with [facility]. Normal results read by Dr Teresa Coombs 03/2023.   Since,   Driving?  09/28/868 AA:  Since being seen end of January doing better on migraines and better with vertigo. Hit your head, just fell backwards.She was walking to the fridge she collapsed back. She was "out for 5 minutes" had to rescuscutate. Her eyes rolled in the back of the head, eyes open, BP very low, said she was dehydrated but drinking plenty of water, no more episodes since then, mother did NOT feel she was going out, eyes open, rolled back, she wasn't reponding, she was confused when she cme to but recognized daughter, no focal weakness, wasn't breathing, no heartbeat, daughter was literllay doing CPR unclear though if, patient went completelt darknheaded, nor=t lightheaded, no warning, after a few minutes she was fine. She couldn't stand because of dizziness. This was years ago, she went to cardiologist years ago. She sees [name] need to get her an appointment, blood pressure per daughter was 98/70 but in the ED it was higher but unclear if this was after fluids. 8 glasses a day. Make sure to include electrolytes like gatorade so we don;t get hypona+ etc discussed with daughter who proc=vided most information. She has anemia but has been to hematology, she has CKD so could be anemia due to that see Dr.  Wynelle Link. Hit head so hard daughter heard it. Vertigo is constant, dizziness and she has it right now.    Personally reviewed images.    CT head 10/2022: TECHNIQUE: Contiguous axial images were obtained from the base of the skull through the vertex without intravenous contrast.   RADIATION DOSE REDUCTION: This exam was performed according to the departmental dose-optimization program which includes automated exposure control, adjustment of the mA and/or kV according to patient size and/or use of iterative reconstruction technique.   COMPARISON:  Head CT dated 09/27/2017.   FINDINGS: Brain: The ventricles and sulci are appropriate size for patient's age. 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 There is no acute intracranial hemorrhage. No mass effect or midline shift. No extra-axial fluid collection.   Vascular: No hyperdense vessel or unexpected calcification.   Skull: Normal. Negative for fracture or focal lesion.   Sinuses/Orbits: No acute finding.   Other: None   IMPRESSION: 1. No acute intracranial pathology. 2. 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  10/25/2022 AA:   This is a 72 year old patient we have seen in the past for multiple neurologic symptoms including migraines and lacunar infarcts.  Repeat MRI of the brain and orbits was ordered in October by my NP but does not look like they were completed.  We initially saw her for migraines past medical history migraine, anxiety, asthma, chronic kidney disease, lacunar infarcts, coronary artery disease, diabetes, dyslipidemia, hypertension, depression, osteoarthritis, B12 deficiency and vertigo.  Patient has several  specialists following her including cardiology and when we first met her she was on a heart monitor.  We placed her on Emgality and Topamax which seemed to help prior to that she had daily headaches and treatment decreased it to 6 headache days a month.  She has a  family history of tremors with action and posture and we kept her on Topamax and Emgality and she declined Botox.  [name] made her sick.  But we have kept her on Emgality which has worked.  She was started on Repatha in the past, [name] did not work, we kept her on [employer] and Topamax.  Barely 2  total headaches a month on Emgaliy aborted by Tylenol.  She is also lost weight with diet changes.  She was not interested in [place] or Botox.  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  At that time she continued to work with PT for dizziness and vertigo and she was doing better.  Followed by [name] at [facility].  Using Flexeril and pregabalin and meclizine 3 times daily.  She was weaned off clonazepam and and has insomnia as well.  We recommended an eye exam.  At that time she reported about 4 migraine days in headache days per month doing great on Emgality.  She is followed by [name] last A1c 6.1 last LDL 111 and she continued aspirin 81 mg and atorvastatin and Repatha.  Sleep study was normal.  Tremors continue not at rest.  She continues to try to hydrate.   She has had a 2 week migraines but usually doing excellent on Emgality and only has 4 migraines a month will try nurtec acutely. She did miss her emgality. The weather and stress has affected her. Last emgality was Dec 28th so due for emgality now and her prescription. Put the prescription on autofill and as soon as its ready take it. [name] did not work. We discussed continuing emgality and topamax but she needs a good acute management medication, triptans contraindicated due to strokes will try and get her nurtec. Take her to iv infusion today for status migrainosus.   Patient complains of symptoms per HPI as well  as the following symptoms: status migrainosus . Pertinent negatives and positives per HPI. All others negative  07/25/2022 ALL: Haley returns for an acute visit. She has suffered from vertigo for years. She was referred to PT by Dr Lucia Gaskins. 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   She continues to work with PT. She reports that dizziness remains constant. Her daughter feels that she is doing better. She missed visits last week due to back pain. She is followed by Dr Wynelle Link with [facility] She is treated with cyclobenzaprine and pregabalin. She uses meclizine TID. She was weaned off clonazepam (anxiety) and started on Ambien 10mg  for insomnia. She denies vision loss. No double vision.  She does have blurred vision with headaches only. She occasionally has left eye pain with migraines. She has not had an eye exam recently.   She continues Emgality monthly and topiramate 50mg  in am and 100mg  in pm for migraine prevention. She reports having about 4 headache days per month. Rest will abort headache. She feels that weather changes are most common trigger.   She is followed regularly by Dr Wynelle Link. Last A1C 6.1. Last LDL 111. She continues asa 81mg  and atorvastatin 40mg . She reported side effects with Repatha. She reports being told to take iron supplements last week. She reports low Hgb. Last hgb 11. She reports blood work was updated with Dr Wynelle Link last week. She is being set up for a colonoscopy. She reports not sleeping well despite taking Ambien 10mg   daily. She reports sleep study was normal. She does not snore. She continues to have a tremor. Worse on left hand but can be both. Tremor is worse with activity. If sitting still, no tremor. She reports drinking at least 80oz of water daily.   04/19/2022 ALL: KYRIANA WIRZ is a 72 y.o. female here today for follow up for migraines. She continues emgality and topiramate 50mg  in am and 100mg  at bedtime. She reports that headaches wax and wane. She has severe allergies and reports this contributed to some months of worsening. She has had really bad allergies this year. She can't breath well in the hot weather. She reports that headaches are much better on Emgality and topiramate. Most months she has 1-2 headaches days easily aborted with Tylenol and rest. She has lost 30lbs with diet changes. She does report that injection seems to sting a little more now. She is not interested in [place] due to fears of worsening constipation. She tries to drink 4 bottles of water daily.   HISTORY (copied from Dr Trevor Mace previous note)  10/19/2021: Emgality has been working great. [name] made her sick. She has been doing excellent on the [employer]. She was a little dizzy on standing, her BP slightly elevated, we had to get her a wheelchair. Today she feels bad and has been feeling bad since the repatha. [name] does not work when she has a migraine. She stayed on her Topamax and she was doing well. Patient not feeling well after taking repatha, looks ill today, says she has been feeling unwell since taking it, her BP is slightly elevated, she appears to be tremulous, I called [facility] and they have an 8 hour wait. [facility] and no wait time but I was able to call her pcp Dr. Laural Benes and got her an appointment at 330. Daughter is here as well and provides information, she has not been feeling well since Repatha. She has been doing fantastic on Emgality, barely 2 total headaches a month   HPI 05/20/2021:  JERITA CHARANIA is a  72 y.o. female here as requested by Ardith Dark, MD for migraines. PMHx migraine, anxiety, asthma, chronic kidney disease, coronary artery disease, diabetes, dyslipidemia, hypertension, depression, osteoarthritis, lacunar stroke, B12 deficiency, vertigo. Here with dauhter who also provides information. She is doing a lot better with her migraines. She has a cardiologist. She is on a heart monitor. Her migraines cn be severe in the left side back and side, pulsating/pounding/throbbing/, pulsating/pounding/throbbing, photophobia/phonophobia, most severe pain is the back in the left, the Emgality helps a lot, she is also on Topamax. Extremely severe. Nausea, she would pass out it was so severe. Used to have daily  headaches, weather can be a trigger, a lot less maybe about 6 a month, was not able to get the [place].  Mother had tremors. With action and posture. Order [name] for acute management. Preventative Topamax and emgality. Discussed botox.No other focal neurologic deficits, associated symptoms, inciting events or modifiable factors.   Reviewed notes, labs and imaging from outside physicians, which showed:   From a thorough review of records, medications tried that can be used in migraine management include: Amlodipine (calcium channel blocker similar to verapamil), aspirin, Fioricet, Voltaren tablet, Benadryl, Emgality, Lexapro, gabapentin, meclizine, melatonin, Reglan injections, naproxen, Zofran, prednisone tablets, Phenergan tablets, Imitrex, Topamax, trazodone, propranolol contraindicated due to asthma, amitriptyline/nortrio contraindicated due to being on lexapro, TRIPTANS CONTRAINDICATED due to strokes   REVIEW OF SYSTEMS: Out of a complete 14 system review of symptoms, the patient complains only of the following symptoms, headaches, seasonal allergies, constipation and all other reviewed systems are negative.   ALLERGIES: Allergies  Allergen Reactions   Peanuts [Peanut Oil] Anaphylaxis    Shrimp [Shellfish Allergy] Anaphylaxis   Latex Hives   Elemental Sulfur Itching and Swelling   Iodinated Contrast Media Hives    Pt reported she had hives after recent procedure w/ CT contrast.    Other Other (See Comments)    Adhesive on EKG pads irritated skin   Penicillins Hives    Has patient had a PCN reaction causing immediate rash, facial/tongue/throat swelling, SOB or lightheadedness with hypotension: No Has patient had a PCN reaction causing severe rash involving mucus membranes or skin necrosis: No Has patient had a PCN reaction that required hospitalization: No Has patient had a PCN reaction occurring within the last 10 years: No  If all of the above answers are "NO", then may proceed with Cephalosporin use.     Pineapple Swelling and Other (See Comments)    Scratchy throat, Tongue swelling   Repatha [Evolocumab] Other (See Comments)    Dizzy, scratchy eyes/throat, palpitations   Sulfa Antibiotics Hives     HOME MEDICATIONS: Outpatient Medications Prior to Visit  Medication Sig Dispense Refill   Albuterol-Budesonide 90-80 MCG/ACT AERO Inhale 2 puffs into the lungs daily.     Ascorbic Acid (VITAMIN C) 1000 MG tablet Take 1,000 mg by mouth at bedtime.     aspirin EC 81 MG tablet Take 81 mg by mouth every morning. Swallow whole.     Blood Glucose Monitoring Suppl (ACCU-CHEK AVIVA PLUS) w/Device KIT Use as directed to check blood sugar once daily. E11.9 1 kit 0   Blood Pressure Monitoring (BLOOD PRESSURE KIT) DEVI 1 application by Does not apply route daily. Use to check blood pressure daily 1 Device 0   cholecalciferol (VITAMIN D3) 25 MCG (1000 UNIT) tablet Take 1,000 Units by mouth at bedtime.     COD LIVER OIL PO Take 1-2 capsules by mouth See admin instructions. Take one capsule by mouth every morning and take two capsules at night     cyclobenzaprine (FLEXERIL) 10 MG tablet Take 10 mg by mouth 3 (three) times daily as needed.     EMGALITY 120 MG/ML SOAJ Inject 120 ml  monthly. 1 mL 11   escitalopram (LEXAPRO) 20 MG tablet Take 20 mg by mouth daily.     fluticasone (FLONASE) 50 MCG/ACT nasal spray PLACE 1 SPRAY INTO BOTH NOSTRILS DAILY AS NEEDED FOR ALLERGIES OR RHINITIS. 48 mL 3   GARLIC PO Take 1 tablet by mouth at bedtime.     glucose blood (ACCU-CHEK AVIVA PLUS) test strip  Use as instructed to check blood sugar once daily. E11.9 100 each 12   Lancets (ACCU-CHEK SOFT TOUCH) lancets Use as instructed to check blood sugar once daily E11.9 100 each 12   losartan (COZAAR) 25 MG tablet TAKE 1 TABLET BY MOUTH EVERY DAY 90 tablet 3   meclizine (ANTIVERT) 25 MG tablet Take 1 tablet (25 mg total) by mouth 3 (three) times daily. 180 tablet 3   Multiple Vitamin (MULTIVITAMIN WITH MINERALS) TABS tablet Take 1 tablet by mouth at bedtime.     Omega-3 Fatty Acids (FISH OIL PO) Take 1 capsule by mouth at bedtime.     ondansetron (ZOFRAN-ODT) 4 MG disintegrating tablet Take 1-2 tablets (4-8 mg total) by mouth every 8 (eight) hours as needed. 30 tablet 3   pantoprazole (PROTONIX) 40 MG tablet Take 40 mg by mouth every morning.     pregabalin (LYRICA) 50 MG capsule 50 mg daily.     Rimegepant Sulfate (NURTEC) 75 MG TBDP Take 1 tablet (75 mg total) by mouth daily as needed. For migraines. Take as close to onset of migraine as possible. One daily maximum. 16 tablet 11   rosuvastatin (CRESTOR) 10 MG tablet Take 10 mg by mouth daily.     scopolamine (TRANSDERM-SCOP) 1 MG/3DAYS PLACE 1 PATCH ONTO THE SKIN EVERY 3 DAYS. 10 patch 0   scopolamine (TRANSDERM-SCOP) 1 MG/3DAYS PLACE 1 PATCH ONTO THE SKIN EVERY 3 DAYS. 10 patch 0   topiramate (TOPAMAX) 50 MG tablet Take 1 tablet (50 mg total) by mouth in the morning AND 2 tablets (100 mg total) at bedtime. 270 tablet 3   zolpidem (AMBIEN) 10 MG tablet Take 10 mg by mouth at bedtime.     No facility-administered medications prior to visit.     PAST MEDICAL HISTORY: Past Medical History:  Diagnosis Date   Anxiety 05/18/2018   Asthma     Chronic anemia 06/29/2016   Chronic pain of both knees 02/15/2019   CKD stage 3 due to type 2 diabetes mellitus (HCC) 05/18/2018   Coronary artery disease 10/01/2018   Diabetes mellitus without complication (HCC)    Dyslipidemia associated with type 2 diabetes mellitus (HCC) 05/18/2018   Lab Results Component Value Date  CHOL 198 08/17/2018  HDL 62.40 08/17/2018  LDLCALC 113 (H) 08/17/2018  TRIG 113.0 08/17/2018  CHOLHDL 3 08/17/2018     Gastroesophageal reflux disease without esophagitis 06/18/2015   Herniated intervertebral disc of lumbar spine 05/01/2017   Hypertension    Hypertension associated with diabetes (HCC) 05/13/2015   IBS (irritable bowel syndrome)    Insomnia 05/13/2015   Migraine headache 05/13/2015   Moderate episode of recurrent major depressive disorder (HCC) 01/04/2017   Osteoarthritis of spine 02/03/2017   Transient ischemic attack (TIA) 06/28/2016   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   Lab Results Component Value Date  HGBA1C 5.9 08/17/2018  HGBA1C 5.8 01/04/2017  HGBA1C 6.4 (H) 06/29/2016  Lab Results Component Value Date  MICROALBUR 10.3 (H) 08/17/2018  LDLCALC 113 (H) 08/17/2018  CREATININE 1.56 (H) 08/17/2018       PAST SURGICAL HISTORY: Past Surgical History:  Procedure Laterality Date   CESAREAN SECTION     EXPLORATORY LAPAROTOMY       FAMILY HISTORY: Family History  Problem Relation Age of Onset   Kidney failure Mother    Hypertension Mother    Heart disease Father    Cancer Maternal Grandfather    Migraines Other    Breast cancer Neg Hx  SOCIAL HISTORY: Social History   Socioeconomic History   Marital status: Divorced    Spouse name: Not on file   Number of children: Not on file   Years of education: Not on file   Highest education level: Not on file  Occupational History   Not on file  Tobacco Use   Smoking status: Former    Current packs/day: 0.00    Types: Cigarettes    Quit date: 09/22/2012     Years since quitting: 10.6   Smokeless tobacco: Never  Vaping Use   Vaping status: Never Used  Substance and Sexual Activity   Alcohol use: Yes    Alcohol/week: 1.0 standard drink of alcohol    Types: 1 Glasses of wine per week    Comment: red wine 1-2 x month   Drug use: No   Sexual activity: Not Currently  Other Topics Concern   Not on file  Social History Narrative   Lives with daughter Rosyln Rapoza)    Right handed   Caffeine: none    Social Determinants of Health   Financial Resource Strain: Low Risk  (05/12/2022)   Overall Financial Resource Strain (CARDIA)    Difficulty of Paying Living Expenses: Not hard at all  Food Insecurity: No Food Insecurity (05/12/2022)   Hunger Vital Sign    Worried About Running Out of Food in the Last Year: Never true    Ran Out of Food in the Last Year: Never true  Transportation Needs: No Transportation Needs (05/12/2022)   PRAPARE - Administrator, Civil Service (Medical): No    Lack of Transportation (Non-Medical): No  Physical Activity: Insufficiently Active (05/12/2022)   Exercise Vital Sign    Days of Exercise per Week: 3 days    Minutes of Exercise per Session: 40 min  Stress: No Stress Concern Present (05/12/2022)   [employer] of Occupational Health - Occupational Stress Questionnaire    Feeling of Stress : Only a little  Social Connections: Moderately Integrated (05/12/2022)   Social Connection and Isolation Panel [NHANES]    Frequency of Communication with Friends and Family: More than three times a week    Frequency of Social Gatherings with Friends and Family: More than three times a week    Attends Religious Services: More than 4 times per year    Active Member of [employer] or Organizations: Yes    Attends Engineer, structural: More than 4 times per year    Marital Status: Divorced  Intimate Partner Violence: Not At Risk (05/12/2022)   Humiliation, Afraid, Rape, and Kick questionnaire    Fear of Current  or Ex-Partner: No    Emotionally Abused: No    Physically Abused: No    Sexually Abused: No     PHYSICAL EXAM  There were no vitals filed for this visit.   There is no height or weight on file to calculate BMI.  Generalized: Well developed, in no acute distress  Cardiology: normal rate and rhythm, no murmur auscultated  Respiratory: clear to auscultation bilaterally    Neurological examination  Mentation: Alert oriented to time, place, history taking. Follows all commands speech and language fluent Cranial nerve II-XII: Pupils were equal round reactive to light. Extraocular movements were full, visual field were full on confrontational test. Very mild horizontal saccadic movements. No nystagmus noted. Facial sensation and strength were normal. Head turning and shoulder shrug  were normal and symmetric. Motor: The motor testing reveals 5 over 5 strength of  all 4 extremities. Good symmetric motor tone is noted throughout. No evidence of tremor on exam.  Sensory: Sensory testing is intact to soft touch on all 4 extremities. No evidence of extinction is noted.  Coordination: Cerebellar testing reveals good finger-nose-finger and heel-to-shin bilaterally.  Gait and station: Gait is normal.  Reflexes: Deep tendon reflexes are symmetric and normal bilaterally.    DIAGNOSTIC DATA (LABS, IMAGING, TESTING) - I reviewed patient records, labs, notes, testing and imaging myself where available.  Lab Results  Component Value Date   WBC 6.1 11/03/2022   HGB 9.4 (L) 11/03/2022   HCT 29.5 (L) 11/03/2022   MCV 90.5 11/03/2022   PLT 91 (L) 11/03/2022      Component Value Date/Time   NA 134 (L) 11/03/2022 0122   NA 140 10/04/2021 1101   K 5.0 11/03/2022 0122   CL 106 11/03/2022 0122   CO2 18 (L) 11/03/2022 0122   GLUCOSE 101 (H) 11/03/2022 0122   BUN 18 11/03/2022 0122   BUN 23 10/04/2021 1101   CREATININE 1.82 (H) 11/03/2022 0122   CREATININE 1.71 (H) 07/05/2018 1231   CREATININE  1.10 (H) 01/09/2017 1130   CALCIUM 8.4 (L) 11/03/2022 0122   PROT 7.7 02/16/2022 2108   PROT 6.8 09/21/2021 0803   ALBUMIN 4.6 02/16/2022 2108   ALBUMIN 4.4 09/21/2021 0803   AST 21 02/16/2022 2108   AST 20 07/05/2018 1231   ALT 15 02/16/2022 2108   ALT 12 07/05/2018 1231   ALKPHOS 48 02/16/2022 2108   BILITOT 0.7 02/16/2022 2108   BILITOT 0.3 09/21/2021 0803   BILITOT 0.7 07/05/2018 1231   GFRNONAA 30 (L) 11/03/2022 0122   GFRNONAA 30 (L) 07/05/2018 1231   GFRNONAA 54 (L) 01/09/2017 1130   GFRAA 39 (L) 12/06/2019 1045   GFRAA 35 (L) 07/05/2018 1231   GFRAA 62 01/09/2017 1130   Lab Results  Component Value Date   CHOL 196 09/21/2021   HDL 69 09/21/2021   LDLCALC 111 (H) 09/21/2021   TRIG 92 09/21/2021   CHOLHDL 2.8 09/21/2021   Lab Results  Component Value Date   HGBA1C 6.1 (H) 02/17/2022   Lab Results  Component Value Date   VITAMINB12 >1550 (H) 02/11/2021   Lab Results  Component Value Date   TSH 1.550 11/28/2022        No data to display               No data to display           ASSESSMENT AND PLAN  72 y.o. year old female  has a past medical history of Anxiety (05/18/2018), Asthma, Chronic anemia (06/29/2016), Chronic pain of both knees (02/15/2019), CKD stage 3 due to type 2 diabetes mellitus (HCC) (05/18/2018), Coronary artery disease (10/01/2018), Diabetes mellitus without complication (HCC), Dyslipidemia associated with type 2 diabetes mellitus (HCC) (05/18/2018), Gastroesophageal reflux disease without esophagitis (06/18/2015), Herniated intervertebral disc of lumbar spine (05/01/2017), Hypertension, Hypertension associated with diabetes (HCC) (05/13/2015), IBS (irritable bowel syndrome), Insomnia (05/13/2015), Migraine headache (05/13/2015), Moderate episode of recurrent major depressive disorder (HCC) (01/04/2017), Osteoarthritis of spine (02/03/2017), Transient ischemic attack (TIA) (06/28/2016), and Type 2 diabetes mellitus with neurological complications  (HCC) (05/13/2015). here with    No diagnosis found.  Florencia Reasons reports chronic dizziness remains problematic. She continues to work with PT/OT but has not felt this was beneficial. We have discussed multiple likely contributing factors with old CVA, white matter changes, medication side effects, migraines, etc. MRI in 2019 was  stable, however, abnormal eye movements are fairly new. I will update imaging. MR brain and orbits without contrast due to CKD. Last creatine on file 2.22. She was advised to continue PT/OT and update eye exam asap. Tremor seems stable. I do not feel additional medication management would be helpful at this time. No parkinsonian concerns with exam or history. Migraines seem well managed. We will continue Emgality and topiramate 50mg  in am and 100mg  in pm. Continue Tylenol for abortive care. She was encouraged to talk with PCP about allergy treatment options. Healthy lifestyle habits encouraged. She will follow up with PCP as directed for stroke prevention, disease management and evaluation of anemia. She will return to see Dr Lucia Gaskins per her request and to review notes and imaging. I anticipate she will follow up with me, thereafter. She verbalizes understanding and agreement with this plan.   No orders of the defined types were placed in this encounter.    No orders of the defined types were placed in this encounter.    [name], MSN, FNP-C 05/08/2023, 4:53 PM  [facility] [address] [place], [place] [address] [phone]

## 2023-05-08 NOTE — Patient Instructions (Incomplete)

## 2023-05-10 ENCOUNTER — Encounter: Payer: Self-pay | Admitting: Family Medicine

## 2023-05-10 ENCOUNTER — Ambulatory Visit: Payer: Medicare Other | Admitting: Family Medicine

## 2023-05-10 VITALS — BP 129/90 | HR 59 | Ht 67.0 in | Wt 165.0 lb

## 2023-05-10 DIAGNOSIS — R42 Dizziness and giddiness: Secondary | ICD-10-CM | POA: Diagnosis not present

## 2023-05-10 DIAGNOSIS — G43101 Migraine with aura, not intractable, with status migrainosus: Secondary | ICD-10-CM

## 2023-05-10 DIAGNOSIS — W19XXXD Unspecified fall, subsequent encounter: Secondary | ICD-10-CM

## 2023-05-10 DIAGNOSIS — R55 Syncope and collapse: Secondary | ICD-10-CM

## 2023-05-15 ENCOUNTER — Encounter: Payer: Medicare Other | Admitting: Cardiovascular Disease

## 2023-05-15 ENCOUNTER — Ambulatory Visit: Payer: Medicare Other | Attending: Cardiovascular Disease | Admitting: Cardiovascular Disease

## 2023-05-15 ENCOUNTER — Encounter: Payer: Self-pay | Admitting: Cardiovascular Disease

## 2023-05-15 VITALS — BP 150/100 | HR 66 | Ht 67.0 in | Wt 162.0 lb

## 2023-05-15 DIAGNOSIS — R55 Syncope and collapse: Secondary | ICD-10-CM | POA: Diagnosis not present

## 2023-05-15 DIAGNOSIS — R079 Chest pain, unspecified: Secondary | ICD-10-CM | POA: Diagnosis not present

## 2023-05-15 DIAGNOSIS — I2510 Atherosclerotic heart disease of native coronary artery without angina pectoris: Secondary | ICD-10-CM

## 2023-05-15 NOTE — Patient Instructions (Signed)

## 2023-05-15 NOTE — Progress Notes (Signed)
Electrophysiology Office Note:    Date:  05/15/2023   ID:  Julie Gilbert, Julie Gilbert [birth year], MRN [id number]  PCP:  Salli Real, MD   [facility] Providers Cardiologist:  Chilton Si, MD     Referring MD: Salli Real, MD   History of Present Illness:    Julie Gilbert is a 72 y.o. female with a hx listed below, significant for non-obstructive CAD, TIA, CKD 3, DM , HTN, HLD, referred for to discuss ILR for recurrent syncope.  She has had multiple syncopal events since she was a child.  It seems like occur in clusters about every 7 years.  When she is experiencing episodes, they tend to occur less frequently than once a month.  She cannot identify a specific trigger, but she has had episodes occurred while she was on the toilet.  The most recent was in late February of this year. A family member found her unconscious on the floor.  CPR was performed briefly, patient woke up.  She presents today to discuss removal of her loop recorder.  She has not quite sure why she has it or what it is doing for her.   Past Medical History:  Diagnosis Date   Anxiety 05/18/2018   Asthma    Chronic anemia 06/29/2016   Chronic pain of both knees 02/15/2019   CKD stage 3 due to type 2 diabetes mellitus (HCC) 05/18/2018   Coronary artery disease 10/01/2018   Diabetes mellitus without complication (HCC)    Dyslipidemia associated with type 2 diabetes mellitus (HCC) 05/18/2018   Lab Results Component Value Date  CHOL 198 08/17/2018  HDL 62.40 08/17/2018  LDLCALC 113 (H) 08/17/2018  TRIG 113.0 08/17/2018  CHOLHDL 3 08/17/2018     Gastroesophageal reflux disease without esophagitis 06/18/2015   Herniated intervertebral disc of lumbar spine 05/01/2017   Hypertension    Hypertension associated with diabetes (HCC) 05/13/2015   IBS (irritable bowel syndrome)    Insomnia 05/13/2015   Migraine headache 05/13/2015   Moderate episode of recurrent major depressive disorder (HCC) 01/04/2017   Osteoarthritis of  spine 02/03/2017   Transient ischemic attack (TIA) 06/28/2016   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     Allergies:   Peanuts [peanut oil], Shrimp [shellfish allergy], Latex, Elemental sulfur, Iodinated contrast media, Other, Penicillins, Pineapple, Repatha [evolocumab], and Sulfa antibiotics   Social and Family History: Reviewed in Epic  ROS:   Please see the history of present illness.    All other systems reviewed and are negative.  EKGs/Labs/Other Studies Reviewed Today:    Echocardiogram:  TTE 12/28/22 EF 45-50%    EKG:  Last EKG results: 02/16/2022 Sinus bradycardia   Recent Labs: 11/03/2022: BUN 18; Creatinine, Ser 1.82; Hemoglobin 9.4; Platelets 91; Potassium 5.0; Sodium 134 11/28/2022: TSH 1.550     Physical Exam:    VS:  LMP  (LMP Unknown) Comment: Menopausal    Wt Readings from Last 3 Encounters:  05/10/23 165 lb (  74.8 kg)  05/01/23 164 lb (74.4 kg)  01/02/23 160 lb 3.2 oz (72.7 kg)     GEN: Well nourished, well developed in no acute distress CARDIAC: RRR, no murmurs, rubs, gallops RESPIRATORY:  Normal work of breathing MUSCULOSKELETAL: no edema    ASSESSMENT & PLAN:    Recurrent syncope Episodes are infrequent, occurring less than once a month.  Would be unlikely to detect an episode with traditional wearable monitors. Possibly vasovagal.  She has no prodrome.   She has a loop recorder in place, monitoring I again explained the purpose of  the loop recorder and informed her that it is functioning properly.  She now wants to keep the device and continue monitoring in the event she has another syncopal episode..          Medication Adjustments/Labs and Tests Ordered: Current medicines are reviewed at length with the patient today.  Concerns regarding medicines are outlined above.  No orders of the defined types were placed in this encounter.  No orders of the defined types were placed in this encounter.    Signed, Maurice Small, MD  05/15/2023 8:31 AM    [facility]

## 2023-05-16 ENCOUNTER — Telehealth: Payer: Self-pay

## 2023-05-16 NOTE — Telephone Encounter (Signed)
I told the patient that I would order the her a new handheld for the new monitor I gave her yesterday.

## 2023-05-17 NOTE — Telephone Encounter (Signed)
Handheld was ordered. She should receive it in 7-10 business days.

## 2023-05-18 LAB — CUP PACEART REMOTE DEVICE CHECK
Date Time Interrogation Session: 20240821231405
Implantable Pulse Generator Implant Date: 20240408

## 2023-05-22 ENCOUNTER — Ambulatory Visit (INDEPENDENT_AMBULATORY_CARE_PROVIDER_SITE_OTHER): Payer: Medicare Other

## 2023-05-22 DIAGNOSIS — R55 Syncope and collapse: Secondary | ICD-10-CM | POA: Diagnosis not present

## 2023-05-31 NOTE — Progress Notes (Signed)
Carelink Summary Report / Loop Recorder 

## 2023-06-06 ENCOUNTER — Telehealth: Payer: Self-pay | Admitting: Family Medicine

## 2023-06-06 MED ORDER — TOPIRAMATE 50 MG PO TABS: ORAL_TABLET | ORAL | 3 refills | Status: AC

## 2023-06-06 NOTE — Telephone Encounter (Signed)
Topamax refill sent to [facility] as requested.

## 2023-06-06 NOTE — Telephone Encounter (Signed)
Pt request refill for topiramate (TOPAMAX) 50 MG tablet sent to [facility] [phone] -

## 2023-06-23 LAB — CUP PACEART REMOTE DEVICE CHECK
Date Time Interrogation Session: 20240923231621
Implantable Pulse Generator Implant Date: 20240408

## 2023-06-26 ENCOUNTER — Ambulatory Visit (INDEPENDENT_AMBULATORY_CARE_PROVIDER_SITE_OTHER): Payer: Medicare Other

## 2023-06-26 DIAGNOSIS — R55 Syncope and collapse: Secondary | ICD-10-CM

## 2023-07-10 NOTE — Progress Notes (Signed)
Carelink Summary Report / Loop Recorder 

## 2023-07-20 ENCOUNTER — Ambulatory Visit
Admission: RE | Admit: 2023-07-20 | Discharge: 2023-07-20 | Disposition: A | Discharge status: Home / Self Care | Payer: Medicare Other | Source: Ambulatory Visit | Attending: Obstetrics and Gynecology | Admitting: Obstetrics and Gynecology

## 2023-07-20 DIAGNOSIS — N6489 Other specified disorders of breast: Secondary | ICD-10-CM

## 2023-07-23 ENCOUNTER — Other Ambulatory Visit: Payer: Self-pay | Admitting: Neurology

## 2023-07-23 DIAGNOSIS — H8110 Benign paroxysmal vertigo, unspecified ear: Secondary | ICD-10-CM

## 2023-07-25 ENCOUNTER — Encounter: Payer: Self-pay | Admitting: Neurology

## 2023-07-25 ENCOUNTER — Telehealth: Payer: Self-pay | Admitting: *Deleted

## 2023-07-25 LAB — CUP PACEART REMOTE DEVICE CHECK
Date Time Interrogation Session: 20241026231512
Implantable Pulse Generator Implant Date: 20240408

## 2023-07-25 NOTE — Telephone Encounter (Signed)
Rx refilled per last office visit note.

## 2023-07-25 NOTE — Telephone Encounter (Signed)
Received message from AL,NP: "Can you verify with patient that she does not wish to have imaging as recommended by Dr Lucia Gaskins and myself? Imagine ordered but has not been scheduled. "   I called pt. She forgot about these tests that were ordered. However, she has loop recorder. Asked if she can still get test completed. She will send medical card detailing loop recorder via mychart message.

## 2023-07-26 NOTE — Telephone Encounter (Signed)
I cancelled the other two scans so that the schedulers at the hospital know what to schedule. I forwarded the loop recorder information to the schedulers at [facility] to check if it is MRI compatible and asked them to get her scheduled if it is. [phone]

## 2023-07-26 NOTE — Telephone Encounter (Signed)
Are we still wanting all 4 scans to be done?

## 2023-07-26 NOTE — Telephone Encounter (Signed)
Pt uploaded info via mychart. Toma Copier, RN sent to [facility] to check compatibility with MRI, waiting on response.

## 2023-07-31 ENCOUNTER — Ambulatory Visit (INDEPENDENT_AMBULATORY_CARE_PROVIDER_SITE_OTHER): Payer: Medicare Other

## 2023-07-31 DIAGNOSIS — R55 Syncope and collapse: Secondary | ICD-10-CM

## 2023-08-21 NOTE — Progress Notes (Signed)
Carelink Summary Report / Loop Recorder 

## 2023-08-22 ENCOUNTER — Other Ambulatory Visit: Payer: Self-pay | Admitting: *Deleted

## 2023-08-22 MED ORDER — ROSUVASTATIN CALCIUM 10 MG PO TABS: 10.0000 mg | ORAL_TABLET | Freq: Every day | ORAL | 3 refills | Status: AC

## 2023-09-04 ENCOUNTER — Ambulatory Visit (INDEPENDENT_AMBULATORY_CARE_PROVIDER_SITE_OTHER): Payer: Medicare Other

## 2023-09-04 ENCOUNTER — Encounter (HOSPITAL_BASED_OUTPATIENT_CLINIC_OR_DEPARTMENT_OTHER): Payer: Self-pay

## 2023-09-04 ENCOUNTER — Ambulatory Visit (HOSPITAL_BASED_OUTPATIENT_CLINIC_OR_DEPARTMENT_OTHER): Payer: Medicare Other | Admitting: Family

## 2023-09-04 DIAGNOSIS — R55 Syncope and collapse: Secondary | ICD-10-CM | POA: Diagnosis not present

## 2023-09-04 LAB — CUP PACEART REMOTE DEVICE CHECK
Date Time Interrogation Session: 20241208232106
Implantable Pulse Generator Implant Date: 20240408

## 2023-10-05 ENCOUNTER — Telehealth: Payer: Self-pay | Admitting: *Deleted

## 2023-10-05 ENCOUNTER — Telehealth: Payer: Self-pay | Admitting: Family Medicine

## 2023-10-05 DIAGNOSIS — G43709 Chronic migraine without aura, not intractable, without status migrainosus: Secondary | ICD-10-CM

## 2023-10-05 MED ORDER — EMGALITY 120 MG/ML ~~LOC~~ SOAJ: SUBCUTANEOUS | 2 refills | Status: DC

## 2023-10-05 NOTE — Telephone Encounter (Signed)
 Patient said PA for EMGALITY 120 MG/ML SOAJ

## 2023-10-05 NOTE — Telephone Encounter (Signed)
 Pt requesting refill and PA for EMGALITY 120 MG/ML SOAJ  [facility] to [facility] [phone]

## 2023-10-05 NOTE — Telephone Encounter (Signed)
 Last seen on 05/10/23 Follow up scheduled on 12/04/23 Rx sent  Another encounter sent to PA team to start Prior Auth.

## 2023-10-06 ENCOUNTER — Other Ambulatory Visit (HOSPITAL_COMMUNITY): Payer: Self-pay

## 2023-10-09 ENCOUNTER — Ambulatory Visit (INDEPENDENT_AMBULATORY_CARE_PROVIDER_SITE_OTHER): Payer: Medicare Other

## 2023-10-09 DIAGNOSIS — R55 Syncope and collapse: Secondary | ICD-10-CM | POA: Diagnosis not present

## 2023-10-09 LAB — CUP PACEART REMOTE DEVICE CHECK
Date Time Interrogation Session: 20250112232847
Implantable Pulse Generator Implant Date: 20240408

## 2023-10-10 ENCOUNTER — Telehealth: Payer: Self-pay

## 2023-10-10 ENCOUNTER — Other Ambulatory Visit (HOSPITAL_COMMUNITY): Payer: Self-pay

## 2023-10-10 NOTE — Telephone Encounter (Signed)
     I was asked to do a PA however there is an active PA on file and the test claim came back as refill too soon-It was filled on 09/28/2023, next fill due on 10/21/2023

## 2023-10-12 ENCOUNTER — Telehealth: Payer: Self-pay

## 2023-10-12 NOTE — Telephone Encounter (Signed)
Alert received from [employer] for 2 brady episodes, no rhythm strips.  Attempted to call patient, no answer. LMTCB.   Please have patient send manual transmission when she calls back.

## 2023-10-13 NOTE — Telephone Encounter (Signed)
LM on patients VM to call the office.  Was able to get daughter on her phone.   She states that mother is in shower now, but will have her transmit later when she gets out.  They have steps to send a transmission and have tech services number if any issues. Will look for transmission on Monday.

## 2023-10-16 NOTE — Telephone Encounter (Signed)
2 Brady events, both previously reported without EGMs to view at that time. Both on 1/1, longest 20 sec with avg V-rates 34-40 bpm.  At times rates <30 bpm.  On zoom EGM, P waves are typically not visible.  Times of SB and junctional rhythm.    Attempted to contact patient & daughter with no answer to any numbers. LMTCB.

## 2023-10-16 NOTE — Telephone Encounter (Signed)
Follow Up:      Patient is returning a call, she did not know who called her.

## 2023-10-16 NOTE — Telephone Encounter (Signed)
Patient returned RN's call. 

## 2023-10-16 NOTE — Telephone Encounter (Signed)
Spoke with patient's daughter.  She states that mom was definitely asleep during ath 9am time period.  They do not recall anything other than that out of the ordinary.  Will forward to the provider to see if any thing other than continued monitoring. Patient's dter aware we will only call back if anything further to not from provider.

## 2023-10-19 NOTE — Telephone Encounter (Signed)
Called pt back. Relayed PA on file. She states cost higher. I suggested she speak with pharmacy, insurance to see why. She may have deductible to meet for the new yr. She verbalized understanding.

## 2023-10-19 NOTE — Telephone Encounter (Signed)
Pt called stating that she still has not received her Emgality or a call back from a nurse. Please advise.

## 2023-10-24 ENCOUNTER — Ambulatory Visit (INDEPENDENT_AMBULATORY_CARE_PROVIDER_SITE_OTHER): Payer: Medicare Other | Admitting: Podiatry

## 2023-10-24 DIAGNOSIS — Z91199 Patient's noncompliance with other medical treatment and regimen due to unspecified reason: Secondary | ICD-10-CM

## 2023-10-24 NOTE — Progress Notes (Signed)
1. No-show for appointment

## 2023-10-28 ENCOUNTER — Other Ambulatory Visit: Payer: Self-pay | Admitting: Neurology

## 2023-10-28 DIAGNOSIS — H8110 Benign paroxysmal vertigo, unspecified ear: Secondary | ICD-10-CM

## 2023-11-13 ENCOUNTER — Ambulatory Visit (INDEPENDENT_AMBULATORY_CARE_PROVIDER_SITE_OTHER): Payer: Medicare Other

## 2023-11-13 DIAGNOSIS — R55 Syncope and collapse: Secondary | ICD-10-CM

## 2023-11-14 LAB — CUP PACEART REMOTE DEVICE CHECK
Date Time Interrogation Session: 20250216233446
Implantable Pulse Generator Implant Date: 20240408

## 2023-11-20 NOTE — Progress Notes (Signed)
 [facility] Summary Report / Loop Recorder

## 2023-11-21 ENCOUNTER — Encounter: Payer: Self-pay | Admitting: Cardiology

## 2023-11-29 NOTE — Progress Notes (Deleted)
 No chief complaint on file.   HISTORY OF PRESENT ILLNESS:  11/29/23 ALL:  Julie Gilbert returns for follow up for migraines, dizziness and syncopal/seizure like episodes. She was last seen 04/2023. Migrraines were well managed on Emgality and topiramate. Dizziness continued to be bothersome and she was encouraged to schedule MR cervical spine and MRA head as previously ordered by Dr Lucia Gaskins.   Since,   Nurtec and ondansetron work well for abortive therapy.   Loop recorder   05/10/2023 ALL:  Julie Gilbert returns for follow up for migraines, dizziness and syncopal/seizure like episodes. I saw her last 06/2022 following concerns from PT of abnormal ocular movements on exam. MRI brian and orbits ordered but not completed. She was last seen by Dr Lucia Gaskins 11/2022. Headaches were well managed on Emgality and topiramate 50/100. Nurtec and ondansetron used for abortive therapy. CT was unremarkable 10/2022. MRI brain, MRA and MR cervical spine ordered but have not been completed. Ambulatory EEG normal 11/2022. 72 hour EEG ordered with [facility]. Normal results read by Dr Teresa Coombs 03/2023. She is being followed by cardiology for concerns of syncope. She has an ILR. No arrhythmias so far. She is planning to repeat ECHO 12/2023.   Since, she reports doing fairly well. She continues to have some headaches. They usually are well managed on Emgality and topramate. Nausea is better with scopolamine patches. Migraines may be a little worse recently due to weather. Nurtec works well for abortive therapy. She is taking meclizine 25mg  TID. She continues to feel vertigo is debilitating. Significant dizziness with turning head. Occasionally she feels dizzy with position changes. She reports not hearing back to schedule imaging. She has not called to inquire. She did not feel PT was helpful. She has had multiple falls. She uses her cane all the time.   11/28/2022 AA:  Since being seen end of January doing better on migraines  and better with vertigo. Hit your head, just fell backwards.She was walking to the fridge she collapsed back. She was "out for 5 minutes" had to rescuscutate. Her eyes rolled in the back of the head, eyes open, BP very low, said she was dehydrated but drinking plenty of water, no more episodes since then, mother did NOT feel she was going out, eyes open, rolled back, she wasn't reponding, she was confused when she cme to but recognized daughter, no focal weakness, wasn't breathing, no heartbeat, daughter was literllay doing CPR unclear though if, patient went completelt darknheaded, nor=t lightheaded, no warning, after a few minutes she was fine. She couldn't stand because of dizziness. This was years ago, she went to cardiologist years ago. She sees [name] need to get her an appointment, blood pressure per daughter was 98/70 but in the ED it was higher but unclear if this was after fluids. 8 glasses a day. Make sure to include electrolytes like gatorade so we don;t get hypona+ etc discussed with daughter who proc=vided most information. She has anemia but has been to hematology, she has CKD so could be anemia due to that see [name]. Hit head so hard daughter heard it. Vertigo is constant, dizziness and she has it right now.    Personally reviewed images.    CT head 10/2022: TECHNIQUE: Contiguous axial images were obtained from the base of the skull through the vertex without intravenous contrast.   RADIATION DOSE REDUCTION: This exam was performed according to the departmental dose-optimization program which includes automated exposure control, adjustment of the mA and/or kV  according to patient size and/or use of iterative reconstruction technique.   COMPARISON:  Head CT dated 09/27/2017.   FINDINGS: Brain: The ventricles and sulci are appropriate size for patient's age. 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 There is no acute intracranial hemorrhage. No  mass effect or midline shift. No extra-axial fluid collection.   Vascular: No hyperdense vessel or unexpected calcification.   Skull: Normal. Negative for fracture or focal lesion.   Sinuses/Orbits: No acute finding.   Other: None   IMPRESSION: 1. No acute intracranial pathology. 2. 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  10/25/2022 AA:   This is a 73 year old patient we have seen in the past for multiple neurologic symptoms including migraines and lacunar infarcts.  Repeat MRI of the brain and orbits was ordered in October by my NP but does not look like they were completed.  We initially saw her for migraines past medical history migraine, anxiety, asthma, chronic kidney disease, lacunar infarcts, coronary artery disease, diabetes, dyslipidemia, hypertension, depression, osteoarthritis, B12 deficiency and vertigo.  Patient has several specialists following her including cardiology and when we first met her she was on a heart monitor.  We placed her on Emgality and Topamax which seemed to help prior to that she had daily headaches and treatment decreased it to 6 headache days a month.  She has a family history of tremors with action and posture and we kept her on Topamax and Emgality and she declined Botox.  [name] made her sick.  But we have kept her on Emgality which has worked.  She was started on Repatha in the past, [name] did not work, we kept her on [employer] and Topamax.  Barely 2  total headaches a month on Emgaliy aborted by Tylenol.  She is also lost weight with diet changes.  She was not interested in [place] or Botox.  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  At that time she continued to work with PT for dizziness and  vertigo and she was doing better.  Followed by [name] at [facility].  Using Flexeril and pregabalin and meclizine 3 times daily.  She was weaned off clonazepam and and has insomnia as well.  We recommended an eye exam.  At that time she reported about 4 migraine days in headache days per month doing great on Emgality.  She is followed by [name] last A1c 6.1 last LDL 111 and she continued aspirin 81 mg and atorvastatin and Repatha.  Sleep study was normal.  Tremors continue not at rest.  She continues to try to hydrate.   She has had a 2 week migraines but usually doing excellent on Emgality and only has 4 migraines a month will try nurtec acutely. She did miss her emgality. The weather and stress has affected her. Last emgality was Dec 28th so due for emgality now and her prescription. Put the prescription on autofill and as soon as its ready take it. [name] did not work. We discussed continuing emgality and topamax but she needs a good acute management medication, triptans contraindicated due to strokes will try and get her nurtec. Take her to iv infusion today for status migrainosus.  Patient complains of symptoms per HPI as well as the following symptoms: status migrainosus . Pertinent negatives and positives per HPI. All others negative  07/25/2022 ALL: Julie Gilbert returns for an acute visit. She has suffered from vertigo for years. She was referred to PT by Dr Lucia Gaskins. 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   She continues to work with  PT. She reports that dizziness remains constant. Her daughter feels that she is doing better. She missed visits last week due to back pain. She is followed by Dr Wynelle Link with [facility]. She is treated with cyclobenzaprine and pregabalin. She uses meclizine TID. She was weaned off clonazepam (anxiety) and started on Ambien 10mg  for insomnia. She denies vision loss. No double vision. She does have blurred vision with headaches only. She occasionally has left eye pain with migraines. She has not had an eye exam recently.   She continues Emgality monthly and topiramate 50mg  in am and 100mg  in pm for migraine prevention. She reports having about 4 headache days per month. Rest will abort headache. She feels that weather changes are most common trigger.   She is followed regularly by Dr Wynelle Link. Last A1C 6.1. Last LDL 111. She continues asa 81mg  and atorvastatin 40mg . She reported side effects with Repatha. She reports being told to take iron supplements last week. She reports low Hgb. Last hgb 11. She reports blood work was updated with Dr Wynelle Link last week. She is being set up for a colonoscopy. She reports not sleeping well despite taking Ambien 10mg  daily. She reports sleep study was normal. She does not snore. She continues to have a tremor. Worse on left hand but can be both. Tremor is worse with activity. If sitting still, no tremor. She reports drinking at least 80oz of water daily.   04/19/2022 ALL: Julie Gilbert is a 73 y.o. female here today for follow up for migraines. She continues emgality and topiramate 50mg  in am and 100mg  at bedtime. She reports that headaches wax and wane. She has severe allergies and reports this contributed to some months of worsening. She has had really bad allergies this year. She can't breath well in the hot weather. She reports that headaches are much better on Emgality and topiramate. Most months she has 1-2 headaches days easily aborted with Tylenol and rest. She has lost 30lbs  with diet changes. She does report that injection seems to sting a little more now. She is not interested in [place] due to fears of worsening constipation. She tries to drink 4 bottles of water daily.   HISTORY (copied from Dr Trevor Mace previous note)  10/19/2021: Emgality has been working great. [name] made her sick. She has been doing excellent on the [employer]. She was a little dizzy on standing, her BP slightly elevated, we had to get her a wheelchair. Today she feels bad and has been feeling bad since the repatha. [name] does not work when she has a migraine. She stayed on her Topamax and she was doing well. Patient not feeling well after taking repatha, looks ill today, says she has been feeling unwell  since taking it, her BP is slightly elevated, she appears to be tremulous, I called [place] and they have an 8 hour wait. [facility] and no wait time but I was able to call her pcp Dr. Laural Benes and got her an appointment at 330. Daughter is here as well and provides information, she has not been feeling well since Repatha. She has been doing fantastic on Emgality, barely 2 total headaches a month   HPI 05/20/2021:  Julie Gilbert is a 73 y.o. female here as requested by Ardith Dark, MD for migraines. PMHx migraine, anxiety, asthma, chronic kidney disease, coronary artery disease, diabetes, dyslipidemia, hypertension, depression, osteoarthritis, lacunar stroke, B12 deficiency, vertigo. Here with dauhter who also provides information. She is doing a lot better with her migraines. She has a cardiologist. She is on a heart monitor. Her migraines cn be severe in the left side back and side, pulsating/pounding/throbbing/, pulsating/pounding/throbbing, photophobia/phonophobia, most severe pain is the back in the left, the Emgality helps a lot, she is also on Topamax. Extremely severe. Nausea, she would pass out it was so severe. Used to have daily headaches, weather can be a trigger, a lot less maybe about 6  a month, was not able to get the [place].  Mother had tremors. With action and posture. Order [name] for acute management. Preventative Topamax and emgality. Discussed botox.No other focal neurologic deficits, associated symptoms, inciting events or modifiable factors.   Reviewed notes, labs and imaging from outside physicians, which showed:   From a thorough review of records, medications tried that can be used in migraine management include: Amlodipine (calcium channel blocker similar to verapamil), aspirin, Fioricet, Voltaren tablet, Benadryl, Emgality, Lexapro, gabapentin, meclizine, melatonin, Reglan injections, naproxen, Zofran, prednisone tablets, Phenergan tablets, Imitrex, Topamax, trazodone, propranolol contraindicated due to asthma, amitriptyline/nortrio contraindicated due to being on lexapro, TRIPTANS CONTRAINDICATED due to strokes   REVIEW OF SYSTEMS: Out of a complete 14 system review of symptoms, the patient complains only of the following symptoms, headaches, vertigo, falls, seasonal allergies, constipation and all other reviewed systems are negative.   ALLERGIES: Allergies  Allergen Reactions   Peanuts [Peanut Oil] Anaphylaxis   Shrimp [Shellfish Allergy] Anaphylaxis   Latex Hives   Elemental Sulfur Itching and Swelling   Iodinated Contrast Media Hives    Pt reported she had hives after recent procedure w/ CT contrast.    Other Other (See Comments)    Adhesive on EKG pads irritated skin   Penicillins Hives    Has patient had a PCN reaction causing immediate rash, facial/tongue/throat swelling, SOB or lightheadedness with hypotension: No Has patient had a PCN reaction causing severe rash involving mucus membranes or skin necrosis: No Has patient had a PCN reaction that required hospitalization: No Has patient had a PCN reaction occurring within the last 10 years: No  If all of the above answers are "NO", then may proceed with Cephalosporin use.     Pineapple Swelling  and Other (See Comments)    Scratchy throat, Tongue swelling   Repatha [Evolocumab] Other (See Comments)    Dizzy, scratchy eyes/throat, palpitations   Sulfa Antibiotics Hives     HOME MEDICATIONS: Outpatient Medications Prior to Visit  Medication Sig Dispense Refill   Albuterol-Budesonide 90-80 MCG/ACT AERO Inhale 2 puffs into the lungs daily.     Ascorbic Acid (VITAMIN C) 1000 MG tablet Take 1,000 mg by mouth at bedtime.     aspirin EC 81 MG tablet Take 81 mg by mouth every morning. Swallow  whole.     Blood Glucose Monitoring Suppl (ACCU-CHEK AVIVA PLUS) w/Device KIT Use as directed to check blood sugar once daily. E11.9 1 kit 0   Blood Pressure Monitoring (BLOOD PRESSURE KIT) DEVI 1 application by Does not apply route daily. Use to check blood pressure daily 1 Device 0   cholecalciferol (VITAMIN D3) 25 MCG (1000 UNIT) tablet Take 1,000 Units by mouth at bedtime.     COD LIVER OIL PO Take 1-2 capsules by mouth See admin instructions. Take one capsule by mouth every morning and take two capsules at night     cyclobenzaprine (FLEXERIL) 10 MG tablet Take 10 mg by mouth 3 (three) times daily as needed.     EMGALITY 120 MG/ML SOAJ Inject 120 ml monthly. 1 mL 2   escitalopram (LEXAPRO) 20 MG tablet Take 20 mg by mouth daily.     fluticasone (FLONASE) 50 MCG/ACT nasal spray PLACE 1 SPRAY INTO BOTH NOSTRILS DAILY AS NEEDED FOR ALLERGIES OR RHINITIS. 48 mL 3   GARLIC PO Take 1 tablet by mouth at bedtime. (Patient not taking: Reported on 05/15/2023)     glucose blood (ACCU-CHEK AVIVA PLUS) test strip Use as instructed to check blood sugar once daily. E11.9 100 each 12   Lancets (ACCU-CHEK SOFT TOUCH) lancets Use as instructed to check blood sugar once daily E11.9 100 each 12   losartan (COZAAR) 25 MG tablet TAKE 1 TABLET BY MOUTH EVERY DAY 90 tablet 3   meclizine (ANTIVERT) 25 MG tablet TAKE 1 TABLET BY MOUTH THREE TIMES A DAY 270 tablet 0   Multiple Vitamin (MULTIVITAMIN WITH MINERALS) TABS tablet  Take 1 tablet by mouth at bedtime.     Omega-3 Fatty Acids (FISH OIL PO) Take 1 capsule by mouth at bedtime.     ondansetron (ZOFRAN-ODT) 4 MG disintegrating tablet Take 1-2 tablets (4-8 mg total) by mouth every 8 (eight) hours as needed. 30 tablet 3   pantoprazole (PROTONIX) 40 MG tablet Take 40 mg by mouth every morning.     pregabalin (LYRICA) 50 MG capsule 50 mg as needed.     Rimegepant Sulfate (NURTEC) 75 MG TBDP Take 1 tablet (75 mg total) by mouth daily as needed. For migraines. Take as close to onset of migraine as possible. One daily maximum. 16 tablet 11   rosuvastatin (CRESTOR) 10 MG tablet Take 1 tablet (10 mg total) by mouth daily. 90 tablet 3   scopolamine (TRANSDERM-SCOP) 1 MG/3DAYS PLACE 1 PATCH ONTO THE SKIN EVERY 3 DAYS. 10 patch 0   scopolamine (TRANSDERM-SCOP) 1 MG/3DAYS PLACE 1 PATCH ONTO THE SKIN EVERY 3 DAYS. (Patient not taking: Reported on 05/15/2023) 10 patch 0   topiramate (TOPAMAX) 50 MG tablet Take 1 tablet (50 mg total) by mouth in the morning AND 2 tablets (100 mg total) at bedtime. 270 tablet 3   zolpidem (AMBIEN) 10 MG tablet Take 10 mg by mouth at bedtime.     No facility-administered medications prior to visit.     PAST MEDICAL HISTORY: Past Medical History:  Diagnosis Date   Anxiety 05/18/2018   Asthma    Chronic anemia 06/29/2016   Chronic pain of both knees 02/15/2019   CKD stage 3 due to type 2 diabetes mellitus (HCC) 05/18/2018   Coronary artery disease 10/01/2018   Diabetes mellitus without complication (HCC)    Dyslipidemia associated with type 2 diabetes mellitus (HCC) 05/18/2018   Lab Results Component Value Date  CHOL 198 08/17/2018  HDL 62.40 08/17/2018  LDLCALC 113 (  H) 08/17/2018  TRIG 113.0 08/17/2018  CHOLHDL 3 08/17/2018     Gastroesophageal reflux disease without esophagitis 06/18/2015   Herniated intervertebral disc of lumbar spine 05/01/2017   Hypertension    Hypertension associated with diabetes (HCC) 05/13/2015   IBS (irritable bowel  syndrome)    Insomnia 05/13/2015   Migraine headache 05/13/2015   Moderate episode of recurrent major depressive disorder (HCC) 01/04/2017   Osteoarthritis of spine 02/03/2017   Transient ischemic attack (TIA) 06/28/2016   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   Lab Results Component Value Date  HGBA1C 5.9 08/17/2018  HGBA1C 5.8 01/04/2017  HGBA1C 6.4 (H) 06/29/2016  Lab Results Component Value Date  MICROALBUR 10.3 (H) 08/17/2018  LDLCALC 113 (H) 08/17/2018  CREATININE 1.56 (H) 08/17/2018       PAST SURGICAL HISTORY: Past Surgical History:  Procedure Laterality Date   CESAREAN SECTION     EXPLORATORY LAPAROTOMY       FAMILY HISTORY: Family History  Problem Relation Age of Onset   Kidney failure Mother    Hypertension Mother    Heart disease Father    Cancer Maternal Grandfather    Migraines Other    Breast cancer Neg Hx      SOCIAL HISTORY: Social History   Socioeconomic History   Marital status: Divorced    Spouse name: Not on file   Number of children: Not on file   Years of education: Not on file   Highest education level: Not on file  Occupational History   Not on file  Tobacco Use   Smoking status: Former    Current packs/day: 0.00    Types: Cigarettes    Quit date: 09/22/2012    Years since quitting: 11.1   Smokeless tobacco: Never  Vaping Use   Vaping status: Never Used  Substance and Sexual Activity   Alcohol use: Yes    Alcohol/week: 1.0 standard drink of alcohol    Types: 1 Glasses of wine per week    Comment: red wine 1-2 x month   Drug use: No   Sexual activity: Not Currently  Other Topics Concern   Not on file  Social History Narrative   Lives with daughter Lakin Rhine)    Right handed   Caffeine: none    Social Drivers of Health   Financial Resource Strain: Low Risk  (05/12/2022)   Overall Financial Resource Strain (CARDIA)    Difficulty of Paying Living Expenses: Not hard at all  Food Insecurity: No  Food Insecurity (05/12/2022)   Hunger Vital Sign    Worried About Running Out of Food in the Last Year: Never true    Ran Out of Food in the Last Year: Never true  Transportation Needs: No Transportation Needs (05/12/2022)   PRAPARE - Administrator, Civil Service (Medical): No    Lack of Transportation (Non-Medical): No  Physical Activity: Insufficiently Active (05/12/2022)   Exercise Vital Sign    Days of Exercise per Week: 3 days    Minutes of Exercise per Session: 40 min  Stress: No Stress Concern Present (05/12/2022)   [employer] of Occupational Health - Occupational Stress Questionnaire    Feeling of Stress : Only a little  Social Connections: Moderately Integrated (05/12/2022)   Social Connection and Isolation Panel [NHANES]    Frequency of Communication with Friends and Family: More than three times a week    Frequency of Social Gatherings with Friends and Family: More than three times  a week    Attends Religious Services: More than 4 times per year    Active Member of Clubs or Organizations: Yes    Attends Banker Meetings: More than 4 times per year    Marital Status: Divorced  Intimate Partner Violence: Not At Risk (05/12/2022)   Humiliation, Afraid, Rape, and Kick questionnaire    Fear of Current or Ex-Partner: No    Emotionally Abused: No    Physically Abused: No    Sexually Abused: No     PHYSICAL EXAM  There were no vitals filed for this visit.    There is no height or weight on file to calculate BMI.  Generalized: Well developed, in no acute distress  Cardiology: normal rate and rhythm, no murmur auscultated  Respiratory: clear to auscultation bilaterally    Neurological examination  Mentation: Alert oriented to time, place, history taking. Follows all commands speech and language fluent Cranial nerve II-XII: Pupils were equal round reactive to light. Extraocular movements were full, visual field were full on confrontational  test. Very mild horizontal saccadic movements. No nystagmus noted. Facial sensation and strength were normal. Head turning and shoulder shrug  were normal and symmetric. Motor: The motor testing reveals 5 over 5 strength of all 4 extremities. Good symmetric motor tone is noted throughout. No evidence of tremor on exam.  Sensory: Sensory testing is intact to soft touch on all 4 extremities. No evidence of extinction is noted.  Coordination: Cerebellar testing reveals good finger-nose-finger and heel-to-shin bilaterally.  Gait and station: Gait is normal.  Reflexes: Deep tendon reflexes are symmetric and normal bilaterally.    DIAGNOSTIC DATA (LABS, IMAGING, TESTING) - I reviewed patient records, labs, notes, testing and imaging myself where available.  Lab Results  Component Value Date   WBC 6.1 11/03/2022   HGB 9.4 (L) 11/03/2022   HCT 29.5 (L) 11/03/2022   MCV 90.5 11/03/2022   PLT 91 (L) 11/03/2022      Component Value Date/Time   NA 134 (L) 11/03/2022 0122   NA 140 10/04/2021 1101   K 5.0 11/03/2022 0122   CL 106 11/03/2022 0122   CO2 18 (L) 11/03/2022 0122   GLUCOSE 101 (H) 11/03/2022 0122   BUN 18 11/03/2022 0122   BUN 23 10/04/2021 1101   CREATININE 1.82 (H) 11/03/2022 0122   CREATININE 1.71 (H) 07/05/2018 1231   CREATININE 1.10 (H) 01/09/2017 1130   CALCIUM 8.4 (L) 11/03/2022 0122   PROT 7.7 02/16/2022 2108   PROT 6.8 09/21/2021 0803   ALBUMIN 4.6 02/16/2022 2108   ALBUMIN 4.4 09/21/2021 0803   AST 21 02/16/2022 2108   AST 20 07/05/2018 1231   ALT 15 02/16/2022 2108   ALT 12 07/05/2018 1231   ALKPHOS 48 02/16/2022 2108   BILITOT 0.7 02/16/2022 2108   BILITOT 0.3 09/21/2021 0803   BILITOT 0.7 07/05/2018 1231   GFRNONAA 30 (L) 11/03/2022 0122   GFRNONAA 30 (L) 07/05/2018 1231   GFRNONAA 54 (L) 01/09/2017 1130   GFRAA 39 (L) 12/06/2019 1045   GFRAA 35 (L) 07/05/2018 1231   GFRAA 62 01/09/2017 1130   Lab Results  Component Value Date   CHOL 196 09/21/2021    HDL 69 09/21/2021   LDLCALC 111 (H) 09/21/2021   TRIG 92 09/21/2021   CHOLHDL 2.8 09/21/2021   Lab Results  Component Value Date   HGBA1C 6.1 (H) 02/17/2022   Lab Results  Component Value Date   VITAMINB12 >1550 (H) 02/11/2021   Lab  Results  Component Value Date   TSH 1.550 11/28/2022        No data to display               No data to display           ASSESSMENT AND PLAN  73 y.o. year old female  has a past medical history of Anxiety (05/18/2018), Asthma, Chronic anemia (06/29/2016), Chronic pain of both knees (02/15/2019), CKD stage 3 due to type 2 diabetes mellitus (HCC) (05/18/2018), Coronary artery disease (10/01/2018), Diabetes mellitus without complication (HCC), Dyslipidemia associated with type 2 diabetes mellitus (HCC) (05/18/2018), Gastroesophageal reflux disease without esophagitis (06/18/2015), Herniated intervertebral disc of lumbar spine (05/01/2017), Hypertension, Hypertension associated with diabetes (HCC) (05/13/2015), IBS (irritable bowel syndrome), Insomnia (05/13/2015), Migraine headache (05/13/2015), Moderate episode of recurrent major depressive disorder (HCC) (01/04/2017), Osteoarthritis of spine (02/03/2017), Transient ischemic attack (TIA) (06/28/2016), and 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 here with    No diagnosis found.  Julie Gilbert reports chronic dizziness remains problematic. She continues to work with PT/OT but has not felt this was beneficial. We have discussed multiple likely contributing factors with old CVA, white matter changes, medication side effects, migraines, etc. MRI in 2019 was stable, however, abnormal eye movements are fairly new over the past year. We have discussed importance of updating imaging. No parkinsonian concerns with exam or history. Migraines seem well managed. We will continue Emgality and topiramate 50mg  in am and 100mg  in pm. Continue Nurtec and Tylenol as needed for abortive care. I  have advised she try to limit use of scopolamine and meclizine due to side effects. She will continue close follow up with cardiology. Workup unremarkable at this time. Healthy lifestyle habits encouraged. She will follow up with PCP as directed for stroke prevention, disease management and evaluation of anemia. She will return to see me in 6 months. She verbalizes understanding and agreement with this plan.   No orders of the defined types were placed in this encounter.    No orders of the defined types were placed in this encounter.   [name], MSN, FNP-C 11/29/2023, 11:22 AM  [facility] [address] [place], [place] [address] [phone]

## 2023-11-30 ENCOUNTER — Other Ambulatory Visit: Payer: Self-pay | Admitting: Cardiovascular Disease

## 2023-12-01 ENCOUNTER — Telehealth: Payer: Self-pay | Admitting: Family Medicine

## 2023-12-01 NOTE — Telephone Encounter (Signed)
 Pt called [facility] stating that she did not give permission to be switched doctors. She is demanding to see Dr. Lucia Gaskins, I explained to her that per policies she will be seeing both. Pt started yelling that she has choices and if she is paying she gets what she wants. Pt continued to argue with me repeating herself and stated that I need to do whatever I needed to do to change that policy. Pt proceeded to cx appt and stated that she will call back and make sure she is scheduled an appt with Dr. Lucia Gaskins. Pt proceeded to hang up on phone staff.  Please advise.

## 2023-12-04 ENCOUNTER — Ambulatory Visit: Payer: Medicare Other | Admitting: Family Medicine

## 2023-12-04 DIAGNOSIS — G43709 Chronic migraine without aura, not intractable, without status migrainosus: Secondary | ICD-10-CM

## 2023-12-05 NOTE — Telephone Encounter (Signed)
 Spoke to patient and daughter on speaker phone .Pt and daughter is persistent seeing Dr Lucia Gaskins . Pt was not clear on Medication regiment for migraines. Read back last office not in August with amy,np   We will continue Emgality every 30 days and topiramate 50mg  in am 100mg  at bedtime. Please take Nurtec as needed. Please try to only use meclazine and scopolamine as needed versus every day. Continue close follow up with cardiology and PCP. Please call [facility] to schedule MRI [facility]. Pt states this was not what she was told at office visit . Scheduled appointment with Dr Lucia Gaskins 11/2023 . Pt thanked me for calling

## 2023-12-14 ENCOUNTER — Ambulatory Visit: Admitting: Neurology

## 2023-12-14 ENCOUNTER — Encounter: Payer: Self-pay | Admitting: Neurology

## 2023-12-14 VITALS — BP 127/83 | HR 65 | Ht 67.0 in | Wt 178.0 lb

## 2023-12-14 DIAGNOSIS — R42 Dizziness and giddiness: Secondary | ICD-10-CM | POA: Diagnosis not present

## 2023-12-14 DIAGNOSIS — G43709 Chronic migraine without aura, not intractable, without status migrainosus: Secondary | ICD-10-CM | POA: Diagnosis not present

## 2023-12-14 DIAGNOSIS — H8110 Benign paroxysmal vertigo, unspecified ear: Secondary | ICD-10-CM

## 2023-12-14 MED ORDER — MECLIZINE HCL 25 MG PO TABS: ORAL_TABLET | ORAL | 0 refills | Status: AC

## 2023-12-14 MED ORDER — QULIPTA 60 MG PO TABS: 60.0000 mg | ORAL_TABLET | Freq: Every day | ORAL | 11 refills | Status: AC

## 2023-12-14 NOTE — Progress Notes (Addendum)
 Chief Complaint  Patient presents with   Follow-up    Patient in room #18 and alone. Patient states she still has been having very painful migraines that last up to 24 hours. Patient states she been having about three a week, has a loss of appetite and losing weight.    HISTORY OF PRESENT ILLNESS:  05/03/2024: addendum. I called patient, went to voice mail, left a message that a referral to [facility] (patient's daughter called and requested) is fine that she requested. I think a referral to [facility] would be appropriate. I'm not sure there is anything else I can provide (see my last note HPI). I also sent a mychart message to patient, At this time will transfer care to academic center. If she is established there, we can cancel appointment that is in February I believe. Thank you.   12/14/2023: This is a patient who has had migraines for 50 years has been going to neurologists in the [facility] system since at least 2018 for her migraines and persistent dizziness, we have tried a plethora of medications and management, we referred her to vestibular therapy and it appears she only had 1 visit, we have tried medications but at this point patient has to go to vestibular therapy.  We have no cure for these symptoms after trying trying multiple migraine medications including the new CGRP medications.  She was seen by Dr. Skeet in neurology in September 2018.  Patient was reported a very ugly to our staff because she was seeing an NP this time around, I explained where a team here and if you are stable or relatively stable then an NP who has been trained personally by me and headache is who we have you follow-up with.  She has had headaches and dizziness since at least 10 years.  She has tried multiple plethora's of medication.  She has persistent dizziness and had an extensive workup, at some point patient has to go back to therapy and there is nothing more we can do for her dizziness.  I agree with NP that taking meclizine   3 times a day in a 73 year old is on advisable.  We have tried limited scopolamine  patches.  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  She saw Dr. Skeet in neurology until 2019 and in 2022 came and saw us .  At the last note in August 2024 she saw NP Amy Lomax in the family stated that Amy was untruthful in her note which is ridiculous.  Patient has been under good control with her migraines on Emgality  and topiramate .  And in August 2024 she had followed up for dizziness and syncopal/seizure-like episodes, we have ordered EEGs, she has had evaluation by cardiology, very unlikely seizures.  She has tried Nurtec and ondansetron  for abortive therapy.  MRI and MRA and MRI cervical spine were ordered.  3-day EEG was normal.  She has been followed by cardiology.  She has a loop recorder.  And she is planning to repeat echo in April next month.  It is also an advisable to be on scopolamine  long-term. She really doesn;t know why she is having more migraines now she was feeling better last August. She is more stressed. She has a lof of family things going on. She is trying to find a therapist. She is having >8 severe migraine days a month and >  15 total headache a days a month for at least > 3 months. Not snoring not fatigued but she take ambien every night.She is having a problem she is not taking her emgality  because of a skin reaction was very bad cgrp medications contraindicated. She also stopped the nurtec. She has tried ubrelvy .   Medications tried that can be used in migraine management greater than 3 months include amlodipine , aspirin , Excedrin, Fioricet, Flexeril, Voltaren  tablets, Benadryl , doxazosin , Emgality , Lexapro , topiramate , gabapentin , losartan , magnesium , meclizine , Reglan , naproxen , nortriptyline , Paxil, prednisone , Lyrica, Phenergan , Nurtec, Imitrex , Maxalt, trazodone , Ubrelvy ,she is  not taking her emgality  because of a skin reaction was very bad cgrp medications contraindicated.   05/10/2023 ALL:  Laurette returns for follow up for migraines, dizziness and syncopal/seizure like episodes. I saw her last 06/2022 following concerns from PT of abnormal ocular movements on exam. MRI brian and orbits ordered but not completed. She was last seen by Dr Ines 11/2022. Headaches were well managed on Emgality  and topiramate  50/100. Nurtec and ondansetron  used for abortive therapy. CT was unremarkable 10/2022. MRI brain, MRA and MR cervical spine ordered but have not been completed. Ambulatory EEG normal 11/2022. 72 hour EEG ordered with [facility]. Normal results read by Dr Gregg 03/2023. She is being followed by cardiology for concerns of syncope. She has an ILR. No arrhythmias so far. She is planning to repeat ECHO 12/2023.   Since, she reports doing fairly well. She continues to have some headaches. They usually are well managed on Emgality  and topramate. Nausea is better with scopolamine  patches. Migraines may be a little worse recently due to weather. Nurtec works well for abortive therapy. She is taking meclizine  25mg  TID. She continues to feel vertigo is debilitating. Significant dizziness with turning head. Occasionally she feels dizzy with position changes. She reports not hearing back to schedule imaging. She has not called to inquire. She did not feel PT was helpful. She has had multiple falls. She uses her cane all the time.   11/28/2022 AA:  Since being seen end of January doing better on migraines and better with vertigo. Hit your head, just fell backwards.She was walking to the fridge she collapsed back. She was out for 5 minutes had to rescuscutate. Her eyes rolled in the back of the head, eyes open, BP very low, said she was dehydrated but drinking plenty of water, no more episodes since then, mother did NOT feel she was going out, eyes open, rolled back, she wasn't  reponding, she was confused when she cme to but recognized daughter, no focal weakness, wasn't breathing, no heartbeat, daughter was literllay doing CPR unclear though if, patient went completelt darknheaded, nor=t lightheaded, no warning, after a few minutes she was fine. She couldn't stand because of dizziness. This was years ago, she went to cardiologist years ago. She sees [name] need to get her an appointment, blood pressure per daughter was 98/70 but in the ED it was higher but unclear if this was after fluids. 8 glasses a day. Make sure to include electrolytes like gatorade so we don;t get hypona+ etc discussed with daughter who proc=vided most information. She has anemia but has been to hematology, she has CKD so could be anemia due to that see Dr. Austin. Hit head so hard daughter heard it. Vertigo is constant, dizziness and she has it right now.    Personally reviewed images.    CT head 10/2022: TECHNIQUE: Contiguous axial images were obtained from the base of the skull  through the vertex without intravenous contrast.   RADIATION DOSE REDUCTION: This exam was performed according to the departmental dose-optimization program which includes automated exposure control, adjustment of the mA and/or kV according to patient size and/or use of iterative reconstruction technique.   COMPARISON:  Head CT dated 09/27/2017.   FINDINGS: Brain: The ventricles and sulci are appropriate size for patient's age. 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 There is no acute intracranial hemorrhage. No mass effect or midline shift. No extra-axial fluid collection.   Vascular: No hyperdense vessel or unexpected calcification.   Skull: Normal. Negative for fracture or focal lesion.   Sinuses/Orbits: No acute finding.   Other: None   IMPRESSION: 1. No acute intracranial pathology. 2. 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  10/25/2022 AA:    This is a 73 year old patient we have seen in the past for multiple neurologic symptoms including migraines and lacunar infarcts.  Repeat MRI of the brain and orbits was ordered in October by my NP but does not look like they were completed.  We initially saw her for migraines past medical history migraine, anxiety, asthma, chronic kidney disease, lacunar infarcts, coronary artery disease, diabetes, dyslipidemia, hypertension, depression, osteoarthritis, B12 deficiency and vertigo.  Patient has several specialists following her including cardiology and when we first met her she was on a heart monitor.  We placed her on Emgality  and Topamax  which seemed to help prior to that she had daily headaches and treatment decreased it to 6 headache days a month.  She has a family history of tremors with action and posture and we kept her on Topamax  and Emgality  and she declined Botox.  Ubrelvy  made her sick.  But we have kept her on Emgality  which has worked.  She was started on Repatha  in the past, Ubrelvy  did not work, we kept her on Emgality  and Topamax .  Barely 2  total headaches a month on Emgaliy aborted by Tylenol .  She is also lost weight with diet changes.  She was not interested in Qulipta  or Botox.  Last seen by my nurse practitioner Greig Forbes, she has suffered vertigo for years, we sent her to PT, MRI in March 2019 showed chronic lacunar infarcts in the right caudate and left cerebellum with nonspecific white matter changes mild to moderate for age all stable from 2017.  At that time she continued to work with PT for dizziness and vertigo and she was doing better.  Followed by Dr. Austin at [facility].  Using Flexeril and pregabalin and meclizine  3 times daily.  She was weaned off clonazepam  and and has insomnia as well.  We recommended an eye exam.  At that time she reported about 4 migraine days in headache days per month doing great on Emgality .  She is followed by Dr. Austin last A1c 6.1 last LDL 111 and she  continued aspirin  81 mg and atorvastatin  and Repatha .  Sleep study was normal.  Tremors continue not at rest.  She continues to try to hydrate.   She has had a 2 week migraines but usually doing excellent on Emgality  and only has 4 migraines a month will try nurtec acutely. She did miss her emgality . The weather and stress has affected her. Last emgality  was Dec 28th so due for emgality  now and her prescription. Put the prescription on autofill and as soon as its ready take it. Ubrelvy  did not work. We discussed continuing emgality   and topamax  but she needs a good acute management medication, triptans contraindicated due to strokes will try and get her nurtec. Take her to iv infusion today for status migrainosus.   Patient complains of symptoms per HPI as well as the following symptoms: status migrainosus . Pertinent negatives and positives per HPI. All others negative  07/25/2022 ALL: Analese returns for an acute visit. She has suffered from vertigo for years. She was referred to PT by Dr Ines. We received a message regarding abnormal occulomotor and coordination testing 07/04/2022 Per report, she had B UE and L>R LE dysmetria and B mild intention tremor, direction changing gaze-evoked nystagmus, saccades with vertical and horizontal smooth pursuits, corrective saccades and difficulty maintaining focus with VOR cancellation. MRI in 11/2017 showed chronic lacunar infarcts in right caudate and left cerebellum, non specific white matter changes, mild to moderate for age, all stable from 2017 imaging.   She continues to work with PT. She reports that dizziness remains constant. Her daughter feels that she is doing better. She missed visits last week due to back pain. She is followed by Dr Austin with [facility]. She is treated with cyclobenzaprine and pregabalin. She uses meclizine  TID. She was weaned off clonazepam  (anxiety) and started on Ambien 10mg  for insomnia. She denies vision loss. No double vision.  She does have blurred vision with headaches only. She occasionally has left eye pain with migraines. She has not had an eye exam recently.   She continues Emgality  monthly and topiramate  50mg  in am and 100mg  in pm for migraine prevention. She reports having about 4 headache days per month. Rest will abort headache. She feels that weather changes are most common trigger.   She is followed regularly by Dr Austin. Last A1C 6.1. Last LDL 111. She continues asa 81mg  and atorvastatin  40mg . She reported side effects with Repatha . She reports being told to take iron supplements last week. She reports low Hgb. Last hgb 11. She reports blood work was updated with Dr Austin last week. She is being set up for a colonoscopy. She reports not sleeping well despite taking Ambien 10mg  daily. She reports sleep study was normal. She does not snore. She continues to have a tremor. Worse on left hand but can be both. Tremor is worse with activity. If sitting still, no tremor. She reports drinking at least 80oz of water daily.   04/19/2022 ALL: KENYON ESHLEMAN is a 73 y.o. female here today for follow up for migraines. She continues emgality  and topiramate  50mg  in am and 100mg  at bedtime. She reports that headaches wax and wane. She has severe allergies and reports this contributed to some months of worsening. She has had really bad allergies this year. She can't breath well in the hot weather. She reports that headaches are much better on Emgality  and topiramate . Most months she has 1-2 headaches days easily aborted with Tylenol  and rest. She has lost 30lbs with diet changes. She does report that injection seems to sting a little more now. She is not interested in Qulipta  due to fears of worsening constipation. She tries to drink 4 bottles of water daily.   HISTORY (copied from Dr Sharion previous note)  10/19/2021: Emgality  has been working great. Ubrelvy  made her sick. She has been doing excellent on the Emgality . She was a little  dizzy on standing, her BP slightly elevated, we had to get her a wheelchair. Today she feels bad and has been feeling bad since the repatha . Ubrelvy  does not  work when she has a migraine. She stayed on her Topamax  and she was doing well. Patient not feeling well after taking repatha , looks ill today, says she has been feeling unwell since taking it, her BP is slightly elevated, she appears to be tremulous, I called [facility] and they have an 8 hour wait. [facility] and no wait time but I was able to call her pcp Dr. Vicci and got her an appointment at 330. Daughter is here as well and provides information, she has not been feeling well since Repatha . She has been doing fantastic on Emgality , barely 2 total headaches a month   HPI 05/20/2021:  SAKEENA TEALL is a 73 y.o. female here as requested by Kennyth Worth HERO, MD for migraines. PMHx migraine, anxiety, asthma, chronic kidney disease, coronary artery disease, diabetes, dyslipidemia, hypertension, depression, osteoarthritis, lacunar stroke, B12 deficiency, vertigo. Here with dauhter who also provides information. She is doing a lot better with her migraines. She has a cardiologist. She is on a heart monitor. Her migraines cn be severe in the left side back and side, pulsating/pounding/throbbing/, pulsating/pounding/throbbing, photophobia/phonophobia, most severe pain is the back in the left, the Emgality  helps a lot, she is also on Topamax . Extremely severe. Nausea, she would pass out it was so severe. Used to have daily headaches, weather can be a trigger, a lot less maybe about 6 a month, was not able to get the ubrelvy .  Mother had tremors. With action and posture. Order Ubrelvy  for acute management. Preventative Topamax  and emgality . Discussed botox.No other focal neurologic deficits, associated symptoms, inciting events or modifiable factors.   Reviewed notes, labs and imaging from outside physicians, which showed:   From a thorough review of  records, medications tried that can be used in migraine management include: Amlodipine  (calcium  channel blocker similar to verapamil), aspirin , Fioricet, Voltaren  tablet, Benadryl , Emgality , Lexapro , gabapentin , meclizine , melatonin, Reglan  injections, naproxen , Zofran , prednisone  tablets, Phenergan  tablets, Imitrex , Topamax , trazodone , propranolol contraindicated due to asthma, amitriptyline/nortrio contraindicated due to being on lexapro , TRIPTANS CONTRAINDICATED due to strokes   REVIEW OF SYSTEMS: Out of a complete 14 system review of symptoms, the patient complains only of the following symptoms, headaches, vertigo, falls, seasonal allergies, constipation and all other reviewed systems are negative.   ALLERGIES: Allergies  Allergen Reactions   Peanuts [Peanut Oil] Anaphylaxis   Shrimp [Shellfish Allergy] Anaphylaxis   Latex Hives and Other (See Comments)    latex   Elemental Sulfur Itching and Swelling   Iodinated Contrast Media Hives    Pt reported she had hives after recent procedure w/ CT contrast.    Other Other (See Comments)    Adhesive on EKG pads irritated skin   Penicillins Hives    Has patient had a PCN reaction causing immediate rash, facial/tongue/throat swelling, SOB or lightheadedness with hypotension: No  Has patient had a PCN reaction causing severe rash involving mucus membranes or skin necrosis: No  Has patient had a PCN reaction that required hospitalization: No  Has patient had a PCN reaction occurring within the last 10 years: No  If all of the above answers are NO, then may proceed with Cephalosporin use.  Has patient had a PCN reaction causing immediate rash, facial/tongue/throat swelling, SOB or lightheadedness with hypotension: No, Has patient had a PCN reaction causing severe rash involving mucus membranes or skin necrosis: No, Has patient had a PCN reaction that required hospitalization: No, Has patient had a PCN reaction occurring within the last 10  years: No, ,  If all of the above answers are NO, then may proceed with Cephalosporin use.   Pineapple Swelling and Other (See Comments)    Scratchy throat, Tongue swelling   Pineapple Extract Other (See Comments) and Swelling    pineapple extract   Sulfa Antibiotics Hives   Sulfur Itching and Swelling   Evolocumab  Other (See Comments) and Palpitations    Dizzy, scratchy eyes/throat, palpitations  Other Reaction(s): dizziness  evolocumab      HOME MEDICATIONS: Outpatient Medications Prior to Visit  Medication Sig Dispense Refill   Albuterol -Budesonide  90-80 MCG/ACT AERO Inhale 2 puffs into the lungs daily.     Ascorbic Acid (VITAMIN C) 1000 MG tablet Take 1,000 mg by mouth at bedtime.     aspirin  EC 81 MG tablet Take 81 mg by mouth every morning. Swallow whole.     Blood Glucose Monitoring Suppl (ACCU-CHEK AVIVA PLUS) w/Device KIT Use as directed to check blood sugar once daily. E11.9 (Patient not taking: Reported on 04/22/2024) 1 kit 0   Blood Pressure Monitoring (BLOOD PRESSURE KIT) DEVI 1 application by Does not apply route daily. Use to check blood pressure daily (Patient not taking: Reported on 04/22/2024) 1 Device 0   cholecalciferol  (VITAMIN D3) 25 MCG (1000 UNIT) tablet Take 1,000 Units by mouth at bedtime.     COD LIVER OIL PO Take 1-2 capsules by mouth See admin instructions. Take one capsule by mouth every morning and take two capsules at night     cyclobenzaprine (FLEXERIL) 10 MG tablet Take 10 mg by mouth 3 (three) times daily as needed. (Patient not taking: Reported on 04/22/2024)     escitalopram  (LEXAPRO ) 20 MG tablet Take 20 mg by mouth daily.     fluticasone  (FLONASE ) 50 MCG/ACT nasal spray PLACE 1 SPRAY INTO BOTH NOSTRILS DAILY AS NEEDED FOR ALLERGIES OR RHINITIS. (Patient not taking: Reported on 04/22/2024) 48 mL 3   GARLIC PO Take 1 tablet by mouth at bedtime. (Patient not taking: Reported on 04/22/2024)     losartan  (COZAAR ) 25 MG tablet TAKE 1 TABLET BY MOUTH EVERY DAY  (Patient not taking: Reported on 04/22/2024) 90 tablet 0   ondansetron  (ZOFRAN -ODT) 4 MG disintegrating tablet Take 1-2 tablets (4-8 mg total) by mouth every 8 (eight) hours as needed. 30 tablet 3   pantoprazole  (PROTONIX ) 40 MG tablet Take 40 mg by mouth every morning.     pregabalin (LYRICA) 50 MG capsule 50 mg as needed. (Patient not taking: Reported on 04/22/2024)     rosuvastatin  (CRESTOR ) 10 MG tablet Take 1 tablet (10 mg total) by mouth daily. (Patient not taking: Reported on 04/22/2024) 90 tablet 3   topiramate  (TOPAMAX ) 50 MG tablet Take 1 tablet (50 mg total) by mouth in the morning AND 2 tablets (100 mg total) at bedtime. 270 tablet 3   meclizine  (ANTIVERT ) 25 MG tablet TAKE 1 TABLET BY MOUTH THREE TIMES A DAY 270 tablet 0   scopolamine  (TRANSDERM-SCOP) 1 MG/3DAYS PLACE 1 PATCH ONTO THE SKIN EVERY 3 DAYS. 10 patch 0   scopolamine  (TRANSDERM-SCOP) 1 MG/3DAYS PLACE 1 PATCH ONTO THE SKIN EVERY 3 DAYS. 10 patch 0   EMGALITY  120 MG/ML SOAJ Inject 120 ml monthly. (Patient not taking: Reported on 12/14/2023) 1 mL 2   glucose blood (ACCU-CHEK AVIVA PLUS) test strip Use as instructed to check blood sugar once daily. E11.9 (Patient not taking: Reported on 12/14/2023) 100 each 12   Lancets (ACCU-CHEK SOFT TOUCH) lancets Use as instructed to check blood sugar once daily E11.9 (Patient  not taking: Reported on 12/14/2023) 100 each 12   Multiple Vitamin (MULTIVITAMIN WITH MINERALS) TABS tablet Take 1 tablet by mouth at bedtime. (Patient not taking: Reported on 12/14/2023)     Omega-3 Fatty Acids (FISH OIL  PO) Take 1 capsule by mouth at bedtime. (Patient not taking: Reported on 12/14/2023)     Rimegepant Sulfate (NURTEC) 75 MG TBDP Take 1 tablet (75 mg total) by mouth daily as needed. For migraines. Take as close to onset of migraine as possible. One daily maximum. (Patient not taking: Reported on 12/14/2023) 16 tablet 11   zolpidem (AMBIEN) 10 MG tablet Take 10 mg by mouth at bedtime. (Patient not taking: Reported  on 12/14/2023)     No facility-administered medications prior to visit.     PAST MEDICAL HISTORY: Past Medical History:  Diagnosis Date   Anxiety 05/18/2018   Asthma    Chronic anemia 06/29/2016   Chronic pain of both knees 02/15/2019   CKD stage 3 due to type 2 diabetes mellitus (HCC) 05/18/2018   Coronary artery disease 10/01/2018   Diabetes mellitus without complication (HCC)    Dyslipidemia associated with type 2 diabetes mellitus (HCC) 05/18/2018   Lab Results Component Value Date  CHOL 198 08/17/2018  HDL 62.40 08/17/2018  LDLCALC 113 (H) 08/17/2018  TRIG 113.0 08/17/2018  CHOLHDL 3 08/17/2018     Gastroesophageal reflux disease without esophagitis 06/18/2015   Herniated intervertebral disc of lumbar spine 05/01/2017   Hypertension    Hypertension associated with diabetes (HCC) 05/13/2015   IBS (irritable bowel syndrome)    Insomnia 05/13/2015   Migraine headache 05/13/2015   Moderate episode of recurrent major depressive disorder (HCC) 01/04/2017   Osteoarthritis of spine 02/03/2017   Transient ischemic attack (TIA) 06/28/2016   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   Lab Results Component Value Date  HGBA1C 5.9 08/17/2018  HGBA1C 5.8 01/04/2017  HGBA1C 6.4 (H) 06/29/2016  Lab Results Component Value Date  MICROALBUR 10.3 (H) 08/17/2018  LDLCALC 113 (H) 08/17/2018  CREATININE 1.56 (H) 08/17/2018       PAST SURGICAL HISTORY: Past Surgical History:  Procedure Laterality Date   CESAREAN SECTION     EXPLORATORY LAPAROTOMY       FAMILY HISTORY: Family History  Problem Relation Age of Onset   Kidney failure Mother    Hypertension Mother    Heart disease Father    Cancer Maternal Grandfather    Migraines Other    Breast cancer Neg Hx      SOCIAL HISTORY: Social History   Socioeconomic History   Marital status: Divorced    Spouse name: Not on file   Number of children: Not on file   Years of education: Not on file   Highest education  level: Not on file  Occupational History   Not on file  Tobacco Use   Smoking status: Former    Current packs/day: 0.00    Types: Cigarettes    Quit date: 09/22/2012    Years since quitting: 11.6   Smokeless tobacco: Never  Vaping Use   Vaping status: Never Used  Substance and Sexual Activity   Alcohol use: Yes    Alcohol/week: 1.0 standard drink of alcohol    Types: 1 Glasses of wine per week    Comment: red wine 1-2 x month   Drug use: No   Sexual activity: Not Currently  Other Topics Concern   Not on file  Social History Narrative   Lives with daughter Jacelyn  [place])    Right handed   Caffeine : none    Social Drivers of Corporate investment banker Strain: Low Risk  (05/12/2022)   Overall Financial Resource Strain (CARDIA)    Difficulty of Paying Living Expenses: Not hard at all  Food Insecurity: No Food Insecurity (05/12/2022)   Hunger Vital Sign    Worried About Running Out of Food in the Last Year: Never true    Ran Out of Food in the Last Year: Never true  Transportation Needs: No Transportation Needs (05/12/2022)   PRAPARE - Administrator, Civil Service (Medical): No    Lack of Transportation (Non-Medical): No  Physical Activity: Insufficiently Active (05/12/2022)   Exercise Vital Sign    Days of Exercise per Week: 3 days    Minutes of Exercise per Session: 40 min  Stress: No Stress Concern Present (05/12/2022)   [employer] of Occupational Health - Occupational Stress Questionnaire    Feeling of Stress : Only a little  Social Connections: Moderately Integrated (05/12/2022)   Social Connection and Isolation Panel    Frequency of Communication with Friends and Family: More than three times a week    Frequency of Social Gatherings with Friends and Family: More than three times a week    Attends Religious Services: More than 4 times per year    Active Member of [employer] or Organizations: Yes    Attends Banker Meetings: More than 4 times  per year    Marital Status: Divorced  Intimate Partner Violence: Not At Risk (05/12/2022)   Humiliation, Afraid, Rape, and Kick questionnaire    Fear of Current or Ex-Partner: No    Emotionally Abused: No    Physically Abused: No    Sexually Abused: No     PHYSICAL EXAM  Vitals:   12/14/23 0810  BP: 127/83  Pulse: 65  Weight: 178 lb (80.7 kg)  Height: 5' 7 (1.702 m)      Body mass index is 27.88 kg/m. Exam: NAD, pleasant                  Speech:    Speech is normal; fluent and spontaneous with normal comprehension.  Cognition:    The patient is oriented to person, place, and time;     recent and remote memory intact;     language fluent;    Cranial Nerves:    The pupils are equal, round, and reactive to light.Trigeminal sensation is intact and the muscles of mastication are normal. The face is symmetric. The palate elevates in the midline. Hearing intact. Voice is normal. Shoulder shrug is normal. The tongue has normal motion without fasciculations.   Coordination:  No dysmetria  Motor Observation:    No asymmetry, no atrophy, and no involuntary movements noted. Tone:    Normal muscle tone.     Strength:    Strength is V/V in the upper and lower limbs.      Sensation: intact to LT    DIAGNOSTIC DATA (LABS, IMAGING, TESTING) - I reviewed patient records, labs, notes, testing and imaging myself where available.  Lab Results  Component Value Date   WBC 4.6 04/22/2024   HGB 12.3 04/22/2024   HCT 38.6 04/22/2024   MCV 87.9 04/22/2024   PLT 183 04/22/2024      Component Value Date/Time   NA 136 04/22/2024 2148   NA 140 10/04/2021 1101   K 4.7 04/22/2024 2148   CL 104 04/22/2024 2148  CO2 21 (L) 04/22/2024 2148   GLUCOSE 100 (H) 04/22/2024 2148   BUN 24 (H) 04/22/2024 2148   BUN 23 10/04/2021 1101   CREATININE 2.40 (H) 04/22/2024 2148   CREATININE 1.71 (H) 07/05/2018 1231   CREATININE 1.10 (H) 01/09/2017 1130   CALCIUM  9.7 04/22/2024 2148   PROT  8.1 04/22/2024 2148   PROT 6.8 09/21/2021 0803   ALBUMIN  4.2 04/22/2024 2148   ALBUMIN  4.4 09/21/2021 0803   AST 33 04/22/2024 2148   AST 20 07/05/2018 1231   ALT 17 04/22/2024 2148   ALT 12 07/05/2018 1231   ALKPHOS 80 04/22/2024 2148   BILITOT 1.2 04/22/2024 2148   BILITOT 0.3 09/21/2021 0803   BILITOT 0.7 07/05/2018 1231   GFRNONAA 21 (L) 04/22/2024 2148   GFRNONAA 30 (L) 07/05/2018 1231   GFRNONAA 54 (L) 01/09/2017 1130   GFRAA 39 (L) 12/06/2019 1045   GFRAA 35 (L) 07/05/2018 1231   GFRAA 62 01/09/2017 1130   Lab Results  Component Value Date   CHOL 196 09/21/2021   HDL 69 09/21/2021   LDLCALC 111 (H) 09/21/2021   TRIG 92 09/21/2021   CHOLHDL 2.8 09/21/2021   Lab Results  Component Value Date   HGBA1C 6.1 (H) 02/17/2022   Lab Results  Component Value Date   VITAMINB12 >1550 (H) 02/11/2021   Lab Results  Component Value Date   TSH 1.550 11/28/2022        No data to display               No data to display           ASSESSMENT AND PLAN  73 y.o. year old female  has a past medical history of Anxiety (05/18/2018), Asthma, Chronic anemia (06/29/2016), Chronic pain of both knees (02/15/2019), CKD stage 3 due to type 2 diabetes mellitus (HCC) (05/18/2018), Coronary artery disease (10/01/2018), Diabetes mellitus without complication (HCC), Dyslipidemia associated with type 2 diabetes mellitus (HCC) (05/18/2018), Gastroesophageal reflux disease without esophagitis (06/18/2015), Herniated intervertebral disc of lumbar spine (05/01/2017), Hypertension, Hypertension associated with diabetes (HCC) (05/13/2015), IBS (irritable bowel syndrome), Insomnia (05/13/2015), Migraine headache (05/13/2015), Moderate episode of recurrent major depressive disorder (HCC) (01/04/2017), Osteoarthritis of spine (02/03/2017), Transient ischemic attack (TIA) (06/28/2016), and 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 here with    Chronic migraine w/o aura w/o status  migrainosus, not intractable - Plan: Atogepant  (QULIPTA ) 60 MG TABS, Ambulatory referral to Neurology  Dizziness - Plan: Ambulatory referral to Physical Therapy, Ambulatory referral to Neurology  Vertigo - Plan: Ambulatory referral to Physical Therapy, Ambulatory referral to Neurology  Benign paroxysmal positional vertigo, unspecified laterality - Plan: meclizine  (ANTIVERT ) 25 MG tablet  Zelda SHAUNNA [place] r   HISTORY OF PRESENT ILLNESS:  05/03/2024: addendum. I called patient, went to voice mail, left a message that a referral to [facility] (patient's daughter called and requested) is fine that she requested. I think a referral to [facility] would be appropriate. I'm not sure there is anything else I can provide (see my last note HPI). I also sent a mychart message to patient, At this time will transfer care to academic center. If she is established there, we can cancel appointment that is in February I believe. Thank you.   - reports chronic dizziness remains problematic.  She has had this for years and she started having migraines over 50 years ago and worsening in the setting of stopping her Emgality  and not telling us , and significant stress. taking meclizine  3  times a day which I do not recommend and Amy Lomax also did not recommend I have decreased that prescription to once a day for 30 days and then stop.  We referred her to vestibular therapy but I only saw that she went for 1 visit.  Today I talked to her about completing the therapy.  I also think that she should not be on meclizine  3 times a day she is also on Ambien and she has a lot of stress in her life I think at this point I highly encouraged her to get a therapist and a psychiatrist and we will refer her to vestibular therapy again as she has to work with them. -She states she cannot tolerate the needle injections she has a very bad reaction so CGRP injections are contraindicated now we will try Qulipta  pill.  She states she does not know why she  stopped Nurtec or Ubrelvy , she appears stressed today, we discussed stress and ways to counter stress - We have discussed multiple likely contributing factors with old CVA, white matter changes, medication side effects, migraines, etc. MRI in 2019 was stable, however, abnormal eye movements are fairly new over the past year. We have discussed importance of updating imaging. No parkinsonian concerns with exam or history. Migraines were well managed according to last appointment but she stopped her Emgality .  - I have advised she stop scopolamine  and meclizine  due to side effects. She will continue close follow up with cardiology. Workup unremarkable at this time. Healthy lifestyle habits encouraged. She will follow up with PCP as directed for stroke prevention, disease management and evaluation of anemia. She will return to see me in 6 months. She verbalizes understanding and agreement with this plan -Discussed other options such as Cefaly and Nerivio..   Orders Placed This Encounter  Procedures   Ambulatory referral to Physical Therapy    Referral Priority:   Routine    Referral Type:   Physical Medicine    Referral Reason:   Specialty Services Required    Requested Specialty:   Physical Therapy    Number of Visits Requested:   1   Ambulatory referral to Neurology    Referral Priority:   Routine    Referral Type:   Consultation    Referral Reason:   Specialty Services Required    Requested Specialty:   Neurology    Number of Visits Requested:   1     Meds ordered this encounter  Medications   Atogepant  (QULIPTA ) 60 MG TABS    Sig: Take 1 tablet (60 mg total) by mouth daily.    Dispense:  30 tablet    Refill:  11   meclizine  (ANTIVERT ) 25 MG tablet    Sig: Please take one pill at bedtime as needed then stop when prescription runs out. Please cancel any prior meclizine  prescriptions    Dispense:  30 tablet    Refill:  0    Please cancel any prior meclizine  prescriptions    Onetha epp MD  [facility] [address], [address] [place], [place] [address] [phone]  I spent over 40 minutes of face-to-face and non-face-to-face time with patient on the  1. Chronic migraine w/o aura w/o status migrainosus, not intractable   2. Dizziness   3. Vertigo   4. Benign paroxysmal positional vertigo, unspecified laterality    diagnosis.  This included previsit chart review, lab review, study review, order entry, electronic health record documentation, patient education on the  different diagnostic and therapeutic options, counseling and coordination of care, risks and benefits of management, compliance, or risk factor reduction

## 2023-12-14 NOTE — Patient Instructions (Addendum)
 She would like somethng natural. We have devices we can try (see below). Also since emgality helped we can try a pill form called qulipta we can start that. VESTIBULAR THERAPY for the dizziness Titrate off of the meclizine take it once a day for 2 weeks and stop Start Qulipta 60mg  a day  Stess can cause many problems, recommend seeing psychiatry  and therapy (we all need a good psychiatrist and therapist!)  Non pharmaceutical treatments for migraines  Cefaly    Nerivio     Atogepant Tablets What is this medication? ATOGEPANT (a TOE je pant) prevents migraines. It works by blocking a substance in the body that causes migraines. This medicine may be used for other purposes; ask your health care provider or pharmacist if you have questions. COMMON BRAND NAME(S): QULIPTA What should I tell my care team before I take this medication? They need to know if you have any of these conditions: Kidney disease Liver disease An unusual or allergic reaction to atogepant, other medications, foods, dyes, or preservatives Pregnant or trying to get pregnant Breast-feeding How should I use this medication? Take this medication by mouth with water. Take it as directed on the prescription label at the same time every day. You can take it with or without food. If it upsets your stomach, take it with food. Keep taking it unless your care team tells you to stop. Talk to your care team about the use of this medication in children. Special care may be needed. Overdosage: If you think you have taken too much of this medicine contact a poison control center or emergency room at once. NOTE: This medicine is only for you. Do not share this medicine with others. What if I miss a dose? If you miss a dose, take it as soon as you can. If it is almost time for your next dose, take only that dose. Do not take double or extra doses. What may interact with this medication? Carbamazepine Certain medications for fungal  infections, such as itraconazole, ketoconazole Clarithromycin Cyclosporine Efavirenz Etravirine Phenytoin Rifampin St. John's wort This list may not describe all possible interactions. Give your health care provider a list of all the medicines, herbs, non-prescription drugs, or dietary supplements you use. Also tell them if you smoke, drink alcohol, or use illegal drugs. Some items may interact with your medicine. What should I watch for while using this medication? Visit your care team for regular checks on your progress. Tell your care team if your symptoms do not start to get better or if they get worse. What side effects may I notice from receiving this medication? Side effects that you should report to your care team as soon as possible: Allergic reactions--skin rash, itching, hives, swelling of the face, lips, tongue, or throat Side effects that usually do not require medical attention (report to your care team if they continue or are bothersome): Constipation Fatigue Loss of appetite with weight loss Nausea This list may not describe all possible side effects. Call your doctor for medical advice about side effects. You may report side effects to FDA at 1-800-FDA-1088. Where should I keep my medication? Keep out of the reach of children and pets. Store at room temperature between 20 and 25 degrees C (68 and 77 degrees F). Get rid of any unused medication after the expiration date. To get rid of medications that are no longer needed or have expired: Take the medication to a medication take-back program. Check with your pharmacy or  law enforcement to find a location. If you cannot return the medication, check the label or package insert to see if the medication should be thrown out in the garbage or flushed down the toilet. If you are not sure, ask your care team. If it is safe to put it in the trash, take the medication out of the container. Mix the medication with cat litter, dirt,  coffee grounds, or other unwanted substance. Seal the mixture in a bag or container. Put it in the trash. NOTE: This sheet is a summary. It may not cover all possible information. If you have questions about this medicine, talk to your doctor, pharmacist, or health care provider.  2024 Elsevier/Gold Standard (2021-11-01 00:00:00)

## 2023-12-18 ENCOUNTER — Telehealth: Payer: Self-pay | Admitting: Neurology

## 2023-12-18 ENCOUNTER — Other Ambulatory Visit (HOSPITAL_COMMUNITY): Payer: Self-pay

## 2023-12-18 ENCOUNTER — Telehealth: Payer: Self-pay

## 2023-12-18 ENCOUNTER — Ambulatory Visit (INDEPENDENT_AMBULATORY_CARE_PROVIDER_SITE_OTHER): Payer: Medicare Other

## 2023-12-18 DIAGNOSIS — R55 Syncope and collapse: Secondary | ICD-10-CM

## 2023-12-18 LAB — CUP PACEART REMOTE DEVICE CHECK
Date Time Interrogation Session: 20250324002620
Implantable Pulse Generator Implant Date: 20240408

## 2023-12-18 NOTE — Telephone Encounter (Signed)
 Pt said pharmacy informed need a prior authorization sent to insurance before can fill prescription for Atogepant (QULIPTA) 60 MG TABS

## 2023-12-18 NOTE — Telephone Encounter (Signed)
 Pharmacy Patient Advocate Encounter  Received notification from [facility] that Prior Authorization for Qulipta 60MG  tablets has been APPROVED from 12/18/2023 to 09/25/2024. Unable to obtain price due to refill too soon rejection, last fill date 12/18/2023 next available fill date4/16/2025   PA #/Case ID/Reference #: PA Case ID #: [id number]

## 2023-12-18 NOTE — Telephone Encounter (Signed)
 Pharmacy Patient Advocate Encounter   Received notification from Physician's Office that prior authorization for Qulipta 60MG  tablets is required/requested.   Insurance verification completed.   The patient is insured through [facility] .   Per test claim: PA required; PA submitted to above mentioned insurance via CoverMyMeds Key/confirmation #/EOC [id number] Status is pending

## 2023-12-19 NOTE — Progress Notes (Signed)
 [facility] Summary Report / Loop Recorder

## 2023-12-19 NOTE — Addendum Note (Signed)
 Addended by: Geralyn Flash D on: 12/19/2023 04:52 PM   Modules accepted: Orders

## 2023-12-20 ENCOUNTER — Other Ambulatory Visit: Payer: Self-pay | Admitting: Neurology

## 2023-12-20 NOTE — Telephone Encounter (Signed)
 I called pt and she said that she cannot afford the qulipta $500/month even after approved. I told her that she did not have any more options orally, vyepti is an IV infusion, she cannot take CGRP injectables. I told her to call [phone] for PAP for qulipta to see if she would qualify.  I told her she could also get online and see if she qualified due to her [facility] PART D plan.  She asked about samples.  I told I did not know if we had any but can check and see if so, see if MD would approve. She would let us know.

## 2023-12-20 NOTE — Telephone Encounter (Signed)
 Pt called wanting to know if there is something else she can take in place of this medication due to it being over $500 a month with the PA Please advise.

## 2023-12-21 ENCOUNTER — Ambulatory Visit: Attending: Neurology | Admitting: Physical Therapy

## 2023-12-21 NOTE — Telephone Encounter (Addendum)
 I called and LMVM for pt if she was about to get more information about PAP for qulipta.  She was to call me back.

## 2023-12-21 NOTE — Telephone Encounter (Signed)
 I called pt and LMVM for her is she was able to connect with PAP for qulipta.

## 2023-12-23 ENCOUNTER — Encounter: Payer: Self-pay | Admitting: Cardiology

## 2023-12-25 NOTE — Telephone Encounter (Signed)
 She has tried multiple medications, at this time I agree with your advice [phone] for PAP for qulipta to see if she would qualify. I told her she could also get online and see if she qualified due to her [facility] PART D plan." We don;t have samples thanks

## 2024-01-02 ENCOUNTER — Other Ambulatory Visit: Payer: Self-pay | Admitting: Neurology

## 2024-01-17 ENCOUNTER — Ambulatory Visit (INDEPENDENT_AMBULATORY_CARE_PROVIDER_SITE_OTHER)

## 2024-01-17 ENCOUNTER — Telehealth (HOSPITAL_BASED_OUTPATIENT_CLINIC_OR_DEPARTMENT_OTHER): Payer: Self-pay | Admitting: Cardiovascular Disease

## 2024-01-17 DIAGNOSIS — I6523 Occlusion and stenosis of bilateral carotid arteries: Secondary | ICD-10-CM

## 2024-01-17 NOTE — Telephone Encounter (Signed)
 Pt was in office today for a carotid. She came out completely dissatisfied. Patient claims that she was not given the choice whether or not to have the echo tech student perform the study/test. She also stated that she didn't appreciate not being called by her name. Patient expressed that she thought Dr Theodis Fiscal was performing the test. Relaying message for pt.

## 2024-01-18 ENCOUNTER — Encounter (HOSPITAL_BASED_OUTPATIENT_CLINIC_OR_DEPARTMENT_OTHER): Payer: Self-pay

## 2024-01-18 ENCOUNTER — Other Ambulatory Visit (HOSPITAL_BASED_OUTPATIENT_CLINIC_OR_DEPARTMENT_OTHER): Payer: Self-pay

## 2024-01-18 DIAGNOSIS — I6523 Occlusion and stenosis of bilateral carotid arteries: Secondary | ICD-10-CM

## 2024-01-22 ENCOUNTER — Ambulatory Visit (INDEPENDENT_AMBULATORY_CARE_PROVIDER_SITE_OTHER): Payer: Medicare Other

## 2024-01-22 ENCOUNTER — Encounter: Payer: Self-pay | Admitting: Cardiology

## 2024-01-22 DIAGNOSIS — R55 Syncope and collapse: Secondary | ICD-10-CM | POA: Diagnosis not present

## 2024-01-22 LAB — CUP PACEART REMOTE DEVICE CHECK
Date Time Interrogation Session: 20250428003050
Implantable Pulse Generator Implant Date: 20240408

## 2024-02-02 NOTE — Progress Notes (Signed)
 [facility] Summary Report / Loop Recorder

## 2024-02-12 IMAGING — NM NM PULMONARY PERF PARTICULATE
8 series · 8 of 8 positions shown · non-contrast
Comparison: Chest radiograph 02/16/2022

CLINICAL DATA: Chest pain and shortness of breath question
pulmonary embolism

EXAM:
NUCLEAR MEDICINE PERFUSION LUNG SCAN
TECHNIQUE: Perfusion images were obtained in multiple projections after
intravenous injection of radiopharmaceutical.
Ventilation scans intentionally deferred if perfusion scan and chest
x-ray adequate for interpretation during COVID 19 epidemic.
RADIOPHARMACEUTICALS:  4 mCi Bc-KKm MAA IV

[Series 1: ant/post perf · 4.14mm/px · 1 of 1 slices shown (1 of 2)]
[im 1/1]
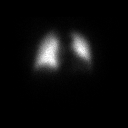

[Series 1: ant/post perf · 4.14mm/px · 1 of 1 slices shown (2 of 2)]
[im 1/1]
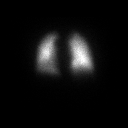

[Series 2: lao/rpo perf · 4.14mm/px · 1 of 1 slices shown (1 of 2)]
[im 1/1]
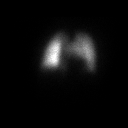

[Series 2: lao/rpo perf · 4.14mm/px · 1 of 1 slices shown (2 of 2)]
[im 1/1]
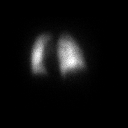

[Series 3: lpo/rao perf · 4.14mm/px · 1 of 1 slices shown (1 of 2)]
[im 1/1]
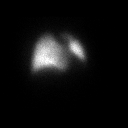

[Series 3: lpo/rao perf · 4.14mm/px · 1 of 1 slices shown (2 of 2)]
[im 1/1]
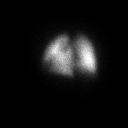

[Series 4: lt lat/rt lat perf · 4.14mm/px · 1 of 1 slices shown (1 of 2)]
[im 1/1]
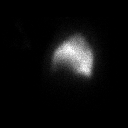

[Series 4: lt lat/rt lat perf · 4.14mm/px · 1 of 1 slices shown (2 of 2)]
[im 1/1]
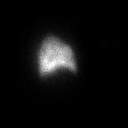

[8 of 8 positions shown; findings below may reference images not displayed]

FINDINGS: Normal perfusion lung scan.

No perfusion defects.
IMPRESSION: Normal perfusion lung scan.

## 2024-02-13 ENCOUNTER — Encounter: Payer: Self-pay | Admitting: Neurology

## 2024-02-13 ENCOUNTER — Ambulatory Visit: Admitting: Neurology

## 2024-02-22 ENCOUNTER — Ambulatory Visit

## 2024-02-22 DIAGNOSIS — R55 Syncope and collapse: Secondary | ICD-10-CM

## 2024-02-22 LAB — CUP PACEART REMOTE DEVICE CHECK
Date Time Interrogation Session: 20250529002415
Implantable Pulse Generator Implant Date: 20240408

## 2024-02-26 ENCOUNTER — Ambulatory Visit: Payer: Self-pay | Admitting: Cardiology

## 2024-02-26 ENCOUNTER — Encounter

## 2024-03-08 NOTE — Progress Notes (Signed)
 [facility] Summary Report / Loop Recorder

## 2024-03-25 ENCOUNTER — Ambulatory Visit (INDEPENDENT_AMBULATORY_CARE_PROVIDER_SITE_OTHER)

## 2024-03-25 DIAGNOSIS — R55 Syncope and collapse: Secondary | ICD-10-CM

## 2024-03-25 LAB — CUP PACEART REMOTE DEVICE CHECK
Date Time Interrogation Session: 20250629230354
Implantable Pulse Generator Implant Date: 20240408

## 2024-03-27 ENCOUNTER — Ambulatory Visit: Payer: Self-pay | Admitting: Cardiology

## 2024-03-28 ENCOUNTER — Encounter

## 2024-04-12 NOTE — Progress Notes (Signed)
 [facility] Summary Report / Loop Recorder

## 2024-04-15 ENCOUNTER — Other Ambulatory Visit: Payer: Self-pay | Admitting: *Deleted

## 2024-04-15 NOTE — Telephone Encounter (Signed)
 Last seen on 12/14/23 No follow up scheduled   Too soon to refill last filled on 02/25/24 #90 day supply

## 2024-04-22 ENCOUNTER — Emergency Department (HOSPITAL_COMMUNITY)
Admission: EM | Admit: 2024-04-22 | Discharge: 2024-04-23 | Disposition: A | Discharge status: Home / Self Care | Attending: Emergency Medicine | Admitting: Emergency Medicine

## 2024-04-22 ENCOUNTER — Encounter: Payer: Self-pay | Admitting: Emergency Medicine

## 2024-04-22 ENCOUNTER — Other Ambulatory Visit: Payer: Self-pay

## 2024-04-22 ENCOUNTER — Ambulatory Visit: Admission: EM | Admit: 2024-04-22 | Discharge: 2024-04-22 | Disposition: A | Discharge status: Home / Self Care

## 2024-04-22 ENCOUNTER — Emergency Department (HOSPITAL_COMMUNITY)

## 2024-04-22 DIAGNOSIS — Z7984 Long term (current) use of oral hypoglycemic drugs: Secondary | ICD-10-CM | POA: Insufficient documentation

## 2024-04-22 DIAGNOSIS — Z7982 Long term (current) use of aspirin: Secondary | ICD-10-CM | POA: Insufficient documentation

## 2024-04-22 DIAGNOSIS — Z79899 Other long term (current) drug therapy: Secondary | ICD-10-CM | POA: Diagnosis not present

## 2024-04-22 DIAGNOSIS — N179 Acute kidney failure, unspecified: Secondary | ICD-10-CM | POA: Insufficient documentation

## 2024-04-22 DIAGNOSIS — E1122 Type 2 diabetes mellitus with diabetic chronic kidney disease: Secondary | ICD-10-CM | POA: Insufficient documentation

## 2024-04-22 DIAGNOSIS — R531 Weakness: Secondary | ICD-10-CM | POA: Diagnosis not present

## 2024-04-22 DIAGNOSIS — Z91010 Allergy to peanuts: Secondary | ICD-10-CM | POA: Insufficient documentation

## 2024-04-22 DIAGNOSIS — I2510 Atherosclerotic heart disease of native coronary artery without angina pectoris: Secondary | ICD-10-CM | POA: Diagnosis not present

## 2024-04-22 DIAGNOSIS — I129 Hypertensive chronic kidney disease with stage 1 through stage 4 chronic kidney disease, or unspecified chronic kidney disease: Secondary | ICD-10-CM | POA: Insufficient documentation

## 2024-04-22 DIAGNOSIS — R051 Acute cough: Secondary | ICD-10-CM

## 2024-04-22 DIAGNOSIS — N189 Chronic kidney disease, unspecified: Secondary | ICD-10-CM | POA: Insufficient documentation

## 2024-04-22 DIAGNOSIS — R112 Nausea with vomiting, unspecified: Secondary | ICD-10-CM | POA: Diagnosis not present

## 2024-04-22 DIAGNOSIS — Z91040 Latex allergy status: Secondary | ICD-10-CM | POA: Insufficient documentation

## 2024-04-22 DIAGNOSIS — R42 Dizziness and giddiness: Secondary | ICD-10-CM

## 2024-04-22 DIAGNOSIS — R509 Fever, unspecified: Secondary | ICD-10-CM | POA: Diagnosis present

## 2024-04-22 LAB — CBC
HCT: 38.6 % (ref 36.0–46.0)
Hemoglobin: 12.3 g/dL (ref 12.0–15.0)
MCH: 28 pg (ref 26.0–34.0)
MCHC: 31.9 g/dL (ref 30.0–36.0)
MCV: 87.9 fL (ref 80.0–100.0)
Platelets: 183 K/uL (ref 150–400)
RBC: 4.39 MIL/uL (ref 3.87–5.11)
RDW: 13.3 % (ref 11.5–15.5)
WBC: 4.6 K/uL (ref 4.0–10.5)
nRBC: 0 % (ref 0.0–0.2)

## 2024-04-22 LAB — COMPREHENSIVE METABOLIC PANEL WITH GFR
ALT: 17 U/L (ref 0–44)
AST: 33 U/L (ref 15–41)
Albumin: 4.2 g/dL (ref 3.5–5.0)
Alkaline Phosphatase: 80 U/L (ref 38–126)
Anion gap: 11 (ref 5–15)
BUN: 24 mg/dL — ABNORMAL HIGH (ref 8–23)
CO2: 21 mmol/L — ABNORMAL LOW (ref 22–32)
Calcium: 9.7 mg/dL (ref 8.9–10.3)
Chloride: 104 mmol/L (ref 98–111)
Creatinine, Ser: 2.4 mg/dL — ABNORMAL HIGH (ref 0.44–1.00)
GFR, Estimated: 21 mL/min — ABNORMAL LOW (ref >60)
Glucose, Bld: 100 mg/dL — ABNORMAL HIGH (ref 70–99)
Potassium: 4.7 mmol/L (ref 3.5–5.1)
Sodium: 136 mmol/L (ref 135–145)
Total Bilirubin: 1.2 mg/dL (ref 0.0–1.2)
Total Protein: 8.1 g/dL (ref 6.5–8.1)

## 2024-04-22 LAB — URINALYSIS, ROUTINE W REFLEX MICROSCOPIC
Bilirubin Urine: NEGATIVE
Glucose, UA: NEGATIVE mg/dL
Hgb urine dipstick: NEGATIVE
Ketones, ur: NEGATIVE mg/dL
Nitrite: NEGATIVE
Protein, ur: NEGATIVE mg/dL
Specific Gravity, Urine: 1.003 — ABNORMAL LOW (ref 1.005–1.030)
pH: 5 (ref 5.0–8.0)

## 2024-04-22 LAB — LIPASE, BLOOD: Lipase: 40 U/L (ref 11–51)

## 2024-04-22 LAB — RESP PANEL BY RT-PCR (RSV, FLU A&B, COVID)  RVPGX2
Influenza A by PCR: NEGATIVE
Influenza B by PCR: NEGATIVE
Resp Syncytial Virus by PCR: NEGATIVE
SARS Coronavirus 2 by RT PCR: NEGATIVE

## 2024-04-22 LAB — POCT FASTING CBG KUC MANUAL ENTRY: POCT Glucose (KUC): 143 mg/dL — AB (ref 70–99)

## 2024-04-22 LAB — CBG MONITORING, ED: Glucose-Capillary: 104 mg/dL — ABNORMAL HIGH (ref 70–99)

## 2024-04-22 MED ORDER — MAGNESIUM SULFATE IN D5W 1-5 GM/100ML-% IV SOLN
1.0000 g | Freq: Once | INTRAVENOUS | Status: AC
  Administered 2024-04-22: 1 g via INTRAVENOUS
  Filled 2024-04-22: qty 100

## 2024-04-22 MED ORDER — SODIUM CHLORIDE 0.9 % IV BOLUS
1000.0000 mL | Freq: Once | INTRAVENOUS | Status: AC
  Administered 2024-04-22: 1000 mL via INTRAVENOUS

## 2024-04-22 NOTE — ED Provider Notes (Signed)
 Patient here today for evaluation of nausea, chills, dry cough, dry heaving, dizziness and generalized weakness she has had for 2 weeks.  She has not had any diarrhea.  She has not been able to eat much and daughter reports that she has had approximately 1 bland meal a day and water.  They deny any sick contacts.  Given significant past medical history, borderline fever in office as well as elevated blood pressure recommended further evaluation in the emergency room as I suspect she needs imaging and labs at minimum.  Daughter who is with her is agreeable and will transport via POV.   Billy Asberry FALCON, PA-C 04/22/24 [phone]

## 2024-04-22 NOTE — ED Triage Notes (Signed)
 Pt and daughter reports nausea, chills, dry cough, weakness, dizziness, and dry heaving x2 weeks. Denies diarrhea or actual emesis. Has been able to keep water and one bland meal/day down. No sick contacts. No medications used for symptoms at home.

## 2024-04-22 NOTE — ED Triage Notes (Signed)
 Patient c/o weakness x 2 weeks. Patient report unable to keep anything down.  Patient report nausea vomiting and diarrhea x 4 today. Patient was seen at urgent care for further work up.

## 2024-04-22 NOTE — ED Provider Notes (Signed)
 [place] EMERGENCY DEPARTMENT AT [facility] Provider Note   CSN: [id number] Arrival date & time: 04/22/24  2031     Patient presents with: Weakness   Julie Gilbert is a 73 y.o. female.   The history is provided by the patient, a relative and medical records. No language interpreter was used.  Weakness    73 year old female history of diabetes, hypertension, GERD, chronic anemia, CKD, CAD, B12 deficiency sent here from urgent care center for evaluation of weakness.  Patient report for the past 2 weeks she has had generalized fatigue, decreased appetite, some sinus congestion, occasional sneezing, and for the past 2 days she also endorsed having some chills and subjective fever.  States she does not have much of an appetite at and she cannot seem to get herself warm enough.  She also having recurring nonbloody not mucousy diarrhea.  No chest pain or shortness of breath no productive cough no urinary discomfort.  She denies any recent medication changes denies eating any exotic food or any recent travel.  She went to urgent care center but was sent here for further evaluation.   Prior to Admission medications   Medication Sig Start Date End Date Taking? Authorizing Provider  Albuterol -Budesonide  90-80 MCG/ACT AERO Inhale 2 puffs into the lungs daily.    [provider]  Ascorbic Acid (VITAMIN C) 1000 MG tablet Take 1,000 mg by mouth at bedtime.    [provider]  aspirin  EC 81 MG tablet Take 81 mg by mouth every morning. Swallow whole.    [provider]  Atogepant  (QULIPTA ) 60 MG TABS Take 1 tablet (60 mg total) by mouth daily. Patient not taking: Reported on 04/22/2024 12/14/23   Julie Onetha NOVAK, MD  atorvastatin  (LIPITOR) 40 MG tablet Take 40 mg by mouth at bedtime.    [provider]  Blood Glucose Monitoring Suppl (ACCU-CHEK AVIVA PLUS) w/Device KIT Use as directed to check blood sugar once daily. E11.9 Patient not taking: Reported  on 04/22/2024 06/20/17   Jegede, Olugbemiga E, MD  Blood Pressure Monitoring (BLOOD PRESSURE KIT) DEVI 1 application by Does not apply route daily. Use to check blood pressure daily Patient not taking: Reported on 04/22/2024 06/20/17   Jegede, Olugbemiga E, MD  cholecalciferol  (VITAMIN D3) 25 MCG (1000 UNIT) tablet Take 1,000 Units by mouth at bedtime.    [provider]  COD LIVER OIL PO Take 1-2 capsules by mouth See admin instructions. Take one capsule by mouth every morning and take two capsules at night    [provider]  cyclobenzaprine (FLEXERIL) 10 MG tablet Take 10 mg by mouth 3 (three) times daily as needed. Patient not taking: Reported on 04/22/2024 12/26/22   [provider]  escitalopram  (LEXAPRO ) 20 MG tablet Take 20 mg by mouth daily. 11/02/22   [provider]  fluticasone  (FLONASE ) 50 MCG/ACT nasal spray PLACE 1 SPRAY INTO BOTH NOSTRILS DAILY AS NEEDED FOR ALLERGIES OR RHINITIS. Patient not taking: Reported on 04/22/2024 05/27/22   Mercer Clotilda SAUNDERS, MD  GARLIC PO Take 1 tablet by mouth at bedtime. Patient not taking: Reported on 04/22/2024    [provider]  losartan  (COZAAR ) 25 MG tablet TAKE 1 TABLET BY MOUTH EVERY DAY Patient not taking: Reported on 04/22/2024 11/30/23   Julie Riggs, MD  losartan -hydrochlorothiazide (HYZAAR) 100-12.5 MG tablet Take 1 tablet by mouth daily. 04/10/24   [provider]  losartan -hydrochlorothiazide (HYZAAR) 50-12.5 MG tablet Take 1 tablet by mouth daily. Patient not  taking: Reported on 04/22/2024 02/16/24   [provider]  meclizine  (ANTIVERT ) 25 MG tablet Please take one pill at bedtime as needed then stop when prescription runs out. Please cancel any prior meclizine  prescriptions 12/14/23   Julie Onetha NOVAK, MD  metFORMIN  (GLUCOPHAGE ) 500 MG tablet Oral Patient not taking: Reported on 04/22/2024 02/09/17   [provider]  Omega-3 Fatty Acids (FISH OIL ) 500 MG CAPS Oral 02/09/17    [provider]  ondansetron  (ZOFRAN -ODT) 4 MG disintegrating tablet Take 1-2 tablets (4-8 mg total) by mouth every 8 (eight) hours as needed. 11/28/22   Julie Onetha NOVAK, MD  pantoprazole  (PROTONIX ) 40 MG tablet Take 40 mg by mouth every morning. 12/24/21   [provider]  pregabalin (LYRICA) 50 MG capsule 50 mg as needed. Patient not taking: Reported on 04/22/2024 07/01/22   [provider]  pregabalin (LYRICA) 75 MG capsule Take 75 mg by mouth 3 (three) times daily. 04/11/24   [provider]  promethazine  (PHENERGAN ) 25 MG tablet Take 25 mg by mouth every 6 (six) hours as needed. 04/10/24   [provider]  rosuvastatin  (CRESTOR ) 10 MG tablet Take 1 tablet (10 mg total) by mouth daily. Patient not taking: Reported on 04/22/2024 08/22/23   Julie Reche RAMAN, NP  topiramate  (TOPAMAX ) 50 MG tablet Take 1 tablet (50 mg total) by mouth in the morning AND 2 tablets (100 mg total) at bedtime. 06/06/23   Julie Gilbert, Amy, NP  zolpidem (AMBIEN) 10 MG tablet Take 10 mg by mouth at bedtime. 03/27/24   [provider]    Allergies: Peanuts [peanut oil], Shrimp [shellfish allergy], Latex, Elemental sulfur, Iodinated contrast media, Other, Penicillins, Pineapple, Pineapple extract, Sulfa antibiotics, Sulfur, and Evolocumab     Review of Systems  Neurological:  Positive for weakness.  All other systems reviewed and are negative.   Updated Vital Signs BP (!) 165/110   Pulse 81   Temp 98.2 F (36.8 C)   Resp 16   LMP  (LMP Unknown) Comment: Menopausal  SpO2 99%   Physical Exam Vitals and nursing note reviewed.  Constitutional:      General: She is not in acute distress.    Appearance: She is well-developed.  HENT:     Head: Atraumatic.  Eyes:     Conjunctiva/sclera: Conjunctivae normal.  Cardiovascular:     Rate and Rhythm: Normal rate and regular rhythm.     Pulses: Normal pulses.     Heart sounds: Normal heart sounds.  Pulmonary:     Effort:  Pulmonary effort is normal.     Breath sounds: No wheezing, rhonchi or rales.  Abdominal:     Palpations: Abdomen is soft.     Tenderness: There is abdominal tenderness (Mild generalized tenderness no guarding or rebound tenderness).  Musculoskeletal:     Cervical back: Normal range of motion and neck supple. No rigidity.  Skin:    Findings: No rash.  Neurological:     Mental Status: She is alert. Mental status is at baseline.  Psychiatric:        Mood and Affect: Mood normal.     (all labs ordered are listed, but only abnormal results are displayed) Labs Reviewed  COMPREHENSIVE METABOLIC PANEL WITH GFR - Abnormal; Notable for the following components:      Result Value   CO2 21 (*)    Glucose, Bld 100 (*)    BUN 24 (*)    Creatinine, Ser 2.40 (*)    GFR, Estimated 21 (*)  All other components within normal limits  URINALYSIS, ROUTINE W REFLEX MICROSCOPIC - Abnormal; Notable for the following components:   Color, Urine STRAW (*)    Specific Gravity, Urine 1.003 (*)    Leukocytes,Ua SMALL (*)    Bacteria, UA RARE (*)    All other components within normal limits  CBG MONITORING, ED - Abnormal; Notable for the following components:   Glucose-Capillary 104 (*)    All other components within normal limits  RESP PANEL BY RT-PCR (RSV, FLU A&B, COVID)  [id number]  CBC  LIPASE, BLOOD    EKG: None ED ECG REPORT   Date: 04/22/2024  Rate: 75  Rhythm: normal sinus rhythm  QRS Axis: left  Intervals: QT prolonged  ST/T Wave abnormalities: nonspecific T wave changes  Conduction Disutrbances:none  Narrative Interpretation:   Old EKG Reviewed: changes noted and new prolonged QT  I have personally reviewed the EKG tracing and agree with the computerized printout as noted.   Radiology: No results found.   Procedures   Medications Ordered in the ED  magnesium  sulfate IVPB 1 g 100 mL (has no administration in time range)  sodium chloride  0.9 % bolus 1,000 mL (1,000 mLs  Intravenous New Bag/Given 04/22/24 2146)                                    Medical Decision Making Amount and/or Complexity of Data Reviewed Labs: ordered. Radiology: ordered.  Risk Prescription drug management.   BP (!) 165/110   Pulse 81   Temp 98.2 F (36.8 C)   Resp 16   LMP  (LMP Unknown) Comment: Menopausal  SpO2 99%   30:87 PM  73 year old female history of diabetes, hypertension, GERD, chronic anemia, CKD, CAD, B12 deficiency sent here from urgent care center for evaluation of weakness.  Patient report for the past 2 weeks she has had generalized fatigue, decreased appetite, some sinus congestion, occasional sneezing, and for the past 2 days she also endorsed having some chills and subjective fever.  States she does not have much of an appetite at and she cannot seem to get herself warm enough.  She also having recurring nonbloody not mucousy diarrhea.  No chest pain or shortness of breath no productive cough no urinary discomfort.  She denies any recent medication changes denies eating any exotic food or any recent travel.  She went to urgent care center but was sent here for further evaluation.  On exam patient is laying in bed appears to be in no acute discomfort.  Heart with normal rate and rhythm, lungs are clear abdomen soft with minimal generalized tenderness no guarding or rebound tenderness.  Strength are equal throughout  -Labs ordered, independently viewed and interpreted by me.  Labs remarkable for Cr 2.4, slightly worse than baseline.  UA without UTI. Normal lipase.   -The patient was maintained on a cardiac monitor.  I personally viewed and interpreted the cardiac monitored which showed an underlying rhythm of: sinus rhythm -Imaging including abd/pelvis CT ordered but have not resulted yet -This patient presents to the ED for concern of fatigue, this involves an extensive number of treatment options, and is a complaint that carries with it a high risk of  complications and morbidity.  The differential diagnosis includes electrolytes imbalance, anemia, infection, depresssion, dehydration -Co morbidities that complicate the patient evaluation includes DM, HTN, GERD, anemia, CKD, CAD -Treatment includes IVF, magnesium  -Reevaluation of the  patient after these medicines showed that the patient stayed the same -PCP office notes or outside notes reviewed -Discussion with oncoming provider who will f/u on CT result and determine disposition -Escalation to admission/observation considered: patient at this moment does not want to be admitted for AKI.       Final diagnoses:  Generalized weakness  AKI (acute kidney injury) [facility])    ED Discharge Orders     None          Nivia Colon, PA-C 04/22/24 2354    Charlyn Sora, MD 04/25/24 0005

## 2024-04-22 NOTE — ED Notes (Signed)
 Patient is being discharged from the Urgent Care and sent to the Emergency Department via Private Vehicle (Family) . Per Provider, patient is in need of higher level of care due to light headedness. Patient is aware and verbalizes understanding of plan of care.  Vitals:   04/22/24 1931  BP: (!) 148/103  Pulse: 80  Resp: 20  Temp: 99 F (37.2 C)  SpO2: 97%

## 2024-04-23 NOTE — Discharge Instructions (Signed)
 As we discussed, labs did show some signs of dehydration.  Push oral fluids at home. Follow-up with your doctor. Please return here for any new or acute changes--worsening weakness, passing out, chest pain, etc.

## 2024-04-23 NOTE — Telephone Encounter (Signed)
 Pt is asking for a call to discuss if now on 7/29 the refill can be done for her topiramate  (TOPAMAX ) 50 MG tablet

## 2024-04-23 NOTE — ED Notes (Signed)
 Patient d/c with home care instructions. IV discontinued.

## 2024-04-23 NOTE — ED Provider Notes (Signed)
 Assumed care at shift change.  See prior note for full H&P.  Briefly, 73 year old female sent here from urgent care for further evaluation of generalized weakness.  Has been ongoing for about 2 weeks now.  Found to have AKI on screening labs and was given IV fluids.    Plan: CT stone study pending.  Prior team offered admission but declined.  Will reassess once CT done and discuss again.  Results for orders placed or performed during the hospital encounter of 04/22/24  CBG monitoring, ED   Collection Time: 04/22/24  9:05 PM  Result Value Ref Range   Glucose-Capillary 104 (H) 70 - 99 mg/dL  Resp panel by RT-PCR (RSV, Flu A&B, Covid) Anterior Nasal Swab   Collection Time: 04/22/24  9:47 PM   Specimen: Anterior Nasal Swab  Result Value Ref Range   SARS Coronavirus 2 by RT PCR NEGATIVE NEGATIVE   Influenza A by PCR NEGATIVE NEGATIVE   Influenza B by PCR NEGATIVE NEGATIVE   Resp Syncytial Virus by PCR NEGATIVE NEGATIVE  Comprehensive metabolic panel   Collection Time: 04/22/24  9:48 PM  Result Value Ref Range   Sodium 136 135 - 145 mmol/L   Potassium 4.7 3.5 - 5.1 mmol/L   Chloride 104 98 - 111 mmol/L   CO2 21 (L) 22 - 32 mmol/L   Glucose, Bld 100 (H) 70 - 99 mg/dL   BUN 24 (H) 8 - 23 mg/dL   Creatinine, Ser 7.59 (H) 0.44 - 1.00 mg/dL   Calcium  9.7 8.9 - 10.3 mg/dL   Total Protein 8.1 6.5 - 8.1 g/dL   Albumin  4.2 3.5 - 5.0 g/dL   AST 33 15 - 41 U/L   ALT 17 0 - 44 U/L   Alkaline Phosphatase 80 38 - 126 U/L   Total Bilirubin 1.2 0.0 - 1.2 mg/dL   GFR, Estimated 21 (L) >60 mL/min   Anion gap 11 5 - 15  CBC   Collection Time: 04/22/24  9:48 PM  Result Value Ref Range   WBC 4.6 4.0 - 10.5 K/uL   RBC 4.39 3.87 - 5.11 MIL/uL   Hemoglobin 12.3 12.0 - 15.0 g/dL   HCT 61.3 63.9 - 53.9 %   MCV 87.9 80.0 - 100.0 fL   MCH 28.0 26.0 - 34.0 pg   MCHC 31.9 30.0 - 36.0 g/dL   RDW 86.6 88.4 - 84.4 %   Platelets 183 150 - 400 K/uL   nRBC 0.0 0.0 - 0.2 %  Urinalysis, Routine w reflex  microscopic -Urine, Clean Catch   Collection Time: 04/22/24  9:48 PM  Result Value Ref Range   Color, Urine STRAW (A) YELLOW   APPearance CLEAR CLEAR   Specific Gravity, Urine 1.003 (L) 1.005 - 1.030   pH 5.0 5.0 - 8.0   Glucose, UA NEGATIVE NEGATIVE mg/dL   Hgb urine dipstick NEGATIVE NEGATIVE   Bilirubin Urine NEGATIVE NEGATIVE   Ketones, ur NEGATIVE NEGATIVE mg/dL   Protein, ur NEGATIVE NEGATIVE mg/dL   Nitrite NEGATIVE NEGATIVE   Leukocytes,Ua SMALL (A) NEGATIVE   RBC / HPF 0-5 0 - 5 RBC/hpf   WBC, UA 6-10 0 - 5 WBC/hpf   Bacteria, UA RARE (A) NONE SEEN   Squamous Epithelial / HPF 6-10 0 - 5 /HPF  Lipase, blood   Collection Time: 04/22/24  9:48 PM  Result Value Ref Range   Lipase 40 11 - 51 U/L   CT Renal Stone Study Result Date: 04/23/2024 CLINICAL DATA:  Flank pain  with nausea and vomiting EXAM: CT ABDOMEN AND PELVIS WITHOUT CONTRAST TECHNIQUE: Multidetector CT imaging of the abdomen and pelvis was performed following the standard protocol without IV contrast. RADIATION DOSE REDUCTION: This exam was performed according to the departmental dose-optimization program which includes automated exposure control, adjustment of the mA and/or kV according to patient size and/or use of iterative reconstruction technique. COMPARISON:  None Available. FINDINGS: Lower chest: No acute abnormality. Hepatobiliary: Gallbladder is within normal limits. Liver is within normal limits with the exception of a small cyst in the anterior aspect of the right lobe of the liver. Pancreas: Unremarkable. No pancreatic ductal dilatation or surrounding inflammatory changes. Spleen: Normal in size without focal abnormality. Adrenals/Urinary Tract: Adrenal glands are within normal limits. The kidneys are well visualized bilaterally. Hypodense lesions are noted within the lower pole of the left kidney likely representing small cysts. No further follow-up is recommended. No obstructive changes are seen. The bladder is  well distended. Stomach/Bowel: The appendix is not well visualized. No inflammatory changes are seen. No obstructive or inflammatory change of the colon is noted. The stomach and small bowel are within normal limits. Vascular/Lymphatic: Aortic atherosclerosis. No enlarged abdominal or pelvic lymph nodes. Reproductive: The uterus is prominent and somewhat nodular with multiple calcified uterine fibroids. No adnexal mass is seen. Other: No abdominal wall hernia or abnormality. No abdominopelvic ascites. Musculoskeletal: No acute or significant osseous findings. IMPRESSION: Uterine fibroid change. No acute abnormality is identified correspond with the given clinical history. Electronically Signed   By: Oneil Devonshire M.D.   On: 04/23/2024 00:12   CUP PACEART REMOTE DEVICE CHECK Result Date: 03/25/2024 ILR summary report received. Battery status OK. Normal device function. No new symptom, tachy, brady, or pause episodes. No new AF episodes. Monthly summary reports and ROV/PRN LA, CVRS  CT without acute findings.  We discussed admission once more, however patient and daughter are still adamant they would like to go home.  We discussed lab results once more indicating some dehydration, daughter will assist to push oral fluids at home.  EKG also with prolonged QT so will need to monitor for any signs of near syncope/syncope.  Daughter reports she actually has a doctors appt in the morning so will discuss with PCP as well.  This seems reasonable.  They both understand to return here for any new/acute changes.   Jarold Olam HERO, PA-C 04/23/24 0059    Raford Lenis, MD 04/23/24 [phone]

## 2024-04-25 ENCOUNTER — Ambulatory Visit (INDEPENDENT_AMBULATORY_CARE_PROVIDER_SITE_OTHER)

## 2024-04-25 DIAGNOSIS — R55 Syncope and collapse: Secondary | ICD-10-CM

## 2024-04-25 LAB — CUP PACEART REMOTE DEVICE CHECK
Date Time Interrogation Session: 20250730231006
Implantable Pulse Generator Implant Date: 20240408

## 2024-04-25 NOTE — Progress Notes (Signed)
 [facility] Summary Report / Loop Recorder

## 2024-04-29 ENCOUNTER — Ambulatory Visit: Payer: Self-pay | Admitting: Cardiology

## 2024-04-29 ENCOUNTER — Encounter

## 2024-04-30 ENCOUNTER — Other Ambulatory Visit: Payer: Self-pay | Admitting: Neurology

## 2024-04-30 DIAGNOSIS — H8110 Benign paroxysmal vertigo, unspecified ear: Secondary | ICD-10-CM

## 2024-04-30 NOTE — Telephone Encounter (Signed)
 LVM for patient to call back and discuss refill request per last rx Stop medication when when rx runs out . Medication was for 30 days

## 2024-05-01 ENCOUNTER — Telehealth: Payer: Self-pay | Admitting: Neurology

## 2024-05-01 DIAGNOSIS — G43909 Migraine, unspecified, not intractable, without status migrainosus: Secondary | ICD-10-CM

## 2024-05-01 NOTE — Telephone Encounter (Signed)
 I called daughter of pt.   She wanted to see about an appt earlier then January 2026.  Her mother had another episode of syncope, Monday pm (sitting then went up to kitchen, then sank, hit her head).  Did not go to ED or pcp.  Looks like email to cardiology to check for heart event.  She had also been to ED 04/22/2024 for weakness, not admitted. Daughter said that pt had been complaining of migraines prior to the Monday.  She wanted to get order for CT head.  I told her that we cannot order CT w/o seeing pt.  She may see her pcp for evaluation, I relayed that if on blood thinners, have change in level of consciousness, to go to ED.  We have seen her for migraines, dizziness, syncope. She asked about refills on nurtec, , not able to take qulipta  due to cost. Emgality  causing bruising.  She wanted to see about her mother taking infusion Vyepti.  I told her that med management appt to discuss.  She wanted meclizine . Her mom taking TID as needed.  I explained that Dr. Ines did last prescription and it states to stop after that  script.   I told her I did not have a sooner appt at this time.  She said she would like to be referred to [facility] .  If Dr. Ines wanted to call her that would be fine.  [facility] [address]

## 2024-05-01 NOTE — Telephone Encounter (Signed)
 Patient's daughter, Ikram Riebe called Monday patient was walking in the kitchen and collapse, was unconscious for 3 minutes. Patient hit back of hit head on floor. Would like to schedule an appointment. Ms. Busker stated if Dr. Ines can not see her before January she can make my mother a referral to [facility] or [facility]. Would like a call back.

## 2024-05-01 NOTE — Telephone Encounter (Signed)
 I spoke with daughter.  Asking for a renewal of meclizine .  I relayed what was on the last prescription stopping after she takes this.  See previous phone message.

## 2024-05-02 ENCOUNTER — Ambulatory Visit: Payer: Self-pay | Admitting: Cardiovascular Disease

## 2024-05-02 ENCOUNTER — Telehealth: Payer: Self-pay | Admitting: Cardiovascular Disease

## 2024-05-02 NOTE — Telephone Encounter (Signed)
 Returned call to daughter.  Syncopal episode happened Monday August 4 in the afternoon/evening.  Linq last data send August 5 at 12:05 pm.   No episodes noted.  Advised daughter no episodes noted.

## 2024-05-02 NOTE — Telephone Encounter (Signed)
  Per MyChart scheduling message:  My mother collapsed and we need to see if her [employer] picked up on a cardiac event.

## 2024-05-03 MED ORDER — NURTEC 75 MG PO TBDP: 75.0000 mg | ORAL_TABLET | Freq: Every day | ORAL | 6 refills | Status: AC | PRN

## 2024-05-03 NOTE — Telephone Encounter (Signed)
 I called patient, went to voice mail, left a message that a referral to [facility] (patient's daughter called and requested) is fine that she requested. I think a referral to [facility] would be appropriate. I'm not sure there is anything else I can provide (see my last note HPI). I also sent a mychart message to patient, At this time will transfer care to academic center. If she is established there, we can cancel appointment that is in February I believe. Thank you.

## 2024-05-03 NOTE — Addendum Note (Signed)
 Addended by: Maisley Hainsworth B on: 05/03/2024 12:54 PM   Modules accepted: Orders

## 2024-05-06 ENCOUNTER — Telehealth: Payer: Self-pay | Admitting: Family Medicine

## 2024-05-06 DIAGNOSIS — H8110 Benign paroxysmal vertigo, unspecified ear: Secondary | ICD-10-CM

## 2024-05-06 NOTE — Telephone Encounter (Signed)
 I called pt /daughter Ruby.  LMVM for pt about referral to [facility] placed for her for migraines and dizziness.  This also was sent in [place] message as well.

## 2024-05-06 NOTE — Telephone Encounter (Signed)
 Referral for neurology fax to [facility]. Phone: [phone], Fax: [phone]

## 2024-05-06 NOTE — Telephone Encounter (Signed)
 I called and spoke to daughter of pt, Ruby, and relayed that referral was placed per daughters request.  I mentioned that a mychart message was sent to her from Dr. Ines as well.  She verbalized understanding.

## 2024-05-17 NOTE — Telephone Encounter (Signed)
 Pt's daughter reports that [facility] is not accepting new pt's but [facility] is, she is asking the referral be sent there.

## 2024-05-20 NOTE — Telephone Encounter (Signed)
 Referral for neurology refax to [facility]. Phone: [phone], Fax: [phone].

## 2024-05-20 NOTE — Addendum Note (Signed)
 Addended by: CARY NO L on: 05/20/2024 04:39 PM   Modules accepted: Orders

## 2024-05-27 ENCOUNTER — Ambulatory Visit (INDEPENDENT_AMBULATORY_CARE_PROVIDER_SITE_OTHER)

## 2024-05-27 DIAGNOSIS — R55 Syncope and collapse: Secondary | ICD-10-CM | POA: Diagnosis not present

## 2024-05-29 LAB — CUP PACEART REMOTE DEVICE CHECK
Date Time Interrogation Session: 20250830231034
Implantable Pulse Generator Implant Date: 20240408

## 2024-05-30 ENCOUNTER — Encounter

## 2024-06-01 ENCOUNTER — Ambulatory Visit: Payer: Self-pay | Admitting: Cardiology

## 2024-06-04 NOTE — Progress Notes (Signed)
 Remote Loop Recorder Transmission

## 2024-06-17 ENCOUNTER — Other Ambulatory Visit: Payer: Self-pay | Admitting: Family Medicine

## 2024-06-17 DIAGNOSIS — Z1231 Encounter for screening mammogram for malignant neoplasm of breast: Secondary | ICD-10-CM

## 2024-06-25 NOTE — Progress Notes (Signed)
 Remote Loop Recorder Transmission

## 2024-06-27 ENCOUNTER — Ambulatory Visit: Payer: Self-pay | Admitting: Cardiology

## 2024-06-27 ENCOUNTER — Ambulatory Visit

## 2024-06-27 DIAGNOSIS — R55 Syncope and collapse: Secondary | ICD-10-CM | POA: Diagnosis not present

## 2024-06-27 LAB — CUP PACEART REMOTE DEVICE CHECK
Date Time Interrogation Session: 20251001231001
Implantable Pulse Generator Implant Date: 20240408

## 2024-06-28 NOTE — Progress Notes (Signed)
 Remote Loop Recorder Transmission

## 2024-07-01 ENCOUNTER — Encounter

## 2024-07-03 ENCOUNTER — Ambulatory Visit (INDEPENDENT_AMBULATORY_CARE_PROVIDER_SITE_OTHER): Admitting: Podiatry

## 2024-07-03 ENCOUNTER — Telehealth: Payer: Self-pay

## 2024-07-03 DIAGNOSIS — G629 Polyneuropathy, unspecified: Secondary | ICD-10-CM | POA: Diagnosis not present

## 2024-07-03 DIAGNOSIS — L600 Ingrowing nail: Secondary | ICD-10-CM | POA: Diagnosis not present

## 2024-07-03 NOTE — Telephone Encounter (Signed)
 Called and left voicemail for patient to reschedule appointment on 1/7 with Dr Ines.  If patient calls back, they can be rescheduled with Dr Vear

## 2024-07-03 NOTE — Progress Notes (Signed)
 Subjective:  Patient ID: Julie Gilbert, female    DOB: 1951-03-05,  MRN: [id number]  Chief Complaint  Patient presents with   Diabetes    Pt stated that she has a lot of burning on the bottom of her feet     73 y.o. female presents with the above complaint.  Patient presents with left hallux lateral border ingrown painful to touch is progressive and worse worse with ambulation worse with pressure patient would like to have it removed has not seen MRIs prior to seeing me for this denies any other acute complaints.  She denies any other acute complaint she also wants to discuss treatment options for neuropathy   Review of Systems: Negative except as noted in the HPI. Denies N/V/F/Ch.  Past Medical History:  Diagnosis Date   Anxiety 05/18/2018   Asthma    Chronic anemia 06/29/2016   Chronic pain of both knees 02/15/2019   CKD stage 3 due to type 2 diabetes mellitus (HCC) 05/18/2018   Coronary artery disease 10/01/2018   Diabetes mellitus without complication (HCC)    Dyslipidemia associated with type 2 diabetes mellitus (HCC) 05/18/2018   Lab Results Component Value Date  CHOL 198 08/17/2018  HDL 62.40 08/17/2018  LDLCALC 113 (H) 08/17/2018  TRIG 113.0 08/17/2018  CHOLHDL 3 08/17/2018     Gastroesophageal reflux disease without esophagitis 06/18/2015   Herniated intervertebral disc of lumbar spine 05/01/2017   Hypertension    Hypertension associated with diabetes (HCC) 05/13/2015   IBS (irritable bowel syndrome)    Insomnia 05/13/2015   Migraine headache 05/13/2015   Moderate episode of recurrent major depressive disorder (HCC) 01/04/2017   Osteoarthritis of spine 02/03/2017   Transient ischemic attack (TIA) 06/28/2016   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   Lab Results Component Value Date  HGBA1C 5.9 08/17/2018  HGBA1C 5.8 01/04/2017  HGBA1C 6.4 (H) 06/29/2016  Lab Results Component Value Date  MICROALBUR 10.3 (H) 08/17/2018  LDLCALC 113 (H) 08/17/2018   CREATININE 1.56 (H) 08/17/2018      Current Outpatient Medications:    Albuterol -Budesonide  90-80 MCG/ACT AERO, Inhale 2 puffs into the lungs daily., Disp: , Rfl:    Ascorbic Acid (VITAMIN C) 1000 MG tablet, Take 1,000 mg by mouth at bedtime., Disp: , Rfl:    aspirin  EC 81 MG tablet, Take 81 mg by mouth every morning. Swallow whole., Disp: , Rfl:    Atogepant  (QULIPTA ) 60 MG TABS, Take 1 tablet (60 mg total) by mouth daily. (Patient not taking: Reported on 07/09/2024), Disp: 30 tablet, Rfl: 11   atorvastatin  (LIPITOR) 40 MG tablet, Take 40 mg by mouth at bedtime., Disp: , Rfl:    Blood Glucose Monitoring Suppl (ACCU-CHEK AVIVA PLUS) w/Device KIT, Use as directed to check blood sugar once daily. E11.9 (Patient not taking: Reported on 07/09/2024), Disp: 1 kit, Rfl: 0   Blood Pressure Monitoring (BLOOD PRESSURE KIT) DEVI, 1 application by Does not apply route daily. Use to check blood pressure daily (Patient not taking: Reported on 07/09/2024), Disp: 1 Device, Rfl: 0   cholecalciferol  (VITAMIN D3) 25 MCG (1000 UNIT) tablet, Take 1,000 Units by mouth at bedtime., Disp: , Rfl:    COD LIVER OIL PO, Take 1-2 capsules by mouth See admin instructions. Take one capsule by mouth every morning and take two capsules at night, Disp: , Rfl:    cyclobenzaprine (FLEXERIL) 10 MG tablet, Take 10 mg by mouth 3 (three) times daily as needed. (Patient not taking: Reported on  07/09/2024), Disp: , Rfl:    escitalopram  (LEXAPRO ) 20 MG tablet, Take 20 mg by mouth daily., Disp: , Rfl:    fluticasone  (FLONASE ) 50 MCG/ACT nasal spray, PLACE 1 SPRAY INTO BOTH NOSTRILS DAILY AS NEEDED FOR ALLERGIES OR RHINITIS. (Patient not taking: Reported on 07/09/2024), Disp: 48 mL, Rfl: 3   GARLIC PO, Take 1 tablet by mouth at bedtime. (Patient not taking: Reported on 07/09/2024), Disp: , Rfl:    losartan  (COZAAR ) 25 MG tablet, TAKE 1 TABLET BY MOUTH EVERY DAY (Patient not taking: Reported on 07/09/2024), Disp: 90 tablet, Rfl: 0    losartan -hydrochlorothiazide (HYZAAR) 100-12.5 MG tablet, Take 1 tablet by mouth daily., Disp: , Rfl:    losartan -hydrochlorothiazide (HYZAAR) 50-12.5 MG tablet, Take 1 tablet by mouth daily. (Patient not taking: Reported on 07/09/2024), Disp: , Rfl:    meclizine  (ANTIVERT ) 25 MG tablet, Please take one pill at bedtime as needed then stop when prescription runs out. Please cancel any prior meclizine  prescriptions, Disp: 30 tablet, Rfl: 0   metFORMIN  (GLUCOPHAGE ) 500 MG tablet, Oral (Patient not taking: Reported on 07/09/2024), Disp: , Rfl:    Omega-3 Fatty Acids (FISH OIL ) 500 MG CAPS, Oral, Disp: , Rfl:    ondansetron  (ZOFRAN -ODT) 4 MG disintegrating tablet, Take 1-2 tablets (4-8 mg total) by mouth every 8 (eight) hours as needed., Disp: 30 tablet, Rfl: 3   pantoprazole  (PROTONIX ) 40 MG tablet, Take 40 mg by mouth every morning., Disp: , Rfl:    pregabalin (LYRICA) 50 MG capsule, 50 mg as needed. (Patient not taking: Reported on 07/09/2024), Disp: , Rfl:    pregabalin (LYRICA) 75 MG capsule, Take 75 mg by mouth 3 (three) times daily., Disp: , Rfl:    promethazine  (PHENERGAN ) 25 MG tablet, Take 25 mg by mouth every 6 (six) hours as needed., Disp: , Rfl:    Rimegepant Sulfate (NURTEC) 75 MG TBDP, Take 1 tablet (75 mg total) by mouth daily as needed., Disp: 16 tablet, Rfl: 6   rosuvastatin  (CRESTOR ) 10 MG tablet, Take 1 tablet (10 mg total) by mouth daily. (Patient not taking: Reported on 07/09/2024), Disp: 90 tablet, Rfl: 3   topiramate  (TOPAMAX ) 50 MG tablet, Take 1 tablet (50 mg total) by mouth in the morning AND 2 tablets (100 mg total) at bedtime., Disp: 270 tablet, Rfl: 3   zolpidem (AMBIEN) 10 MG tablet, Take 10 mg by mouth at bedtime., Disp: , Rfl:   Social History   Tobacco Use  Smoking Status Former   Current packs/day: 0.00   Types: Cigarettes   Quit date: 09/22/2012   Years since quitting: 11.8  Smokeless Tobacco Never    Allergies  Allergen Reactions   Peanuts [Peanut Oil]  Anaphylaxis   Shrimp [Shellfish Allergy] Anaphylaxis   Latex Hives and Other (See Comments)    latex   Elemental Sulfur Itching and Swelling   Iodinated Contrast Media Hives    Pt reported she had hives after recent procedure w/ CT contrast.    Other Other (See Comments)    Adhesive on EKG pads irritated skin   Penicillins Hives    Has patient had a PCN reaction causing immediate rash, facial/tongue/throat swelling, SOB or lightheadedness with hypotension: No  Has patient had a PCN reaction causing severe rash involving mucus membranes or skin necrosis: No  Has patient had a PCN reaction that required hospitalization: No  Has patient had a PCN reaction occurring within the last 10 years: No  If all of the above answers are NO, then  may proceed with Cephalosporin use.  Has patient had a PCN reaction causing immediate rash, facial/tongue/throat swelling, SOB or lightheadedness with hypotension: No, Has patient had a PCN reaction causing severe rash involving mucus membranes or skin necrosis: No, Has patient had a PCN reaction that required hospitalization: No, Has patient had a PCN reaction occurring within the last 10 years: No, , If all of the above answers are NO, then may proceed with Cephalosporin use.   Pineapple Swelling and Other (See Comments)    Scratchy throat, Tongue swelling   Pineapple Extract Other (See Comments) and Swelling    pineapple extract   Sulfa Antibiotics Hives   Sulfur Itching and Swelling   Evolocumab  Other (See Comments) and Palpitations    Dizzy, scratchy eyes/throat, palpitations  Other Reaction(s): dizziness  evolocumab    Objective:  There were no vitals filed for this visit. There is no height or weight on file to calculate BMI. Constitutional Well developed. Well nourished.  Vascular Dorsalis pedis pulses palpable bilaterally. Posterior tibial pulses palpable bilaterally. Capillary refill normal to all digits.  No cyanosis or clubbing  noted. Pedal hair growth normal.  Neurologic Normal speech. Oriented to person, place, and time. Epicritic sensation to light touch grossly present bilaterally.  Dermatologic Painful ingrowing nail at lateral nail borders of the hallux nail left. No other open wounds. No skin lesions.  Orthopedic: Normal joint ROM without pain or crepitus bilaterally. No visible deformities. No bony tenderness.   Radiographs: None Assessment:   1. Neuropathy   2. Ingrown left big toenail    Plan:  Patient was evaluated and treated and all questions answered.  Bilateral neuropathy All questions and concerns were discussed with the patient extensive due to given the presence of neuropathy she would benefit from seeing Dr. Lazarus for advanced care.  She states understanding - Referral for neuropathy was sent.-  Ingrown Nail, left -Patient elects to proceed with minor surgery to remove ingrown toenail removal today. Consent reviewed and signed by patient. -Ingrown nail excised. See procedure note. -Educated on post-procedure care including soaking. Written instructions provided and reviewed. -Patient to follow up in 2 weeks for nail check.  Procedure: Excision of Ingrown Toenail Location: Left 1st toe lateral nail borders. Anesthesia: Lidocaine  1% plain; 1.5 mL and Marcaine 0.5% plain; 1.5 mL, digital block. Skin Prep: Betadine. Dressing: Silvadene; telfa; dry, sterile, compression dressing. Technique: Following skin prep, the toe was exsanguinated and a tourniquet was secured at the base of the toe. The affected nail border was freed, split with a nail splitter, and excised. Chemical matrixectomy was then performed with phenol and irrigated out with alcohol. The tourniquet was then removed and sterile dressing applied. Disposition: Patient tolerated procedure well. Patient to return in 2 weeks for follow-up.   No follow-ups on file.

## 2024-07-04 NOTE — Progress Notes (Deleted)
  Cardiology Office Note:  .   Date:  07/04/2024  ID:  Julie Gilbert, DOB 12/31/1950, MRN [id number] PCP: Austin Mutton, MD  [facility] Providers Cardiologist:  Annabella Scarce, MD Electrophysiologist:  Eulas FORBES Furbish, MD {  History of Present Illness: .   Julie Gilbert is a 73 y.o. female w/PMHx of  TIA, CKD (III), DM, HTN, HLD CAD (stress test 01/2018 anteroseptal perfusion defect. LHC revealed 25-30% distal LM disease and 50% mid RCA. Minimal disease in LAD and LCx.  Recurrent syncope > ILR  She was referred to EP for recurrent syncope Saw Dr. Furbish 01/02/23, pt reported hx of multiple syncopal events since she was a child.  It seems like occur in clusters about every 7 years.  When she is experiencing episodes, they tend to occur less frequently than once a month.  She cannot identify a specific trigger, but she has had episodes occurred while she was on the toilet. The most recent was in late February of this year. A family member found her unconscious on the floor.  CPR was performed briefly, patient woke up. ILR was implanted   She saw him again 05/15/23, requesting the ILR be removed, w/apparent confusion as to why she had it/what it did. AFter further discussion on the rational for the ILR (monitoring beyond wearable monitors) given infrequent, yet recurrent syncope. She decided to leave it in.  04/23/24: ER visit for ~ 2 weeks of generalized weakness, note AKI on CKD > IVF, offered admission > pt declined  05/01/24: daughter called reporting syncopal event (on 8/4), unconscious 3 min Device clinic review ILR No device observations to explain syncope Numerous subsequent phone notes/discussion with neurology team >> perhaps ultimately preferred referral to [facility] service  Today's visit is scheduled as an annual ILR visit ROS:   *** symptoms *** all detections on?   Device information MDT ILR implanted 01/02/23 for recurrent syncope   Studies Reviewed: SABRA     EKG not done today  DEVICE interrogation done today and reviewed by myself *** Battery is good ***   TTE 12/28/22 EF 45-50%    Risk Assessment/Calculations:    Physical Exam:   VS:  LMP  (LMP Unknown) Comment: Menopausal   Wt Readings from Last 3 Encounters:  12/14/23 178 lb (80.7 kg)  05/15/23 162 lb (73.5 kg)  05/10/23 165 lb (74.8 kg)    GEN: Well nourished, well developed in no acute distress NECK: No JVD; No carotid bruits CARDIAC: ***RRR, no murmurs, rubs, gallops RESPIRATORY:  *** CTA b/l without rales, wheezing or rhonchi  ABDOMEN: Soft, non-tender, non-distended EXTREMITIES: *** No edema; No deformity   ILR site: *** is stable, no thinning, fluctuation, tethering  ASSESSMENT AND PLAN: .    *** AFib *** Secondary hypercoagulable state 2/2 AFib     {Are you ordering a CV Procedure (e.g. stress test, cath, DCCV, TEE, etc)?   Press F2        :[id number]}     Dispo: ***  Signed, Julie Macario Arthur, PA-C

## 2024-07-05 ENCOUNTER — Ambulatory Visit: Admitting: Physician Assistant

## 2024-07-09 ENCOUNTER — Encounter: Payer: Self-pay | Admitting: Cardiovascular Disease

## 2024-07-09 ENCOUNTER — Ambulatory Visit: Attending: Cardiovascular Disease | Admitting: Cardiovascular Disease

## 2024-07-09 VITALS — BP 124/88 | HR 65 | Ht 67.0 in | Wt 178.0 lb

## 2024-07-09 DIAGNOSIS — R55 Syncope and collapse: Secondary | ICD-10-CM

## 2024-07-09 NOTE — Progress Notes (Signed)
 Electrophysiology Office Note:    Date:  07/09/2024   ID:  Julie Gilbert, Julie Gilbert 1951-07-08, MRN [id number]  PCP:  Julie Mutton, MD   [facility] Providers Cardiologist:  Julie Scarce, MD Electrophysiologist:  Julie FORBES Furbish, MD     Referring MD: Julie Mutton, MD   History of Present Illness:    Julie Gilbert is a 73 y.o. female with a hx listed below, significant for non-obstructive CAD, TIA, CKD 3, DM , HTN, HLD, referred for to discuss ILR for recurrent syncope.  She has had multiple syncopal events since she was a child.  It seems like occur in clusters about every 7 years.  When she is experiencing episodes, they tend to occur less frequently than once a month.  She cannot identify a specific trigger, but she has had episodes occurred while she was on the toilet.  In February 2024, a family member found her unconscious on the floor.  CPR was performed briefly, patient woke up.  A loop recorder was placed.  There have been a few symptom activated episodes that have corresponded with sinus rhythm.  In January there was a period of brief sinus slowing consistent with a vagal episode.     EKGs/Labs/Other Studies Reviewed Today:    Echocardiogram:  TTE 12/28/22 EF 45-50%    EKG:   EKG Interpretation Date/Time:  Tuesday July 09 2024 15:26:35 EDT Ventricular Rate:  65 PR Interval:  146 QRS Duration:  86 QT Interval:  428 QTC Calculation: 445 R Axis:   -35  Text Interpretation: Normal sinus rhythm Left axis deviation Moderate voltage criteria for LVH, may be normal variant ( R in aVL , Cornell product ) ST & T wave abnormality, consider inferior ischemia ST & T wave abnormality, consider anterolateral ischemia When compared with ECG of 22-Apr-2024 21:30, No significant change was found Confirmed by [name] [phone]) on 07/09/2024 3:28:08 PM    Recent Labs: 04/22/2024: ALT 17; BUN 24; Creatinine, Ser 2.40; Hemoglobin 12.3; Platelets 183; Potassium 4.7;  Sodium 136     Physical Exam:    VS:  BP 124/88 (BP Location: Right Arm, Patient Position: Sitting, Cuff Size: Large)   Pulse 65   Ht 5' 7 (1.702 m)   Wt 178 lb (80.7 kg)   LMP  (LMP Unknown) Comment: Menopausal  SpO2 99%   BMI 27.88 kg/m     Wt Readings from Last 3 Encounters:  07/09/24 178 lb (80.7 kg)  12/14/23 178 lb (80.7 kg)  05/15/23 162 lb (73.5 kg)     GEN: Well nourished, well developed in no acute distress CARDIAC: RRR, no murmurs, rubs, gallops RESPIRATORY:  Normal work of breathing MUSCULOSKELETAL: no edema    ASSESSMENT & PLAN:    Recurrent syncope Episodes are infrequent, occurring less than once a month.  Would be unlikely to detect an episode with traditional wearable monitors. Possibly vasovagal.  She has no prodrome.   She has a loop recorder in place, monitoring She believes that she has had perhaps two episodes of syncope during the monitoring period. There have been no arrhythmia episodes correlating with these events.          Medication Adjustments/Labs and Tests Ordered: Current medicines are reviewed at length with the patient today.  Concerns regarding medicines are outlined above.  Orders Placed This Encounter  Procedures   EKG 12-Lead   No orders of the defined types were placed in this encounter.    Signed, Julie FORBES Marykatherine Sherwood,  MD  07/09/2024 3:50 PM    [facility]

## 2024-07-09 NOTE — Patient Instructions (Signed)
 Medication Instructions:  Your physician recommends that you continue on your current medications as directed. Please refer to the Current Medication list given to you today.  *If you need a refill on your cardiac medications before your next appointment, please call your pharmacy*  Lab Work: None ordered.  If you have labs (blood work) drawn today and your tests are completely normal, you will receive your results only by: MyChart Message (if you have MyChart) OR A paper copy in the mail If you have any lab test that is abnormal or we need to change your treatment, we will call you to review the results.  Testing/Procedures: None ordered.   Follow-Up: At [facility], you and your health needs are our priority.  As part of our continuing mission to provide you with exceptional heart care, our providers are all part of one team.  This team includes your primary Cardiologist (physician) and Advanced Practice Providers or APPs (Physician Assistants and Nurse Practitioners) who all work together to provide you with the care you need, when you need it.  Your next appointment:   2 years with Dr Mealor

## 2024-07-22 ENCOUNTER — Ambulatory Visit
Admission: RE | Admit: 2024-07-22 | Discharge: 2024-07-22 | Disposition: A | Discharge status: Home / Self Care | Source: Ambulatory Visit | Attending: Family Medicine | Admitting: Family Medicine

## 2024-07-22 DIAGNOSIS — Z1231 Encounter for screening mammogram for malignant neoplasm of breast: Secondary | ICD-10-CM

## 2024-07-29 ENCOUNTER — Ambulatory Visit

## 2024-07-29 ENCOUNTER — Encounter

## 2024-07-29 DIAGNOSIS — R55 Syncope and collapse: Secondary | ICD-10-CM | POA: Diagnosis not present

## 2024-07-29 LAB — CUP PACEART REMOTE DEVICE CHECK
Date Time Interrogation Session: 20251102230651
Implantable Pulse Generator Implant Date: 20240408

## 2024-08-01 ENCOUNTER — Ambulatory Visit: Payer: Self-pay | Admitting: Cardiovascular Disease

## 2024-08-01 NOTE — Progress Notes (Signed)
 Remote Loop Recorder Transmission

## 2024-08-29 ENCOUNTER — Encounter

## 2024-08-29 ENCOUNTER — Ambulatory Visit: Payer: Self-pay | Admitting: Cardiovascular Disease

## 2024-08-29 ENCOUNTER — Ambulatory Visit

## 2024-08-29 DIAGNOSIS — R55 Syncope and collapse: Secondary | ICD-10-CM | POA: Diagnosis not present

## 2024-08-29 LAB — CUP PACEART REMOTE DEVICE CHECK
Date Time Interrogation Session: 20251203230630
Implantable Pulse Generator Implant Date: 20240408

## 2024-08-30 NOTE — Progress Notes (Signed)
 Remote Loop Recorder Transmission

## 2024-09-05 ENCOUNTER — Encounter: Payer: Self-pay | Admitting: Family Medicine

## 2024-09-05 ENCOUNTER — Ambulatory Visit: Admitting: Family Medicine

## 2024-09-05 ENCOUNTER — Other Ambulatory Visit: Payer: Self-pay

## 2024-09-05 ENCOUNTER — Ambulatory Visit

## 2024-09-05 VITALS — BP 138/88 | HR 60 | Ht 67.0 in | Wt 184.0 lb

## 2024-09-05 DIAGNOSIS — G8929 Other chronic pain: Secondary | ICD-10-CM

## 2024-09-05 DIAGNOSIS — R29898 Other symptoms and signs involving the musculoskeletal system: Secondary | ICD-10-CM

## 2024-09-05 DIAGNOSIS — M5441 Lumbago with sciatica, right side: Secondary | ICD-10-CM

## 2024-09-05 DIAGNOSIS — M545 Low back pain, unspecified: Secondary | ICD-10-CM | POA: Diagnosis not present

## 2024-09-05 DIAGNOSIS — M25551 Pain in right hip: Secondary | ICD-10-CM | POA: Diagnosis not present

## 2024-09-05 MED ORDER — PREDNISONE 50 MG PO TABS: 50.0000 mg | ORAL_TABLET | Freq: Every day | ORAL | 0 refills | Status: AC

## 2024-09-05 MED ORDER — GABAPENTIN 300 MG PO CAPS: 300.0000 mg | ORAL_CAPSULE | Freq: Three times a day (TID) | ORAL | 2 refills | Status: AC | PRN

## 2024-09-05 NOTE — Patient Instructions (Addendum)
 Thank you for coming in today.   You received an injection today. Seek immediate medical attention if the joint becomes red, extremely painful, or is oozing fluid.   Please get an Xray today before you leave   Prescription sent in for Prednisone  and Gabapentin , start Prednisone  tomorrow.   STAT MRI ordered to [facility] in [place].   See you back for MRI review.

## 2024-09-05 NOTE — Progress Notes (Signed)
 I, Leotis Batter, CMA acting as a scribe for Artist Lloyd, MD.  Julie Gilbert is a 73 y.o. female who presents to [facility] today for evaluation of right hip pain. Pt was last seen by Dr. Lloyd on 04/05/22 for lumbar radiculopathy.   Today, pt reports new onset right leg pain.  Pain is located in the lateral hip and radiates down the leg to the lateral calf.  Pain is severe and ongoing for the last week or so.  She notes some weakness but cannot pin it down or tell me exactly when it happened.  No bowel or bladder dysfunction.  She is using a cane to ambulate.   Pertinent review of systems: No fevers or chills  Relevant historical information: Hypertension TIA diabetes lumbar radiculopathy.   Exam:  BP 138/88   Pulse 60   Ht 5' 7 (1.702 m)   Wt 184 lb (83.5 kg)   LMP  (LMP Unknown) Comment: Menopausal  SpO2 98%   BMI 28.82 kg/m  General: Well Developed, well nourished, and in no acute distress.   MSK: L-spine decreased lumbar motion. Lower extremity strength is decreased right hip flexion rated 4/5.  Hip abduction rated 4/5.  Knee extension 5/5.  Foot dorsiflexion 4/5.  Foot plantarflexion 4/5.  Knee flexion 5/5.  Left leg lower extremity strength is intact.  Right hip normal-appearing tender palpation greater trochanter.  Hip abduction strength is diminished.    Lab and Radiology Results  Procedure: Real-time Ultrasound Guided Injection of right lateral hip greater trochanter bursa Device: Philips Affiniti 50G/GE Logiq Images permanently stored and available for review in PACS Verbal informed consent obtained.  Discussed risks and benefits of procedure. Warned about infection, bleeding, hyperglycemia damage to structures among others. Patient expresses understanding and agreement Time-out conducted.   Noted no overlying erythema, induration, or other signs of local infection.   Skin prepped in a sterile fashion.   Local anesthesia:  Topical Ethyl chloride.   With sterile technique and under real time ultrasound guidance: 40 mg of Kenalog and 2 mL of Marcaine injected into greater trochanter bursa. Fluid seen entering the bursa.   Completed without difficulty   Pain immediately resolved suggesting accurate placement of the medication.   Advised to call if fevers/chills, erythema, induration, drainage, or persistent bleeding.   Images permanently stored and available for review in the ultrasound unit.  Impression: Technically successful ultrasound guided injection.   X-ray images lumbar spine and right hip obtained today personally and independently interpreted.  Lumbar spine: DDD L5-S1.  No acute fractures are visible.  Right hip: Mild DJD.  No acute fractures.  Await formal radiology review    Assessment and Plan: 73 y.o. female with right lateral hip pain with pain radiating down the leg.  Pain is most consistent with right L5 lumbar radiculopathy.  She also has some coexistence with greater trochanteric bursitis. Plan for greater trochanter injection x-rays and MRI lumbar spine.  Based on her lower extremity weakness I am concerned about worsening lumbar radicular symptoms.  Plan for relatively quick lumbar spine MRI.  Prednisone  and gabapentin  prescribed.  Anticipate epidural steroid injection or neurosurgical consultation based on results.   PDMP not reviewed this encounter. Orders Placed This Encounter  Procedures   US  LIMITED JOINT SPACE STRUCTURES LOW RIGHT(NO LINKED CHARGES)    Reason for Exam (SYMPTOM  OR DIAGNOSIS REQUIRED):   right hip pain    Preferred imaging location?:   [facility]   DG Lumbar Spine 2-3 Views    Standing Status:   Future    Number of Occurrences:   1    Expiration Date:   10/06/2024    Reason for Exam (SYMPTOM  OR DIAGNOSIS REQUIRED):   low back and right hip pain    Preferred imaging location?:   [facility]   DG HIP UNILAT W OR W/O PELVIS 2-3  VIEWS RIGHT    Standing Status:   Future    Number of Occurrences:   1    Expiration Date:   10/06/2024    Reason for Exam (SYMPTOM  OR DIAGNOSIS REQUIRED):   low back and right hip pain    Preferred imaging location?:   [facility]   MR LUMBAR SPINE WO CONTRAST    Standing Status:   Future    Expiration Date:   09/05/2025    What is the patient's sedation requirement?:   No Sedation    Does the patient have a pacemaker or implanted devices?:   No    Preferred imaging location?:   [facility] (table limit-350lbs)   Meds ordered this encounter  Medications   gabapentin  (NEURONTIN ) 300 MG capsule    Sig: Take 1 capsule (300 mg total) by mouth 3 (three) times daily as needed.    Dispense:  90 capsule    Refill:  2   predniSONE  (DELTASONE ) 50 MG tablet    Sig: Take 1 tablet (50 mg total) by mouth daily with breakfast for 5 days.    Dispense:  5 tablet    Refill:  0     Discussed warning signs or symptoms. Please see discharge instructions. Patient expresses understanding.   The above documentation has been reviewed and is accurate and complete Artist Lloyd, M.D.

## 2024-09-12 ENCOUNTER — Ambulatory Visit: Payer: Self-pay | Admitting: Family Medicine

## 2024-09-12 NOTE — Progress Notes (Signed)
 Right hip x-ray shows some arthritis in the hip joints.  No broken bones are visible.

## 2024-09-16 NOTE — Progress Notes (Signed)
 Low back x-ray shows mild arthritis

## 2024-09-29 ENCOUNTER — Ambulatory Visit: Payer: Self-pay | Attending: Cardiovascular Disease

## 2024-09-29 DIAGNOSIS — R55 Syncope and collapse: Secondary | ICD-10-CM | POA: Diagnosis not present

## 2024-09-30 ENCOUNTER — Encounter

## 2024-09-30 LAB — CUP PACEART REMOTE DEVICE CHECK
Date Time Interrogation Session: 20260103230928
Implantable Pulse Generator Implant Date: 20240408

## 2024-10-02 ENCOUNTER — Ambulatory Visit: Admitting: Neurology

## 2024-10-03 NOTE — Progress Notes (Signed)
 Remote Loop Recorder Transmission

## 2024-10-05 ENCOUNTER — Ambulatory Visit: Payer: Self-pay | Admitting: Cardiovascular Disease

## 2024-10-06 ENCOUNTER — Other Ambulatory Visit

## 2024-10-30 ENCOUNTER — Ambulatory Visit: Payer: Self-pay

## 2024-10-30 LAB — CUP PACEART REMOTE DEVICE CHECK
Date Time Interrogation Session: 20260203231601
Implantable Pulse Generator Implant Date: 20240408

## 2024-10-31 ENCOUNTER — Encounter

## 2024-11-30 ENCOUNTER — Ambulatory Visit: Payer: Self-pay

## 2024-12-02 ENCOUNTER — Encounter

## 2024-12-31 ENCOUNTER — Ambulatory Visit: Payer: Self-pay

## 2025-01-02 ENCOUNTER — Encounter

## 2025-02-03 ENCOUNTER — Ambulatory Visit (HOSPITAL_BASED_OUTPATIENT_CLINIC_OR_DEPARTMENT_OTHER): Admitting: Cardiovascular Disease
